# Patient Record
Sex: Female | Born: 1940
Health system: Southern US, Community
[De-identification: ages and names within clinical notes are randomized; demographics above are authoritative.]

## PROBLEM LIST (undated history)

## (undated) DIAGNOSIS — F419 Anxiety disorder, unspecified: Secondary | ICD-10-CM

## (undated) DIAGNOSIS — M1990 Unspecified osteoarthritis, unspecified site: Secondary | ICD-10-CM

## (undated) DIAGNOSIS — K5790 Diverticulosis of intestine, part unspecified, without perforation or abscess without bleeding: Secondary | ICD-10-CM

## (undated) DIAGNOSIS — Z87442 Personal history of urinary calculi: Secondary | ICD-10-CM

## (undated) DIAGNOSIS — C801 Malignant (primary) neoplasm, unspecified: Secondary | ICD-10-CM

## (undated) DIAGNOSIS — J45909 Unspecified asthma, uncomplicated: Secondary | ICD-10-CM

## (undated) DIAGNOSIS — E785 Hyperlipidemia, unspecified: Secondary | ICD-10-CM

## (undated) DIAGNOSIS — K219 Gastro-esophageal reflux disease without esophagitis: Secondary | ICD-10-CM

## (undated) DIAGNOSIS — R7303 Prediabetes: Secondary | ICD-10-CM

## (undated) DIAGNOSIS — I10 Essential (primary) hypertension: Secondary | ICD-10-CM

## (undated) HISTORY — PX: CATARACT EXTRACTION W/ INTRAOCULAR LENS IMPLANT: SHX1309

## (undated) HISTORY — PX: TONSILLECTOMY: SUR1361

---

## 1985-10-16 HISTORY — PX: TUBAL LIGATION: SHX77

## 1998-10-19 HISTORY — PX: EYE SURGERY: SHX253

## 1999-01-11 HISTORY — PX: SIGMOIDOSCOPY: SUR1295

## 1999-08-14 ENCOUNTER — Encounter: Admission: RE | Admit: 1999-08-14 | Discharge: 1999-08-14 | Payer: Self-pay | Admitting: Unknown Physician Specialty

## 1999-08-14 ENCOUNTER — Encounter: Payer: Self-pay | Admitting: Unknown Physician Specialty

## 2000-08-26 ENCOUNTER — Encounter: Admission: RE | Admit: 2000-08-26 | Discharge: 2000-08-26 | Payer: Self-pay | Admitting: Unknown Physician Specialty

## 2000-08-26 ENCOUNTER — Encounter: Payer: Self-pay | Admitting: Unknown Physician Specialty

## 2001-08-26 ENCOUNTER — Encounter: Payer: Self-pay | Admitting: Unknown Physician Specialty

## 2001-08-26 ENCOUNTER — Encounter: Admission: RE | Admit: 2001-08-26 | Discharge: 2001-08-26 | Payer: Self-pay | Admitting: Unknown Physician Specialty

## 2004-03-23 ENCOUNTER — Other Ambulatory Visit: Admission: RE | Admit: 2004-03-23 | Discharge: 2004-03-23 | Payer: Self-pay | Admitting: Family Medicine

## 2005-03-28 ENCOUNTER — Other Ambulatory Visit: Admission: RE | Admit: 2005-03-28 | Discharge: 2005-03-28 | Payer: Self-pay | Admitting: Family Medicine

## 2006-06-07 ENCOUNTER — Other Ambulatory Visit: Admission: RE | Admit: 2006-06-07 | Discharge: 2006-06-07 | Payer: Self-pay | Admitting: Family Medicine

## 2010-12-25 HISTORY — PX: TRIGGER FINGER RELEASE: SHX641

## 2011-09-18 DIAGNOSIS — L821 Other seborrheic keratosis: Secondary | ICD-10-CM | POA: Diagnosis not present

## 2011-09-18 DIAGNOSIS — L570 Actinic keratosis: Secondary | ICD-10-CM | POA: Diagnosis not present

## 2011-12-11 DIAGNOSIS — N6489 Other specified disorders of breast: Secondary | ICD-10-CM | POA: Diagnosis not present

## 2011-12-11 DIAGNOSIS — Z1231 Encounter for screening mammogram for malignant neoplasm of breast: Secondary | ICD-10-CM | POA: Diagnosis not present

## 2011-12-12 DIAGNOSIS — N6489 Other specified disorders of breast: Secondary | ICD-10-CM | POA: Diagnosis not present

## 2011-12-12 DIAGNOSIS — Z1231 Encounter for screening mammogram for malignant neoplasm of breast: Secondary | ICD-10-CM | POA: Diagnosis not present

## 2011-12-18 DIAGNOSIS — B078 Other viral warts: Secondary | ICD-10-CM | POA: Diagnosis not present

## 2011-12-18 DIAGNOSIS — L819 Disorder of pigmentation, unspecified: Secondary | ICD-10-CM | POA: Diagnosis not present

## 2011-12-18 DIAGNOSIS — L578 Other skin changes due to chronic exposure to nonionizing radiation: Secondary | ICD-10-CM | POA: Diagnosis not present

## 2011-12-18 DIAGNOSIS — L821 Other seborrheic keratosis: Secondary | ICD-10-CM | POA: Diagnosis not present

## 2012-02-05 DIAGNOSIS — J309 Allergic rhinitis, unspecified: Secondary | ICD-10-CM | POA: Diagnosis not present

## 2012-02-05 DIAGNOSIS — M810 Age-related osteoporosis without current pathological fracture: Secondary | ICD-10-CM | POA: Diagnosis not present

## 2012-02-05 DIAGNOSIS — R143 Flatulence: Secondary | ICD-10-CM | POA: Diagnosis not present

## 2012-02-05 DIAGNOSIS — I10 Essential (primary) hypertension: Secondary | ICD-10-CM | POA: Diagnosis not present

## 2012-02-05 DIAGNOSIS — R141 Gas pain: Secondary | ICD-10-CM | POA: Diagnosis not present

## 2012-02-05 DIAGNOSIS — E559 Vitamin D deficiency, unspecified: Secondary | ICD-10-CM | POA: Diagnosis not present

## 2012-02-05 DIAGNOSIS — Z79899 Other long term (current) drug therapy: Secondary | ICD-10-CM | POA: Diagnosis not present

## 2012-02-05 DIAGNOSIS — Z0000 Encounter for general adult medical examination without abnormal findings: Secondary | ICD-10-CM | POA: Diagnosis not present

## 2012-02-05 DIAGNOSIS — E785 Hyperlipidemia, unspecified: Secondary | ICD-10-CM | POA: Diagnosis not present

## 2012-03-06 HISTORY — PX: COLONOSCOPY: SHX174

## 2012-03-11 DIAGNOSIS — M810 Age-related osteoporosis without current pathological fracture: Secondary | ICD-10-CM | POA: Diagnosis not present

## 2012-04-02 DIAGNOSIS — K5730 Diverticulosis of large intestine without perforation or abscess without bleeding: Secondary | ICD-10-CM | POA: Diagnosis not present

## 2012-04-02 DIAGNOSIS — Z1211 Encounter for screening for malignant neoplasm of colon: Secondary | ICD-10-CM | POA: Diagnosis not present

## 2012-04-28 DIAGNOSIS — Z23 Encounter for immunization: Secondary | ICD-10-CM | POA: Diagnosis not present

## 2012-06-03 DIAGNOSIS — M25529 Pain in unspecified elbow: Secondary | ICD-10-CM | POA: Diagnosis not present

## 2012-06-03 DIAGNOSIS — M6530 Trigger finger, unspecified finger: Secondary | ICD-10-CM | POA: Diagnosis not present

## 2012-06-06 HISTORY — PX: BREAST BIOPSY: SHX20

## 2012-06-10 DIAGNOSIS — Z09 Encounter for follow-up examination after completed treatment for conditions other than malignant neoplasm: Secondary | ICD-10-CM | POA: Diagnosis not present

## 2012-06-10 DIAGNOSIS — N63 Unspecified lump in unspecified breast: Secondary | ICD-10-CM | POA: Diagnosis not present

## 2012-06-10 DIAGNOSIS — Z0189 Encounter for other specified special examinations: Secondary | ICD-10-CM | POA: Diagnosis not present

## 2012-06-11 ENCOUNTER — Other Ambulatory Visit: Payer: Self-pay | Admitting: Radiology

## 2012-06-11 DIAGNOSIS — Z09 Encounter for follow-up examination after completed treatment for conditions other than malignant neoplasm: Secondary | ICD-10-CM | POA: Diagnosis not present

## 2012-06-11 DIAGNOSIS — N63 Unspecified lump in unspecified breast: Secondary | ICD-10-CM | POA: Diagnosis not present

## 2012-06-11 DIAGNOSIS — Z0189 Encounter for other specified special examinations: Secondary | ICD-10-CM | POA: Diagnosis not present

## 2012-06-11 DIAGNOSIS — N6019 Diffuse cystic mastopathy of unspecified breast: Secondary | ICD-10-CM | POA: Diagnosis not present

## 2012-06-24 DIAGNOSIS — L821 Other seborrheic keratosis: Secondary | ICD-10-CM | POA: Diagnosis not present

## 2012-06-24 DIAGNOSIS — L820 Inflamed seborrheic keratosis: Secondary | ICD-10-CM | POA: Diagnosis not present

## 2012-06-24 DIAGNOSIS — L819 Disorder of pigmentation, unspecified: Secondary | ICD-10-CM | POA: Diagnosis not present

## 2012-06-24 DIAGNOSIS — L578 Other skin changes due to chronic exposure to nonionizing radiation: Secondary | ICD-10-CM | POA: Diagnosis not present

## 2012-08-05 DIAGNOSIS — M7710 Lateral epicondylitis, unspecified elbow: Secondary | ICD-10-CM | POA: Diagnosis not present

## 2012-08-12 DIAGNOSIS — L570 Actinic keratosis: Secondary | ICD-10-CM | POA: Diagnosis not present

## 2012-08-26 DIAGNOSIS — L570 Actinic keratosis: Secondary | ICD-10-CM | POA: Diagnosis not present

## 2012-09-09 DIAGNOSIS — L578 Other skin changes due to chronic exposure to nonionizing radiation: Secondary | ICD-10-CM | POA: Diagnosis not present

## 2012-09-09 DIAGNOSIS — D239 Other benign neoplasm of skin, unspecified: Secondary | ICD-10-CM | POA: Diagnosis not present

## 2012-09-09 DIAGNOSIS — D485 Neoplasm of uncertain behavior of skin: Secondary | ICD-10-CM | POA: Diagnosis not present

## 2012-09-09 DIAGNOSIS — L570 Actinic keratosis: Secondary | ICD-10-CM | POA: Diagnosis not present

## 2012-09-09 DIAGNOSIS — D1801 Hemangioma of skin and subcutaneous tissue: Secondary | ICD-10-CM | POA: Diagnosis not present

## 2012-09-14 ENCOUNTER — Encounter (HOSPITAL_COMMUNITY): Payer: Self-pay | Admitting: Emergency Medicine

## 2012-09-14 ENCOUNTER — Emergency Department (HOSPITAL_COMMUNITY): Payer: Medicare Other

## 2012-09-14 ENCOUNTER — Inpatient Hospital Stay (HOSPITAL_COMMUNITY)
Admission: EM | Admit: 2012-09-14 | Discharge: 2012-09-27 | DRG: 329 | Disposition: A | Discharge status: Home Health | Payer: Medicare Other | Attending: Surgery | Admitting: Surgery

## 2012-09-14 DIAGNOSIS — Z433 Encounter for attention to colostomy: Secondary | ICD-10-CM | POA: Diagnosis not present

## 2012-09-14 DIAGNOSIS — K5730 Diverticulosis of large intestine without perforation or abscess without bleeding: Secondary | ICD-10-CM | POA: Diagnosis not present

## 2012-09-14 DIAGNOSIS — R112 Nausea with vomiting, unspecified: Secondary | ICD-10-CM | POA: Diagnosis present

## 2012-09-14 DIAGNOSIS — D175 Benign lipomatous neoplasm of intra-abdominal organs: Secondary | ICD-10-CM | POA: Diagnosis not present

## 2012-09-14 DIAGNOSIS — K651 Peritoneal abscess: Secondary | ICD-10-CM | POA: Diagnosis not present

## 2012-09-14 DIAGNOSIS — Z79899 Other long term (current) drug therapy: Secondary | ICD-10-CM

## 2012-09-14 DIAGNOSIS — K631 Perforation of intestine (nontraumatic): Secondary | ICD-10-CM | POA: Diagnosis not present

## 2012-09-14 DIAGNOSIS — E785 Hyperlipidemia, unspecified: Secondary | ICD-10-CM | POA: Diagnosis not present

## 2012-09-14 DIAGNOSIS — I10 Essential (primary) hypertension: Secondary | ICD-10-CM | POA: Diagnosis present

## 2012-09-14 DIAGNOSIS — K2970 Gastritis, unspecified, without bleeding: Secondary | ICD-10-CM | POA: Diagnosis not present

## 2012-09-14 DIAGNOSIS — K6389 Other specified diseases of intestine: Secondary | ICD-10-CM

## 2012-09-14 DIAGNOSIS — R109 Unspecified abdominal pain: Secondary | ICD-10-CM | POA: Diagnosis not present

## 2012-09-14 DIAGNOSIS — K650 Generalized (acute) peritonitis: Secondary | ICD-10-CM | POA: Diagnosis present

## 2012-09-14 DIAGNOSIS — R9389 Abnormal findings on diagnostic imaging of other specified body structures: Secondary | ICD-10-CM | POA: Diagnosis present

## 2012-09-14 DIAGNOSIS — E875 Hyperkalemia: Secondary | ICD-10-CM | POA: Diagnosis not present

## 2012-09-14 DIAGNOSIS — K630 Abscess of intestine: Secondary | ICD-10-CM | POA: Diagnosis not present

## 2012-09-14 DIAGNOSIS — K5732 Diverticulitis of large intestine without perforation or abscess without bleeding: Secondary | ICD-10-CM | POA: Diagnosis not present

## 2012-09-14 DIAGNOSIS — R5381 Other malaise: Secondary | ICD-10-CM | POA: Diagnosis not present

## 2012-09-14 DIAGNOSIS — Z4801 Encounter for change or removal of surgical wound dressing: Secondary | ICD-10-CM | POA: Diagnosis not present

## 2012-09-14 DIAGNOSIS — R935 Abnormal findings on diagnostic imaging of other abdominal regions, including retroperitoneum: Secondary | ICD-10-CM | POA: Diagnosis not present

## 2012-09-14 DIAGNOSIS — L03319 Cellulitis of trunk, unspecified: Secondary | ICD-10-CM | POA: Diagnosis not present

## 2012-09-14 DIAGNOSIS — R1084 Generalized abdominal pain: Secondary | ICD-10-CM | POA: Diagnosis not present

## 2012-09-14 DIAGNOSIS — K56609 Unspecified intestinal obstruction, unspecified as to partial versus complete obstruction: Secondary | ICD-10-CM | POA: Diagnosis not present

## 2012-09-14 DIAGNOSIS — K5792 Diverticulitis of intestine, part unspecified, without perforation or abscess without bleeding: Secondary | ICD-10-CM | POA: Diagnosis present

## 2012-09-14 DIAGNOSIS — E669 Obesity, unspecified: Secondary | ICD-10-CM | POA: Diagnosis not present

## 2012-09-14 DIAGNOSIS — K639 Disease of intestine, unspecified: Secondary | ICD-10-CM | POA: Diagnosis present

## 2012-09-14 DIAGNOSIS — G8928 Other chronic postprocedural pain: Secondary | ICD-10-CM | POA: Diagnosis not present

## 2012-09-14 HISTORY — DX: Hyperlipidemia, unspecified: E78.5

## 2012-09-14 HISTORY — DX: Diverticulosis of intestine, part unspecified, without perforation or abscess without bleeding: K57.90

## 2012-09-14 HISTORY — DX: Essential (primary) hypertension: I10

## 2012-09-14 LAB — CBC WITH DIFFERENTIAL/PLATELET
Eosinophils Relative: 0 % (ref 0–5)
HCT: 41 % (ref 36.0–46.0)
Lymphocytes Relative: 6 % — ABNORMAL LOW (ref 12–46)
Lymphs Abs: 0.9 10*3/uL (ref 0.7–4.0)
MCH: 31.7 pg (ref 26.0–34.0)
MCV: 92.1 fL (ref 78.0–100.0)
Monocytes Absolute: 0.9 10*3/uL (ref 0.1–1.0)
RBC: 4.45 MIL/uL (ref 3.87–5.11)
RDW: 12.8 % (ref 11.5–15.5)
WBC: 15.1 10*3/uL — ABNORMAL HIGH (ref 4.0–10.5)

## 2012-09-14 LAB — COMPREHENSIVE METABOLIC PANEL
BUN: 15 mg/dL (ref 6–23)
CO2: 22 mEq/L (ref 19–32)
Chloride: 102 mEq/L (ref 96–112)
Creatinine, Ser: 0.77 mg/dL (ref 0.50–1.10)
GFR calc non Af Amer: 83 mL/min — ABNORMAL LOW (ref >90)
Glucose, Bld: 161 mg/dL — ABNORMAL HIGH (ref 70–99)
Total Bilirubin: 0.5 mg/dL (ref 0.3–1.2)

## 2012-09-14 LAB — URINALYSIS, ROUTINE W REFLEX MICROSCOPIC
Bilirubin Urine: NEGATIVE
Glucose, UA: NEGATIVE mg/dL
Ketones, ur: 15 mg/dL — AB
pH: 8 (ref 5.0–8.0)

## 2012-09-14 LAB — URINALYSIS, MICROSCOPIC ONLY
Glucose, UA: NEGATIVE mg/dL
Ketones, ur: 15 mg/dL — AB
Leukocytes, UA: NEGATIVE
Nitrite: NEGATIVE
Protein, ur: NEGATIVE mg/dL

## 2012-09-14 LAB — LIPASE, BLOOD: Lipase: 20 U/L (ref 11–59)

## 2012-09-14 MED ORDER — ONDANSETRON HCL 4 MG/2ML IJ SOLN
4.0000 mg | Freq: Once | INTRAMUSCULAR | Status: AC
Start: 2012-09-14 — End: 2012-09-14
  Administered 2012-09-14: 4 mg via INTRAVENOUS
  Filled 2012-09-14: qty 2

## 2012-09-14 MED ORDER — ALPRAZOLAM 0.25 MG PO TABS
0.1250 mg | ORAL_TABLET | Freq: Every day | ORAL | Status: DC | PRN
Start: 2012-09-14 — End: 2012-09-22
  Administered 2012-09-21 – 2012-09-22 (×2): 0.25 mg via ORAL
  Filled 2012-09-14 (×2): qty 1

## 2012-09-14 MED ORDER — ALUM & MAG HYDROXIDE-SIMETH 200-200-20 MG/5ML PO SUSP
30.0000 mL | Freq: Four times a day (QID) | ORAL | Status: DC | PRN
Start: 2012-09-14 — End: 2012-09-27
  Administered 2012-09-15 – 2012-09-16 (×3): 30 mL via ORAL
  Filled 2012-09-14 (×3): qty 30

## 2012-09-14 MED ORDER — ACETAMINOPHEN 650 MG RE SUPP: 650.0000 mg | Freq: Four times a day (QID) | RECTAL | Status: DC | PRN

## 2012-09-14 MED ORDER — AMLODIPINE BESYLATE 5 MG PO TABS
5.0000 mg | ORAL_TABLET | Freq: Every morning | ORAL | Status: DC
  Administered 2012-09-15 – 2012-09-16 (×2): 5 mg via ORAL
  Filled 2012-09-14 (×3): qty 1

## 2012-09-14 MED ORDER — LIP MEDEX EX OINT
TOPICAL_OINTMENT | CUTANEOUS | Status: DC | PRN
  Filled 2012-09-14 (×3): qty 7

## 2012-09-14 MED ORDER — IOHEXOL 300 MG/ML  SOLN
50.0000 mL | Freq: Once | INTRAMUSCULAR | Status: AC | PRN
  Administered 2012-09-14: 50 mL via ORAL

## 2012-09-14 MED ORDER — ADULT MULTIVITAMIN W/MINERALS CH
1.0000 | ORAL_TABLET | Freq: Every morning | ORAL | Status: DC
  Administered 2012-09-15 – 2012-09-16 (×2): 1 via ORAL
  Filled 2012-09-14 (×3): qty 1

## 2012-09-14 MED ORDER — SODIUM CHLORIDE 0.9 % IV SOLN
Freq: Once | INTRAVENOUS | Status: AC
  Administered 2012-09-14: 20:00:00 via INTRAVENOUS

## 2012-09-14 MED ORDER — ONDANSETRON HCL 4 MG PO TABS: 4.0000 mg | ORAL_TABLET | Freq: Four times a day (QID) | ORAL | Status: DC | PRN

## 2012-09-14 MED ORDER — ONDANSETRON HCL 4 MG/2ML IJ SOLN
4.0000 mg | Freq: Four times a day (QID) | INTRAMUSCULAR | Status: DC | PRN
  Administered 2012-09-15 – 2012-09-16 (×4): 4 mg via INTRAVENOUS
  Filled 2012-09-14 (×3): qty 2

## 2012-09-14 MED ORDER — HEPARIN SODIUM (PORCINE) 5000 UNIT/ML IJ SOLN
5000.0000 [IU] | Freq: Three times a day (TID) | INTRAMUSCULAR | Status: DC
  Administered 2012-09-14 – 2012-09-19 (×15): 5000 [IU] via SUBCUTANEOUS
  Filled 2012-09-14 (×17): qty 1

## 2012-09-14 MED ORDER — OXYCODONE HCL 5 MG PO TABS: 5.0000 mg | ORAL_TABLET | ORAL | Status: DC | PRN

## 2012-09-14 MED ORDER — SIMVASTATIN 20 MG PO TABS
20.0000 mg | ORAL_TABLET | Freq: Every evening | ORAL | Status: DC
  Administered 2012-09-14 – 2012-09-16 (×3): 20 mg via ORAL
  Filled 2012-09-14 (×4): qty 1

## 2012-09-14 MED ORDER — CIPROFLOXACIN IN D5W 400 MG/200ML IV SOLN
400.0000 mg | Freq: Two times a day (BID) | INTRAVENOUS | Status: DC
  Administered 2012-09-14 – 2012-09-16 (×4): 400 mg via INTRAVENOUS
  Filled 2012-09-14 (×6): qty 200

## 2012-09-14 MED ORDER — ASPIRIN EC 81 MG PO TBEC
81.0000 mg | DELAYED_RELEASE_TABLET | Freq: Every morning | ORAL | Status: DC
  Administered 2012-09-15 – 2012-09-16 (×2): 81 mg via ORAL
  Filled 2012-09-14 (×3): qty 1

## 2012-09-14 MED ORDER — ONDANSETRON HCL 4 MG/2ML IJ SOLN
4.0000 mg | Freq: Four times a day (QID) | INTRAMUSCULAR | Status: DC | PRN
  Administered 2012-09-20 – 2012-09-25 (×3): 4 mg via INTRAVENOUS
  Filled 2012-09-14 (×12): qty 2

## 2012-09-14 MED ORDER — HYDROMORPHONE HCL PF 1 MG/ML IJ SOLN
1.0000 mg | INTRAMUSCULAR | Status: DC | PRN
  Administered 2012-09-14 – 2012-09-17 (×13): 1 mg via INTRAVENOUS
  Filled 2012-09-14 (×13): qty 1

## 2012-09-14 MED ORDER — ACETAMINOPHEN 325 MG PO TABS
650.0000 mg | ORAL_TABLET | Freq: Four times a day (QID) | ORAL | Status: DC | PRN
  Administered 2012-09-21 – 2012-09-25 (×3): 650 mg via ORAL
  Filled 2012-09-14 (×3): qty 2

## 2012-09-14 MED ORDER — FENTANYL CITRATE 0.05 MG/ML IJ SOLN
50.0000 ug | Freq: Once | INTRAMUSCULAR | Status: AC
  Administered 2012-09-14: 50 ug via INTRAVENOUS
  Filled 2012-09-14: qty 2

## 2012-09-14 MED ORDER — IOHEXOL 300 MG/ML  SOLN
100.0000 mL | Freq: Once | INTRAMUSCULAR | Status: AC | PRN
  Administered 2012-09-14: 100 mL via INTRAVENOUS

## 2012-09-14 MED ORDER — MORPHINE SULFATE 2 MG/ML IJ SOLN
1.0000 mg | INTRAMUSCULAR | Status: DC | PRN
  Administered 2012-09-15 – 2012-09-19 (×2): 2 mg via INTRAVENOUS
  Filled 2012-09-14 (×2): qty 1

## 2012-09-14 MED ORDER — METRONIDAZOLE IN NACL 5-0.79 MG/ML-% IV SOLN
500.0000 mg | Freq: Three times a day (TID) | INTRAVENOUS | Status: DC
  Administered 2012-09-14 – 2012-09-16 (×6): 500 mg via INTRAVENOUS
  Filled 2012-09-14 (×7): qty 100

## 2012-09-14 MED ORDER — SODIUM CHLORIDE 0.9 % IV BOLUS (SEPSIS)
1000.0000 mL | Freq: Once | INTRAVENOUS | Status: AC
  Administered 2012-09-14: 1000 mL via INTRAVENOUS

## 2012-09-14 MED ORDER — RISAQUAD PO CAPS
1.0000 | ORAL_CAPSULE | Freq: Every evening | ORAL | Status: DC
  Administered 2012-09-14 – 2012-09-16 (×3): 1 via ORAL
  Filled 2012-09-14 (×4): qty 1

## 2012-09-14 MED ORDER — POTASSIUM CHLORIDE IN NACL 20-0.9 MEQ/L-% IV SOLN
INTRAVENOUS | Status: DC
  Administered 2012-09-14: 22:00:00 via INTRAVENOUS
  Administered 2012-09-15: 75 mL/h via INTRAVENOUS
  Filled 2012-09-14 (×3): qty 1000

## 2012-09-14 MED ORDER — HYDROXYZINE HCL 25 MG PO TABS
25.0000 mg | ORAL_TABLET | Freq: Every day | ORAL | Status: DC
  Administered 2012-09-14 – 2012-09-18 (×3): 25 mg via ORAL
  Filled 2012-09-14 (×7): qty 1

## 2012-09-14 MED ORDER — POTASSIUM CHLORIDE 20 MEQ/15ML (10%) PO LIQD
60.0000 meq | Freq: Every day | ORAL | Status: DC
  Administered 2012-09-15: 60 meq via ORAL
  Filled 2012-09-14 (×2): qty 45

## 2012-09-14 NOTE — ED Provider Notes (Signed)
History     CSN: [id number]  Arrival date & time 09/14/12  1418   First MD Initiated Contact with Patient 09/14/12 1504      Chief Complaint  Patient presents with  . Abdominal Pain  . Emesis    (Consider location/radiation/quality/duration/timing/severity/associated sxs/prior treatment) HPI  Past Medical History  Diagnosis Date  . Diverticulosis   . Hypertension   . Hyperlipemia     Past Surgical History  Procedure Laterality Date  . Tubal ligation    . Tonsillectomy      No family history on file.  History  Substance Use Topics  . Smoking status: Never Smoker   . Smokeless tobacco: Never Used  . Alcohol Use: No    OB History   Grav Para Term Preterm Abortions TAB SAB Ect Mult Living                  Review of Systems  Allergies  Codeine; Fosamax; Latex; and Vioxx  Home Medications   Current Outpatient Rx  Name  Route  Sig  Dispense  Refill  . acidophilus (RISAQUAD) CAPS   Oral   Take 1 capsule by mouth every evening.         Marland Kitchen ALPRAZolam (XANAX) 0.25 MG tablet   Oral   Take 0.125-0.25 mg by mouth daily as needed for sleep or anxiety.         Marland Kitchen amLODipine (NORVASC) 5 MG tablet   Oral   Take 5 mg by mouth every morning.         . Ascorbic Acid (VITAMIN C) 1000 MG tablet   Oral   Take 1,000 mg by mouth every morning.         Marland Kitchen aspirin EC 81 MG tablet   Oral   Take 81 mg by mouth every morning.         . calcium-vitamin D (OSCAL WITH D) 500-200 MG-UNIT per tablet   Oral   Take 1 tablet by mouth 2 (two) times daily.         . cholecalciferol (VITAMIN D) 1000 UNITS tablet   Oral   Take 2,000 Units by mouth every morning.         . hydrOXYzine (ATARAX/VISTARIL) 25 MG tablet   Oral   Take 25 mg by mouth at bedtime.         . Multiple Vitamin (MULTIVITAMIN WITH MINERALS) TABS   Oral   Take 1 tablet by mouth every morning.         . pyridoxine (B-6) 200 MG tablet   Oral   Take 200 mg by mouth every morning.          . simvastatin (ZOCOR) 20 MG tablet   Oral   Take 20 mg by mouth every evening.           BP 153/60  Pulse 99  Temp(Src) 99.8 F (37.7 C) (Oral)  Resp 24  SpO2 97%  Physical Exam  ED Course  BLADDER CATHETERIZATION Date/Time: 09/14/2012 7:50 PM Performed by: Gerhard Munch Authorized by: Gerhard Munch Consent: Verbal consent obtained. The procedure was performed in an emergent situation. Risks and benefits: risks, benefits and alternatives were discussed Consent given by: patient Patient understanding: patient states understanding of the procedure being performed Patient consent: the patient's understanding of the procedure matches consent given Procedure consent: procedure consent matches procedure scheduled Relevant documents: relevant documents present and verified Test results: test results available and properly labeled Site marked: the operative  site was marked Imaging studies: imaging studies available Patient identity confirmed: verbally with patient Time out: Immediately prior to procedure a "time out" was called to verify the correct patient, procedure, equipment, support staff and site/side marked as required. Indications: urine output monitoring and urine specimen collection Local anesthesia used: no Patient sedated: no Preparation: Patient was prepped and draped in the usual sterile fashion. Catheter insertion: temporary indwelling Catheter size: 16 Fr Complicated insertion: no Altered anatomy: no Bladder irrigation: no Number of attempts: 1 Urine characteristics: yellow Patient tolerance: Patient tolerated the procedure well with no immediate complications.   (including critical care time)  Labs Reviewed  CBC WITH DIFFERENTIAL - Abnormal; Notable for the following:    WBC 15.1 (*)    Neutrophils Relative 88 (*)    Neutro Abs 13.2 (*)    Lymphocytes Relative 6 (*)    All other components within normal limits  URINALYSIS, ROUTINE W REFLEX  MICROSCOPIC - Abnormal; Notable for the following:    APPearance CLOUDY (*)    Ketones, ur 15 (*)    All other components within normal limits  COMPREHENSIVE METABOLIC PANEL - Abnormal; Notable for the following:    Potassium 3.3 (*)    Glucose, Bld 161 (*)    GFR calc non Af Amer 83 (*)    All other components within normal limits  LIPASE, BLOOD  URINALYSIS, MICROSCOPIC ONLY   Ct Abdomen Pelvis W Contrast  09/14/2012  *RADIOLOGY REPORT*  Clinical Data: Abdominal pain and cramping, vomiting  CT ABDOMEN AND PELVIS WITH CONTRAST  Technique:  Multidetector CT imaging of the abdomen and pelvis was performed following the standard protocol during bolus administration of intravenous contrast.  Contrast: OMNIPAQUE IOHEXOL 300 MG/ML  SOLN, 50mL OMNIPAQUE IOHEXOL 300 MG/ML  SOLN  Comparison: None.  Findings: The lung bases are clear.  The liver appears somewhat low in attenuation and fatty infiltration is a consideration.  No calcified gallstones are seen.  The pancreas is normal in size and the pancreatic duct is not dilated.  The adrenal glands and spleen are unremarkable, and there is a small splenule present.  The kidneys enhance with no calculus or mass and on delayed images the pelvocaliceal systems are unremarkable.  The abdominal aorta is normal in caliber.  No adenopathy is seen. Slightly prominent loops of small bowel are noted proximally, with decompression of the distal ileal small bowel loops.  A partial small bowel obstruction is difficult to exclude and follow up plain films recommended if symptoms persist.  No definite point of obstruction is seen.  Within the urinary bladder to the right of midline posteriorly there is a 10 mm area of increased attenuation measuring 68 HU. However, delayed images of the bladder were performed and this area does not persist.  This may represent a small amount of contrast collecting in the posterior bladder, with this area being very near the right UV  junction.  However if there is any suspicion of bladder lesion then cystoscopy would be recommended.  The uterus is anteverted and normal in size.  No adnexal lesion is seen.  There is a moderate amount of free fluid in the pelvis. There is some thickening of the mucosa of the rectosigmoid colon which may be due to diverticulosis.  Underlying neoplasm of the sigmoid colon cannot be excluded.  Has the patient undergone routine colonoscopy?  There are multiple diverticula scattered throughout the colon but primarily within the within the descending and rectosigmoid colon. The terminal ileum  and the appendix are unremarkable.  There is a probable lipoma within the right iliac fossa surrounding the iliopsoas musculature and pushing the iliac vasculature medially.  IMPRESSION:  1.  Slightly prominent small bowel loops proximally with decompression of the distal small bowel.  Cannot exclude a partial small bowel obstruction although no point of obstruction could be determined.  Consider follow-up plain films if symptoms persist or worsen.  2.  Small focus of higher attenuation in the posterior right urinary bladder may represent contrast collecting posteriorly although a bladder lesion cannot be excluded.  Correlate clinically. 3. Thickening of the mucosa of the rectosigmoid colon may be due to diverticulosis, but an underlying neoplasm cannot be excluded. 4.  Descending and rectosigmoid colon diverticula. 5.  Lipoma within the right iliac fossa as described above. 6.  Question fatty infiltration of the liver. 7.  Small amount of free fluid within the pelvis.   Original Report Authenticated By: Dwyane Dee, M.D.      No diagnosis found.  Update: Patient continues to be in pain.  I discussed all results with the patient and her husband.  Given the persistency of the patient's urination, she'll have a Foley catheter placed.  Additionally, the patient continued to have nausea, no vomiting.   MDM  This patient  presents with abdominal pain, nausea, vomiting, anorexia, and I exam is very uncomfortable, though with stable vital signs.  The patient's risk factors of prior tubal ligation for bowel obstruction.  She has a history of diverticulosis, but no history of carcinoma.  Given the patient's pain she had CT scan.  As demonstrated questionable small bowel traction with other abnormalities.  Given the persistency of her pain, she was admitted for further evaluation and management.       Gerhard Munch, MD 09/14/12 2021

## 2012-09-14 NOTE — ED Notes (Signed)
Per EMS - Pt woke w/ abdominal pain at 0600, just not "feeling right". Progressed by 1000 to cramping and escalating pain 10/10, writhing, wenching, and wailing. Onset of chills w/o fever, nausea w/ emesis, no diarrhea. Last normal BM this a.m. Lower right and left quadrant pain w/o radiation. Last ate ham biscuit this morning after waking. #20 LAC, received Zofran 4 mg IV en route. BP - 158/78, HR - 90, reg, NSR, Resp - 22, O2 Sat 100 on RA. Temp 96.4. Endorses hx diverticulosis but never w/ pain this intense.

## 2012-09-14 NOTE — ED Provider Notes (Signed)
History     CSN: [id number]  Arrival date & time 09/14/12  1418   First MD Initiated Contact with Patient 09/14/12 1504      Chief Complaint  Patient presents with  . Abdominal Pain  . Emesis    (Consider location/radiation/quality/duration/timing/severity/associated sxs/prior treatment) HPI Patient p/w diffuse crampy / sharp abd pain with n/v/d. Last night she was in her [facility].  Onset was ~6hr pta.  Since onset Sx have been worsening w multiple episodes of nb/nb emesis and increasingly loose stool. No relief w anything, including xanax. No clear exacerbating factors. +chills, no fever. Hx of lap tubal ligation. Hx of mult similar episodes in the past year. Last colonoscopy 8/13 w diverticulosis.  Past Medical History  Diagnosis Date  . Diverticulosis   . Hypertension   . Hyperlipemia     Past Surgical History  Procedure Laterality Date  . Tubal ligation    . Tonsillectomy      No family history on file.  History  Substance Use Topics  . Smoking status: Never Smoker   . Smokeless tobacco: Never Used  . Alcohol Use: No    OB History   Grav Para Term Preterm Abortions TAB SAB Ect Mult Living                  Review of Systems  Constitutional:       Per HPI, otherwise negative  HENT:       Per HPI, otherwise negative  Respiratory:       Per HPI, otherwise negative  Cardiovascular:       Per HPI, otherwise negative  Gastrointestinal: Positive for nausea, vomiting, abdominal pain and diarrhea. Negative for constipation, blood in stool, abdominal distention, anal bleeding and rectal pain.  Endocrine:       Negative aside from HPI  Genitourinary:       Neg aside from HPI   Musculoskeletal:       Per HPI, otherwise negative  Skin: Negative.   Neurological: Negative for syncope.    Allergies  Codeine; Fosamax; Latex; and Vioxx  Home Medications   Current Outpatient Rx  Name  Route  Sig  Dispense  Refill  . acidophilus (RISAQUAD) CAPS   Oral  Take 1 capsule by mouth every evening.         Marland Kitchen ALPRAZolam (XANAX) 0.25 MG tablet   Oral   Take 0.125-0.25 mg by mouth daily as needed for sleep or anxiety.         Marland Kitchen amLODipine (NORVASC) 5 MG tablet   Oral   Take 5 mg by mouth every morning.         . Ascorbic Acid (VITAMIN C) 1000 MG tablet   Oral   Take 1,000 mg by mouth every morning.         Marland Kitchen aspirin EC 81 MG tablet   Oral   Take 81 mg by mouth every morning.         . calcium-vitamin D (OSCAL WITH D) 500-200 MG-UNIT per tablet   Oral   Take 1 tablet by mouth 2 (two) times daily.         . cholecalciferol (VITAMIN D) 1000 UNITS tablet   Oral   Take 2,000 Units by mouth every morning.         . hydrOXYzine (ATARAX/VISTARIL) 25 MG tablet   Oral   Take 25 mg by mouth at bedtime.         . Multiple Vitamin (MULTIVITAMIN  WITH MINERALS) TABS   Oral   Take 1 tablet by mouth every morning.         . pyridoxine (B-6) 200 MG tablet   Oral   Take 200 mg by mouth every morning.         . simvastatin (ZOCOR) 20 MG tablet   Oral   Take 20 mg by mouth every evening.           BP 111/64  Pulse 78  Temp(Src) 99.8 F (37.7 C) (Oral)  Resp 24  SpO2 100%  Physical Exam  Nursing note and vitals reviewed. Constitutional: She is oriented to person, place, and time. She appears well-developed and well-nourished. No distress.  HENT:  Head: Normocephalic and atraumatic.  Mouth/Throat: Mucous membranes are not pale and dry.  Eyes: Conjunctivae and EOM are normal.  Cardiovascular: Normal rate and regular rhythm.   Pulmonary/Chest: Effort normal and breath sounds normal. No stridor. No respiratory distress.  Abdominal: Soft. Normal appearance. There is no hepatosplenomegaly. There is generalized tenderness. There is guarding. There is no rigidity, no rebound and no CVA tenderness.  Musculoskeletal: She exhibits no edema.  Neurological: She is alert and oriented to person, place, and time. No cranial nerve  deficit.  Skin: Skin is warm and dry.  Psychiatric: She has a normal mood and affect.    ED Course  Procedures (including critical care time)  Labs Reviewed  CBC WITH DIFFERENTIAL  URINALYSIS, ROUTINE W REFLEX MICROSCOPIC  COMPREHENSIVE METABOLIC PANEL  LIPASE, BLOOD   No results found.   No diagnosis found.  O2- 97%ra, normal   MDM  This patient presents with increasing abdominal pain, nausea, vomiting.  On exam the patient is very uncomfortable appearing, though hemodynamically stable.  With the risk factor for small bowel disruption, the patient had a CT scan.  This demonstrated questionable partial small bowel obstruction, as well as other abnormalities.  With the persistency of her pain, she was admitted for further evaluation and management.        Gerhard Munch, MD 09/14/12 [phone]

## 2012-09-14 NOTE — ED Notes (Signed)
Pt states onset of bouts of diverticulosis this a.m, had multiple BMs but not diarrhea. Husband states she vomited non-stop for a while. Pt also c/o cramping in her abdomen and legs.

## 2012-09-14 NOTE — H&P (Signed)
[facility] History and Physical  TUYEN UNCAPHER VHQ:[id number] DOB: June 04, 1941 DOA: 09/14/2012  Referring physician: Gerhard Munch, MD PCP: [name], MD  Specialists: [facility]  Chief Complaint: Abdominal pain  HPI: Alice Anthony is a 72 y.o. female with past medical history of hypertension and diverticulosis. Patient came to the hospital with abdominal pain. Patient said she was in her usual state of health till this morning, when she started to have some cramping in her lower abdomen. She starts to have nausea and she vomited more than 5 times. Patient has regular bowel movements in the morning, after that started to be mushy, she also had low-grade fever documented in the ED with temperature of 99.8. Patient has known diverticulosis, she said she had a bout of diverticulitis before which was treated at home. Initial evaluation in the emergency department with CT scan showed collection of fluids in the pelvis, thickening of the colon and questionable partial small bowel obstruction. Patient admitted to the hospital for further evaluation.  Review of Systems:  Constitutional: negative for anorexia, fevers and sweats Eyes: negative for irritation, redness and visual disturbance Ears, nose, mouth, throat, and face: negative for earaches, epistaxis, nasal congestion and sore throat Respiratory: negative for cough, dyspnea on exertion, sputum and wheezing Cardiovascular: negative for chest pain, dyspnea, lower extremity edema, orthopnea, palpitations and syncope Gastrointestinal: Has nausea, vomiting, abdominal pain and loose stools per history of present illness Genitourinary:negative for dysuria, frequency and hematuria Hematologic/lymphatic: negative for bleeding, easy bruising and lymphadenopathy Musculoskeletal:negative for arthralgias, muscle weakness and stiff joints Neurological: negative for coordination problems, gait problems, headaches and weakness Endocrine: negative  for diabetic symptoms including polydipsia, polyuria and weight loss Allergic/Immunologic: negative for anaphylaxis, hay fever and urticaria   Past Medical History  Diagnosis Date  . Diverticulosis   . Hypertension   . Hyperlipemia    Past Surgical History  Procedure Laterality Date  . Tubal ligation    . Tonsillectomy     Social History:  reports that she has never smoked. She has never used smokeless tobacco. She reports that she does not drink alcohol or use illicit drugs. Lives at home with her husband  Allergies  Allergen Reactions  . Codeine   . Fosamax (Alendronate Sodium)   . Latex   . Vioxx (Rofecoxib)     Family History  Problem Relation Age of Onset  . Diabetes Father   . Basal cell carcinoma Mother     Metastatic    Prior to Admission medications   Medication Sig Start Date End Date Taking? Authorizing Provider  acidophilus (RISAQUAD) CAPS Take 1 capsule by mouth every evening.   Yes Historical Provider, MD  ALPRAZolam (XANAX) 0.25 MG tablet Take 0.125-0.25 mg by mouth daily as needed for sleep or anxiety.   Yes Historical Provider, MD  amLODipine (NORVASC) 5 MG tablet Take 5 mg by mouth every morning.   Yes Historical Provider, MD  Ascorbic Acid (VITAMIN C) 1000 MG tablet Take 1,000 mg by mouth every morning.   Yes Historical Provider, MD  aspirin EC 81 MG tablet Take 81 mg by mouth every morning.   Yes Historical Provider, MD  calcium-vitamin D (OSCAL WITH D) 500-200 MG-UNIT per tablet Take 1 tablet by mouth 2 (two) times daily.   Yes Historical Provider, MD  cholecalciferol (VITAMIN D) 1000 UNITS tablet Take 2,000 Units by mouth every morning.   Yes Historical Provider, MD  hydrOXYzine (ATARAX/VISTARIL) 25 MG tablet Take 25 mg by mouth at bedtime.  Yes Historical Provider, MD  Multiple Vitamin (MULTIVITAMIN WITH MINERALS) TABS Take 1 tablet by mouth every morning.   Yes Historical Provider, MD  pyridoxine (B-6) 200 MG tablet Take 200 mg by mouth every  morning.   Yes Historical Provider, MD  simvastatin (ZOCOR) 20 MG tablet Take 20 mg by mouth every evening.   Yes Historical Provider, MD   Physical Exam: Filed Vitals:   09/14/12 1830 09/14/12 1900 09/14/12 2000 09/14/12 2030  BP: 184/67 153/60 126/49 113/48  Pulse: 98 99 105 106  Temp:      TempSrc:      Resp:      SpO2: 99% 97% 93% 96%   General appearance: alert, cooperative and no distress  Head: Normocephalic, without obvious abnormality, atraumatic  Eyes: conjunctivae/corneas clear. PERRL, EOM's intact. Fundi benign.  Nose: Nares normal. Septum midline. Mucosa normal. No drainage or sinus tenderness.  Throat: lips, mucosa, and tongue normal; teeth and gums normal  Neck: Supple, no masses, no cervical lymphadenopathy, no JVD appreciated, no meningeal signs Resp: clear to auscultation bilaterally  Chest wall: no tenderness  Cardio: regular rate and rhythm, S1, S2 normal, no murmur, click, rub or gallop  GI: Bowel sounds heard, soft there is moderate to severe tenderness in the left lower quadrant. Extremities: extremities normal, atraumatic, no cyanosis or edema  Skin: Skin color, texture, turgor normal. No rashes or lesions  Neurologic: Alert and oriented X 3, normal strength and tone. Normal symmetric reflexes. Normal coordination and gait   Labs on Admission:  Basic Metabolic Panel:  Recent Labs Lab 09/14/12 1535  NA 141  K 3.3*  CL 102  CO2 22  GLUCOSE 161*  BUN 15  CREATININE 0.77  CALCIUM 9.2   Liver Function Tests:  Recent Labs Lab 09/14/12 1535  AST 27  ALT 27  ALKPHOS 59  BILITOT 0.5  PROT 7.0  ALBUMIN 4.3    Recent Labs Lab 09/14/12 1535  LIPASE 20   No results found for this basename: AMMONIA,  in the last 168 hours CBC:  Recent Labs Lab 09/14/12 1535  WBC 15.1*  NEUTROABS 13.2*  HGB 14.1  HCT 41.0  MCV 92.1  PLT 204   Cardiac Enzymes: No results found for this basename: CKTOTAL, CKMB, CKMBINDEX, TROPONINI,  in the last 168  hours  BNP (last 3 results) No results found for this basename: PROBNP,  in the last 8760 hours CBG: No results found for this basename: GLUCAP,  in the last 168 hours  Radiological Exams on Admission: Ct Abdomen Pelvis W Contrast  09/14/2012  *RADIOLOGY REPORT*  Clinical Data: Abdominal pain and cramping, vomiting  CT ABDOMEN AND PELVIS WITH CONTRAST  Technique:  Multidetector CT imaging of the abdomen and pelvis was performed following the standard protocol during bolus administration of intravenous contrast.  Contrast: OMNIPAQUE IOHEXOL 300 MG/ML  SOLN, 50mL OMNIPAQUE IOHEXOL 300 MG/ML  SOLN  Comparison: None.  Findings: The lung bases are clear.  The liver appears somewhat low in attenuation and fatty infiltration is a consideration.  No calcified gallstones are seen.  The pancreas is normal in size and the pancreatic duct is not dilated.  The adrenal glands and spleen are unremarkable, and there is a small splenule present.  The kidneys enhance with no calculus or mass and on delayed images the pelvocaliceal systems are unremarkable.  The abdominal aorta is normal in caliber.  No adenopathy is seen. Slightly prominent loops of small bowel are noted proximally, with decompression of  the distal ileal small bowel loops.  A partial small bowel obstruction is difficult to exclude and follow up plain films recommended if symptoms persist.  No definite point of obstruction is seen.  Within the urinary bladder to the right of midline posteriorly there is a 10 mm area of increased attenuation measuring 68 HU. However, delayed images of the bladder were performed and this area does not persist.  This may represent a small amount of contrast collecting in the posterior bladder, with this area being very near the right UV junction.  However if there is any suspicion of bladder lesion then cystoscopy would be recommended.  The uterus is anteverted and normal in size.  No adnexal lesion is seen.  There is a  moderate amount of free fluid in the pelvis. There is some thickening of the mucosa of the rectosigmoid colon which may be due to diverticulosis.  Underlying neoplasm of the sigmoid colon cannot be excluded.  Has the patient undergone routine colonoscopy?  There are multiple diverticula scattered throughout the colon but primarily within the within the descending and rectosigmoid colon. The terminal ileum and the appendix are unremarkable.  There is a probable lipoma within the right iliac fossa surrounding the iliopsoas musculature and pushing the iliac vasculature medially.  IMPRESSION:  1.  Slightly prominent small bowel loops proximally with decompression of the distal small bowel.  Cannot exclude a partial small bowel obstruction although no point of obstruction could be determined.  Consider follow-up plain films if symptoms persist or worsen.  2.  Small focus of higher attenuation in the posterior right urinary bladder may represent contrast collecting posteriorly although a bladder lesion cannot be excluded.  Correlate clinically. 3. Thickening of the mucosa of the rectosigmoid colon may be due to diverticulosis, but an underlying neoplasm cannot be excluded. 4.  Descending and rectosigmoid colon diverticula. 5.  Lipoma within the right iliac fossa as described above. 6.  Question fatty infiltration of the liver. 7.  Small amount of free fluid within the pelvis.   Original Report Authenticated By: Dwyane Dee, M.D.     EKG: Independently reviewed.  Assessment/Plan Active Problems:   Nausea and vomiting   Abdominal pain   Colon wall thickening   Abnormal CT scan, pelvis   Hypertension   Abdominal pain -CT scan suggested partial small bowel obstruction. -Clinically patient presentation is consistent with mild acute diverticulitis. -Presentation includes low-grade fever, nausea/vomiting and LLQ pain and tenderness. -CT scan has diverticular thickening and some pelvic fluids can be explained by  mild diverticulitis (but not typical changes for diverticulitis). -Treat the patient empirically for acute diverticulitis with Cipro and Flagyl. -Supportive management with antiemetics, IV fluids, clear liquids. Advance diet when tolerated.  Abnormal CT scan of pelvis -Showed small focus of the posterior bladder may represent contrast collecting posteriorly versus bladder lesion. -This can be followed as outpatient.  Colonic wall thickening -Per CT scan report this could be secondary to the diverticulosis but neoplasm also a possibility. -Patient has had colonoscopy in August of 2012, reported abnormal findings only was diverticulosis.  Hypertension -Continue preadmission oral medications.  Code Status: Full code  Family Communication:Spoke with the patient with her husband at bedside.  Disposition Plan: Inpatient, likely to stay for more than 2 midnights  Time spent: 70 minutes  [facility] Pager [phone]  If 7PM-7AM, please contact night-coverage [email or web address] Password [facility] 09/14/2012, 8:37 PM

## 2012-09-14 NOTE — ED Notes (Signed)
ZOX:[id number]<AV> Expected date:09/14/12<BR> Expected time: 2:04 PM<BR> Means of arrival:Ambulance<BR> Comments:<BR> abd pain

## 2012-09-14 NOTE — ED Notes (Signed)
Pt. Output was @ 500cc. Pt. Urine was cloudy.

## 2012-09-15 ENCOUNTER — Encounter (HOSPITAL_COMMUNITY): Payer: Self-pay | Admitting: *Deleted

## 2012-09-15 ENCOUNTER — Inpatient Hospital Stay (HOSPITAL_COMMUNITY): Payer: Medicare Other

## 2012-09-15 DIAGNOSIS — I10 Essential (primary) hypertension: Secondary | ICD-10-CM

## 2012-09-15 LAB — GLUCOSE, CAPILLARY
Glucose-Capillary: 165 mg/dL — ABNORMAL HIGH (ref 70–99)
Glucose-Capillary: 167 mg/dL — ABNORMAL HIGH (ref 70–99)
Glucose-Capillary: 243 mg/dL — ABNORMAL HIGH (ref 70–99)

## 2012-09-15 LAB — CBC
HCT: 37 % (ref 36.0–46.0)
MCHC: 33.8 g/dL (ref 30.0–36.0)
MCV: 93.4 fL (ref 78.0–100.0)
RDW: 13.8 % (ref 11.5–15.5)

## 2012-09-15 LAB — BASIC METABOLIC PANEL
BUN: 13 mg/dL (ref 6–23)
CO2: 21 mEq/L (ref 19–32)
Chloride: 105 mEq/L (ref 96–112)
Creatinine, Ser: 0.81 mg/dL (ref 0.50–1.10)
GFR calc Af Amer: 83 mL/min — ABNORMAL LOW (ref >90)

## 2012-09-15 MED ORDER — INSULIN ASPART 100 UNIT/ML ~~LOC~~ SOLN
0.0000 [IU] | SUBCUTANEOUS | Status: DC
  Administered 2012-09-15: 3 [IU] via SUBCUTANEOUS
  Administered 2012-09-15 – 2012-09-16 (×7): 2 [IU] via SUBCUTANEOUS
  Administered 2012-09-16: 1 [IU] via SUBCUTANEOUS
  Administered 2012-09-16: 2 [IU] via SUBCUTANEOUS
  Administered 2012-09-16: 1 [IU] via SUBCUTANEOUS
  Administered 2012-09-17: 3 [IU] via SUBCUTANEOUS
  Administered 2012-09-17: 2 [IU] via SUBCUTANEOUS
  Administered 2012-09-17 (×2): 1 [IU] via SUBCUTANEOUS
  Administered 2012-09-17: 2 [IU] via SUBCUTANEOUS
  Administered 2012-09-18: 1 [IU] via SUBCUTANEOUS
  Administered 2012-09-18 (×2): 2 [IU] via SUBCUTANEOUS
  Administered 2012-09-18 (×2): 1 [IU] via SUBCUTANEOUS
  Administered 2012-09-18: 2 [IU] via SUBCUTANEOUS
  Administered 2012-09-18: 1 [IU] via SUBCUTANEOUS
  Administered 2012-09-19 (×2): 2 [IU] via SUBCUTANEOUS
  Administered 2012-09-19: 1 [IU] via SUBCUTANEOUS
  Administered 2012-09-20: 3 [IU] via SUBCUTANEOUS
  Administered 2012-09-20: 2 [IU] via SUBCUTANEOUS
  Administered 2012-09-20 (×2): 1 [IU] via SUBCUTANEOUS
  Administered 2012-09-20 (×2): 2 [IU] via SUBCUTANEOUS
  Administered 2012-09-21: 3 [IU] via SUBCUTANEOUS
  Administered 2012-09-21: 2 [IU] via SUBCUTANEOUS
  Administered 2012-09-21 (×2): 1 [IU] via SUBCUTANEOUS
  Administered 2012-09-21 (×2): 2 [IU] via SUBCUTANEOUS
  Administered 2012-09-22: 1 [IU] via SUBCUTANEOUS
  Administered 2012-09-22: via SUBCUTANEOUS
  Administered 2012-09-22 – 2012-09-23 (×5): 1 [IU] via SUBCUTANEOUS
  Administered 2012-09-24: 2 [IU] via SUBCUTANEOUS
  Administered 2012-09-24: 1 [IU] via SUBCUTANEOUS
  Administered 2012-09-24: 2 [IU] via SUBCUTANEOUS

## 2012-09-15 MED ORDER — KCL IN DEXTROSE-NACL 20-5-0.45 MEQ/L-%-% IV SOLN
INTRAVENOUS | Status: DC
  Administered 2012-09-15: 75 mL/h via INTRAVENOUS
  Administered 2012-09-16: 09:00:00 via INTRAVENOUS
  Administered 2012-09-17 – 2012-09-18 (×2): 75 mL/h via INTRAVENOUS
  Administered 2012-09-18 – 2012-09-19 (×2): via INTRAVENOUS
  Filled 2012-09-15 (×9): qty 1000

## 2012-09-15 NOTE — Progress Notes (Signed)
Utilization review done.

## 2012-09-15 NOTE — Progress Notes (Signed)
[facility] PROGRESS NOTE  NOHEALANI MEDINGER YQM:[id number] DOB: 26-Feb-1941 DOA: 09/14/2012 PCP: [name], MD  Assessment/Plan: Nausea and vomiting  Abdominal pain  Colon wall thickening  Abnormal CT scan, pelvis  Hypertension   Abdominal pain  -CT scan suggested partial small bowel obstruction. - F/u abd xray  Reported as resolution of small bowel dilatation. No acute findings. -Clinically patient presentation is consistent with mild acute diverticulitis.  -Presentation includes low-grade fever, nausea/vomiting and LLQ pain and tenderness. Which is improved on cipro and flagyl -CT scan has diverticular thickening and some pelvic fluids can be explained by mild diverticulitis (but not typical changes for diverticulitis).  -Treat the patient empirically for acute diverticulitis with Cipro and Flagyl.  -Supportive management with antiemetics, MIV fluids, clear liquids. Advance diet when tolerated.   Abnormal CT scan of pelvis  -Showed small focus of the posterior bladder may represent contrast collecting posteriorly versus bladder lesion.  -This can be followed as outpatient.   Colonic wall thickening  -Per CT scan report this could be secondary to the diverticulosis but neoplasm also a possibility.  -Patient has had colonoscopy in August of 2012, reported abnormal findings only was diverticulosis.  - Most likely due to infectious etiology resulting in inflammatory changes.  Will treat with IV antibiotics and reassess as indicated above.  Hypertension  -Continue preadmission oral medications.  - Relatively well controlled.  Code Status: full Family Communication: No family at bedside discussed with patient Disposition Plan: Pending further improvement in clinical condition.   Consultants:  none  Procedures:  none  Antibiotics:  Ciprofloxacin  Metronidazole  HPI/Subjective: Patient has no new complaints.  Still having LLQ abdominal discomfort but better than  initial presentation.  No acute issues overnight.  Objective: Filed Vitals:   09/14/12 2030 09/14/12 2334 09/15/12 0500 09/15/12 0600  BP: 113/48 121/67  112/69  Pulse: 106 104  103  Temp:  99.2 F (37.3 C)  98 F (36.7 C)  TempSrc:  Oral  Oral  Resp:  20  20  Height:   5\' 2"  (1.575 m)   Weight:   63.504 kg (140 lb)   SpO2: 96% 95%  90%    Intake/Output Summary (Last 24 hours) at 09/15/12 1446 Last data filed at 09/15/12 0600  Gross per 24 hour  Intake    660 ml  Output   1000 ml  Net   -340 ml   Filed Weights   09/15/12 0500  Weight: 63.504 kg (140 lb)    Exam:   General:  Pt in NAD, Alert and Oriented  Cardiovascular: RRR, No MRG  Respiratory: CTA BL, no wheezes  Abdomen: soft, NT, ND  Data Reviewed: Basic Metabolic Panel:  Recent Labs Lab 09/14/12 1535 09/15/12 0510  NA 141 138  K 3.3* 3.4*  CL 102 105  CO2 22 21  GLUCOSE 161* 177*  BUN 15 13  CREATININE 0.77 0.81  CALCIUM 9.2 8.0*   Liver Function Tests:  Recent Labs Lab 09/14/12 1535  AST 27  ALT 27  ALKPHOS 59  BILITOT 0.5  PROT 7.0  ALBUMIN 4.3    Recent Labs Lab 09/14/12 1535  LIPASE 20   No results found for this basename: AMMONIA,  in the last 168 hours CBC:  Recent Labs Lab 09/14/12 1535 09/15/12 0510  WBC 15.1* 16.7*  NEUTROABS 13.2*  --   HGB 14.1 12.5  HCT 41.0 37.0  MCV 92.1 93.4  PLT 204 176   Cardiac Enzymes: No results found  for this basename: CKTOTAL, CKMB, CKMBINDEX, TROPONINI,  in the last 168 hours BNP (last 3 results) No results found for this basename: PROBNP,  in the last 8760 hours CBG:  Recent Labs Lab 09/15/12 0026 09/15/12 0420 09/15/12 0812 09/15/12 1238  GLUCAP 200* 164* 165* 167*    No results found for this or any previous visit (from the past 240 hour(s)).   Studies: Ct Abdomen Pelvis W Contrast  09/14/2012  *RADIOLOGY REPORT*  Clinical Data: Abdominal pain and cramping, vomiting  CT ABDOMEN AND PELVIS WITH CONTRAST  Technique:   Multidetector CT imaging of the abdomen and pelvis was performed following the standard protocol during bolus administration of intravenous contrast.  Contrast: OMNIPAQUE IOHEXOL 300 MG/ML  SOLN, 50mL OMNIPAQUE IOHEXOL 300 MG/ML  SOLN  Comparison: None.  Findings: The lung bases are clear.  The liver appears somewhat low in attenuation and fatty infiltration is a consideration.  No calcified gallstones are seen.  The pancreas is normal in size and the pancreatic duct is not dilated.  The adrenal glands and spleen are unremarkable, and there is a small splenule present.  The kidneys enhance with no calculus or mass and on delayed images the pelvocaliceal systems are unremarkable.  The abdominal aorta is normal in caliber.  No adenopathy is seen. Slightly prominent loops of small bowel are noted proximally, with decompression of the distal ileal small bowel loops.  A partial small bowel obstruction is difficult to exclude and follow up plain films recommended if symptoms persist.  No definite point of obstruction is seen.  Within the urinary bladder to the right of midline posteriorly there is a 10 mm area of increased attenuation measuring 68 HU. However, delayed images of the bladder were performed and this area does not persist.  This may represent a small amount of contrast collecting in the posterior bladder, with this area being very near the right UV junction.  However if there is any suspicion of bladder lesion then cystoscopy would be recommended.  The uterus is anteverted and normal in size.  No adnexal lesion is seen.  There is a moderate amount of free fluid in the pelvis. There is some thickening of the mucosa of the rectosigmoid colon which may be due to diverticulosis.  Underlying neoplasm of the sigmoid colon cannot be excluded.  Has the patient undergone routine colonoscopy?  There are multiple diverticula scattered throughout the colon but primarily within the within the descending and  rectosigmoid colon. The terminal ileum and the appendix are unremarkable.  There is a probable lipoma within the right iliac fossa surrounding the iliopsoas musculature and pushing the iliac vasculature medially.  IMPRESSION:  1.  Slightly prominent small bowel loops proximally with decompression of the distal small bowel.  Cannot exclude a partial small bowel obstruction although no point of obstruction could be determined.  Consider follow-up plain films if symptoms persist or worsen.  2.  Small focus of higher attenuation in the posterior right urinary bladder may represent contrast collecting posteriorly although a bladder lesion cannot be excluded.  Correlate clinically. 3. Thickening of the mucosa of the rectosigmoid colon may be due to diverticulosis, but an underlying neoplasm cannot be excluded. 4.  Descending and rectosigmoid colon diverticula. 5.  Lipoma within the right iliac fossa as described above. 6.  Question fatty infiltration of the liver. 7.  Small amount of free fluid within the pelvis.   Original Report Authenticated By: Dwyane Dee, M.D.    Dg Abd 2  Views  09/15/2012  *RADIOLOGY REPORT*  Clinical Data: Possible small bowel obstruction.  Abdominal pain. Distention nausea and vomiting.  ABDOMEN - 2 VIEW  Comparison: CT 1 day prior  Findings: Supine and 2 right-sided decubitus views.  The supine view demonstrates no small bowel dilatation.  Contrast and gas within the colon, which is normal in caliber.  No pneumatosis or free intraperitoneal air.  Right-sided decubitus views demonstrate no free intraperitoneal air or significant air fluid levels.  IMPRESSION: Resolution of small bowel dilatation. No acute findings.   Original Report Authenticated By: Jeronimo Greaves, M.D.     Scheduled Meds: . acidophilus  1 capsule Oral QPM  . amLODipine  5 mg Oral q morning - 10a  . aspirin EC  81 mg Oral q morning - 10a  . ciprofloxacin  400 mg Intravenous BID  . heparin  5,000 Units Subcutaneous Q8H  .  hydrOXYzine  25 mg Oral QHS  . insulin aspart  0-9 Units Subcutaneous Q4H  . metronidazole  500 mg Intravenous Q8H  . multivitamin with minerals  1 tablet Oral q morning - 10a  . potassium chloride  60 mEq Oral Daily  . simvastatin  20 mg Oral QPM   Continuous Infusions: . 0.9 % NaCl with KCl 20 mEq / L 75 mL/hr (09/15/12 1041)    Active Problems:   Nausea and vomiting   Abdominal pain   Colon wall thickening   Abnormal CT scan, pelvis   Hypertension    Time spent: > 35 minutes    Penny Pia  [facility] Pager [phone] If 8PM-8AM, please contact night-coverage at [email or web address], password [facility] 09/15/2012, 2:46 PM  LOS: 1 day

## 2012-09-15 NOTE — Progress Notes (Signed)
Patient's pulse ox only 86-91% on room air.  Patient placed on 1 liter of oxygen now pulse ox is 96%.  Dr. Cena Benton aware. Will monitor.

## 2012-09-16 LAB — GLUCOSE, CAPILLARY
Glucose-Capillary: 144 mg/dL — ABNORMAL HIGH (ref 70–99)
Glucose-Capillary: 159 mg/dL — ABNORMAL HIGH (ref 70–99)

## 2012-09-16 LAB — CBC
HCT: 35.8 % — ABNORMAL LOW (ref 36.0–46.0)
Hemoglobin: 12.1 g/dL (ref 12.0–15.0)
MCHC: 33.8 g/dL (ref 30.0–36.0)

## 2012-09-16 MED ORDER — ONDANSETRON HCL 4 MG PO TABS
4.0000 mg | ORAL_TABLET | Freq: Four times a day (QID) | ORAL | Status: DC
  Administered 2012-09-17: 4 mg via ORAL
  Filled 2012-09-16 (×25): qty 1

## 2012-09-16 MED ORDER — PROMETHAZINE HCL 25 MG/ML IJ SOLN
12.5000 mg | Freq: Once | INTRAMUSCULAR | Status: AC
  Administered 2012-09-16: 12.5 mg via INTRAVENOUS
  Filled 2012-09-16: qty 1

## 2012-09-16 MED ORDER — SENNA 8.6 MG PO TABS
1.0000 | ORAL_TABLET | Freq: Every day | ORAL | Status: DC | PRN
  Administered 2012-09-16 – 2012-09-17 (×2): 8.6 mg via ORAL
  Filled 2012-09-16 (×3): qty 1

## 2012-09-16 MED ORDER — SODIUM CHLORIDE 0.9 % IV SOLN
500.0000 mg | Freq: Three times a day (TID) | INTRAVENOUS | Status: DC
  Administered 2012-09-16 – 2012-09-18 (×6): 500 mg via INTRAVENOUS
  Filled 2012-09-16 (×7): qty 500

## 2012-09-16 MED ORDER — ONDANSETRON HCL 4 MG/2ML IJ SOLN
4.0000 mg | Freq: Four times a day (QID) | INTRAMUSCULAR | Status: DC
  Administered 2012-09-16 – 2012-09-22 (×18): 4 mg via INTRAVENOUS
  Filled 2012-09-16 (×11): qty 2

## 2012-09-16 MED ORDER — METOCLOPRAMIDE HCL 5 MG/ML IJ SOLN
10.0000 mg | Freq: Once | INTRAMUSCULAR | Status: AC
  Administered 2012-09-16: 10 mg via INTRAVENOUS

## 2012-09-16 MED ORDER — METOCLOPRAMIDE HCL 5 MG/ML IJ SOLN
INTRAMUSCULAR | Status: AC
  Filled 2012-09-16: qty 2

## 2012-09-16 NOTE — Progress Notes (Signed)
ANTIBIOTIC CONSULT NOTE - INITIAL  Pharmacy Consult for Primaxin Indication: colitis  Allergies  Allergen Reactions  . Codeine   . Fosamax (Alendronate Sodium)   . Latex   . Vioxx (Rofecoxib)     Patient Measurements: Height: 5\' 2"  (157.5 cm) Weight: 140 lb (63.504 kg) IBW/kg (Calculated) : 50.1   Vital Signs: Temp: 99.5 F (37.5 C) (02/11 0631) Temp src: Oral (02/11 0631) BP: 130/59 mmHg (02/11 0631) Pulse Rate: 108 (02/11 0631) Intake/Output from previous day: 02/10 0701 - 02/11 0700 In: 2364 [P.O.:240; I.V.:1024; IV Piggyback:1100] Out: 725 [Urine:725] Intake/Output from this shift: Total I/O In: -  Out: 1 [Emesis/NG output:1]  Labs:  Recent Labs  09/14/12 1535 09/15/12 0510  WBC 15.1* 16.7*  HGB 14.1 12.5  PLT 204 176  CREATININE 0.77 0.81   Estimated Creatinine Clearance: 56.6 ml/min (by C-G formula based on Cr of 0.81). No results found for this basename: VANCOTROUGH, VANCOPEAK, VANCORANDOM, GENTTROUGH, GENTPEAK, GENTRANDOM, TOBRATROUGH, TOBRAPEAK, TOBRARND, AMIKACINPEAK, AMIKACINTROU, AMIKACIN,  in the last 72 hours   Microbiology: No results found for this or any previous visit (from the past 720 hour(s)).  Medical History: Past Medical History  Diagnosis Date  . Diverticulosis   . Hypertension   . Hyperlipemia     Assessment:  56 yof with h/o diverticulosis presented 2/9 with abd pain, N/V.  Cipro and Flagyl started empirically for diverticulitis.  CT suggested partial SBO with resolution of small bowel dilatation on f/u abd X-ray.  Today, 2/11, MD discontinued Cipro and Flagyl today and ordered for Primaxin for colitis.   Afeb, WBC 16.7K, Scr 0.81 (CG 57 ml/min, N 73 ml/min)   Plan:   Primaxin 500 mg IV q8h  Pharmacy will f/u  Geoffry Paradise, PharmD, BCPS Pager: [phone] 2:45 PM Pharmacy #: [id number]

## 2012-09-16 NOTE — Progress Notes (Signed)
Patient has had a difficult night with indigestion that was finally relieved with maalox however she not able to tolerated hardly anything by mouth tonight. She had several episodes of dry heaves. Never any emesis. She complains of cramping like pain in her lower abdomen that comes and goes. Patient was advised to not attempt anything else by mouth until pain and indigestion resolves or until she is see by the physician today. Symptoms become worse when she attempts to take in fluids.

## 2012-09-16 NOTE — Progress Notes (Signed)
Pt vomiting and moaning with nausea and pain.  Dr Cena Benton sent a text page, and informed.

## 2012-09-16 NOTE — Progress Notes (Signed)
New orders noted.

## 2012-09-16 NOTE — Progress Notes (Signed)
[facility] PROGRESS NOTE  Alice Anthony EXB:[id number] DOB: 1941/03/21 DOA: 09/14/2012 PCP: [name], MD  Assessment/Plan: Nausea and vomiting  Abdominal pain  Colon wall thickening  Abnormal CT scan, pelvis  Hypertension   Abdominal pain  - CT scan suggested partial small bowel obstruction. - F/u abd xray  Reported as resolution of small bowel dilatation. No acute findings. - Clinically patient presentation is consistent with mild acute diverticulitis.  - Presentation includes low-grade fever, nausea/vomiting and LLQ pain and tenderness. Patient felt like she was having worsening nausea with flagyl.  As a result her antibiotic regimen has been changed to Primaxin - CT scan has diverticular thickening and some pelvic fluids can be explained by mild diverticulitis (but not typical changes for diverticulitis).  - Treat the patient empirically for acute diverticulitis with primaxin please see above  - Supportive management with antiemetics, MIV fluids, clear liquids. Advance diet when tolerated.   Abnormal CT scan of pelvis  -Showed small focus of the posterior bladder may represent contrast collecting posteriorly versus bladder lesion.  -This can be followed as outpatient.   Colonic wall thickening  -Per CT scan report this could be secondary to the diverticulosis but neoplasm also a possibility.  -Patient has had colonoscopy in August of 2012, reported abnormal findings only was diverticulosis.  - Most likely due to infectious etiology resulting in inflammatory changes.  Will treat with IV antibiotics and reassess as indicated above.  Hypertension  -Continue preadmission oral medications.  - Relatively well controlled.  Code Status: full Family Communication: No family at bedside discussed with patient Disposition Plan: Pending further improvement in clinical  condition.   Consultants:  none  Procedures:  none  Antibiotics:  Ciprofloxacin  Metronidazole  HPI/Subjective: Patient has no new complaints.  Nursing paged me and reported that after patient had her flagyl she developed worsening nausea and as such patient requesting to try different antibiotic regimen.    Objective: Filed Vitals:   09/15/12 1603 09/15/12 2200 09/16/12 0631 09/16/12 1519  BP:  118/69 130/59 106/62  Pulse:  89 108 102  Temp:  99.1 F (37.3 C) 99.5 F (37.5 C) 98.6 F (37 C)  TempSrc:  Oral Oral Oral  Resp:  20 20 18   Height:      Weight:      SpO2: 96% 97% 93% 93%    Intake/Output Summary (Last 24 hours) at 09/16/12 1601 Last data filed at 09/16/12 1500  Gross per 24 hour  Intake 3335.21 ml  Output   1076 ml  Net 2259.21 ml   Filed Weights   09/15/12 0500  Weight: 63.504 kg (140 lb)    Exam:   General:  Pt in NAD, Alert and Oriented  Cardiovascular: RRR, No MRG  Respiratory: CTA BL, no wheezes  Abdomen: soft, NT, ND  Data Reviewed: Basic Metabolic Panel:  Recent Labs Lab 09/14/12 1535 09/15/12 0510  NA 141 138  K 3.3* 3.4*  CL 102 105  CO2 22 21  GLUCOSE 161* 177*  BUN 15 13  CREATININE 0.77 0.81  CALCIUM 9.2 8.0*   Liver Function Tests:  Recent Labs Lab 09/14/12 1535  AST 27  ALT 27  ALKPHOS 59  BILITOT 0.5  PROT 7.0  ALBUMIN 4.3    Recent Labs Lab 09/14/12 1535  LIPASE 20   No results found for this basename: AMMONIA,  in the last 168 hours CBC:  Recent Labs Lab 09/14/12 1535 09/15/12 0510 09/16/12 1441  WBC 15.1* 16.7* 16.4*  NEUTROABS 13.2*  --   --   HGB 14.1 12.5 12.1  HCT 41.0 37.0 35.8*  MCV 92.1 93.4 93.5  PLT 204 176 159   Cardiac Enzymes: No results found for this basename: CKTOTAL, CKMB, CKMBINDEX, TROPONINI,  in the last 168 hours BNP (last 3 results) No results found for this basename: PROBNP,  in the last 8760 hours CBG:  Recent Labs Lab 09/15/12 1951 09/16/12 0010  09/16/12 0347 09/16/12 0730 09/16/12 1136  GLUCAP 173* 159* 144* 141* 156*    No results found for this or any previous visit (from the past 240 hour(s)).   Studies: Ct Abdomen Pelvis W Contrast  09/14/2012  *RADIOLOGY REPORT*  Clinical Data: Abdominal pain and cramping, vomiting  CT ABDOMEN AND PELVIS WITH CONTRAST  Technique:  Multidetector CT imaging of the abdomen and pelvis was performed following the standard protocol during bolus administration of intravenous contrast.  Contrast: OMNIPAQUE IOHEXOL 300 MG/ML  SOLN, 50mL OMNIPAQUE IOHEXOL 300 MG/ML  SOLN  Comparison: None.  Findings: The lung bases are clear.  The liver appears somewhat low in attenuation and fatty infiltration is a consideration.  No calcified gallstones are seen.  The pancreas is normal in size and the pancreatic duct is not dilated.  The adrenal glands and spleen are unremarkable, and there is a small splenule present.  The kidneys enhance with no calculus or mass and on delayed images the pelvocaliceal systems are unremarkable.  The abdominal aorta is normal in caliber.  No adenopathy is seen. Slightly prominent loops of small bowel are noted proximally, with decompression of the distal ileal small bowel loops.  A partial small bowel obstruction is difficult to exclude and follow up plain films recommended if symptoms persist.  No definite point of obstruction is seen.  Within the urinary bladder to the right of midline posteriorly there is a 10 mm area of increased attenuation measuring 68 HU. However, delayed images of the bladder were performed and this area does not persist.  This may represent a small amount of contrast collecting in the posterior bladder, with this area being very near the right UV junction.  However if there is any suspicion of bladder lesion then cystoscopy would be recommended.  The uterus is anteverted and normal in size.  No adnexal lesion is seen.  There is a moderate amount of free fluid in the  pelvis. There is some thickening of the mucosa of the rectosigmoid colon which may be due to diverticulosis.  Underlying neoplasm of the sigmoid colon cannot be excluded.  Has the patient undergone routine colonoscopy?  There are multiple diverticula scattered throughout the colon but primarily within the within the descending and rectosigmoid colon. The terminal ileum and the appendix are unremarkable.  There is a probable lipoma within the right iliac fossa surrounding the iliopsoas musculature and pushing the iliac vasculature medially.  IMPRESSION:  1.  Slightly prominent small bowel loops proximally with decompression of the distal small bowel.  Cannot exclude a partial small bowel obstruction although no point of obstruction could be determined.  Consider follow-up plain films if symptoms persist or worsen.  2.  Small focus of higher attenuation in the posterior right urinary bladder may represent contrast collecting posteriorly although a bladder lesion cannot be excluded.  Correlate clinically. 3. Thickening of the mucosa of the rectosigmoid colon may be due to diverticulosis, but an underlying neoplasm cannot be excluded. 4.  Descending and rectosigmoid colon diverticula. 5.  Lipoma within the right  iliac fossa as described above. 6.  Question fatty infiltration of the liver. 7.  Small amount of free fluid within the pelvis.   Original Report Authenticated By: Dwyane Dee, M.D.    Dg Abd 2 Views  09/15/2012  *RADIOLOGY REPORT*  Clinical Data: Possible small bowel obstruction.  Abdominal pain. Distention nausea and vomiting.  ABDOMEN - 2 VIEW  Comparison: CT 1 day prior  Findings: Supine and 2 right-sided decubitus views.  The supine view demonstrates no small bowel dilatation.  Contrast and gas within the colon, which is normal in caliber.  No pneumatosis or free intraperitoneal air.  Right-sided decubitus views demonstrate no free intraperitoneal air or significant air fluid levels.  IMPRESSION:  Resolution of small bowel dilatation. No acute findings.   Original Report Authenticated By: Jeronimo Greaves, M.D.     Scheduled Meds: . acidophilus  1 capsule Oral QPM  . amLODipine  5 mg Oral q morning - 10a  . aspirin EC  81 mg Oral q morning - 10a  . heparin  5,000 Units Subcutaneous Q8H  . hydrOXYzine  25 mg Oral QHS  . imipenem-cilastatin  500 mg Intravenous Q8H  . insulin aspart  0-9 Units Subcutaneous Q4H  . metoCLOPramide      . multivitamin with minerals  1 tablet Oral q morning - 10a  . ondansetron (ZOFRAN) IV  4 mg Intravenous Q6H   Or  . ondansetron  4 mg Oral Q6H  . simvastatin  20 mg Oral QPM   Continuous Infusions: . dextrose 5 % and 0.45 % NaCl with KCl 20 mEq/L 75 mL/hr at 09/16/12 0907    Active Problems:   Nausea and vomiting   Abdominal pain   Colon wall thickening   Abnormal CT scan, pelvis   Hypertension    Time spent: > 35 minutes    Penny Pia  [facility] Pager [phone] If 8PM-8AM, please contact night-coverage at [email or web address], password [facility] 09/16/2012, 4:01 PM  LOS: 2 days

## 2012-09-17 ENCOUNTER — Inpatient Hospital Stay (HOSPITAL_COMMUNITY): Payer: Medicare Other

## 2012-09-17 LAB — CBC
HCT: 34.3 % — ABNORMAL LOW (ref 36.0–46.0)
Hemoglobin: 11.7 g/dL — ABNORMAL LOW (ref 12.0–15.0)
MCHC: 34.1 g/dL (ref 30.0–36.0)

## 2012-09-17 LAB — LIPASE, BLOOD: Lipase: 11 U/L (ref 11–59)

## 2012-09-17 LAB — HEPATIC FUNCTION PANEL
ALT: 14 U/L (ref 0–35)
Total Protein: 6 g/dL (ref 6.0–8.3)

## 2012-09-17 LAB — GLUCOSE, CAPILLARY
Glucose-Capillary: 118 mg/dL — ABNORMAL HIGH (ref 70–99)
Glucose-Capillary: 131 mg/dL — ABNORMAL HIGH (ref 70–99)
Glucose-Capillary: 144 mg/dL — ABNORMAL HIGH (ref 70–99)
Glucose-Capillary: 172 mg/dL — ABNORMAL HIGH (ref 70–99)

## 2012-09-17 MED ORDER — FLUTICASONE PROPIONATE 50 MCG/ACT NA SUSP
1.0000 | Freq: Every day | NASAL | Status: DC
  Administered 2012-09-17 – 2012-09-26 (×9): 1 via NASAL
  Filled 2012-09-17 (×2): qty 16

## 2012-09-17 MED ORDER — HYDROMORPHONE HCL PF 1 MG/ML IJ SOLN
1.0000 mg | INTRAMUSCULAR | Status: DC | PRN
  Administered 2012-09-17 – 2012-09-19 (×8): 1 mg via INTRAVENOUS
  Filled 2012-09-17 (×10): qty 1

## 2012-09-17 MED ORDER — PANTOPRAZOLE SODIUM 40 MG IV SOLR
40.0000 mg | INTRAVENOUS | Status: DC
  Administered 2012-09-17 – 2012-09-24 (×8): 40 mg via INTRAVENOUS
  Filled 2012-09-17 (×9): qty 40

## 2012-09-17 NOTE — Progress Notes (Signed)
[facility] PROGRESS NOTE  DALEENA ROTTER XBJ:[id number] DOB: 1941-08-05 DOA: 09/14/2012  PCP: [name], MD  Brief HPI: Alice Anthony is a 72 y.o. female with past medical history of hypertension and diverticulosis. Patient came to the hospital with abdominal pain. Patient said she was in her usual state of health till the morning of admission, when she started to have some cramping in her lower abdomen. She had nausea and she vomited more than 5 times. Patient had regular bowel movements in the morning, after that started to be mushy. She also had low-grade fever documented in the ED with temperature of 99.8. Patient has known diverticulosis, she said she had a bout of diverticulitis before which was treated at home. Initial evaluation in the emergency department with CT scan showed collection of fluids in the pelvis, thickening of the colon and questionable partial small bowel obstruction. Patient admitted to the hospital for further evaluation.  Past medical history:  Past Medical History  Diagnosis Date  . Diverticulosis   . Hypertension   . Hyperlipemia     Consultants: None  Procedures: None  Antibiotics: Primaxin 2/11--> Was on Cipro/Flagyl initially  Subjective: Patient continues to have abdominal cramps with nausea and heartburns. Passing some gas from below. No BM's.  Objective: Vital Signs  Filed Vitals:   09/16/12 0631 09/16/12 1519 09/16/12 2200 09/17/12 0645  BP: 130/59 106/62 113/60 103/65  Pulse: 108 102 99 92  Temp: 99.5 F (37.5 C) 98.6 F (37 C) 99 F (37.2 C) 98.4 F (36.9 C)  TempSrc: Oral Oral Oral Oral  Resp: 20 18 20 20   Height:      Weight:      SpO2: 93% 93% 91% 92%    Intake/Output Summary (Last 24 hours) at 09/17/12 3086 Last data filed at 09/17/12 0500  Gross per 24 hour  Intake 1991.25 ml  Output   1151 ml  Net 840.25 ml   Filed Weights   09/15/12 0500  Weight: 63.504 kg (140 lb)    Intake/Output from previous  day: 02/11 0701 - 02/12 0700 In: 1991.3 [P.O.:60; I.V.:1531.3; IV Piggyback:400] Out: 1151 [Urine:1150; Emesis/NG output:1]  General appearance: alert, cooperative, appears stated age and no distress Head: Normocephalic, without obvious abnormality, atraumatic Resp: clear to auscultation bilaterally Cardio: regular rate and rhythm, S1, S2 normal, no murmur, click, rub or gallop GI: soft, Tender diffusely, more so in lower abdomen. Without rebound. No masses or organomegaly. BS sluggish.  Extremities: extremities normal, atraumatic, no cyanosis or edema Pulses: 2+ and symmetric Skin: Skin color, texture, turgor normal. No rashes or lesions Lymph nodes: Cervical, supraclavicular, and axillary nodes normal. Neurologic: Alert and oriented x 3. No focal deficits.  Lab Results:  Basic Metabolic Panel:  Recent Labs Lab 09/14/12 1535 09/15/12 0510  NA 141 138  K 3.3* 3.4*  CL 102 105  CO2 22 21  GLUCOSE 161* 177*  BUN 15 13  CREATININE 0.77 0.81  CALCIUM 9.2 8.0*   Liver Function Tests:  Recent Labs Lab 09/14/12 1535  AST 27  ALT 27  ALKPHOS 59  BILITOT 0.5  PROT 7.0  ALBUMIN 4.3    Recent Labs Lab 09/14/12 1535  LIPASE 20   CBC:  Recent Labs Lab 09/14/12 1535 09/15/12 0510 09/16/12 1441 09/17/12 0518  WBC 15.1* 16.7* 16.4* 13.1*  NEUTROABS 13.2*  --   --   --   HGB 14.1 12.5 12.1 11.7*  HCT 41.0 37.0 35.8* 34.3*  MCV 92.1 93.4 93.5 93.2  PLT 204 176 159 160   CBG:  Recent Labs Lab 09/16/12 1636 09/16/12 2014 09/16/12 2357 09/17/12 0413 09/17/12 0743  GLUCAP 157* 126* 118* 172* 131*     Studies/Results: No results found.  Medications:  Scheduled: . heparin  5,000 Units Subcutaneous Q8H  . hydrOXYzine  25 mg Oral QHS  . imipenem-cilastatin  500 mg Intravenous Q8H  . insulin aspart  0-9 Units Subcutaneous Q4H  . ondansetron (ZOFRAN) IV  4 mg Intravenous Q6H   Or  . ondansetron  4 mg Oral Q6H  . pantoprazole (PROTONIX) IV  40 mg  Intravenous Q24H   Continuous: . dextrose 5 % and 0.45 % NaCl with KCl 20 mEq/L 75 mL/hr at 09/16/12 0907   UUV:[id number], acetaminophen, ALPRAZolam, alum & mag hydroxide-simeth, HYDROmorphone (DILAUDID) injection, lip balm, morphine injection, ondansetron (ZOFRAN) IV  Assessment/Plan:  Active Problems:   Nausea and vomiting   Abdominal pain   Colon wall thickening   Abnormal CT scan, pelvis   Hypertension    Abdominal pain  CT scan suggested partial small bowel obstruction.  F/u abd xray Reported as resolution of small bowel dilatation. No acute findings. However patient continues to have abdominal pain. Will repeat Abd x ray. Might need to involve GI or Surg. Start PPI. Lipase and LFt were normal at admission. Will repeat. UA was normal. It was felt that patient presentation was consistent with mild acute diverticulitis and she was started on Cipro and flagyl. She continued to have nausea and pain and so antibiotics were changed to Primaxin. Presentation not completely consistent with diverticulitis. Will leave on Primaxin for now. She reports colonoscopy just a few months ago at [facility].  Abnormal Urinary Bladder on CT -Showed small focus of the posterior bladder may represent contrast collecting posteriorly versus bladder lesion.  -This can be followed as outpatient.   Colonic wall thickening  -Per CT scan report this could be secondary to the diverticulosis but neoplasm also a possibility.  -Patient has had colonoscopy in August of 2012 or 2013?, reported abnormal findings only was diverticulosis.  - Most likely due to infectious etiology resulting in inflammatory changes. Will treat with IV antibiotics and reassess as indicated above.  Might need to involve GI.  Hypertension  Continue to monitor.  DVT Prophylaxis Heparin  Code Status: full Code Family Communication: No family at bedside. Discussed with patient  Disposition Plan: Pending further improvement in  clinical condition.     LOS: 3 days   [facility]  [facility] Pager [phone] 09/17/2012, 9:52 AM  If 8PM-8AM, please contact night-coverage at [email or web address], password [facility]

## 2012-09-17 NOTE — Progress Notes (Signed)
Walked with patient about half way up the hall way, tolerated fairly.  Patient pulse ox is 97% on room air, removed oxygen at this time.  Patient currently sitting up in the chair.  Patient stated that she does feel some relief with belching after taking protonix.  Will continue to monitor.

## 2012-09-18 LAB — BASIC METABOLIC PANEL
BUN: 7 mg/dL (ref 6–23)
CO2: 27 mEq/L (ref 19–32)
Chloride: 101 mEq/L (ref 96–112)
Glucose, Bld: 152 mg/dL — ABNORMAL HIGH (ref 70–99)
Potassium: 3.5 mEq/L (ref 3.5–5.1)

## 2012-09-18 LAB — GLUCOSE, CAPILLARY
Glucose-Capillary: 124 mg/dL — ABNORMAL HIGH (ref 70–99)
Glucose-Capillary: 136 mg/dL — ABNORMAL HIGH (ref 70–99)
Glucose-Capillary: 148 mg/dL — ABNORMAL HIGH (ref 70–99)
Glucose-Capillary: 160 mg/dL — ABNORMAL HIGH (ref 70–99)
Glucose-Capillary: 162 mg/dL — ABNORMAL HIGH (ref 70–99)
Glucose-Capillary: 164 mg/dL — ABNORMAL HIGH (ref 70–99)

## 2012-09-18 LAB — CBC
HCT: 32.9 % — ABNORMAL LOW (ref 36.0–46.0)
Hemoglobin: 11.3 g/dL — ABNORMAL LOW (ref 12.0–15.0)
WBC: 11.2 10*3/uL — ABNORMAL HIGH (ref 4.0–10.5)

## 2012-09-18 MED ORDER — POLYETHYLENE GLYCOL 3350 17 G PO PACK
17.0000 g | PACK | Freq: Two times a day (BID) | ORAL | Status: DC
  Administered 2012-09-18 (×2): 17 g via ORAL
  Filled 2012-09-18 (×6): qty 1

## 2012-09-18 MED ORDER — SODIUM CHLORIDE 0.9 % IV SOLN
250.0000 mg | Freq: Four times a day (QID) | INTRAVENOUS | Status: DC
  Administered 2012-09-18 – 2012-09-22 (×16): 250 mg via INTRAVENOUS
  Filled 2012-09-18 (×19): qty 250

## 2012-09-18 NOTE — Progress Notes (Signed)
Pulled foley catheter out at this time 1230pm.  Instructed patient to call if she needs assistance going to the bedside commode.  Call bell within reach.  Will monitor.

## 2012-09-18 NOTE — Progress Notes (Signed)
[facility] PROGRESS NOTE  [name] NWG:[id number] DOB: May 06, 1941 DOA: 09/14/2012  PCP: [name], MD  Brief HPI: Alice Anthony is a 72 y.o. female with past medical history of hypertension and diverticulosis. Patient came to the hospital with abdominal pain. Patient said she was in her usual state of health till the morning of admission, when she started to have some cramping in her lower abdomen. She had nausea and she vomited more than 5 times. Patient had regular bowel movements in the morning, after that started to be mushy. She also had low-grade fever documented in the ED with temperature of 99.8. Patient has known diverticulosis, she said she had a bout of diverticulitis before which was treated at home. Initial evaluation in the emergency department with CT scan showed collection of fluids in the pelvis, thickening of the colon and questionable partial small bowel obstruction. Patient admitted to the hospital for further evaluation.  Past medical history:  Past Medical History  Diagnosis Date  . Diverticulosis   . Hypertension   . Hyperlipemia     Consultants: [facility]  Procedures: None  Antibiotics: Primaxin 2/11--> Was on Cipro/Flagyl initially  Subjective: Patient reports slight improvement. Not burping as much. No BM. Abdominal pain is about the same.  Objective: Vital Signs  Filed Vitals:   09/17/12 1518 09/17/12 1533 09/17/12 2100 09/18/12 0525  BP:  126/58 113/64 116/71  Pulse:  93 98 91  Temp: 98.9 F (37.2 C)  98.2 F (36.8 C) 98.9 F (37.2 C)  TempSrc:   Oral Oral  Resp:      Height:      Weight:      SpO2:  97% 97% 96%    Intake/Output Summary (Last 24 hours) at 09/18/12 1152 Last data filed at 09/18/12 0020  Gross per 24 hour  Intake   1100 ml  Output   1650 ml  Net   -550 ml   Filed Weights   09/15/12 0500  Weight: 63.504 kg (140 lb)    Intake/Output from previous day: 02/12 0701 - 02/13 0700 In: 1220 [P.O.:120;  I.V.:800; IV Piggyback:300] Out: 2100 [Urine:2100]  General appearance: alert, cooperative, appears stated age and no distress Head: Normocephalic, without obvious abnormality, atraumatic Resp: clear to auscultation bilaterally Cardio: regular rate and rhythm, S1, S2 normal, no murmur, click, rub or gallop GI: soft, Tender in lower abdomen. Without rebound. No masses or organomegaly. BS sluggish.  Extremities: extremities normal, atraumatic, no cyanosis or edema Pulses: 2+ and symmetric Neurologic: Alert and oriented x 3. No focal deficits.  Lab Results:  Basic Metabolic Panel:  Recent Labs Lab 09/14/12 1535 09/15/12 0510 09/18/12 0525  NA 141 138 134*  K 3.3* 3.4* 3.5  CL 102 105 101  CO2 22 21 27   GLUCOSE 161* 177* 152*  BUN 15 13 7   CREATININE 0.77 0.81 0.67  CALCIUM 9.2 8.0* 7.8*   Liver Function Tests:  Recent Labs Lab 09/14/12 1535 09/17/12 0518  AST 27 36  ALT 27 14  ALKPHOS 59 52  BILITOT 0.5 0.3  PROT 7.0 6.0  ALBUMIN 4.3 2.8*    Recent Labs Lab 09/14/12 1535 09/17/12 0518  LIPASE 20 11   CBC:  Recent Labs Lab 09/14/12 1535 09/15/12 0510 09/16/12 1441 09/17/12 0518 09/18/12 0525  WBC 15.1* 16.7* 16.4* 13.1* 11.2*  NEUTROABS 13.2*  --   --   --   --   HGB 14.1 12.5 12.1 11.7* 11.3*  HCT 41.0 37.0 35.8* 34.3*  32.9*  MCV 92.1 93.4 93.5 93.2 93.2  PLT 204 176 159 160 184   CBG:  Recent Labs Lab 2012/09/23 1703 09/23/12 2016 09/18/12 0008 09/18/12 0354 09/18/12 0803  GLUCAP 219* 144* 160* 136* 162*     Studies/Results: Dg Abd 2 Views  September 23, 2012  *RADIOLOGY REPORT*  Clinical Data: Abdominal pain, nausea, vomiting  ABDOMEN - 2 VIEW  Comparison: Abdomen films of 09/15/2012 and CT abdomen pelvis of 09/14/2012  Findings: Supine and left lateral decubitus films of the abdomen show no bowel obstruction.  Some contrast is noted throughout the nondistended colon.  No free air is seen on the left lateral decubitus image.  IMPRESSION: No bowel  obstruction.  No free air.   Original Report Authenticated By: Dwyane Dee, M.D.     Medications:  Scheduled: . fluticasone  1 spray Each Nare QHS  . heparin  5,000 Units Subcutaneous Q8H  . hydrOXYzine  25 mg Oral QHS  . imipenem-cilastatin  250 mg Intravenous Q6H  . insulin aspart  0-9 Units Subcutaneous Q4H  . ondansetron (ZOFRAN) IV  4 mg Intravenous Q6H   Or  . ondansetron  4 mg Oral Q6H  . pantoprazole (PROTONIX) IV  40 mg Intravenous Q24H  . polyethylene glycol  17 g Oral BID   Continuous: . dextrose 5 % and 0.45 % NaCl with KCl 20 mEq/L 75 mL/hr (09/18/12 1005)   ZOX:[id number], acetaminophen, ALPRAZolam, alum & mag hydroxide-simeth, HYDROmorphone (DILAUDID) injection, lip balm, morphine injection, ondansetron (ZOFRAN) IV  Assessment/Plan:  Active Problems:   Nausea and vomiting   Abdominal pain   Colon wall thickening   Abnormal CT scan, pelvis   Hypertension    Abdominal pain  Appreciate GI input. Symptoms thought to be secondary to diverticulitis. Continue imipenem. CT scan suggested partial small bowel obstruction.  F/u abd xray reported as resolution of small bowel dilatation. She reports colonoscopy just a few months ago at [facility]. If symptoms do not improve will need to repeat CT.  Abnormal Urinary Bladder on CT -Showed small focus of the posterior bladder may represent contrast collecting posteriorly versus bladder lesion.  -This can be followed as outpatient.   Colonic wall thickening  Per CT scan report this could be secondary to the diverticulosis but neoplasm also a possibility. Patient has had colonoscopy in August of 2012 or 2013?, reported abnormal findings only was diverticulosis. Most likely due to infectious etiology resulting in inflammatory changes. Will treat with IV antibiotics and reassess as indicated above.   Hypertension  Continue to monitor.  DVT Prophylaxis Heparin  Code Status: full Code Family Communication: No family at  bedside. Discussed with patient  Disposition Plan: Pending further improvement in clinical condition.     LOS: 4 days   [facility]  [facility] Pager [phone] 09/18/2012, 11:52 AM  If 8PM-8AM, please contact night-coverage at [email or web address], password [facility]

## 2012-09-18 NOTE — Progress Notes (Signed)
Patient expressed hopelessness in her diagnosis, and wondered if she was causing her sickness, I reassured her and told her that she was in very good care at [facility], She wants to have a bowel movement and suggested a Enema, I will continue to monitor

## 2012-09-18 NOTE — Consult Note (Signed)
[facility] CONSULT Reason for consult: Abdominal pain N+V Referring Physician: [facility]. PCP: Dr. Morton Peters Alice Anthony is an 72 y.o. female.  HPI: 72 year old white female who was in her usual state of health until 4 days ago she began to experience a good abdominal pain and cramping somewhat looser stools. No fever chills with slight nausea progress in the more severe nausea with occasional bomb. The patient had screening colonoscopy 8/13 revealing moderate diverticulosis. She has had increase belching but not increase flatus. The only abdominal surgery that the patient has had in the past as a tubal ligation. CT scan obtain in the emergency room on a mission revealed slightly dilated small bowel with decompression of the distal small bowel and thickening of the recto signal would diverticulosis without Frank diverticulitis. WBC elevated at 16.4. Patient started on imipenem. No prior history of active diverticulitis.  Past Medical History  Diagnosis Date  . Diverticulosis   . Hypertension   . Hyperlipemia     Past Surgical History  Procedure Laterality Date  . Tubal ligation    . Tonsillectomy      Family History  Problem Relation Age of Onset  . Diabetes Father   . Basal cell carcinoma Mother     Metastatic    Social History:  reports that she has never smoked. She has never used smokeless tobacco. She reports that she does not drink alcohol or use illicit drugs.  Allergies:  Allergies  Allergen Reactions  . Codeine   . Fosamax (Alendronate Sodium)   . Latex   . Vioxx (Rofecoxib)     Medications; . fluticasone  1 spray Each Nare QHS  . heparin  5,000 Units Subcutaneous Q8H  . hydrOXYzine  25 mg Oral QHS  . imipenem-cilastatin  500 mg Intravenous Q8H  . insulin aspart  0-9 Units Subcutaneous Q4H  . ondansetron (ZOFRAN) IV  4 mg Intravenous Q6H   Or  . ondansetron  4 mg Oral Q6H  . pantoprazole (PROTONIX) IV  40 mg Intravenous Q24H   PRN  Meds acetaminophen, acetaminophen, ALPRAZolam, alum & mag hydroxide-simeth, HYDROmorphone (DILAUDID) injection, lip balm, morphine injection, ondansetron (ZOFRAN) IV Results for orders placed during the hospital encounter of 09/14/12 (from the past 48 hour(s))  GLUCOSE, CAPILLARY     Status: Abnormal   Collection Time    09/16/12 11:36 AM      Result Value Range   Glucose-Capillary 156 (*) 70 - 99 mg/dL   Comment 1 Notify RN    CBC     Status: Abnormal   Collection Time    09/16/12  2:41 PM      Result Value Range   WBC 16.4 (*) 4.0 - 10.5 K/uL   RBC 3.83 (*) 3.87 - 5.11 MIL/uL   Hemoglobin 12.1  12.0 - 15.0 g/dL   HCT 95.6 (*) 21.3 - 08.6 %   MCV 93.5  78.0 - 100.0 fL   MCH 31.6  26.0 - 34.0 pg   MCHC 33.8  30.0 - 36.0 g/dL   RDW 57.8  46.9 - 62.9 %   Platelets 159  150 - 400 K/uL  GLUCOSE, CAPILLARY     Status: Abnormal   Collection Time    09/16/12  4:36 PM      Result Value Range   Glucose-Capillary 157 (*) 70 - 99 mg/dL   Comment 1 Notify RN    GLUCOSE, CAPILLARY     Status: Abnormal   Collection Time  09/16/12  8:14 PM      Result Value Range   Glucose-Capillary 126 (*) 70 - 99 mg/dL  GLUCOSE, CAPILLARY     Status: Abnormal   Collection Time    09/16/12 11:57 PM      Result Value Range   Glucose-Capillary 118 (*) 70 - 99 mg/dL  GLUCOSE, CAPILLARY     Status: Abnormal   Collection Time    09/17/12  4:13 AM      Result Value Range   Glucose-Capillary 172 (*) 70 - 99 mg/dL  CBC     Status: Abnormal   Collection Time    09/17/12  5:18 AM      Result Value Range   WBC 13.1 (*) 4.0 - 10.5 K/uL   RBC 3.68 (*) 3.87 - 5.11 MIL/uL   Hemoglobin 11.7 (*) 12.0 - 15.0 g/dL   HCT 14.7 (*) 82.9 - 56.2 %   MCV 93.2  78.0 - 100.0 fL   MCH 31.8  26.0 - 34.0 pg   MCHC 34.1  30.0 - 36.0 g/dL   RDW 13.0  86.5 - 78.4 %   Platelets 160  150 - 400 K/uL  HEPATIC FUNCTION PANEL     Status: Abnormal   Collection Time    09/17/12  5:18 AM      Result Value Range   Total Protein  6.0  6.0 - 8.3 g/dL   Albumin 2.8 (*) 3.5 - 5.2 g/dL   AST 36  0 - 37 U/L   ALT 14  0 - 35 U/L   Alkaline Phosphatase 52  39 - 117 U/L   Total Bilirubin 0.3  0.3 - 1.2 mg/dL   Bilirubin, Direct <6.9  0.0 - 0.3 mg/dL   Indirect Bilirubin NOT CALCULATED  0.3 - 0.9 mg/dL  LIPASE, BLOOD     Status: None   Collection Time    09/17/12  5:18 AM      Result Value Range   Lipase 11  11 - 59 U/L  GLUCOSE, CAPILLARY     Status: Abnormal   Collection Time    09/17/12  7:43 AM      Result Value Range   Glucose-Capillary 131 (*) 70 - 99 mg/dL   Comment 1 Notify RN    GLUCOSE, CAPILLARY     Status: Abnormal   Collection Time    09/17/12 11:53 AM      Result Value Range   Glucose-Capillary 153 (*) 70 - 99 mg/dL   Comment 1 Notify RN    GLUCOSE, CAPILLARY     Status: Abnormal   Collection Time    09/17/12  5:03 PM      Result Value Range   Glucose-Capillary 219 (*) 70 - 99 mg/dL   Comment 1 Notify RN    GLUCOSE, CAPILLARY     Status: Abnormal   Collection Time    09/17/12  8:16 PM      Result Value Range   Glucose-Capillary 144 (*) 70 - 99 mg/dL  GLUCOSE, CAPILLARY     Status: Abnormal   Collection Time    09/18/12 12:08 AM      Result Value Range   Glucose-Capillary 160 (*) 70 - 99 mg/dL   Comment 1 Notify RN    GLUCOSE, CAPILLARY     Status: Abnormal   Collection Time    09/18/12  3:54 AM      Result Value Range   Glucose-Capillary 136 (*) 70 - 99 mg/dL  CBC     Status: Abnormal   Collection Time    09/18/12  5:25 AM      Result Value Range   WBC 11.2 (*) 4.0 - 10.5 K/uL   RBC 3.53 (*) 3.87 - 5.11 MIL/uL   Hemoglobin 11.3 (*) 12.0 - 15.0 g/dL   HCT 78.2 (*) 95.6 - 21.3 %   MCV 93.2  78.0 - 100.0 fL   MCH 32.0  26.0 - 34.0 pg   MCHC 34.3  30.0 - 36.0 g/dL   RDW 08.6  57.8 - 46.9 %   Platelets 184  150 - 400 K/uL  BASIC METABOLIC PANEL     Status: Abnormal   Collection Time    09/18/12  5:25 AM      Result Value Range   Sodium 134 (*) 135 - 145 mEq/L   Potassium 3.5   3.5 - 5.1 mEq/L   Chloride 101  96 - 112 mEq/L   CO2 27  19 - 32 mEq/L   Glucose, Bld 152 (*) 70 - 99 mg/dL   BUN 7  6 - 23 mg/dL   Creatinine, Ser 6.29  0.50 - 1.10 mg/dL   Calcium 7.8 (*) 8.4 - 10.5 mg/dL   GFR calc non Af Amer 87 (*) >90 mL/min   GFR calc Af Amer >90  >90 mL/min   Comment:            The eGFR has been calculated     using the CKD EPI equation.     This calculation has not been     validated in all clinical     situations.     eGFR's persistently     <90 mL/min signify     possible Chronic Kidney Disease.  GLUCOSE, CAPILLARY     Status: Abnormal   Collection Time    09/18/12  8:03 AM      Result Value Range   Glucose-Capillary 162 (*) 70 - 99 mg/dL   Comment 1 Notify RN      Dg Abd 2 Views  09/17/2012  *RADIOLOGY REPORT*  Clinical Data: Abdominal pain, nausea, vomiting  ABDOMEN - 2 VIEW  Comparison: Abdomen films of 09/15/2012 and CT abdomen pelvis of 09/14/2012  Findings: Supine and left lateral decubitus films of the abdomen show no bowel obstruction.  Some contrast is noted throughout the nondistended colon.  No free air is seen on the left lateral decubitus image.  IMPRESSION: No bowel obstruction.  No free air.   Original Report Authenticated By: Dwyane Dee, M.D.    ROS: Constitutional: essentially negative. She felt fine until 4 days ago HEENT: negative Cardiovascular: negative Respiratory: negative, no cough GI: as above. No prior history of diverticulitis GU: negative Musculoskeletal: negative Neuro/Psychiatric: negative Endocrine/Heme:            Blood pressure 116/71, pulse 91, temperature 98.9 F (37.2 C), temperature source Oral, resp. rate 18, height 5\' 2"  (1.575 m), weight 63.504 kg (140 lb), SpO2 96.00%.  Physical exam:   Gen. -- alert white female and no acute distress Eyes -- EOM contact sclera nonicteric Lungs -- clear Heart -- regular rate and rhythm without murmurs redoubts Abdomen -- nondistended with bowel sounds  present. Tender primarily on the left side in both the left upper and left lower quadrant Assessment: 1. Abdominal pain, increase WBC. CT nondiagnostic. Patient is tender on the left side and has known history of diverticulosis. I think treatment for diverticulitis is appropriate with close clinical  follow-up.  Plan: we will follow with you. At this point I would continue with imipenem and clear liquids. She does not improve, she may need another CT scan to evaluate for small bowel obstruction. If this is required would suggest CT interrogative. I'll go ahead and empirically start her on some Miralax to keep her stool soft.   Alice Anthony JR,Vonn Sliger L 09/18/2012, 8:57 AM

## 2012-09-19 ENCOUNTER — Inpatient Hospital Stay (HOSPITAL_COMMUNITY): Payer: Medicare Other | Admitting: Certified Registered"

## 2012-09-19 ENCOUNTER — Encounter (HOSPITAL_COMMUNITY): Payer: Self-pay | Admitting: Certified Registered"

## 2012-09-19 ENCOUNTER — Inpatient Hospital Stay (HOSPITAL_COMMUNITY): Payer: Medicare Other

## 2012-09-19 ENCOUNTER — Encounter (HOSPITAL_COMMUNITY): Payer: Self-pay | Admitting: Radiology

## 2012-09-19 ENCOUNTER — Encounter (HOSPITAL_COMMUNITY): Admission: EM | Disposition: A | Discharge status: Home Health | Payer: Self-pay | Source: Home / Self Care

## 2012-09-19 DIAGNOSIS — K5732 Diverticulitis of large intestine without perforation or abscess without bleeding: Secondary | ICD-10-CM

## 2012-09-19 DIAGNOSIS — K631 Perforation of intestine (nontraumatic): Secondary | ICD-10-CM

## 2012-09-19 HISTORY — PX: ABDOMINAL SURGERY: SHX537

## 2012-09-19 HISTORY — PX: LAPAROTOMY: SHX154

## 2012-09-19 LAB — BASIC METABOLIC PANEL
CO2: 28 mEq/L (ref 19–32)
Calcium: 8 mg/dL — ABNORMAL LOW (ref 8.4–10.5)
GFR calc non Af Amer: 87 mL/min — ABNORMAL LOW (ref >90)
Glucose, Bld: 154 mg/dL — ABNORMAL HIGH (ref 70–99)
Potassium: 4.1 mEq/L (ref 3.5–5.1)
Sodium: 135 mEq/L (ref 135–145)

## 2012-09-19 LAB — GLUCOSE, CAPILLARY
Glucose-Capillary: 161 mg/dL — ABNORMAL HIGH (ref 70–99)
Glucose-Capillary: 165 mg/dL — ABNORMAL HIGH (ref 70–99)

## 2012-09-19 LAB — CBC
HCT: 34.1 % — ABNORMAL LOW (ref 36.0–46.0)
MCH: 32 pg (ref 26.0–34.0)
MCHC: 34.6 g/dL (ref 30.0–36.0)
Platelets: ADEQUATE 10*3/uL (ref 150–400)
WBC: 12.1 10*3/uL — ABNORMAL HIGH (ref 4.0–10.5)

## 2012-09-19 LAB — SURGICAL PCR SCREEN: MRSA, PCR: NEGATIVE

## 2012-09-19 SURGERY — LAPAROTOMY, EXPLORATORY
Anesthesia: General | Wound class: Dirty or Infected

## 2012-09-19 MED ORDER — SODIUM CHLORIDE 0.9 % IJ SOLN: 9.0000 mL | INTRAMUSCULAR | Status: DC | PRN

## 2012-09-19 MED ORDER — MIDAZOLAM HCL 5 MG/5ML IJ SOLN
INTRAMUSCULAR | Status: DC | PRN
  Administered 2012-09-19: 2 mg via INTRAVENOUS

## 2012-09-19 MED ORDER — IOHEXOL 300 MG/ML  SOLN
100.0000 mL | Freq: Once | INTRAMUSCULAR | Status: AC | PRN
  Administered 2012-09-19: 100 mL via INTRAVENOUS

## 2012-09-19 MED ORDER — LACTATED RINGERS IV SOLN
INTRAVENOUS | Status: DC | PRN
  Administered 2012-09-19 (×2): via INTRAVENOUS

## 2012-09-19 MED ORDER — GLYCOPYRROLATE 0.2 MG/ML IJ SOLN
INTRAMUSCULAR | Status: DC | PRN
  Administered 2012-09-19: .5 mg via INTRAVENOUS

## 2012-09-19 MED ORDER — 0.9 % SODIUM CHLORIDE (POUR BTL) OPTIME
TOPICAL | Status: DC | PRN
  Administered 2012-09-19: 10000 mL

## 2012-09-19 MED ORDER — SUCCINYLCHOLINE CHLORIDE 20 MG/ML IJ SOLN
INTRAMUSCULAR | Status: DC | PRN
  Administered 2012-09-19: 100 mg via INTRAVENOUS

## 2012-09-19 MED ORDER — NALOXONE HCL 0.4 MG/ML IJ SOLN: 0.4000 mg | INTRAMUSCULAR | Status: DC | PRN

## 2012-09-19 MED ORDER — LIDOCAINE HCL (PF) 2 % IJ SOLN
INTRAMUSCULAR | Status: DC | PRN
  Administered 2012-09-19: 20 mg

## 2012-09-19 MED ORDER — SODIUM CHLORIDE 0.9 % IV SOLN: 1.0000 g | INTRAVENOUS | Status: DC | PRN

## 2012-09-19 MED ORDER — HYDROMORPHONE HCL PF 1 MG/ML IJ SOLN
INTRAMUSCULAR | Status: DC | PRN
  Administered 2012-09-19 (×2): 0.5 mg via INTRAVENOUS
  Administered 2012-09-19: 1 mg via INTRAVENOUS

## 2012-09-19 MED ORDER — PHENOL 1.4 % MT LIQD
1.0000 | OROMUCOSAL | Status: DC | PRN
  Administered 2012-09-19: 1 via OROMUCOSAL
  Filled 2012-09-19: qty 177

## 2012-09-19 MED ORDER — DIPHENHYDRAMINE HCL 50 MG/ML IJ SOLN: 12.5000 mg | Freq: Four times a day (QID) | INTRAMUSCULAR | Status: DC | PRN

## 2012-09-19 MED ORDER — IOHEXOL 300 MG/ML  SOLN
25.0000 mL | INTRAMUSCULAR | Status: AC
  Administered 2012-09-19 (×2): 25 mL via ORAL

## 2012-09-19 MED ORDER — LACTATED RINGERS IV SOLN: INTRAVENOUS | Status: DC

## 2012-09-19 MED ORDER — PROPOFOL 10 MG/ML IV BOLUS
INTRAVENOUS | Status: DC | PRN
  Administered 2012-09-19: 100 mg via INTRAVENOUS

## 2012-09-19 MED ORDER — ROCURONIUM BROMIDE 100 MG/10ML IV SOLN
INTRAVENOUS | Status: DC | PRN
  Administered 2012-09-19 (×2): 10 mg via INTRAVENOUS
  Administered 2012-09-19: 30 mg via INTRAVENOUS

## 2012-09-19 MED ORDER — HYDROMORPHONE 0.3 MG/ML IV SOLN
INTRAVENOUS | Status: DC
  Administered 2012-09-19: 0.3 mg via INTRAVENOUS
  Administered 2012-09-19: 20:00:00 via INTRAVENOUS
  Administered 2012-09-20: 0.9 mg via INTRAVENOUS
  Administered 2012-09-20: 21:00:00 via INTRAVENOUS
  Administered 2012-09-20: 1.8 mg via INTRAVENOUS
  Administered 2012-09-20: 1.2 mg via INTRAVENOUS
  Administered 2012-09-20: 0.9 mg via INTRAVENOUS
  Administered 2012-09-20: 1.2 mg via INTRAVENOUS
  Administered 2012-09-21: 0.6 mg via INTRAVENOUS
  Administered 2012-09-21: 0.36 mg via INTRAVENOUS
  Administered 2012-09-21: 0.479 mg via INTRAVENOUS
  Administered 2012-09-21: 0.9 mg via INTRAVENOUS
  Administered 2012-09-21: 3 mg via INTRAVENOUS
  Administered 2012-09-21: 1.2 mg via INTRAVENOUS
  Administered 2012-09-22: 0.289 mg via INTRAVENOUS
  Administered 2012-09-22: 0.6 mg via INTRAVENOUS
  Administered 2012-09-22: 07:00:00 via INTRAVENOUS
  Administered 2012-09-22: 0.6 mg via INTRAVENOUS
  Filled 2012-09-19 (×2): qty 25

## 2012-09-19 MED ORDER — LACTATED RINGERS IV SOLN
INTRAVENOUS | Status: DC | PRN
  Administered 2012-09-19: 16:00:00 via INTRAVENOUS

## 2012-09-19 MED ORDER — MUPIROCIN 2 % EX OINT
TOPICAL_OINTMENT | Freq: Two times a day (BID) | CUTANEOUS | Status: DC
  Administered 2012-09-19 – 2012-09-20 (×3): via NASAL
  Administered 2012-09-21: 1 via NASAL
  Filled 2012-09-19: qty 22

## 2012-09-19 MED ORDER — KCL IN DEXTROSE-NACL 20-5-0.45 MEQ/L-%-% IV SOLN
INTRAVENOUS | Status: DC
  Administered 2012-09-19: 21:00:00 via INTRAVENOUS
  Administered 2012-09-20: 125 mL/h via INTRAVENOUS
  Administered 2012-09-20 – 2012-09-21 (×2): via INTRAVENOUS
  Filled 2012-09-19 (×6): qty 1000

## 2012-09-19 MED ORDER — ACETAMINOPHEN 10 MG/ML IV SOLN
INTRAVENOUS | Status: DC | PRN
  Administered 2012-09-19: 1000 mg via INTRAVENOUS

## 2012-09-19 MED ORDER — ONDANSETRON HCL 4 MG/2ML IJ SOLN
INTRAMUSCULAR | Status: DC | PRN
  Administered 2012-09-19: 4 mg via INTRAVENOUS

## 2012-09-19 MED ORDER — NEOSTIGMINE METHYLSULFATE 1 MG/ML IJ SOLN
INTRAMUSCULAR | Status: DC | PRN
  Administered 2012-09-19: 3.5 mg via INTRAVENOUS

## 2012-09-19 MED ORDER — ONDANSETRON HCL 4 MG/2ML IJ SOLN
4.0000 mg | Freq: Four times a day (QID) | INTRAMUSCULAR | Status: DC | PRN
  Administered 2012-09-19 – 2012-09-20 (×2): 4 mg via INTRAVENOUS
  Filled 2012-09-19 (×3): qty 2

## 2012-09-19 MED ORDER — HYDROMORPHONE HCL PF 1 MG/ML IJ SOLN
0.2500 mg | INTRAMUSCULAR | Status: DC | PRN
  Administered 2012-09-19 (×2): 0.5 mg via INTRAVENOUS

## 2012-09-19 MED ORDER — DIPHENHYDRAMINE HCL 12.5 MG/5ML PO ELIX
12.5000 mg | ORAL_SOLUTION | Freq: Four times a day (QID) | ORAL | Status: DC | PRN
  Filled 2012-09-19: qty 5

## 2012-09-19 MED ORDER — SODIUM CHLORIDE 0.9 % IV SOLN
1.0000 g | INTRAVENOUS | Status: DC | PRN
  Administered 2012-09-19: 1 g via INTRAVENOUS

## 2012-09-19 MED ORDER — FENTANYL CITRATE 0.05 MG/ML IJ SOLN
INTRAMUSCULAR | Status: DC | PRN
  Administered 2012-09-19 (×2): 50 ug via INTRAVENOUS
  Administered 2012-09-19: 100 ug via INTRAVENOUS
  Administered 2012-09-19: 50 ug via INTRAVENOUS

## 2012-09-19 SURGICAL SUPPLY — 54 items
APPLICATOR COTTON TIP 6IN STRL (MISCELLANEOUS) ×2 IMPLANT
BANDAGE GAUZE ELAST BULKY 4 IN (GAUZE/BANDAGES/DRESSINGS) ×2 IMPLANT
BLADE EXTENDED COATED 6.5IN (ELECTRODE) ×2 IMPLANT
BLADE HEX COATED 2.75 (ELECTRODE) ×2 IMPLANT
CANISTER SUCTION 2500CC (MISCELLANEOUS) ×2 IMPLANT
CATH FOLEY LATEX FREE 16FR (CATHETERS) ×2 IMPLANT
CLOTH BEACON ORANGE TIMEOUT ST (SAFETY) ×2 IMPLANT
COVER MAYO STAND STRL (DRAPES) ×2 IMPLANT
DRAIN CHANNEL 19F RND (DRAIN) ×2 IMPLANT
DRAPE LAPAROSCOPIC ABDOMINAL (DRAPES) ×2 IMPLANT
DRAPE WARM FLUID 44X44 (DRAPE) ×2 IMPLANT
DRSG PAD ABDOMINAL 8X10 ST (GAUZE/BANDAGES/DRESSINGS) ×6 IMPLANT
ELECT REM PT RETURN 9FT ADLT (ELECTROSURGICAL) ×2
ELECTRODE REM PT RTRN 9FT ADLT (ELECTROSURGICAL) ×1 IMPLANT
EVACUATOR DRAINAGE 10X20 100CC (DRAIN) ×1 IMPLANT
EVACUATOR SILICONE 100CC (DRAIN) ×1
GLOVE BIOGEL PI IND STRL 7.0 (GLOVE) ×1 IMPLANT
GLOVE BIOGEL PI INDICATOR 7.0 (GLOVE) ×1
GLOVE INDICATOR 8.0 STRL GRN (GLOVE) ×4 IMPLANT
GLOVE SS BIOGEL STRL SZ 8 (GLOVE) ×1 IMPLANT
GLOVE SUPERSENSE BIOGEL SZ 8 (GLOVE) ×1
GOWN STRL NON-REIN LRG LVL3 (GOWN DISPOSABLE) ×2 IMPLANT
GOWN STRL REIN XL XLG (GOWN DISPOSABLE) ×4 IMPLANT
KIT BASIN OR (CUSTOM PROCEDURE TRAY) ×2 IMPLANT
LIGASURE IMPACT 36 18CM CVD LR (INSTRUMENTS) ×2 IMPLANT
MANIFOLD NEPTUNE II (INSTRUMENTS) ×2 IMPLANT
NS IRRIG 1000ML POUR BTL (IV SOLUTION) ×2 IMPLANT
PACK GENERAL/GYN (CUSTOM PROCEDURE TRAY) ×2 IMPLANT
RELOAD PROXIMATE 75MM BLUE (ENDOMECHANICALS) ×2 IMPLANT
SEPRAFILM PROCEDURAL PACK 3X5 (MISCELLANEOUS) ×2 IMPLANT
SPONGE GAUZE 4X4 12PLY (GAUZE/BANDAGES/DRESSINGS) ×2 IMPLANT
SPONGE LAP 18X18 X RAY DECT (DISPOSABLE) ×4 IMPLANT
STAPLER GUN LINEAR PROX 60 (STAPLE) ×2 IMPLANT
STAPLER PROXIMATE 75MM BLUE (STAPLE) ×2 IMPLANT
STAPLER VISISTAT 35W (STAPLE) IMPLANT
SUCTION POOLE TIP (SUCTIONS) ×2 IMPLANT
SUT ETHILON 2 0 PS N (SUTURE) ×2 IMPLANT
SUT ETHILON 3 0 PS 1 (SUTURE) ×2 IMPLANT
SUT PDS AB 1 CTX 36 (SUTURE) ×2 IMPLANT
SUT PDS AB 1 TP1 96 (SUTURE) ×4 IMPLANT
SUT PROLENE 2 0 SH DA (SUTURE) ×2 IMPLANT
SUT SILK 2 0 (SUTURE) ×1
SUT SILK 2-0 18XBRD TIE 12 (SUTURE) ×1 IMPLANT
SUT SILK 3 0 (SUTURE) ×1
SUT SILK 3 0 SH 30 (SUTURE) ×2 IMPLANT
SUT SILK 3 0 SH CR/8 (SUTURE) IMPLANT
SUT SILK 3-0 18XBRD TIE 12 (SUTURE) ×1 IMPLANT
SUT VIC AB 2-0 SH 18 (SUTURE) IMPLANT
SUT VIC AB 3-0 SH 18 (SUTURE) ×4 IMPLANT
TAPE CLOTH SURG 4X10 WHT LF (GAUZE/BANDAGES/DRESSINGS) ×2 IMPLANT
TOWEL OR 17X26 10 PK STRL BLUE (TOWEL DISPOSABLE) ×4 IMPLANT
TRAY FOLEY CATH 14FRSI W/METER (CATHETERS) ×2 IMPLANT
TRAY PREP A LATEX SAFE STRL (SET/KITS/TRAYS/PACK) ×2 IMPLANT
YANKAUER SUCT BULB TIP NO VENT (SUCTIONS) ×2 IMPLANT

## 2012-09-19 NOTE — H&P (View-Only) (Signed)
CT reviewed.  Microperforation noted but minimal contamination. Pain not any better.  Not septic.  Discussed colectomy colostomy vs non operative treatments which she has failed.  Risk,  Benefits and alternative treatments discussed,  The procedure was discussed with the patient.   partial colectomy and colostomy  discussed with the patient as well as non operative treatments. The risks of operative management include bleeding,  Infection,  Leak of anastamosis,  Ostomy formation, open procedure,  Sepsis,  Abcess,  Hernia,  DVT,  Pulmonary complications,  Cardiovascular  complications,  Injury to ureter,  Bladder,kidney,and anesthesia risks,  And death. The patient understands.  Questions answered.   The success of the procedure is 50-85 % for treating the patients symptoms. They agree to proceed.

## 2012-09-19 NOTE — Progress Notes (Signed)
[facility] PROGRESS NOTE Subjective Patient notes that he felt really well until about 4 AM when she began to have fairly sudden onset of abdominal pain. Prior to that her pain and been getting better and in almost disappeared. She has not had bowel movement. She states that she feels dehydrated and her mouth was dry but does not feel hungry. She has felt hot the past several hours but has been afebrile.  Objective: Vital signs in last 24 hours: Temp:  [89.6 F (32 C)-99.3 F (37.4 C)] 99.3 F (37.4 C) (02/14 0600) Pulse Rate:  [90-92] 90 (02/14 0600) Resp:  [18-20] 20 (02/14 0600) BP: (135-155)/(69-73) 135/69 mmHg (02/14 0600) SpO2:  [92 %-97 %] 92 % (02/14 0600) Last BM Date: 09/14/12  Intake/Output from previous day: 02/13 0701 - 02/14 0700 In: 2557.5 [P.O.:360; I.V.:1997.5; IV Piggyback:200] Out: 3475 [Urine:3475] Intake/Output this shift: Total I/O In: 0  Out: 300 [Urine:300]  PE: Gen. -- patient appears somewhat sedated but is alert. She appears pale Lungs -- clear Heart -- regular rate and rhythm without murmurs or gallops Abdomen -- slightly distended with few bowel sounds. Marked increase in tenderness possible rebound left abdomen. Lab Results:  Recent Labs  09/16/12 1441 2012/09/26 0518 09/18/12 0525 09/19/12 0445  WBC 16.4* 13.1* 11.2* 12.1*  HGB 12.1 11.7* 11.3* 11.8*  HCT 35.8* 34.3* 32.9* 34.1*  PLT 159 160 184 PLATELET CLUMPS NOTED ON SMEAR, COUNT APPEARS ADEQUATE   BMET  Recent Labs  09/18/12 0525 09/19/12 0445  NA 134* 135  K 3.5 4.1  CL 101 100  CO2 27 28  CREATININE 0.67 0.66   LFT  Recent Labs  09-26-2012 0518  PROT 6.0  AST 36  ALT 14  ALKPHOS 52  BILITOT 0.3  BILIDIR <0.1  IBILI NOT CALCULATED   PT/INR No results found for this basename: LABPROT, INR,  in the last 72 hours PANCREAS  Recent Labs  2012/09/26 0518  LIPASE 11         Studies/Results: Dg Abd 2 Views  2012/09/26  *RADIOLOGY REPORT*  Clinical  Data: Abdominal pain, nausea, vomiting  ABDOMEN - 2 VIEW  Comparison: Abdomen films of 09/15/2012 and CT abdomen pelvis of 09/14/2012  Findings: Supine and left lateral decubitus films of the abdomen show no bowel obstruction.  Some contrast is noted throughout the nondistended colon.  No free air is seen on the left lateral decubitus image.  IMPRESSION: No bowel obstruction.  No free air.   Original Report Authenticated By: Dwyane Dee, M.D.     Medications: I have reviewed the patient's current medications.  Assessment/Plan: 1. Abdominal pain. Have been improving with empiric Treatment for diverticulitis. Patient was almost pain-free yesterday evening with sudden onset of pain at approximately 4 AM in worsening physical exam. What we need to rule out perforation. Go ahead obtain stat abdominal series.   Adorian Gwynne JR,Neha Waight L 09/19/2012, 9:29 AM

## 2012-09-19 NOTE — Interval H&P Note (Signed)
History and Physical Interval Note:  09/19/2012 4:04 PM  Alice Anthony  has presented today for surgery, with the diagnosis of perforated diverticulitis  The various methods of treatment have been discussed with the patient and family. After consideration of risks, benefits and other options for treatment, the patient has consented to  Procedure(s): EXPLORATORY LAPAROTOMY, sigmoid colectomy (N/A) as a surgical intervention .  The patient's history has been reviewed, patient examined, no change in status, stable for surgery.  I have reviewed the patient's chart and labs.  Questions were answered to the patient's satisfaction.     Alecxis Baltzell A.

## 2012-09-19 NOTE — Anesthesia Preprocedure Evaluation (Addendum)
Anesthesia Evaluation  Patient identified by MRN, date of birth, ID band Patient awake    Reviewed: Allergy & Precautions, H&P , NPO status , Patient's Chart, lab work & pertinent test results  Airway Mallampati: II TM Distance: >3 FB Neck ROM: full    Dental no notable dental hx. (+) Teeth Intact and Dental Advisory Given   Pulmonary neg pulmonary ROS,  breath sounds clear to auscultation  Pulmonary exam normal       Cardiovascular Exercise Tolerance: Good hypertension, Pt. on medications negative cardio ROS  Rhythm:regular Rate:Normal     Neuro/Psych negative neurological ROS  negative psych ROS   GI/Hepatic negative GI ROS, Neg liver ROS,   Endo/Other  negative endocrine ROS  Renal/GU negative Renal ROS  negative genitourinary   Musculoskeletal   Abdominal   Peds  Hematology negative hematology ROS (+)   Anesthesia Other Findings   Reproductive/Obstetrics negative OB ROS                          Anesthesia Physical Anesthesia Plan  ASA: II and emergent  Anesthesia Plan: General   Post-op Pain Management:    Induction: Intravenous, Rapid sequence and Cricoid pressure planned  Airway Management Planned: Oral ETT  Additional Equipment:   Intra-op Plan:   Post-operative Plan: Extubation in OR  Informed Consent: I have reviewed the patients History and Physical, chart, labs and discussed the procedure including the risks, benefits and alternatives for the proposed anesthesia with the patient or authorized representative who has indicated his/her understanding and acceptance.   Dental Advisory Given  Plan Discussed with: CRNA and Surgeon  Anesthesia Plan Comments:       Anesthesia Quick Evaluation

## 2012-09-19 NOTE — Anesthesia Postprocedure Evaluation (Signed)
  Anesthesia Post-op Note  Patient: Alice Anthony  Procedure(s) Performed: Procedure(s) (LRB): EXPLORATORY LAPAROTOMY, sigmoid colectomy (N/A)  Patient Location: PACU  Anesthesia Type: General  Level of Consciousness: awake and alert   Airway and Oxygen Therapy: Patient Spontanous Breathing  Post-op Pain: mild  Post-op Assessment: Post-op Vital signs reviewed, Patient's Cardiovascular Status Stable, Respiratory Function Stable, Patent Airway and No signs of Nausea or vomiting  Last Vitals:  Filed Vitals:   09/19/12 2015  BP:   Pulse: 86  Temp: 36.9 C  Resp: 10    Post-op Vital Signs: stable   Complications: No apparent anesthesia complications

## 2012-09-19 NOTE — Transfer of Care (Signed)
Immediate Anesthesia Transfer of Care Note  Patient: Alice Anthony  Procedure(s) Performed: Procedure(s): EXPLORATORY LAPAROTOMY, sigmoid colectomy (N/A)  Patient Location: PACU  Anesthesia Type:General  Level of Consciousness: awake, sedated and patient cooperative  Airway & Oxygen Therapy: Patient Spontanous Breathing and Patient connected to face mask oxygen  Post-op Assessment: Report given to PACU RN and Post -op Vital signs reviewed and stable  Post vital signs: Reviewed and stable  Complications: No apparent anesthesia complications

## 2012-09-19 NOTE — Progress Notes (Signed)
ANTIBIOTIC CONSULT NOTE - INITIAL  Pharmacy Consult for Primaxin Indication: colitis/diverticulitis  Allergies  Allergen Reactions  . Codeine   . Fosamax (Alendronate Sodium)   . Latex   . Vioxx (Rofecoxib)     Patient Measurements: Height: 5\' 2"  (157.5 cm) Weight: 140 lb (63.504 kg) IBW/kg (Calculated) : 50.1   Vital Signs: Temp: 99.3 F (37.4 C) (02/14 0600) Temp src: Oral (02/14 0600) BP: 135/69 mmHg (02/14 0600) Pulse Rate: 90 (02/14 0600) Intake/Output from previous day: 02/13 0701 - 02/14 0700 In: 2557.5 [P.O.:360; I.V.:1997.5; IV Piggyback:200] Out: 3475 [Urine:3475] Intake/Output from this shift: Total I/O In: 0  Out: 300 [Urine:300]  Labs:  Recent Labs  09/17/12 0518 09/18/12 0525 09/19/12 0445  WBC 13.1* 11.2* 12.1*  HGB 11.7* 11.3* 11.8*  PLT 160 184 PLATELET CLUMPS NOTED ON SMEAR, COUNT APPEARS ADEQUATE  CREATININE  --  0.67 0.66   Estimated Creatinine Clearance: 57.3 ml/min (by C-G formula based on Cr of 0.66). No results found for this basename: VANCOTROUGH, VANCOPEAK, VANCORANDOM, GENTTROUGH, GENTPEAK, GENTRANDOM, TOBRATROUGH, TOBRAPEAK, TOBRARND, AMIKACINPEAK, AMIKACINTROU, AMIKACIN,  in the last 72 hours   Microbiology: No results found for this or any previous visit (from the past 720 hour(s)).  Medical History: Past Medical History  Diagnosis Date  . Diverticulosis   . Hypertension   . Hyperlipemia     Assessment:  43 yof with h/o diverticulosis presented 2/9 with abd pain, N/V.  Cipro and Flagyl started empirically for diverticulitis.  CT suggested partial SBO with resolution of small bowel dilatation on f/u abd X-ray.  On 2/11, MD discontinued Cipro and Flagyl and ordered for Primaxin for colitis.   HPI: 71 yof with h/o diverticulosis presented 2/9 with abd pain, N/V. Cipro and Flagyl started empirically for diverticulitis. CT suggested partial SBO with resolution of small bowel dilatation on f/u abd X-ray. Today, 2/11, MD changed  abx to Primaxin given worsening nausea with Flagyl.  2/9 >> Cipro >> 2/11 2/9 >> Flagyl >> 2/11 2/11 >> Primaxin >>  Tmax: 99.3 WBCs: 12.1 Renal: Scr =0.66, CG 57, N 73  No culture data  Drug/Dose Changes: 2/13: Changed primaxin to 250mg  IV q 6 hours.   Plan:  Day #4 primaxin  Continue Primaxin 250 mg IV q6h  Pharmacy will f/u  Juliette Alcide, PharmD, BCPS.   Pager: [phone]  09/19/2012 9:19 AM

## 2012-09-19 NOTE — Progress Notes (Signed)
[facility] PROGRESS NOTE  KERSTYN CORYELL ZOX:[id number] DOB: 1940-09-21 DOA: 09/14/2012  PCP: [name], MD  Brief HPI: Alice Anthony is a 72 y.o. female with past medical history of hypertension and diverticulosis. Patient came to the hospital with abdominal pain. Patient said she was in her usual state of health till the morning of admission, when she started to have some cramping in her lower abdomen. She had nausea and she vomited more than 5 times. Patient had regular bowel movements in the morning, after that started to be mushy. She also had low-grade fever documented in the ED with temperature of 99.8. Patient has known diverticulosis, she said she had a bout of diverticulitis before which was treated at home. Initial evaluation in the emergency department with CT scan showed collection of fluids in the pelvis, thickening of the colon and questionable partial small bowel obstruction. Patient admitted to the hospital for further evaluation.  Past medical history:  Past Medical History  Diagnosis Date  . Diverticulosis   . Hypertension   . Hyperlipemia     Consultants: [facility], General Surgery  Procedures: None  Antibiotics: Primaxin 2/11--> Was on Cipro/Flagyl initially  Subjective: Patient's pain got worse this AM. Nausea but no vomiting. Feels weak.  Objective: Vital Signs  Filed Vitals:   09/18/12 0525 09/18/12 1341 09/18/12 2200 09/19/12 0600  BP: 116/71 155/72 135/73 135/69  Pulse: 91 92 91 90  Temp: 98.9 F (37.2 C) 89.6 F (32 C) 98.5 F (36.9 C) 99.3 F (37.4 C)  TempSrc: Oral Oral Oral Oral  Resp:  18 20 20   Height:      Weight:      SpO2: 96% 95% 97% 92%    Intake/Output Summary (Last 24 hours) at 09/19/12 1424 Last data filed at 09/19/12 1100  Gross per 24 hour  Intake 2197.5 ml  Output   2675 ml  Net -477.5 ml   Filed Weights   09/15/12 0500  Weight: 63.504 kg (140 lb)    Intake/Output from previous day: 02/13 0701 - 02/14  0700 In: 2557.5 [P.O.:360; I.V.:1997.5; IV Piggyback:200] Out: 3475 [Urine:3475]  General appearance: alert, cooperative, appears stated age and no distress Head: Normocephalic, without obvious abnormality, atraumatic Resp: clear to auscultation bilaterally Cardio: regular rate and rhythm, S1, S2 normal, no murmur, click, rub or gallop GI: Tender with rebound in LLQ. BS sluggish. No masses or organomegaly.  Extremities: extremities normal, atraumatic, no cyanosis or edema Pulses: 2+ and symmetric Neurologic: Alert and oriented x 3. No focal deficits.  Lab Results:  Basic Metabolic Panel:  Recent Labs Lab 09/14/12 1535 09/15/12 0510 09/18/12 0525 09/19/12 0445  NA 141 138 134* 135  K 3.3* 3.4* 3.5 4.1  CL 102 105 101 100  CO2 22 21 27 28   GLUCOSE 161* 177* 152* 154*  BUN 15 13 7  5*  CREATININE 0.77 0.81 0.67 0.66  CALCIUM 9.2 8.0* 7.8* 8.0*   Liver Function Tests:  Recent Labs Lab 09/14/12 1535 09/17/12 0518  AST 27 36  ALT 27 14  ALKPHOS 59 52  BILITOT 0.5 0.3  PROT 7.0 6.0  ALBUMIN 4.3 2.8*    Recent Labs Lab 09/14/12 1535 09/17/12 0518  LIPASE 20 11   CBC:  Recent Labs Lab 09/14/12 1535 09/15/12 0510 09/16/12 1441 09/17/12 0518 09/18/12 0525 09/19/12 0445  WBC 15.1* 16.7* 16.4* 13.1* 11.2* 12.1*  NEUTROABS 13.2*  --   --   --   --   --   HGB  14.1 12.5 12.1 11.7* 11.3* 11.8*  HCT 41.0 37.0 35.8* 34.3* 32.9* 34.1*  MCV 92.1 93.4 93.5 93.2 93.2 92.4  PLT 204 176 159 160 184 PLATELET CLUMPS NOTED ON SMEAR, COUNT APPEARS ADEQUATE   CBG:  Recent Labs Lab 09/18/12 2025 09/18/12 2351 09/19/12 0401 09/19/12 0733 09/19/12 1136  GLUCAP 148* 164* 173* 128* 178*     Studies/Results: Dg Abd Acute W/chest  09/19/2012  **ADDENDUM** CREATED: 09/19/2012 11:04:32  On the right lateral decubitus view there is linear lucency about the liver highly suspicious for free abdominal air.  Further evaluation with CT scan is recommended.  **END ADDENDUM**  SIGNED BY: Natasha Mead, M.D.   09/19/2012  *RADIOLOGY REPORT*  Clinical Data: Abdominal pain, nausea  ACUTE ABDOMEN SERIES (ABDOMEN 2 VIEW & CHEST 1 VIEW)  Comparison: 09/17/2012  Findings: Cardiomediastinal silhouette is unremarkable.  There is right basilar atelectasis or infiltrate.  No pulmonary edema.  Nonspecific nonobstructive bowel gas pattern.  No free abdominal air.  IMPRESSION: Right basilar atelectasis or infiltrate.  Nonspecific nonobstructive bowel gas pattern.  No free abdominal air.   Original Report Authenticated By: Natasha Mead, M.D.     Medications:  Scheduled: . fluticasone  1 spray Each Nare QHS  . heparin  5,000 Units Subcutaneous Q8H  . hydrOXYzine  25 mg Oral QHS  . imipenem-cilastatin  250 mg Intravenous Q6H  . insulin aspart  0-9 Units Subcutaneous Q4H  . ondansetron (ZOFRAN) IV  4 mg Intravenous Q6H   Or  . ondansetron  4 mg Oral Q6H  . pantoprazole (PROTONIX) IV  40 mg Intravenous Q24H  . polyethylene glycol  17 g Oral BID   Continuous: . dextrose 5 % and 0.45 % NaCl with KCl 20 mEq/L 75 mL/hr at 09/19/12 1200   ZOX:[id number], acetaminophen, ALPRAZolam, alum & mag hydroxide-simeth, HYDROmorphone (DILAUDID) injection, lip balm, morphine injection, ondansetron (ZOFRAN) IV  Assessment/Plan:  Active Problems:   Nausea and vomiting   Abdominal pain   Colon wall thickening   Abnormal CT scan, pelvis   Hypertension    Abdominal pain/Acute Diverticulitis  Symptoms thought to be secondary to diverticulitis. Pain worse today. AXR suggests free air. General surgery has seen and CT is pending. Continue imipenem. Initial CT scan suggested partial small bowel obstruction.  F/u abd xray reported as resolution of small bowel dilatation. She reports colonoscopy just a few months ago at [facility]. Appreciate GI and surgery input.  Abnormal Urinary Bladder on CT -Showed small focus of the posterior bladder may represent contrast collecting posteriorly versus bladder  lesion.  -This can be followed as outpatient.   Colonic wall thickening  Per CT scan report this could be secondary to the diverticulosis but neoplasm also a possibility. Patient has had colonoscopy in August of 2012 or 2013?, reported abnormal findings only was diverticulosis. Most likely due to infectious etiology resulting in inflammatory changes.    Hypertension  Continue to monitor.  DVT Prophylaxis Heparin  Code Status: full Code Family Communication: No family at bedside. Discussed with patient  Disposition Plan: Pending further improvement in clinical condition.     LOS: 5 days   [facility]  [facility] Pager [phone] 09/19/2012, 2:24 PM  If 8PM-8AM, please contact night-coverage at [email or web address], password [facility]

## 2012-09-19 NOTE — Brief Op Note (Signed)
09/14/2012 - 09/19/2012  6:30 PM  PATIENT:  Alice Anthony  71 y.o. female  PRE-OPERATIVE DIAGNOSIS:  perforated diverticulitis  POST-OPERATIVE DIAGNOSIS:  perforated diverticulitis  PROCEDURE:  Procedure(s): EXPLORATORY LAPAROTOMY, sigmoid colectomy (N/A)  SURGEON:  Surgeon(s) and Role:    * Tabia Landowski A. Donice Alperin, MD - Primary      ANESTHESIA:   general  EBL:  Total I/O In: 3127.5 [I.V.:2827.5; IV Piggyback:300] Out: 1400 [Urine:1400]  BLOOD ADMINISTERED:none  DRAINS: (19 F) Jackson-Pratt drain(s) with closed bulb suction in the pelvis   LOCAL MEDICATIONS USED:  BUPIVICAINE   SPECIMEN:  Source of Specimen:  sigmoid colon  DISPOSITION OF SPECIMEN:  PATHOLOGY  COUNTS:  YES  TOURNIQUET:  * No tourniquets in log *  DICTATION: .Other Dictation: Dictation Number [id number]  PLAN OF CARE: step down  PATIENT DISPOSITION:  PACU - hemodynamically stable.   Delay start of Pharmacological VTE agent (>24hrs) due to surgical blood loss or risk of bleeding: no

## 2012-09-19 NOTE — Consult Note (Signed)
Pt seen examined and chart/  Films reviewed.  Tender LLQ   Check CT to get better idea of diverticular disease and need for exploration.  Plain films are not clear as if there is free air to me.

## 2012-09-19 NOTE — Consult Note (Signed)
Reason for Consult: Possible free air with diverticulits Referring Physician: Dr. Vilinda Boehringer  Alice Anthony is an 72 y.o. female.  HPI: This is a 72 year old female in relatively good health. She had a colonoscopy last year which showed some diverticuli but no history of diverticulosis. No prior history of diverticulitis. She presented to the emergency room and [facility] with a four-day history of abdominal pain and cramping some looser stools a CT scan revealed some dilated small bowel with decompression of distal small bowel thickening of the rectosigmoid area without frank diverticulitis. Her WBC was elevated at 16,400. She was admitted and placed on antibiotics and failed to improve. She was seen yesterday by Dr. Debbora Dus, GI service at that point he thought she was improving. He anticipated discharge today on oral antibiotics. Early this morning she started having pain on the left side which was more acute a 2 view abdominal film was obtained. There's a question of free air. We were asked to see in consultation.   Past Medical History  Diagnosis Date  . Diverticulosis   . Hypertension   . Hyperlipemia     Past Surgical History  Procedure Laterality Date  . Tubal ligation    . Tonsillectomy      Family History  Problem Relation Age of Onset  . Diabetes Father   . Basal cell carcinoma Mother     Metastatic    Social History:  reports that she has never smoked. She has never used smokeless tobacco. She reports that she does not drink alcohol or use illicit drugs.  Allergies:  Allergies  Allergen Reactions  . Codeine   . Fosamax (Alendronate Sodium)   . Latex   . Vioxx (Rofecoxib)     Medications:  Prior to Admission:  Prescriptions prior to admission  Medication Sig Dispense Refill  . acidophilus (RISAQUAD) CAPS Take 1 capsule by mouth every evening.      Marland Kitchen ALPRAZolam (XANAX) 0.25 MG tablet Take 0.125-0.25 mg by mouth daily as needed for sleep or anxiety.       Marland Kitchen amLODipine (NORVASC) 5 MG tablet Take 5 mg by mouth every morning.      . Ascorbic Acid (VITAMIN C) 1000 MG tablet Take 1,000 mg by mouth every morning.      Marland Kitchen aspirin EC 81 MG tablet Take 81 mg by mouth every morning.      . calcium-vitamin D (OSCAL WITH D) 500-200 MG-UNIT per tablet Take 1 tablet by mouth 2 (two) times daily.      . cholecalciferol (VITAMIN D) 1000 UNITS tablet Take 2,000 Units by mouth every morning.      . hydrOXYzine (ATARAX/VISTARIL) 25 MG tablet Take 25 mg by mouth at bedtime.      . Multiple Vitamin (MULTIVITAMIN WITH MINERALS) TABS Take 1 tablet by mouth every morning.      . pyridoxine (B-6) 200 MG tablet Take 200 mg by mouth every morning.      . simvastatin (ZOCOR) 20 MG tablet Take 20 mg by mouth every evening.       Scheduled: . fluticasone  1 spray Each Nare QHS  . heparin  5,000 Units Subcutaneous Q8H  . hydrOXYzine  25 mg Oral QHS  . imipenem-cilastatin  250 mg Intravenous Q6H  . insulin aspart  0-9 Units Subcutaneous Q4H  . ondansetron (ZOFRAN) IV  4 mg Intravenous Q6H   Or  . ondansetron  4 mg Oral Q6H  . pantoprazole (PROTONIX) IV  40  mg Intravenous Q24H  . polyethylene glycol  17 g Oral BID   Continuous: . dextrose 5 % and 0.45 % NaCl with KCl 20 mEq/L 75 mL/hr at 09/19/12 1200   QIO:[id number], acetaminophen, ALPRAZolam, alum & mag hydroxide-simeth, HYDROmorphone (DILAUDID) injection, lip balm, morphine injection, ondansetron (ZOFRAN) IV Anti-infectives   Start     Dose/Rate Route Frequency Ordered Stop   09/18/12 1200  imipenem-cilastatin (PRIMAXIN) 250 mg in sodium chloride 0.9 % 100 mL IVPB     250 mg 200 mL/hr over 30 Minutes Intravenous 4 times per day 09/18/12 1111     09/16/12 1500  imipenem-cilastatin (PRIMAXIN) 500 mg in sodium chloride 0.9 % 100 mL IVPB  Status:  Discontinued     500 mg 200 mL/hr over 30 Minutes Intravenous 3 times per day 09/16/12 1449 09/18/12 1111   09/14/12 2200  ciprofloxacin (CIPRO) IVPB 400 mg   Status:  Discontinued     400 mg 200 mL/hr over 60 Minutes Intravenous 2 times daily 09/14/12 2120 09/16/12 1415   09/14/12 2200  metroNIDAZOLE (FLAGYL) IVPB 500 mg  Status:  Discontinued     500 mg 100 mL/hr over 60 Minutes Intravenous 3 times per day 09/14/12 2120 09/16/12 1415      Results for orders placed during the hospital encounter of 09/14/12 (from the past 48 hour(s))  GLUCOSE, CAPILLARY     Status: Abnormal   Collection Time    09/17/12  5:03 PM      Result Value Range   Glucose-Capillary 219 (*) 70 - 99 mg/dL   Comment 1 Notify RN    GLUCOSE, CAPILLARY     Status: Abnormal   Collection Time    09/17/12  8:16 PM      Result Value Range   Glucose-Capillary 144 (*) 70 - 99 mg/dL  GLUCOSE, CAPILLARY     Status: Abnormal   Collection Time    09/18/12 12:08 AM      Result Value Range   Glucose-Capillary 160 (*) 70 - 99 mg/dL   Comment 1 Notify RN    GLUCOSE, CAPILLARY     Status: Abnormal   Collection Time    09/18/12  3:54 AM      Result Value Range   Glucose-Capillary 136 (*) 70 - 99 mg/dL  CBC     Status: Abnormal   Collection Time    09/18/12  5:25 AM      Result Value Range   WBC 11.2 (*) 4.0 - 10.5 K/uL   RBC 3.53 (*) 3.87 - 5.11 MIL/uL   Hemoglobin 11.3 (*) 12.0 - 15.0 g/dL   HCT 01.0 (*) 27.2 - 53.6 %   MCV 93.2  78.0 - 100.0 fL   MCH 32.0  26.0 - 34.0 pg   MCHC 34.3  30.0 - 36.0 g/dL   RDW 64.4  03.4 - 74.2 %   Platelets 184  150 - 400 K/uL  BASIC METABOLIC PANEL     Status: Abnormal   Collection Time    09/18/12  5:25 AM      Result Value Range   Sodium 134 (*) 135 - 145 mEq/L   Potassium 3.5  3.5 - 5.1 mEq/L   Chloride 101  96 - 112 mEq/L   CO2 27  19 - 32 mEq/L   Glucose, Bld 152 (*) 70 - 99 mg/dL   BUN 7  6 - 23 mg/dL   Creatinine, Ser 5.95  0.50 - 1.10 mg/dL   Calcium 7.8 (*)  8.4 - 10.5 mg/dL   GFR calc non Af Amer 87 (*) >90 mL/min   GFR calc Af Amer >90  >90 mL/min   Comment:            The eGFR has been calculated     using the CKD  EPI equation.     This calculation has not been     validated in all clinical     situations.     eGFR's persistently     <90 mL/min signify     possible Chronic Kidney Disease.  GLUCOSE, CAPILLARY     Status: Abnormal   Collection Time    09/18/12  8:03 AM      Result Value Range   Glucose-Capillary 162 (*) 70 - 99 mg/dL   Comment 1 Notify RN    GLUCOSE, CAPILLARY     Status: Abnormal   Collection Time    09/18/12 12:00 PM      Result Value Range   Glucose-Capillary 146 (*) 70 - 99 mg/dL   Comment 1 Notify RN    GLUCOSE, CAPILLARY     Status: Abnormal   Collection Time    09/18/12  4:51 PM      Result Value Range   Glucose-Capillary 124 (*) 70 - 99 mg/dL  GLUCOSE, CAPILLARY     Status: Abnormal   Collection Time    09/18/12  8:25 PM      Result Value Range   Glucose-Capillary 148 (*) 70 - 99 mg/dL  GLUCOSE, CAPILLARY     Status: Abnormal   Collection Time    09/18/12 11:51 PM      Result Value Range   Glucose-Capillary 164 (*) 70 - 99 mg/dL   Comment 1 Notify RN    GLUCOSE, CAPILLARY     Status: Abnormal   Collection Time    09/19/12  4:01 AM      Result Value Range   Glucose-Capillary 173 (*) 70 - 99 mg/dL  CBC     Status: Abnormal   Collection Time    09/19/12  4:45 AM      Result Value Range   WBC 12.1 (*) 4.0 - 10.5 K/uL   Comment: WHITE COUNT CONFIRMED ON SMEAR   RBC 3.69 (*) 3.87 - 5.11 MIL/uL   Hemoglobin 11.8 (*) 12.0 - 15.0 g/dL   HCT 16.1 (*) 09.6 - 04.5 %   MCV 92.4  78.0 - 100.0 fL   MCH 32.0  26.0 - 34.0 pg   MCHC 34.6  30.0 - 36.0 g/dL   RDW 40.9  81.1 - 91.4 %   Platelets    150 - 400 K/uL   Value: PLATELET CLUMPS NOTED ON SMEAR, COUNT APPEARS ADEQUATE  BASIC METABOLIC PANEL     Status: Abnormal   Collection Time    09/19/12  4:45 AM      Result Value Range   Sodium 135  135 - 145 mEq/L   Potassium 4.1  3.5 - 5.1 mEq/L   Chloride 100  96 - 112 mEq/L   CO2 28  19 - 32 mEq/L   Glucose, Bld 154 (*) 70 - 99 mg/dL   BUN 5 (*) 6 - 23 mg/dL    Creatinine, Ser 7.82  0.50 - 1.10 mg/dL   Calcium 8.0 (*) 8.4 - 10.5 mg/dL   GFR calc non Af Amer 87 (*) >90 mL/min   GFR calc Af Amer >90  >90 mL/min   Comment:  The eGFR has been calculated     using the CKD EPI equation.     This calculation has not been     validated in all clinical     situations.     eGFR's persistently     <90 mL/min signify     possible Chronic Kidney Disease.  GLUCOSE, CAPILLARY     Status: Abnormal   Collection Time    09/19/12  7:33 AM      Result Value Range   Glucose-Capillary 128 (*) 70 - 99 mg/dL   Comment 1 Notify RN    GLUCOSE, CAPILLARY     Status: Abnormal   Collection Time    09/19/12 11:36 AM      Result Value Range   Glucose-Capillary 178 (*) 70 - 99 mg/dL   Comment 1 Notify RN      Dg Abd Acute W/chest  09/19/2012  **ADDENDUM** CREATED: 09/19/2012 11:04:32  On the right lateral decubitus view there is linear lucency about the liver highly suspicious for free abdominal air.  Further evaluation with CT scan is recommended.  **END ADDENDUM** SIGNED BY: Natasha Mead, M.D.   09/19/2012  *RADIOLOGY REPORT*  Clinical Data: Abdominal pain, nausea  ACUTE ABDOMEN SERIES (ABDOMEN 2 VIEW & CHEST 1 VIEW)  Comparison: 09/17/2012  Findings: Cardiomediastinal silhouette is unremarkable.  There is right basilar atelectasis or infiltrate.  No pulmonary edema.  Nonspecific nonobstructive bowel gas pattern.  No free abdominal air.  IMPRESSION: Right basilar atelectasis or infiltrate.  Nonspecific nonobstructive bowel gas pattern.  No free abdominal air.   Original Report Authenticated By: Natasha Mead, M.D.     Review of Systems  Constitutional: Negative.   HENT: Negative.   Eyes: Negative.   Respiratory: Negative.   Cardiovascular: Negative.   Gastrointestinal: Positive for nausea, vomiting and abdominal pain. Negative for diarrhea, constipation, blood in stool and melena.  Genitourinary: Negative.   Musculoskeletal:       Trigger fingers both hands  one with surgery, Injected right arm last month for tendonitis  Skin: Negative.        She has some areas on back recently bxed by dermatology results pending  Neurological: Negative.   Endo/Heme/Allergies: Negative.   Psychiatric/Behavioral: Negative.    Blood pressure 135/69, pulse 90, temperature 99.3 F (37.4 C), temperature source Oral, resp. rate 20, height 5\' 2"  (1.575 m), weight 140 lb (63.504 kg), SpO2 92.00%. Physical Exam  Constitutional: She is oriented to person, place, and time. She appears well-developed and well-nourished. No distress.  She is somewhat anxious at this point.  HENT:  Head: Normocephalic and atraumatic.  Nose: Nose normal.  Eyes: Conjunctivae are normal. Pupils are equal, round, and reactive to light. Right eye exhibits no discharge. Left eye exhibits no discharge. No scleral icterus.  Neck: Normal range of motion. Neck supple. No JVD present. No tracheal deviation present. No thyromegaly present.  Cardiovascular: Normal rate, regular rhythm, normal heart sounds and intact distal pulses.  Exam reveals no gallop.   No murmur heard. Respiratory: Effort normal and breath sounds normal. No stridor. No respiratory distress. She has no wheezes. She has no rales. She exhibits no tenderness.  GI: Soft. Bowel sounds are normal. She exhibits no distension and no mass. There is tenderness (tender LLQ & LUQ.  she does not have rebound tenderness on the right.  sitting up does not increase her pain.). There is guarding (some). There is no rebound.  Musculoskeletal: She exhibits no edema and no tenderness.  Lymphadenopathy:    She has no cervical adenopathy.  Neurological: She is alert and oriented to person, place, and time. No cranial nerve deficit.  Skin: Skin is warm and dry. No rash noted. She is not diaphoretic. No erythema. No pallor.  multipel bx sites on her back   Psychiatric: She has a normal mood and affect. Her behavior is normal. Judgment and thought  content normal.  Aside from anxious she was fine.    Assessment/Plan: 1. Diverticulitis with possible perforation 2. History of hypertension 3. History of 4. Recent skin biopsies for possible skin cancer. 5. Recent tendinitis treated with cortisone injection.  Plan: Dr. Luisa Hart has reviewed the films and its his opinion we should go forward with repeat CT to be sure there is no perforation. CT has been ordered and we will follow with you. Chantry Headen 09/19/2012, 12:01 PM

## 2012-09-19 NOTE — Progress Notes (Signed)
CT reviewed.  Microperforation noted but minimal contamination. Pain not any better.  Not septic.  Discussed colectomy colostomy vs non operative treatments which she has failed.  Risk,  Benefits and alternative treatments discussed,  The procedure was discussed with the patient.   partial colectomy and colostomy  discussed with the patient as well as non operative treatments. The risks of operative management include bleeding,  Infection,  Leak of anastamosis,  Ostomy formation, open procedure,  Sepsis,  Abcess,  Hernia,  DVT,  Pulmonary complications,  Cardiovascular  complications,  Injury to ureter,  Bladder,kidney,and anesthesia risks,  And death. The patient understands.  Questions answered.   The success of the procedure is 50-85 % for treating the patients symptoms. They agree to proceed. 

## 2012-09-20 DIAGNOSIS — K651 Peritoneal abscess: Secondary | ICD-10-CM | POA: Diagnosis not present

## 2012-09-20 DIAGNOSIS — K5732 Diverticulitis of large intestine without perforation or abscess without bleeding: Principal | ICD-10-CM

## 2012-09-20 DIAGNOSIS — K631 Perforation of intestine (nontraumatic): Secondary | ICD-10-CM | POA: Diagnosis not present

## 2012-09-20 DIAGNOSIS — K5792 Diverticulitis of intestine, part unspecified, without perforation or abscess without bleeding: Secondary | ICD-10-CM | POA: Diagnosis present

## 2012-09-20 LAB — GLUCOSE, CAPILLARY
Glucose-Capillary: 138 mg/dL — ABNORMAL HIGH (ref 70–99)
Glucose-Capillary: 175 mg/dL — ABNORMAL HIGH (ref 70–99)
Glucose-Capillary: 213 mg/dL — ABNORMAL HIGH (ref 70–99)

## 2012-09-20 LAB — BASIC METABOLIC PANEL
CO2: 28 mEq/L (ref 19–32)
Calcium: 7.7 mg/dL — ABNORMAL LOW (ref 8.4–10.5)
GFR calc Af Amer: >90 mL/min (ref >90)
GFR calc non Af Amer: >90 mL/min (ref >90)
Sodium: 133 mEq/L — ABNORMAL LOW (ref 135–145)

## 2012-09-20 LAB — CBC
MCH: 31.6 pg (ref 26.0–34.0)
Platelets: 231 10*3/uL (ref 150–400)
RBC: 4.02 MIL/uL (ref 3.87–5.11)

## 2012-09-20 MED ORDER — LORAZEPAM 2 MG/ML IJ SOLN
1.0000 mg | Freq: Once | INTRAMUSCULAR | Status: AC
  Administered 2012-09-20: 1 mg via INTRAVENOUS
  Filled 2012-09-20: qty 1

## 2012-09-20 NOTE — Op Note (Signed)
Alice Anthony, Alice Anthony                ACCOUNT NO.:  [id number]  MEDICAL RECORD NO.:  [id number]  LOCATION:  1223                         FACILITY:  [facility]  PHYSICIAN:  Clovis Pu. Shloma Roggenkamp, M.D.DATE OF BIRTH:  [birth year]  DATE OF PROCEDURE:  09/19/2012 DATE OF DISCHARGE:                              OPERATIVE REPORT   PREOP DIAGNOSIS:  Perforated sigmoid diverticulitis with peritonitis.  POSTOPERATIVE DIAGNOSIS:  Perforated sigmoid diverticulitis versus large pelvic abscess and peritonitis.  PROCEDURE:  Exploratory laparotomy, sigmoid colectomy with end colostomy.  SURGEON:  Maisie Fus A. Chaelyn Bunyan, M.D.  ANESTHESIA:  General endotracheal anesthesia.  ESTIMATED BLOOD LOSS:  250 mL.  DRAINS:  A 19-round Blake drain into the pelvic abscess.  SPECIMEN:  Sigmoid colon to pathology.  IV FLUIDS:  3 L of crystalloid.  INDICATIONS FOR PROCEDURE:  The patient is a 72 year old female admitted on September 14, 2012 with abdominal pain was felt to be acute diverticulitis.  Today, she had an abrupt onset of abdominal pain. Plain films obtained, which were suggestive of possible free air.  CT was then ordered to get a better idea what was going on with it.  It showed some small locules free air what looked like a microperforation. She was quite ill appearing, sick appearing, and her examination worsened as the day went on.  We recommend exploratory laparotomy with sigmoid colectomy since I thought she had failed medical management. Risk of bleeding, infection, ureteral injury, kidney injury, death, DVT, vascular injury, intra-abdominal abscess, bowel injury, and the need for other operations were discussed at great length.  She understood with the above and agreed to proceed.  DESCRIPTION OF PROCEDURE:  The patient was seen in the holding area. Questions were answered.  Informed consent was obtained.  She was taken back to the operating room and placed supine.  After induction of general  anesthesia, Foley catheter was placed in sterile conditions. She then underwent a sterile prep and drape of her abdomen.  Time-out was done.  She received 1 g of Invanz.  Midline incision was then used. Dissection carried into the abdominal cavity.  There was copious amount of turbid foul looking fluid.  I was able to place a retractor and then packed the small intestine of upper quadrants.  There was significant swelling and contamination.  I then mobilized the sigmoid colon off the lateral attachments.  Upon mobilizing, there was a large abscess just posterior to the uterus and we broke into which had probably 100 mL of frank pus.  I mobilized the entire colon up carefully.  I then divided the bowel proximal to the sigmoid colon perforation with a GIA-75 stapling device.  LigaSure was used to take the mesentery all way down to the distal sigmoid colon.  This was just at the level of the sacral promontory.  Once this was done, I then used a TA60 to divide the wall distal to the area of disease.  This was all excised and passed off the field.  The ureter was identified and preserved on the left side. Hemostasis was achieved with cautery and Surgicel.  A 2 L of irrigation were used to irrigate out the abdominal cavity until clear.  I then placed two 2-0 Prolene sutures in the rectal stump.  Seprafilm was placed over this.  The uterus and fallopian tubes were intact.  We then counted all our sponges as we removed them and they were correct.  I felt around and felt no other sponges.  At this portion of time, I chose a spot for my colostomy.  This was in the just left lateral to the umbilicus.  A circular incision was made.  An ellipse of skin was taken out.  Cautery was used to find the left rectus muscle and was opened longitudinally.  I was then able to make a hole and I passed 2 fingers through it.  The colostomy was then created by reaching over the Kistler and pulling the colon out.  It  was viable.  A single stitch was placed to hold the colostomy to the fascia.  We then closed the fascia with double-stranded #1 PDS.  Skin was left open and packed.  The umbilicus was reapproximated with 2-0 nylon.  The colostomy was secured with 3-0 Vicryl.  Appliance was applied.  Wet-to-dry dressing was applied.  Skin left open.  All final counts of sponge, needle, and instruments were found to be correct at this portion of case.  The patient was awoke, extubated, and taken to recovery in satisfactory condition.  Bulb placed to bulb suction.     Tavarius Grewe A. Jamoni Broadfoot, M.D.     TAC/MEDQ  D:  09/19/2012  T:  09/20/2012  Job:  [id number]

## 2012-09-20 NOTE — Progress Notes (Signed)
1 Day Post-Op sigmoidectomy and colostomy for perforated diverticulitis Subjective: Pt very sleepy, c/o abd pain but falls back asleep quickly  Objective: Vital signs in last 24 hours: Temp:  [98.1 F (36.7 C)-99.5 F (37.5 C)] 99.5 F (37.5 C) (02/15 0400) Pulse Rate:  [78-98] 96 (02/15 0600) Resp:  [10-21] 16 (02/15 0600) BP: (113-141)/(52-89) 124/63 mmHg (02/15 0600) SpO2:  [92 %-98 %] 95 % (02/15 0600) Weight:  [140 lb 14 oz (63.9 kg)-147 lb 14.9 oz (67.1 kg)] 147 lb 14.9 oz (67.1 kg) (02/15 0600)   Intake/Output from previous day: 02/14 0701 - 02/15 0700 In: 4628.3 [I.V.:4128.3; IV Piggyback:500] Out: 2420 [Urine:2175; Drains:195; Blood:50] Intake/Output this shift:     General appearance: cooperative, fatigued and no distress Resp: clear to auscultation bilaterally Cardio: regular rate and rhythm GI: soft, appropriately tender  Incision: packed, no significant drainage  Lab Results:   Recent Labs  09/19/12 0445 09/20/12 0346  WBC 12.1* 12.2*  HGB 11.8* 12.7  HCT 34.1* 37.2  PLT PLATELET CLUMPS NOTED ON SMEAR, COUNT APPEARS ADEQUATE 231   BMET  Recent Labs  09/19/12 0445 09/20/12 0346  NA 135 133*  K 4.1 4.1  CL 100 100  CO2 28 28  GLUCOSE 154* 190*  BUN 5* 4*  CREATININE 0.66 0.59  CALCIUM 8.0* 7.7*   PT/INR No results found for this basename: LABPROT, INR,  in the last 72 hours ABG No results found for this basename: PHART, PCO2, PO2, HCO3,  in the last 72 hours  MEDS, Scheduled . fluticasone  1 spray Each Nare QHS  . HYDROmorphone PCA 0.3 mg/mL   Intravenous Q4H  . imipenem-cilastatin  250 mg Intravenous Q6H  . insulin aspart  0-9 Units Subcutaneous Q4H  . mupirocin ointment   Nasal BID  . ondansetron (ZOFRAN) IV  4 mg Intravenous Q6H   Or  . ondansetron  4 mg Oral Q6H  . pantoprazole (PROTONIX) IV  40 mg Intravenous Q24H    Studies/Results: Ct Abdomen Pelvis W Contrast  09/19/2012  *RADIOLOGY REPORT*  Clinical Data: Left lower  quadrant pain, and leukocytosis.  CT ABDOMEN AND PELVIS WITH CONTRAST  Technique:  Multidetector CT imaging of the abdomen and pelvis was performed following the standard protocol during bolus administration of intravenous contrast.  Contrast: OMNIPAQUE IOHEXOL 300 MG/ML  SOLN and he  Comparison: CT scan 09/14/2002  Findings:  The dominant finding is acute diverticulitis with several fluid collections in the pelvis consistent with abscesses.  Several foci of gas scattered through the left omentum (image 51)  and in the ventral peritoneal space (image 49.  This consistent with perforated diverticulitis.  Suspect the lesion is within the sigmoid colon.  There is a focus of inflammation along the descending colon additionally (image 56).  The abscess in the most inferior pelvis measures 5.1 by a simple 7.1 cm.  The abscess in the left lower quadrant along the iliac vessels measures 5.3 x 3.4 cm.  There are bilateral pleural effusions.  There is bibasilar atelectasis.  No pericardial fluid.  Small amount gas along the anterior hepatic margin and in the porta hepatis.  No focal hepatic lesion.  The gallbladder, pancreas, spleen, adrenal glands are normal.  The stomach, small bowel and cecum are normal.  No evidence of small bowel obstruction.  The contrast flows into the rectum without evidence of frank extravasation.   IMPRESSION:  1.  Acute diverticulitis with abscess formation in the posterior cul-de-sac and left pelvis. 2. Bowel perforation  with small to moderate volume intraperitoneal free air. 3.  Site of perforation is likely within the sigmoid colon but there is a second focus of potential diverticulitis of the descending colon.  Findings conveyed to Will Marlyne Beards of surgery on 09/19/2012 at 1505 hours   Original Report Authenticated By: Genevive Bi, M.D.    Dg Abd Acute W/chest  09/19/2012  **ADDENDUM** CREATED: 09/19/2012 11:04:32  On the right lateral decubitus view there is linear lucency about  the liver highly suspicious for free abdominal air.  Further evaluation with CT scan is recommended.  **END ADDENDUM** SIGNED BY: Natasha Mead, M.D.   09/19/2012  *RADIOLOGY REPORT*  Clinical Data: Abdominal pain, nausea  ACUTE ABDOMEN SERIES (ABDOMEN 2 VIEW & CHEST 1 VIEW)  Comparison: 09/17/2012  Findings: Cardiomediastinal silhouette is unremarkable.  There is right basilar atelectasis or infiltrate.  No pulmonary edema.  Nonspecific nonobstructive bowel gas pattern.  No free abdominal air.  IMPRESSION: Right basilar atelectasis or infiltrate.  Nonspecific nonobstructive bowel gas pattern.  No free abdominal air.   Original Report Authenticated By: Natasha Mead, M.D.     Assessment: s/p Procedure(s): EXPLORATORY LAPAROTOMY, sigmoid colectomy Patient Active Problem List  Diagnosis  . Nausea and vomiting  . Abdominal pain  . Colon wall thickening  . Abnormal CT scan, pelvis  . Hypertension  . Acute diverticulitis  . Perforated bowel  . Intra-abdominal abscess     Plan: Cont NG and NPO PCA for pain control  Incentive spirometry    LOS: 6 days     .Vanita Panda, MD [facility], [place] [phone]   09/20/2012 7:49 AM

## 2012-09-20 NOTE — Progress Notes (Signed)
[facility] PROGRESS NOTE  KHYA HALLS UJW:[id number] DOB: 1940/11/17 DOA: 09/14/2012  PCP: [name], MD  Brief HPI: Alice Anthony is a 72 y.o. female with past medical history of hypertension and diverticulosis. Patient came to the hospital with abdominal pain. Patient said she was in her usual state of health till the morning of admission, when she started to have some cramping in her lower abdomen. She had nausea and she vomited more than 5 times. Patient had regular bowel movements in the morning, after that started to be mushy. She also had low-grade fever documented in the ED with temperature of 99.8. Patient has known diverticulosis, she said she had a bout of diverticulitis before which was treated at home. Initial evaluation in the emergency department with CT scan showed collection of fluids in the pelvis, thickening of the colon and questionable partial small bowel obstruction. Patient admitted to the hospital for further evaluation.  Past medical history:  Past Medical History  Diagnosis Date  . Diverticulosis   . Hypertension   . Hyperlipemia     Consultants: [facility], General Surgery  Procedures: Exploratory laparotomy, sigmoid colectomy and end colostomy on 09/19/12  Antibiotics: Primaxin 2/11--> Was on Cipro/Flagyl initially  Subjective: Patient complains of abdominal pain. She is sleepy.   Objective: Vital Signs  Filed Vitals:   09/20/12 0155 09/20/12 0200 09/20/12 0400 09/20/12 0600  BP: 120/60 124/52 124/59 124/63  Pulse: 91 89 93 96  Temp:   99.5 F (37.5 C)   TempSrc:   Axillary   Resp: 17 16 17 16   Height:      Weight:    67.1 kg (147 lb 14.9 oz)  SpO2: 97% 97% 98% 95%    Intake/Output Summary (Last 24 hours) at 09/20/12 0717 Last data filed at 09/20/12 0600  Gross per 24 hour  Intake 4628.33 ml  Output   2420 ml  Net 2208.33 ml   Filed Weights   09/15/12 0500 09/19/12 2051 09/20/12 0600  Weight: 63.504 kg (140 lb) 63.9 kg (140  lb 14 oz) 67.1 kg (147 lb 14.9 oz)    Intake/Output from previous day: 02/14 0701 - 02/15 0700 In: 4628.3 [I.V.:4128.3; IV Piggyback:500] Out: 2420 [Urine:2175; Drains:195; Blood:50]  General appearance: Somnolent but arousable, cooperative, appears stated age and no distress Resp: clear to auscultation bilaterally with decreased air entry at the bases Cardio: regular rate and rhythm, S1, S2 normal, no murmur, click, rub or gallop GI: Covered with dressing. Ostomy noted. No bowel sounds. Tender to palpation. No masses or organomegaly.  Extremities: extremities normal, atraumatic, no cyanosis or edema Pulses: 2+ and symmetric Neurologic: Somnolent but arousable. Reoriented to place. Did know the date. No focal deficits.  Lab Results:  Basic Metabolic Panel:  Recent Labs Lab 09/14/12 1535 09/15/12 0510 09/18/12 0525 09/19/12 0445 09/20/12 0346  NA 141 138 134* 135 133*  K 3.3* 3.4* 3.5 4.1 4.1  CL 102 105 101 100 100  CO2 22 21 27 28 28   GLUCOSE 161* 177* 152* 154* 190*  BUN 15 13 7  5* 4*  CREATININE 0.77 0.81 0.67 0.66 0.59  CALCIUM 9.2 8.0* 7.8* 8.0* 7.7*   Liver Function Tests:  Recent Labs Lab 09/14/12 1535 09/17/12 0518  AST 27 36  ALT 27 14  ALKPHOS 59 52  BILITOT 0.5 0.3  PROT 7.0 6.0  ALBUMIN 4.3 2.8*    Recent Labs Lab 09/14/12 1535 09/17/12 0518  LIPASE 20 11   CBC:  Recent Labs Lab  09/14/12 1535  09/16/12 1441 09/17/12 0518 09/18/12 0525 09/19/12 0445 09/20/12 0346  WBC 15.1*  < > 16.4* 13.1* 11.2* 12.1* 12.2*  NEUTROABS 13.2*  --   --   --   --   --   --   HGB 14.1  < > 12.1 11.7* 11.3* 11.8* 12.7  HCT 41.0  < > 35.8* 34.3* 32.9* 34.1* 37.2  MCV 92.1  < > 93.5 93.2 93.2 92.4 92.5  PLT 204  < > 159 160 184 PLATELET CLUMPS NOTED ON SMEAR, COUNT APPEARS ADEQUATE 231  < > = values in this interval not displayed. CBG:  Recent Labs Lab 09/19/12 1534 09/19/12 2012 09/19/12 2134 09/20/12 0014 09/20/12 0356  GLUCAP 161* 155* 165* 213*  175*     Studies/Results: Ct Abdomen Pelvis W Contrast  09/19/2012  *RADIOLOGY REPORT*  Clinical Data: Left lower quadrant pain, and leukocytosis.  CT ABDOMEN AND PELVIS WITH CONTRAST  Technique:  Multidetector CT imaging of the abdomen and pelvis was performed following the standard protocol during bolus administration of intravenous contrast.  Contrast: OMNIPAQUE IOHEXOL 300 MG/ML  SOLN and he  Comparison: CT scan 09/14/2002  Findings:  The dominant finding is acute diverticulitis with several fluid collections in the pelvis consistent with abscesses.  Several foci of gas scattered through the left omentum (image 51)  and in the ventral peritoneal space (image 49.  This consistent with perforated diverticulitis.  Suspect the lesion is within the sigmoid colon.  There is a focus of inflammation along the descending colon additionally (image 56).  The abscess in the most inferior pelvis measures 5.1 by a simple 7.1 cm.  The abscess in the left lower quadrant along the iliac vessels measures 5.3 x 3.4 cm.  There are bilateral pleural effusions.  There is bibasilar atelectasis.  No pericardial fluid.  Small amount gas along the anterior hepatic margin and in the porta hepatis.  No focal hepatic lesion.  The gallbladder, pancreas, spleen, adrenal glands are normal.  The stomach, small bowel and cecum are normal.  No evidence of small bowel obstruction.  The contrast flows into the rectum without evidence of frank extravasation.   IMPRESSION:  1.  Acute diverticulitis with abscess formation in the posterior cul-de-sac and left pelvis. 2. Bowel perforation with small to moderate volume intraperitoneal free air. 3.  Site of perforation is likely within the sigmoid colon but there is a second focus of potential diverticulitis of the descending colon.  Findings conveyed to Will Marlyne Beards of surgery on 09/19/2012 at 1505 hours   Original Report Authenticated By: Genevive Bi, M.D.    Dg Abd Acute  W/chest  09/19/2012  **ADDENDUM** CREATED: 09/19/2012 11:04:32  On the right lateral decubitus view there is linear lucency about the liver highly suspicious for free abdominal air.  Further evaluation with CT scan is recommended.  **END ADDENDUM** SIGNED BY: Natasha Mead, M.D.   09/19/2012  *RADIOLOGY REPORT*  Clinical Data: Abdominal pain, nausea  ACUTE ABDOMEN SERIES (ABDOMEN 2 VIEW & CHEST 1 VIEW)  Comparison: 09/17/2012  Findings: Cardiomediastinal silhouette is unremarkable.  There is right basilar atelectasis or infiltrate.  No pulmonary edema.  Nonspecific nonobstructive bowel gas pattern.  No free abdominal air.  IMPRESSION: Right basilar atelectasis or infiltrate.  Nonspecific nonobstructive bowel gas pattern.  No free abdominal air.   Original Report Authenticated By: Natasha Mead, M.D.     Medications:  Scheduled: . fluticasone  1 spray Each Nare QHS  . HYDROmorphone PCA 0.3  mg/mL   Intravenous Q4H  . hydrOXYzine  25 mg Oral QHS  . imipenem-cilastatin  250 mg Intravenous Q6H  . insulin aspart  0-9 Units Subcutaneous Q4H  . mupirocin ointment   Nasal BID  . ondansetron (ZOFRAN) IV  4 mg Intravenous Q6H   Or  . ondansetron  4 mg Oral Q6H  . pantoprazole (PROTONIX) IV  40 mg Intravenous Q24H  . polyethylene glycol  17 g Oral BID   Continuous: . dextrose 5 % and 0.45 % NaCl with KCl 20 mEq/L 125 mL/hr at 09/20/12 0509   ZOX:[id number], acetaminophen, ALPRAZolam, alum & mag hydroxide-simeth, diphenhydrAMINE, diphenhydrAMINE, lip balm, morphine injection, naloxone, ondansetron (ZOFRAN) IV, ondansetron (ZOFRAN) IV, phenol, sodium chloride  Assessment/Plan:  Active Problems:   Nausea and vomiting   Abdominal pain   Colon wall thickening   Abnormal CT scan, pelvis   Hypertension    Acute Diverticulitis with perforation and Abscess formation Status post sigmoid colectomy and end colostomy. Patient appears to be medically stable. On Imipenem. Management per surgery at this point. GI  is following as well.  Abnormal Urinary Bladder on CT Showed small focus of the posterior bladder may represent contrast collecting posteriorly versus bladder lesion. This can be followed as outpatient.   Colonic wall thickening  Per CT scan report this could be secondary to the diverticulosis but neoplasm also a possibility. Patient has had colonoscopy in August of 2012 or 2013?, reported abnormal findings only was diverticulosis. Most likely due to infectious etiology resulting in inflammatory changes.    Hypertension  Continue to monitor. BP is stable. She was on Amlodipine at home which is being held.  DVT Prophylaxis Heparin  Code Status: Full Code Family Communication: Discussed with patient.  Disposition Plan: Unclear for now.     LOS: 6 days   [facility]  [facility] Pager [phone] 09/20/2012, 7:17 AM  If 8PM-8AM, please contact night-coverage at [email or web address], password [facility]

## 2012-09-21 DIAGNOSIS — E875 Hyperkalemia: Secondary | ICD-10-CM

## 2012-09-21 LAB — BASIC METABOLIC PANEL
BUN: 4 mg/dL — ABNORMAL LOW (ref 6–23)
BUN: 4 mg/dL — ABNORMAL LOW (ref 6–23)
Calcium: 8 mg/dL — ABNORMAL LOW (ref 8.4–10.5)
Chloride: 97 mEq/L (ref 96–112)
Creatinine, Ser: 0.62 mg/dL (ref 0.50–1.10)
Creatinine, Ser: 0.51 mg/dL (ref 0.50–1.10)
GFR calc Af Amer: >90 mL/min (ref >90)
GFR calc non Af Amer: >90 mL/min (ref >90)
Glucose, Bld: 160 mg/dL — ABNORMAL HIGH (ref 70–99)
Potassium: 5.3 mEq/L — ABNORMAL HIGH (ref 3.5–5.1)

## 2012-09-21 LAB — GLUCOSE, CAPILLARY
Glucose-Capillary: 156 mg/dL — ABNORMAL HIGH (ref 70–99)
Glucose-Capillary: 153 mg/dL — ABNORMAL HIGH (ref 70–99)
Glucose-Capillary: 170 mg/dL — ABNORMAL HIGH (ref 70–99)

## 2012-09-21 LAB — CBC
HCT: 34.5 % — ABNORMAL LOW (ref 36.0–46.0)
Hemoglobin: 11.7 g/dL — ABNORMAL LOW (ref 12.0–15.0)
MCHC: 33.9 g/dL (ref 30.0–36.0)
MCV: 93 fL (ref 78.0–100.0)
WBC: 18.1 10*3/uL — ABNORMAL HIGH (ref 4.0–10.5)

## 2012-09-21 MED ORDER — DEXTROSE-NACL 5-0.9 % IV SOLN
INTRAVENOUS | Status: DC
  Administered 2012-09-21: 18:00:00 via INTRAVENOUS
  Administered 2012-09-21: 100 mL/h via INTRAVENOUS
  Administered 2012-09-22: 05:00:00 via INTRAVENOUS

## 2012-09-21 NOTE — Progress Notes (Signed)
Handoff report called to [place], Charity fundraiser.  Transported patient via icu bed to room 1533. Patient alert and oriented.

## 2012-09-21 NOTE — Evaluation (Signed)
Physical Therapy Evaluation Patient Details Name: Alice Anthony MRN: [id number] DOB: 1941/04/06 Today's Date: 09/21/2012 Time: 1308-6578 PT Time Calculation (min): 31 min  PT Assessment / Plan / Recommendation Clinical Impression  Pt presents s/p exploratory laparotomy with colectomy and colostomy placement.  Tolerated OOB and ambulation in hallway, however with increased c/o pain.  She also states that she feels "gas pains", however unable to relieve self during session.  Pt on PCA pump and encouraged to use as needed.  Pt will benefit from skilled PT in acute venue to address deficits.  PT recommends HHPT for follow up at D/C to maximize pts functional mobility.     PT Assessment  Patient needs continued PT services    Follow Up Recommendations  Home health PT    Does the patient have the potential to tolerate intense rehabilitation      Barriers to Discharge None      Equipment Recommendations  None recommended by PT    Recommendations for Other Services     Frequency Min 3X/week    Precautions / Restrictions Precautions Precautions: Fall Precaution Comments: colostomy and JP drain Restrictions Weight Bearing Restrictions: No   Pertinent Vitals/Pain Increased c/o abdominal pain during session.  PCA encouraged.       Mobility  Bed Mobility Bed Mobility: Rolling Left;Left Sidelying to Sit Rolling Left: 4: Min assist Left Sidelying to Sit: 4: Min assist;3: Mod assist;With rails Details for Bed Mobility Assistance: Assist for trunk into sitting position with mod cues for hand placement on bed rail to self assist once in SL position.  Transfers Transfers: Sit to Stand;Stand to Sit Sit to Stand: 4: Min guard;From elevated surface;With upper extremity assist;From bed;From toilet Stand to Sit: 4: Min guard;With upper extremity assist;With armrests;To chair/3-in-1;To toilet Details for Transfer Assistance: Performed x 2 in order to use restroom with cues for hand placement  and safety.   Ambulation/Gait Ambulation/Gait Assistance: 4: Min guard Ambulation Distance (Feet): 100 Feet Assistive device: Rolling walker Ambulation/Gait Assistance Details: Cues for maintaining position inside of RW and upright posture throughout.   Gait Pattern: Step-through pattern;Decreased stride length Gait velocity: decreased Stairs: No Wheelchair Mobility Wheelchair Mobility: No    Exercises     PT Diagnosis: Generalized weakness;Acute pain  PT Problem List: Decreased strength;Decreased activity tolerance;Decreased balance;Decreased mobility;Decreased knowledge of use of DME;Pain PT Treatment Interventions: DME instruction;Gait training;Functional mobility training;Stair training;Therapeutic activities;Therapeutic exercise;Balance training;Patient/family education   PT Goals Acute Rehab PT Goals PT Goal Formulation: With patient Time For Goal Achievement: 09/28/12 Potential to Achieve Goals: Good Pt will go Supine/Side to Sit: with supervision PT Goal: Supine/Side to Sit - Progress: Goal set today Pt will go Sit to Supine/Side: with supervision PT Goal: Sit to Supine/Side - Progress: Goal set today Pt will go Sit to Stand: with modified independence PT Goal: Sit to Stand - Progress: Goal set today Pt will Ambulate: >150 feet;with modified independence;with least restrictive assistive device PT Goal: Ambulate - Progress: Goal set today Pt will Go Up / Down Stairs: 1-2 stairs;with supervision;with least restrictive assistive device PT Goal: Up/Down Stairs - Progress: Goal set today  Visit Information  Last PT Received On: 09/21/12 Assistance Needed: +1    Subjective Data  Subjective: I've been better. Patient Stated Goal: to return home.    Prior Functioning  Home Living Lives With: Spouse Available Help at Discharge: Family;Available 24 hours/day Type of Home: House Home Access: Stairs to enter [employer] of Steps: 1 small step Home Layout: One  level Bathroom Shower/Tub: Tub/shower unit;Walk-in shower Bathroom Toilet: Standard Home Adaptive Equipment: Grab bars in shower;Walker - rolling Prior Function Level of Independence: Independent Able to Take Stairs?: Yes Driving: Yes Communication Communication: No difficulties    Cognition  Cognition Overall Cognitive Status: Appears within functional limits for tasks assessed/performed Arousal/Alertness: Awake/alert Orientation Level: Appears intact for tasks assessed Behavior During Session: [facility] for tasks performed    Extremity/Trunk Assessment Right Lower Extremity Assessment RLE ROM/Strength/Tone: WFL for tasks assessed RLE Sensation: WFL - Light Touch Left Lower Extremity Assessment LLE ROM/Strength/Tone: WFL for tasks assessed LLE Sensation: WFL - Light Touch Trunk Assessment Trunk Assessment: Normal   Balance    End of Session PT - End of Session Activity Tolerance: Patient limited by pain Patient left: in chair;with call bell/phone within reach;with nursing in room Nurse Communication: Mobility status  GP     Vista Deck 09/21/2012, 5:06 PM

## 2012-09-21 NOTE — Progress Notes (Signed)
2 Days Post-Op sigmoidectomy and colostomy for perforated diverticulitis  Subjective: Pt more alert today, answers questions appropriately, UOP good, no fevers, PCA controlling pain, complains that she didn't sleep much last night  Objective: Vital signs in last 24 hours: Temp:  [97.8 F (36.6 C)-99.1 F (37.3 C)] 98.9 F (37.2 C) (02/16 0400) Pulse Rate:  [96-102] 96 (02/16 0345) Resp:  [12-24] 18 (02/16 0346) BP: (125-144)/(54-66) 138/54 mmHg (02/16 0345) SpO2:  [95 %-99 %] 97 % (02/16 0346)   Intake/Output from previous day: 02/15 0701 - 02/16 0700 In: 3285 [I.V.:2885; IV Piggyback:400] Out: 2595 [Urine:2300; Emesis/NG output:200; Drains:85; Stool:10] Intake/Output this shift:   General appearance: cooperative, fatigued and no distress Resp: clear to auscultation bilaterally Cardio: regular rate and rhythm GI: soft, appropriately tender Ostomy: beefy red, no output  Incision: packed, dressing changed, looks fine  Lab Results:   Recent Labs  09/20/12 0346 09/21/12 0337  WBC 12.2* 18.1*  HGB 12.7 11.7*  HCT 37.2 34.5*  PLT 231 263   BMET  Recent Labs  09/20/12 0346 09/21/12 0337  NA 133* 132*  K 4.1 5.3*  CL 100 97  CO2 28 31  GLUCOSE 190* 160*  BUN 4* 4*  CREATININE 0.59 0.62  CALCIUM 7.7* 8.1*   PT/INR No results found for this basename: LABPROT, INR,  in the last 72 hours ABG No results found for this basename: PHART, PCO2, PO2, HCO3,  in the last 72 hours  MEDS, Scheduled . fluticasone  1 spray Each Nare QHS  . HYDROmorphone PCA 0.3 mg/mL   Intravenous Q4H  . imipenem-cilastatin  250 mg Intravenous Q6H  . insulin aspart  0-9 Units Subcutaneous Q4H  . mupirocin ointment   Nasal BID  . ondansetron (ZOFRAN) IV  4 mg Intravenous Q6H   Or  . ondansetron  4 mg Oral Q6H  . pantoprazole (PROTONIX) IV  40 mg Intravenous Q24H    Studies/Results: Ct Abdomen Pelvis W Contrast  09/19/2012  *RADIOLOGY REPORT*  Clinical Data: Left lower quadrant pain,  and leukocytosis.  CT ABDOMEN AND PELVIS WITH CONTRAST  Technique:  Multidetector CT imaging of the abdomen and pelvis was performed following the standard protocol during bolus administration of intravenous contrast.  Contrast: OMNIPAQUE IOHEXOL 300 MG/ML  SOLN and he  Comparison: CT scan 09/14/2002  Findings:  The dominant finding is acute diverticulitis with several fluid collections in the pelvis consistent with abscesses.  Several foci of gas scattered through the left omentum (image 51)  and in the ventral peritoneal space (image 49.  This consistent with perforated diverticulitis.  Suspect the lesion is within the sigmoid colon.  There is a focus of inflammation along the descending colon additionally (image 56).  The abscess in the most inferior pelvis measures 5.1 by a simple 7.1 cm.  The abscess in the left lower quadrant along the iliac vessels measures 5.3 x 3.4 cm.  There are bilateral pleural effusions.  There is bibasilar atelectasis.  No pericardial fluid.  Small amount gas along the anterior hepatic margin and in the porta hepatis.  No focal hepatic lesion.  The gallbladder, pancreas, spleen, adrenal glands are normal.  The stomach, small bowel and cecum are normal.  No evidence of small bowel obstruction.  The contrast flows into the rectum without evidence of frank extravasation.   IMPRESSION:  1.  Acute diverticulitis with abscess formation in the posterior cul-de-sac and left pelvis. 2. Bowel perforation with small to moderate volume intraperitoneal free air. 3.  Site of perforation is likely within the sigmoid colon but there is a second focus of potential diverticulitis of the descending colon.  Findings conveyed to Will Marlyne Beards of surgery on 09/19/2012 at 1505 hours   Original Report Authenticated By: Genevive Bi, M.D.    Dg Abd Acute W/chest  09/19/2012  **ADDENDUM** CREATED: 09/19/2012 11:04:32  On the right lateral decubitus view there is linear lucency about the liver highly  suspicious for free abdominal air.  Further evaluation with CT scan is recommended.  **END ADDENDUM** SIGNED BY: Natasha Mead, M.D.   09/19/2012  *RADIOLOGY REPORT*  Clinical Data: Abdominal pain, nausea  ACUTE ABDOMEN SERIES (ABDOMEN 2 VIEW & CHEST 1 VIEW)  Comparison: 09/17/2012  Findings: Cardiomediastinal silhouette is unremarkable.  There is right basilar atelectasis or infiltrate.  No pulmonary edema.  Nonspecific nonobstructive bowel gas pattern.  No free abdominal air.  IMPRESSION: Right basilar atelectasis or infiltrate.  Nonspecific nonobstructive bowel gas pattern.  No free abdominal air.   Original Report Authenticated By: Natasha Mead, M.D.     Assessment: s/p Procedure(s): EXPLORATORY LAPAROTOMY, sigmoid colectomy Patient Active Problem List  Diagnosis  . Nausea and vomiting  . Abdominal pain  . Colon wall thickening  . Abnormal CT scan, pelvis  . Hypertension  . Acute diverticulitis  . Perforated bowel  . Intra-abdominal abscess  . Hyperkalemia     Plan: D/C NG, Cont NPO PCA for pain control  D/C foley Elevated WBC- cont antibiotics due to abd sepsis, could be delayed response from surgery, will follow.  If continues to elevate, will investigate further Cont Incentive spirometry OOB today Transfer to floor PT/OT and Wound consults today   LOS: 7 days     .Vanita Panda, MD [facility], [place] [phone]   09/21/2012 7:54 AM

## 2012-09-21 NOTE — Progress Notes (Signed)
[facility] PROGRESS NOTE  Alice Anthony ZOX:[id number] DOB: [birth year] DOA: 09/14/2012  PCP: [name], MD  Brief HPI: Alice Anthony is a 72 y.o. female with past medical history of hypertension and diverticulosis. Patient came to the hospital with abdominal pain. Patient said she was in her usual state of health till the morning of admission, when she started to have some cramping in her lower abdomen. She had nausea and she vomited more than 5 times. Patient had regular bowel movements in the morning, after that started to be mushy. She also had low-grade fever documented in the ED with temperature of 99.8. Patient has known diverticulosis, she said she had a bout of diverticulitis before which was treated at home. Initial evaluation in the emergency department with CT scan showed collection of fluids in the pelvis, thickening of the colon and questionable partial small bowel obstruction. Patient admitted to the hospital for further evaluation.  Past medical history:  Past Medical History  Diagnosis Date  . Diverticulosis   . Hypertension   . Hyperlipemia     Consultants: [facility], General Surgery  Procedures: Exploratory laparotomy, sigmoid colectomy and end colostomy on 09/19/12  Antibiotics: Primaxin 2/11--> Was on Cipro/Flagyl initially  Subjective: Patient feels slightly better. Pain is 3-4/10. No nausea.   Objective: Vital Signs  Filed Vitals:   09/21/12 0022 09/21/12 0345 09/21/12 0346 09/21/12 0400  BP:  138/54    Pulse:  96    Temp:    98.9 F (37.2 C)  TempSrc:    Oral  Resp: 16 19 18    Height:      Weight:      SpO2: 96% 97% 97%     Intake/Output Summary (Last 24 hours) at 09/21/12 0737 Last data filed at 09/21/12 0700  Gross per 24 hour  Intake   3285 ml  Output   2595 ml  Net    690 ml   Filed Weights   09/15/12 0500 09/19/12 2051 09/20/12 0600  Weight: 63.504 kg (140 lb) 63.9 kg (140 lb 14 oz) 67.1 kg (147 lb 14.9 oz)    General  appearance: Alert, awake. Cooperative, appears stated age and no distress Resp: clear to auscultation bilaterally with decreased air entry at the bases Cardio: regular rate and rhythm, S1, S2 normal, no murmur, click, rub or gallop GI: Covered with dressing. Ostomy noted. Very few bowel sounds. Tender to palpation without rebound. No masses or organomegaly.  Extremities: extremities normal, atraumatic, no cyanosis or edema Pulses: 2+ and symmetric Neurologic: Alert and oriented. No focal deficits.  Lab Results:  Basic Metabolic Panel:  Recent Labs Lab 09/15/12 0510 09/18/12 0525 09/19/12 0445 09/20/12 0346 09/21/12 0337  NA 138 134* 135 133* 132*  K 3.4* 3.5 4.1 4.1 5.3*  CL 105 101 100 100 97  CO2 21 27 28 28 31   GLUCOSE 177* 152* 154* 190* 160*  BUN 13 7 5* 4* 4*  CREATININE 0.81 0.67 0.66 0.59 0.62  CALCIUM 8.0* 7.8* 8.0* 7.7* 8.1*   Liver Function Tests:  Recent Labs Lab 09/14/12 1535 09/17/12 0518  AST 27 36  ALT 27 14  ALKPHOS 59 52  BILITOT 0.5 0.3  PROT 7.0 6.0  ALBUMIN 4.3 2.8*    Recent Labs Lab 09/14/12 1535 09/17/12 0518  LIPASE 20 11   CBC:  Recent Labs Lab 09/14/12 1535  09/17/12 0518 09/18/12 0525 09/19/12 0445 09/20/12 0346 09/21/12 0337  WBC 15.1*  < > 13.1* 11.2* 12.1* 12.2* 18.1*  NEUTROABS 13.2*  --   --   --   --   --   --   HGB 14.1  < > 11.7* 11.3* 11.8* 12.7 11.7*  HCT 41.0  < > 34.3* 32.9* 34.1* 37.2 34.5*  MCV 92.1  < > 93.2 93.2 92.4 92.5 93.0  PLT 204  < > 160 184 PLATELET CLUMPS NOTED ON SMEAR, COUNT APPEARS ADEQUATE 231 263  < > = values in this interval not displayed. CBG:  Recent Labs Lab 09/20/12 1138 09/20/12 1616 09/20/12 1947 09/21/12 0006 09/21/12 0337  GLUCAP 171* 150* 138* 156* 153*     Studies/Results: Ct Abdomen Pelvis W Contrast  09/19/2012  *RADIOLOGY REPORT*  Clinical Data: Left lower quadrant pain, and leukocytosis.  CT ABDOMEN AND PELVIS WITH CONTRAST  Technique:  Multidetector CT imaging of  the abdomen and pelvis was performed following the standard protocol during bolus administration of intravenous contrast.  Contrast: OMNIPAQUE IOHEXOL 300 MG/ML  SOLN and he  Comparison: CT scan 09/14/2002  Findings:  The dominant finding is acute diverticulitis with several fluid collections in the pelvis consistent with abscesses.  Several foci of gas scattered through the left omentum (image 51)  and in the ventral peritoneal space (image 49.  This consistent with perforated diverticulitis.  Suspect the lesion is within the sigmoid colon.  There is a focus of inflammation along the descending colon additionally (image 56).  The abscess in the most inferior pelvis measures 5.1 by a simple 7.1 cm.  The abscess in the left lower quadrant along the iliac vessels measures 5.3 x 3.4 cm.  There are bilateral pleural effusions.  There is bibasilar atelectasis.  No pericardial fluid.  Small amount gas along the anterior hepatic margin and in the porta hepatis.  No focal hepatic lesion.  The gallbladder, pancreas, spleen, adrenal glands are normal.  The stomach, small bowel and cecum are normal.  No evidence of small bowel obstruction.  The contrast flows into the rectum without evidence of frank extravasation.   IMPRESSION:  1.  Acute diverticulitis with abscess formation in the posterior cul-de-sac and left pelvis. 2. Bowel perforation with small to moderate volume intraperitoneal free air. 3.  Site of perforation is likely within the sigmoid colon but there is a second focus of potential diverticulitis of the descending colon.  Findings conveyed to Will Marlyne Beards of surgery on 09/19/2012 at 1505 hours   Original Report Authenticated By: Genevive Bi, M.D.    Dg Abd Acute W/chest  09/19/2012  **ADDENDUM** CREATED: 09/19/2012 11:04:32  On the right lateral decubitus view there is linear lucency about the liver highly suspicious for free abdominal air.  Further evaluation with CT scan is recommended.  **END  ADDENDUM** SIGNED BY: Natasha Mead, M.D.   09/19/2012  *RADIOLOGY REPORT*  Clinical Data: Abdominal pain, nausea  ACUTE ABDOMEN SERIES (ABDOMEN 2 VIEW & CHEST 1 VIEW)  Comparison: 09/17/2012  Findings: Cardiomediastinal silhouette is unremarkable.  There is right basilar atelectasis or infiltrate.  No pulmonary edema.  Nonspecific nonobstructive bowel gas pattern.  No free abdominal air.  IMPRESSION: Right basilar atelectasis or infiltrate.  Nonspecific nonobstructive bowel gas pattern.  No free abdominal air.   Original Report Authenticated By: Natasha Mead, M.D.     Medications:  Scheduled: . fluticasone  1 spray Each Nare QHS  . HYDROmorphone PCA 0.3 mg/mL   Intravenous Q4H  . imipenem-cilastatin  250 mg Intravenous Q6H  . insulin aspart  0-9 Units Subcutaneous Q4H  . mupirocin  ointment   Nasal BID  . ondansetron (ZOFRAN) IV  4 mg Intravenous Q6H   Or  . ondansetron  4 mg Oral Q6H  . pantoprazole (PROTONIX) IV  40 mg Intravenous Q24H   Continuous: . dextrose 5 % and 0.9% NaCl     WUJ:[id number], acetaminophen, ALPRAZolam, alum & mag hydroxide-simeth, diphenhydrAMINE, diphenhydrAMINE, lip balm, naloxone, ondansetron (ZOFRAN) IV, ondansetron (ZOFRAN) IV, phenol, sodium chloride  Assessment/Plan:  Active Problems:   Nausea and vomiting   Abdominal pain   Colon wall thickening   Abnormal CT scan, pelvis   Hypertension   Acute diverticulitis   Perforated bowel   Intra-abdominal abscess   Hyperkalemia    Acute Diverticulitis with perforation and Abscess formation Status post sigmoid colectomy and end colostomy. Patient appears to be medically stable. On Imipenem. Management per surgery at this point.   Abnormal Urinary Bladder on CT Showed small focus of the posterior bladder may represent contrast collecting posteriorly versus bladder lesion. This can be followed as outpatient.   Colonic wall thickening  Per CT scan report this could be secondary to the diverticulosis but  neoplasm also a possibility. Patient has had colonoscopy in August of 2012 or 2013?, reported abnormal findings only was diverticulosis. Most likely due to infectious etiology resulting in inflammatory changes.    History of Hypertension  Continue to monitor. BP is stable. She was on Amlodipine at home which is being held.  Hyperkalemia Remove K from IVF. Repeat bmet later today. Will give definitive treatment if still high at that point.  DVT Prophylaxis Heparin  Code Status: Full Code Family Communication: Discussed with patient.  Disposition Plan: Further management per general surgery. Discussed with Dr. Maisie Fus. She will take over care at this point. We will follow today and be available as needed from tomorrow.    LOS: 7 days   [facility]  [facility] Pager [phone] 09/21/2012, 7:37 AM  If 8PM-8AM, please contact night-coverage at [email or web address], password [facility]

## 2012-09-22 ENCOUNTER — Encounter (HOSPITAL_COMMUNITY): Payer: Self-pay | Admitting: Surgery

## 2012-09-22 LAB — BASIC METABOLIC PANEL
BUN: 4 mg/dL — ABNORMAL LOW (ref 6–23)
CO2: 29 mEq/L (ref 19–32)
Chloride: 102 mEq/L (ref 96–112)
GFR calc Af Amer: >90 mL/min (ref >90)
Glucose, Bld: 160 mg/dL — ABNORMAL HIGH (ref 70–99)
Potassium: 3.7 mEq/L (ref 3.5–5.1)

## 2012-09-22 LAB — GLUCOSE, CAPILLARY
Glucose-Capillary: 155 mg/dL — ABNORMAL HIGH (ref 70–99)
Glucose-Capillary: 138 mg/dL — ABNORMAL HIGH (ref 70–99)
Glucose-Capillary: 104 mg/dL — ABNORMAL HIGH (ref 70–99)
Glucose-Capillary: 118 mg/dL — ABNORMAL HIGH (ref 70–99)

## 2012-09-22 LAB — CBC
HCT: 35.1 % — ABNORMAL LOW (ref 36.0–46.0)
Hemoglobin: 11.7 g/dL — ABNORMAL LOW (ref 12.0–15.0)
MCHC: 33.3 g/dL (ref 30.0–36.0)
MCV: 93.6 fL (ref 78.0–100.0)

## 2012-09-22 LAB — HEMOGLOBIN A1C
Hgb A1c MFr Bld: 6.5 % — ABNORMAL HIGH (ref <5.7)
Mean Plasma Glucose: 140 mg/dL — ABNORMAL HIGH (ref <117)

## 2012-09-22 MED ORDER — ONDANSETRON HCL 4 MG/2ML IJ SOLN: 4.0000 mg | Freq: Four times a day (QID) | INTRAMUSCULAR | Status: DC | PRN

## 2012-09-22 MED ORDER — DIPHENHYDRAMINE HCL 12.5 MG/5ML PO ELIX: 12.5000 mg | ORAL_SOLUTION | Freq: Four times a day (QID) | ORAL | Status: DC | PRN

## 2012-09-22 MED ORDER — ACETAMINOPHEN 10 MG/ML IV SOLN
1000.0000 mg | Freq: Four times a day (QID) | INTRAVENOUS | Status: AC
  Administered 2012-09-22 – 2012-09-23 (×4): 1000 mg via INTRAVENOUS
  Filled 2012-09-22 (×5): qty 100

## 2012-09-22 MED ORDER — POTASSIUM CHLORIDE IN NACL 20-0.9 MEQ/L-% IV SOLN
INTRAVENOUS | Status: DC
  Administered 2012-09-22 – 2012-09-25 (×3): via INTRAVENOUS
  Filled 2012-09-22 (×5): qty 1000

## 2012-09-22 MED ORDER — FENTANYL 10 MCG/ML IV SOLN: INTRAVENOUS | Status: DC

## 2012-09-22 MED ORDER — HYDROMORPHONE 0.3 MG/ML IV SOLN: INTRAVENOUS | Status: DC

## 2012-09-22 MED ORDER — SODIUM CHLORIDE 0.9 % IV SOLN
500.0000 mg | Freq: Three times a day (TID) | INTRAVENOUS | Status: DC
  Administered 2012-09-22 – 2012-09-23 (×3): 500 mg via INTRAVENOUS
  Filled 2012-09-22 (×4): qty 500

## 2012-09-22 MED ORDER — FENTANYL 10 MCG/ML IV SOLN
INTRAVENOUS | Status: DC
  Filled 2012-09-22: qty 50

## 2012-09-22 MED ORDER — ALPRAZOLAM 0.25 MG PO TABS
0.1250 mg | ORAL_TABLET | Freq: Every day | ORAL | Status: DC | PRN
  Filled 2012-09-22: qty 1

## 2012-09-22 MED ORDER — FENTANYL 10 MCG/ML IV SOLN
INTRAVENOUS | Status: DC
Start: 2012-09-22 — End: 2012-09-23
  Administered 2012-09-22: 12:00:00 via INTRAVENOUS
  Administered 2012-09-22: 30 ug/h via INTRAVENOUS
  Administered 2012-09-22: 10 ug/h via INTRAVENOUS
  Administered 2012-09-23: 20 ug via INTRAVENOUS
  Administered 2012-09-23: 20 ug/h via INTRAVENOUS
  Administered 2012-09-23: 20 ug via INTRAVENOUS
  Administered 2012-09-23: 20 ug/h via INTRAVENOUS
  Filled 2012-09-22: qty 50

## 2012-09-22 MED ORDER — NALOXONE HCL 0.4 MG/ML IJ SOLN: 0.4000 mg | INTRAMUSCULAR | Status: DC | PRN

## 2012-09-22 MED ORDER — ENOXAPARIN SODIUM 40 MG/0.4ML ~~LOC~~ SOLN
40.0000 mg | SUBCUTANEOUS | Status: DC
  Administered 2012-09-22 – 2012-09-26 (×5): 40 mg via SUBCUTANEOUS
  Filled 2012-09-22 (×6): qty 0.4

## 2012-09-22 MED ORDER — SODIUM CHLORIDE 0.9 % IJ SOLN: 9.0000 mL | INTRAMUSCULAR | Status: DC | PRN

## 2012-09-22 MED ORDER — DIPHENHYDRAMINE HCL 50 MG/ML IJ SOLN: 12.5000 mg | Freq: Four times a day (QID) | INTRAMUSCULAR | Status: DC | PRN

## 2012-09-22 MED ORDER — CHLORHEXIDINE GLUCONATE 0.12 % MT SOLN
15.0000 mL | Freq: Two times a day (BID) | OROMUCOSAL | Status: DC
  Administered 2012-09-23 – 2012-09-26 (×8): 15 mL via OROMUCOSAL
  Filled 2012-09-22 (×12): qty 15

## 2012-09-22 NOTE — Progress Notes (Signed)
ANTIBIOTIC CONSULT NOTE - FOLLOW UP  Pharmacy Consult for Primaxin Indication: Perforated sigmoid diverticulitis vs pelvic abscess and peritonitis  Allergies  Allergen Reactions  . Codeine   . Fosamax (Alendronate Sodium)   . Latex   . Vioxx (Rofecoxib)     Patient Measurements: Height: 5\' 2"  (157.5 cm) Weight: 147 lb 14.9 oz (67.1 kg) IBW/kg (Calculated) : 50.1 Adjusted Body Weight:   Vital Signs: Temp: 98.9 F (37.2 C) (02/17 0523) Temp src: Oral (02/17 0523) BP: 126/70 mmHg (02/17 0523) Pulse Rate: 95 (02/17 0523) Intake/Output from previous day: 02/16 0701 - 02/17 0700 In: 2555 [I.V.:2155; IV Piggyback:400] Out: 1050 [Urine:950; Drains:100] Intake/Output from this shift: Total I/O In: 361.7 [I.V.:361.7] Out: 701 [Urine:650; Drains:50; Stool:1]  Labs:  Recent Labs  09/20/12 0346 09/21/12 0337 09/21/12 1416 09/22/12 0420  WBC 12.2* 18.1*  --  17.4*  HGB 12.7 11.7*  --  11.7*  PLT 231 263  --  303  CREATININE 0.59 0.62 0.51 0.62   Estimated Creatinine Clearance: 58.8 ml/min (by C-G formula based on Cr of 0.62). No results found for this basename: VANCOTROUGH, Leodis Binet, VANCORANDOM, GENTTROUGH, GENTPEAK, GENTRANDOM, TOBRATROUGH, TOBRAPEAK, TOBRARND, AMIKACINPEAK, AMIKACINTROU, AMIKACIN,  in the last 72 hours   Microbiology: Recent Results (from the past 720 hour(s))  SURGICAL PCR SCREEN     Status: None   Collection Time    09/19/12  3:37 PM      Result Value Range Status   MRSA, PCR NEGATIVE  NEGATIVE Final   Staphylococcus aureus NEGATIVE  NEGATIVE Final   Comment:            The Xpert SA Assay (FDA     approved for NASAL specimens     in patients over 45 years of age),     is one component of     a comprehensive surveillance     program.  Test performance has     been validated by [employer] for patients greater     than or equal to 47 year old.     It is not intended     to diagnose infection nor to     guide or monitor treatment.  MRSA  PCR SCREENING     Status: None   Collection Time    09/19/12  9:49 PM      Result Value Range Status   MRSA by PCR NEGATIVE  NEGATIVE Final   Comment:            The GeneXpert MRSA Assay (FDA     approved for NASAL specimens     only), is one component of a     comprehensive MRSA colonization     surveillance program. It is not     intended to diagnose MRSA     infection nor to guide or     monitor treatment for     MRSA infections.    Anti-infectives   Start     Dose/Rate Route Frequency Ordered Stop   09/22/12 2100  imipenem-cilastatin (PRIMAXIN) 500 mg in sodium chloride 0.9 % 100 mL IVPB     500 mg 200 mL/hr over 30 Minutes Intravenous Every 8 hours 09/22/12 1255     09/18/12 1200  imipenem-cilastatin (PRIMAXIN) 250 mg in sodium chloride 0.9 % 100 mL IVPB  Status:  Discontinued     250 mg 200 mL/hr over 30 Minutes Intravenous 4 times per day 09/18/12 1111 09/22/12 1254   09/16/12 1500  imipenem-cilastatin (PRIMAXIN) 500 mg in sodium chloride 0.9 % 100 mL IVPB  Status:  Discontinued     500 mg 200 mL/hr over 30 Minutes Intravenous 3 times per day 09/16/12 1449 09/18/12 1111   09/14/12 2200  ciprofloxacin (CIPRO) IVPB 400 mg  Status:  Discontinued     400 mg 200 mL/hr over 60 Minutes Intravenous 2 times daily 09/14/12 2120 09/16/12 1415   09/14/12 2200  metroNIDAZOLE (FLAGYL) IVPB 500 mg  Status:  Discontinued     500 mg 100 mL/hr over 60 Minutes Intravenous 3 times per day 09/14/12 2120 09/16/12 1415      Assessment: 72 yoF on D#7 primaxin for perforated sigmoid diverticulitis vs pelvic abscess and peritonitis.  Currently POD3 for ex lap, sigmoid colectomy with end colostomy.  Will increase dose of primaxin to 500mg  IV q 8 hours due to weight and CrCl (N) of 95 ml/min.    Cultures: MRSA PCR neg.    WBC elevated at 17.4 (small decrease)  Afebrile  Pt to start clears today  Plan:  1. Primaxin 500mg  IV q 8 hours 2. F/u WBC, T, clinical course  Haynes Hoehn,  PharmD 09/22/2012 1:01 PM  Pager: [phone]

## 2012-09-22 NOTE — Progress Notes (Signed)
OT Cancellation Note  Patient Details Name: KAMEKA WHAN MRN: [id number] DOB: 12-19-1940   Cancelled Treatment:    Reason Eval/Treat Not Completed:  (pt washing hair with family, then with PT. Will attempt next day)  Halil Rentz, [facility] 09/22/2012, 2:43 PM

## 2012-09-22 NOTE — Progress Notes (Signed)
[facility] PROGRESS NOTE  Alice Anthony OZH:[id number] DOB: March 30, 1941 DOA: 09/14/2012  PCP: [name], MD  Brief HPI: Alice Anthony is a 72 y.o. female with past medical history of hypertension and diverticulosis. Patient came to the hospital with abdominal pain. Patient said she was in her usual state of health till the morning of admission, when she started to have some cramping in her lower abdomen. She had nausea and she vomited more than 5 times. Patient had regular bowel movements in the morning, after that started to be mushy. She also had low-grade fever documented in the ED with temperature of 99.8. Patient has known diverticulosis, she said she had a bout of diverticulitis before which was treated at home. Initial evaluation in the emergency department with CT scan showed collection of fluids in the pelvis, thickening of the colon and questionable partial small bowel obstruction. Patient admitted to the hospital for further evaluation.  Past medical history:  Past Medical History  Diagnosis Date  . Diverticulosis   . Hypertension   . Hyperlipemia     Consultants: [facility], General Surgery  Procedures: Exploratory laparotomy, sigmoid colectomy and end colostomy on 09/19/12  Antibiotics: Primaxin 2/11--> Was on Cipro/Flagyl initially  Subjective: Patient feels better. Pain is better. Was OOB to chair yesterday. Going to ambulate today. No nausea.   Objective: Vital Signs  Filed Vitals:   09/22/12 0144 09/22/12 0400 09/22/12 0523 09/22/12 0813  BP: 150/80  126/70   Pulse: 92  95   Temp: 98.9 F (37.2 C)  98.9 F (37.2 C)   TempSrc: Oral  Oral   Resp: 18 20 18 16   Height:      Weight:      SpO2: 95% 94% 98% 98%    Intake/Output Summary (Last 24 hours) at 09/22/12 1318 Last data filed at 09/22/12 1031  Gross per 24 hour  Intake 2816.67 ml  Output   1451 ml  Net 1365.67 ml   Filed Weights   09/15/12 0500 09/19/12 2051 09/20/12 0600  Weight: 63.504  kg (140 lb) 63.9 kg (140 lb 14 oz) 67.1 kg (147 lb 14.9 oz)    General appearance: Alert, awake. Cooperative, appears stated age and no distress Resp: clear to auscultation bilaterally with decreased air entry at the bases Cardio: regular rate and rhythm, S1, S2 normal, no murmur, click, rub or gallop GI: Covered with dressing. Ostomy noted. Very few bowel sounds. Tender to palpation without rebound. No masses or organomegaly.  Neurologic: Alert and oriented. No focal deficits.  Lab Results:  Basic Metabolic Panel:  Recent Labs Lab 09/19/12 0445 09/20/12 0346 09/21/12 0337 09/21/12 1416 09/22/12 0420  NA 135 133* 132* 131* 136  K 4.1 4.1 5.3* 3.9 3.7  CL 100 100 97 95* 102  CO2 28 28 31 28 29   GLUCOSE 154* 190* 160* 131* 160*  BUN 5* 4* 4* 4* 4*  CREATININE 0.66 0.59 0.62 0.51 0.62  CALCIUM 8.0* 7.7* 8.1* 8.0* 8.2*   Liver Function Tests:  Recent Labs Lab 09/17/12 0518  AST 36  ALT 14  ALKPHOS 52  BILITOT 0.3  PROT 6.0  ALBUMIN 2.8*    Recent Labs Lab 09/17/12 0518  LIPASE 11   CBC:  Recent Labs Lab 09/18/12 0525 09/19/12 0445 09/20/12 0346 09/21/12 0337 09/22/12 0420  WBC 11.2* 12.1* 12.2* 18.1* 17.4*  HGB 11.3* 11.8* 12.7 11.7* 11.7*  HCT 32.9* 34.1* 37.2 34.5* 35.1*  MCV 93.2 92.4 92.5 93.0 93.6  PLT 184  PLATELET CLUMPS NOTED ON SMEAR, COUNT APPEARS ADEQUATE 231 263 303   CBG:  Recent Labs Lab 09/21/12 2006 09/21/12 2324 09/22/12 0400 09/22/12 0739 09/22/12 1147  GLUCAP 129* 155* 131* 122* 122*     Studies/Results: No results found.  Medications:  Scheduled: . acetaminophen  1,000 mg Intravenous Q6H  . enoxaparin (LOVENOX) injection  40 mg Subcutaneous Q24H  . fentaNYL   Intravenous Q4H  . fluticasone  1 spray Each Nare QHS  . imipenem-cilastatin  500 mg Intravenous Q8H  . insulin aspart  0-9 Units Subcutaneous Q4H  . pantoprazole (PROTONIX) IV  40 mg Intravenous Q24H   Continuous: . 0.9 % NaCl with KCl 20 mEq / L 75 mL/hr at  09/22/12 1048   AVW:[id number], acetaminophen, ALPRAZolam, alum & mag hydroxide-simeth, diphenhydrAMINE, diphenhydrAMINE, lip balm, naloxone, ondansetron (ZOFRAN) IV, ondansetron (ZOFRAN) IV, phenol, sodium chloride  Assessment/Plan:  Active Problems:   Nausea and vomiting   Abdominal pain   Colon wall thickening   Abnormal CT scan, pelvis   Hypertension   Acute diverticulitis   Perforated bowel   Intra-abdominal abscess   Hyperkalemia    Acute Diverticulitis with perforation and Abscess formation Status post sigmoid colectomy and end colostomy. Patient appears to be medically stable. On Imipenem. Management per surgery at this point.   Abnormal Urinary Bladder on CT Showed small focus of the posterior bladder may represent contrast collecting posteriorly versus bladder lesion. This can be followed as outpatient.   Colonic wall thickening  Per CT scan report this could be secondary to the diverticulosis but neoplasm also a possibility. Patient has had colonoscopy in August of 2012 or 2013?, reported abnormal findings only was diverticulosis. Most likely due to infectious etiology resulting in inflammatory changes.    History of Hypertension  Continue to monitor. BP is stable. She was on Amlodipine at home which is being held.  Hyperkalemia Down to normal.   DVT Prophylaxis Heparin  Code Status: Full Code Family Communication: Discussed with patient.  Disposition Plan: Further management per general surgery.   Medicine will sign off and will be available as needed.   LOS: 8 days   [facility]  [facility] Pager [phone] 09/22/2012, 1:18 PM  If 8PM-8AM, please contact night-coverage at [email or web address], password [facility]

## 2012-09-22 NOTE — Consult Note (Signed)
WOC ostomy consult: Initial Consult Stoma type/location: LLQ Colostomy (Temporary, per daughter and patient) Stomal assessment/size: visualized through pouch, approximately 1 and 1/4 inch. Peristomal assessment: Not seen today Treatment options for stomal/peristomal skin: None indicated Output No stool, no flatus in pouch Ostomy pouching: Currently wearing a 2-piece ostomy pouching system.  Daughters assisting with personal care at the moment, Care Management and PT waiting to see patient.  I will plan for pouching change and stomal assessment tomorrow. Education provided: Patient and family taught that there is a Aeronautical engineer who will be assisting them in this postoperative phase; sizing stoma, selecting initial pouching system and teaching about the care and management of a stoma.  An educational booklet is provided to the family and patient at this time as well as a website that they may visit for consistent and correct information ([employer]).  (Daughter stated that she had been surfing the web for information.)  As she has so many others waiting to see her this afternoon, I will plan a pouch change for tomorrow. I will follow along with you. Thanks, Ladona Mow, MSN, RN, [facility], CWOCN [phone])

## 2012-09-22 NOTE — Progress Notes (Signed)
INITIAL NUTRITION ASSESSMENT  DOCUMENTATION CODES Per approved criteria  -Not Applicable   INTERVENTION: - Diet advancement per MD. If diet unable to be advanced in next 1-2 days, recommend TPN since pt with recent GI surgery - Education for nutrition with colostomy provided, RD contact information given - Will continue to monitor   NUTRITION DIAGNOSIS: Inadequate oral intake related to clear liquid diet as evidenced by diet order.   Goal: Advance diet as tolerated to low fiber diet.   Monitor:  Weights, labs, diet advancement, colostomy output  Reason for Assessment: NPO/clear liquids x 7 days  72 y.o. female  Admitting Dx: Abdominal pain  ASSESSMENT: Pt admitted with abdominal pain, vomiting, and increasing loose stool. CT scan suggested small bowel obstruction. GI consulted and started pt on Miralax to keep stool soft. Pt developed sudden onset of pain the morning of 2/14. Surgery was consulted. Pt found to have perforated diverticulitis. POD# 3 exploratory laparotomy with colectomy and end colostomy.   Pt reports eating well PTA with good appetite and intake and stable weight. Pt now day 8 of no solid food. Pt reports she wants to eat chocolate cake. Discussed recommended foods and foods to avoid with new colostomy.   Height: Ht Readings from Last 1 Encounters:  09/19/12 5\' 2"  (1.575 m)    Weight: Wt Readings from Last 1 Encounters:  09/20/12 147 lb 14.9 oz (67.1 kg)    Ideal Body Weight: 110 lb  % Ideal Body Weight: 134  Wt Readings from Last 10 Encounters:  09/20/12 147 lb 14.9 oz (67.1 kg)  09/20/12 147 lb 14.9 oz (67.1 kg)    Usual Body Weight: 147 lb  % Usual Body Weight: 100  BMI:  Body mass index is 27.05 kg/(m^2).  Estimated Nutritional Needs: Kcal: 1400-1700 Protein: 80-95g Fluid: 1.4-1.7L/day  Skin: Abdominal incision from exploratory laparotomy, sigmoid colectomy with end colostomy   Diet Order:  NPO  EDUCATION NEEDS: -Education needs  provided for new colostomy and what foods to eat/avoid   Intake/Output Summary (Last 24 hours) at 09/22/12 1034 Last data filed at 09/22/12 1031  Gross per 24 hour  Intake 2816.67 ml  Output   1470 ml  Net 1346.67 ml    Last BM: 20ml output from colostomy so far today  Labs:   Recent Labs Lab 09/21/12 0337 09/21/12 1416 09/22/12 0420  NA 132* 131* 136  K 5.3* 3.9 3.7  CL 97 95* 102  CO2 31 28 29   BUN 4* 4* 4*  CREATININE 0.62 0.51 0.62  CALCIUM 8.1* 8.0* 8.2*  GLUCOSE 160* 131* 160*    CBG (last 3)   Recent Labs  09/21/12 2324 09/22/12 0400 09/22/12 0739  GLUCAP 155* 131* 122*    Scheduled Meds: . acetaminophen  1,000 mg Intravenous Q6H  . enoxaparin (LOVENOX) injection  40 mg Subcutaneous Q24H  . fentaNYL   Intravenous Q4H  . fentaNYL   Intravenous Q4H  . fluticasone  1 spray Each Nare QHS  . imipenem-cilastatin  250 mg Intravenous Q6H  . insulin aspart  0-9 Units Subcutaneous Q4H  . pantoprazole (PROTONIX) IV  40 mg Intravenous Q24H    Continuous Infusions: . 0.9 % NaCl with KCl 20 mEq / L 100 mL/hr at 09/22/12 1010    Past Medical History  Diagnosis Date  . Diverticulosis   . Hypertension   . Hyperlipemia     Past Surgical History  Procedure Laterality Date  . Tubal ligation    . Tonsillectomy  Artice Holohan Baron MS, RD, LDN [phone] Pager [phone] After Hours Pager  

## 2012-09-22 NOTE — Progress Notes (Signed)
3 Days Post-Op  Subjective: Feels OK, Morphine makes her kind of crazy, having bad dreams with it.  Sounds like she was hitting the button whenever it turned green, Not sure how to tell pain from cramping. No flatus or stool in bag.  Clear serous drainage in the bag.  Objective: Vital signs in last 24 hours: Temp:  [98.4 F (36.9 C)-99.2 F (37.3 C)] 98.9 F (37.2 C) (02/17 0523) Pulse Rate:  [91-95] 95 (02/17 0523) Resp:  [16-20] 16 (02/17 0813) BP: (120-150)/(59-80) 126/70 mmHg (02/17 0523) SpO2:  [90 %-99 %] 98 % (02/17 0813) Last BM Date: 09/14/12 100 ml thru drain, NPO,  TM 99.2, WBC is still up, glucose's are all elevated on IV fluids,  Intake/Output from previous day: 02/16 0701 - 02/17 0700 In: 2555 [I.V.:2155; IV Piggyback:400] Out: 1050 [Urine:950; Drains:100] Intake/Output this shift: Total I/O In: -  Out: 300 [Urine:300]  General appearance: alert, cooperative and no distress Resp: rale both bases Right greater than the left. GI: soft, very tender, no gas or stool in ostomy. Jp drain is clear.  Open wound is clean and looks good.  Lab Results:   Recent Labs  09/21/12 0337 09/22/12 0420  WBC 18.1* 17.4*  HGB 11.7* 11.7*  HCT 34.5* 35.1*  PLT 263 303    BMET  Recent Labs  09/21/12 1416 09/22/12 0420  NA 131* 136  K 3.9 3.7  CL 95* 102  CO2 28 29  GLUCOSE 131* 160*  BUN 4* 4*  CREATININE 0.51 0.62  CALCIUM 8.0* 8.2*   PT/INR No results found for this basename: LABPROT, INR,  in the last 72 hours   Recent Labs Lab 09/17/12 0518  AST 36  ALT 14  ALKPHOS 52  BILITOT 0.3  PROT 6.0  ALBUMIN 2.8*     Lipase     Component Value Date/Time   LIPASE 11 09/17/2012 0518     Studies/Results: No results found.  Medications: . fluticasone  1 spray Each Nare QHS  . HYDROmorphone PCA 0.3 mg/mL   Intravenous Q4H  . imipenem-cilastatin  250 mg Intravenous Q6H  . insulin aspart  0-9 Units Subcutaneous Q4H  . ondansetron (ZOFRAN) IV  4 mg  Intravenous Q6H   Or  . ondansetron  4 mg Oral Q6H  . pantoprazole (PROTONIX) IV  40 mg Intravenous Q24H    Assessment/Plan Perforated sigmoid diverticulitis versus large pelvic abscess and peritonitis. Exploratory laparotomy, sigmoid colectomy with end colostomy. 09/20/12, Dr. Luisa Hart.  Hypertension  Hyperlipemia  Hx of recent skin Bx for cancer. Recent tendonitis with steroid injection.  Plan:  Change dilaudid to Fentanyl, add IV tylenol, continue antibiotics, add Lovenox for DVT prophylaxis.IS q1h, mobilize. Check HBA1C either today or in AM, Change IV to NS + KCL.  Ice chips and sips of clears.       LOS: 8 days    Alice Anthony 09/22/2012

## 2012-09-22 NOTE — Progress Notes (Signed)
Physical Therapy Treatment Patient Details Name: Alice Anthony MRN: [id number] DOB: 1941/07/01 Today's Date: 09/22/2012 Time: 9147-8295 PT Time Calculation (min): 23 min  PT Assessment / Plan / Recommendation Comments on Treatment Session  slowly progressing. limited ambulation distance this session due to low battery on IV pole. also, pt sat reading in low 50-60s%...Marland Kitchen? accuracy. Plan is still for home per family. Recommend HHPT and 24 hour supervision.     Follow Up Recommendations  Home health PT     Does the patient have the potential to tolerate intense rehabilitation     Barriers to Discharge        Equipment Recommendations  None recommended by PT    Recommendations for Other Services OT consult  Frequency Min 3X/week   Plan Discharge plan remains appropriate    Precautions / Restrictions Precautions Precautions: Fall Restrictions Weight Bearing Restrictions: No   Pertinent Vitals/Pain Low O2 sat reading during ambulation-? Accuracy? (50-60s%).     Mobility  Bed Mobility Bed Mobility: Not assessed Transfers Transfers: Sit to Stand;Stand to Sit Sit to Stand: 4: Min guard;From chair/3-in-1 Stand to Sit: 4: Min guard;To chair/3-in-1 Details for Transfer Assistance: VCs safety, technique, hand placement. Increased time Ambulation/Gait Ambulation/Gait Assistance: 4: Min assist Ambulation Distance (Feet): 75 Feet Assistive device: Rolling walker Ambulation/Gait Assistance Details: Slow gait speed. O2 sat monitor showing 50-60s% while ambulating. After short standing rest break and VCS for deep/pursed lip breathing, sats would show 90% but then quickly drop back down (? accuracy?) Gait Pattern: Step-through pattern;Decreased stride length;Decreased step length - right;Decreased step length - left    Exercises     PT Diagnosis:    PT Problem List:   PT Treatment Interventions:     PT Goals Acute Rehab PT Goals Pt will go Sit to Stand: with modified  independence PT Goal: Sit to Stand - Progress: Progressing toward goal Pt will Ambulate: >150 feet;with modified independence;with least restrictive assistive device PT Goal: Ambulate - Progress: Progressing toward goal  Visit Information  Last PT Received On: 09/22/12 Assistance Needed: +1    Subjective Data  Subjective: I feel a little better Patient Stated Goal: home   Cognition  Cognition Overall Cognitive Status: Appears within functional limits for tasks assessed/performed Arousal/Alertness: Awake/alert Orientation Level: Appears intact for tasks assessed Behavior During Session: [facility] for tasks performed    Balance     End of Session PT - End of Session Activity Tolerance: Patient limited by fatigue;Patient limited by pain Patient left: in chair;with call bell/phone within reach;with family/visitor present   GP     Rebeca Alert [facility] 09/22/2012, 4:09 PM [phone]

## 2012-09-22 NOTE — Progress Notes (Signed)
ATTENDING ADDENDUM:  I personally reviewed patient's record, examined the patient, and formulated the following assessment and plan:  Will switch to fentanyl for pain control, will try some clears today, wbc elevated but slightly decreased, no signs of infection, coughing up clear sputum, no dysuria- will cont abx and follow for now

## 2012-09-23 LAB — BASIC METABOLIC PANEL
BUN: 5 mg/dL — ABNORMAL LOW (ref 6–23)
Chloride: 104 mEq/L (ref 96–112)
Creatinine, Ser: 0.57 mg/dL (ref 0.50–1.10)
GFR calc Af Amer: >90 mL/min (ref >90)
GFR calc non Af Amer: >90 mL/min (ref >90)
Potassium: 3.8 mEq/L (ref 3.5–5.1)

## 2012-09-23 LAB — CBC
HCT: 32.3 % — ABNORMAL LOW (ref 36.0–46.0)
MCHC: 34.1 g/dL (ref 30.0–36.0)
Platelets: 292 10*3/uL (ref 150–400)
RDW: 14.4 % (ref 11.5–15.5)
WBC: 12.4 10*3/uL — ABNORMAL HIGH (ref 4.0–10.5)

## 2012-09-23 LAB — GLUCOSE, CAPILLARY: Glucose-Capillary: 115 mg/dL — ABNORMAL HIGH (ref 70–99)

## 2012-09-23 MED ORDER — OXYCODONE-ACETAMINOPHEN 5-325 MG PO TABS
1.0000 | ORAL_TABLET | ORAL | Status: DC | PRN
  Administered 2012-09-23 – 2012-09-25 (×7): 2 via ORAL
  Filled 2012-09-23: qty 1
  Filled 2012-09-23 (×7): qty 2

## 2012-09-23 MED ORDER — HYOSCYAMINE SULFATE 0.125 MG SL SUBL
0.1250 mg | SUBLINGUAL_TABLET | Freq: Three times a day (TID) | SUBLINGUAL | Status: DC | PRN
  Administered 2012-09-25 – 2012-09-26 (×4): 0.125 mg via SUBLINGUAL
  Filled 2012-09-23 (×4): qty 1

## 2012-09-23 MED ORDER — ALPRAZOLAM 0.25 MG PO TABS
0.2500 mg | ORAL_TABLET | Freq: Three times a day (TID) | ORAL | Status: DC | PRN
  Administered 2012-09-23 – 2012-09-26 (×4): 0.25 mg via ORAL
  Filled 2012-09-23 (×4): qty 1

## 2012-09-23 NOTE — Progress Notes (Signed)
ATTENDING ADDENDUM:  I personally reviewed patient's record, examined the patient, and formulated the following assessment and plan:  Doing well.  Starting to ambulate.  Having bladder spasms,  Will try antispasmodic that she had in the past PT/OT recommending home with [facility] and 24h assistance/supervision.   Completed 7 course of antitbiotics.  WBC trending down.  Will d/c imipenem today Tolerating clears, will advance to soft diet today Will d/c PCA and start PO narcotics today Ostomy RN to do teaching with pt

## 2012-09-23 NOTE — Consult Note (Signed)
WOC ostomy consult  Stoma type/location: LLQ Colostomy Stomal assessment/size: 1 and 3/8 inches: round when you pull upward at 12 o'clock, oval at rest Peristomal assessment: intact, clear Treatment options for stomal/peristomal skin: None indicated Output soft green/brown stool Ostomy pouching: 2pc.,  2 and 1/4 inch pouching system with a skin barrier ring. Education provided: patient instructed on stoma and pouching system characteristics.  Preferred not to observe stoma today but stated she will next time.  Demonstrated ability to perform Lock and Roll pouch closure after 1 demonstration by [employer] Nurse.  Discussion and WOC Nurse demonstration of pouching emptying (on toilet, empty each time she empties her bladder) and cleaning of tail closure (with toilet paper "wick" moistened at the end).  Patient used PCA pump 1x during our session and seemed alert/participative, but will need reiteration.  I believe that daughter took education al booklet home with her yesterday.  I will provide another for patient's use.  Patient is likely going to need [facility] support upon discharge for ostomy and perhaps wound care.  If you agree, please order. Thanks, Ladona Mow, MSN, RN, [facility], CWOCN [phone])

## 2012-09-23 NOTE — Progress Notes (Signed)
4 Days Post-Op  Subjective: Her biggest complaint is bladder spasms, has use hyoscyamine in the past.  Tolerating clears with good stool output from the ostomy.  Having allot of family issues. Daughter needs surgery, husband needs surgery and will do nothing for himself since CABG surgery.  She's very anxious on how to handle everyone's needs.  Objective: Vital signs in last 24 hours: Temp:  [97.6 F (36.4 C)-98.9 F (37.2 C)] 97.6 F (36.4 C) (02/18 0544) Pulse Rate:  [71-96] 71 (02/18 0544) Resp:  [13-18] 16 (02/18 0544) BP: (113-132)/(53-92) 132/53 mmHg (02/18 0544) SpO2:  [95 %-100 %] 99 % (02/18 0544) Last BM Date: 09/22/12 300 PO recorded, some stool reported, Diet: clears started, Afebrile, VSS,WBC is coming down  Intake/Output from previous day: 02/17 0701 - 02/18 0700 In: 1565 [P.O.:300; I.V.:965; IV Piggyback:300] Out: 3406 [Urine:3200; Drains:205; Stool:1] Intake/Output this shift: Total I/O In: -  Out: 550 [Urine:550]  General appearance: alert, cooperative and no distress Resp: clear to auscultation bilaterally GI: soft, still tender, but incision is healing well.  clean and pink.  good green colored stool from her ostomy and gas.  Lab Results:   Recent Labs  09/22/12 0420 09/23/12 0421  WBC 17.4* 12.4*  HGB 11.7* 11.0*  HCT 35.1* 32.3*  PLT 303 292    BMET  Recent Labs  09/22/12 0420 09/23/12 0421  NA 136 139  K 3.7 3.8  CL 102 104  CO2 29 29  GLUCOSE 160* 105*  BUN 4* 5*  CREATININE 0.62 0.57  CALCIUM 8.2* 7.9*   PT/INR No results found for this basename: LABPROT, INR,  in the last 72 hours   Recent Labs Lab 09/17/12 0518  AST 36  ALT 14  ALKPHOS 52  BILITOT 0.3  PROT 6.0  ALBUMIN 2.8*     Lipase     Component Value Date/Time   LIPASE 11 09/17/2012 0518     Studies/Results: No results found.  Medications: . chlorhexidine  15 mL Mouth Rinse BID  . enoxaparin (LOVENOX) injection  40 mg Subcutaneous Q24H  . fentaNYL    Intravenous Q4H  . fluticasone  1 spray Each Nare QHS  . imipenem-cilastatin  500 mg Intravenous Q8H  . insulin aspart  0-9 Units Subcutaneous Q4H  . pantoprazole (PROTONIX) IV  40 mg Intravenous Q24H    Assessment/Plan Perforated sigmoid diverticulitis versus large pelvic abscess and peritonitis. Exploratory laparotomy, sigmoid colectomy with end colostomy. 09/20/12, Dr. Luisa Hart.  Hypertension  Hyperlipemia  Hx of recent skin Bx for cancer.  Recent tendonitis with steroid injection.   Plan:  Advance diet, try some hyoscyamine, low dose, xanax, continue to mobilize and start oral PO meds.  LOS: 9 days    Alice Anthony 09/23/2012

## 2012-09-23 NOTE — Evaluation (Signed)
Occupational Therapy Evaluation Patient Details Name: Alice Anthony MRN: [id number] DOB: 1940/11/24 Today's Date: 09/23/2012 Time: 7829-5621 OT Time Calculation (min): 25 min  OT Assessment / Plan / Recommendation Clinical Impression  Pt presents s/p exploratory laparotomy with colectomy and colostomy placement. Pt with decreased I with ADL activity and will benefit from skilled OT to increase I with ADL activity and return to PLOF    OT Assessment  Patient needs continued OT Services    Follow Up Recommendations  Home health OT;Supervision/Assistance - 24 hour       Equipment Recommendations  Other (comment) (pt has 3 n 1- may need AE)       Frequency  Min 3X/week    Precautions / Restrictions Precautions Precautions: Fall Precaution Comments: colostomy and JP drain Restrictions Weight Bearing Restrictions: No       ADL  Eating/Feeding: Performed;Set up Where Assessed - Eating/Feeding: Chair Grooming: Performed;Wash/dry face;Brushing hair Where Assessed - Grooming: Supported sitting Upper Body Bathing: Simulated;Moderate assistance Where Assessed - Upper Body Bathing: Unsupported sitting Lower Body Bathing: Simulated;Maximal assistance Where Assessed - Lower Body Bathing: Supported sit to stand Upper Body Dressing: Simulated;Moderate assistance Where Assessed - Upper Body Dressing: Unsupported sitting Lower Body Dressing: Simulated;Maximal assistance Where Assessed - Lower Body Dressing: Supported sit to stand Toilet Transfer: Performed;Minimal assistance Toilet Transfer Method: Sit to stand;Stand pivot Acupuncturist: Bedside commode Toileting - Clothing Manipulation and Hygiene: Performed;Minimal assistance Where Assessed - Engineer, mining and Hygiene: Standing    OT Diagnosis: Generalized weakness;Acute pain  OT Problem List: Decreased strength;Decreased safety awareness;Decreased activity tolerance;Pain;Decreased knowledge of use of  DME or AE OT Treatment Interventions: Self-care/ADL training;Patient/family education;DME and/or AE instruction   OT Goals Acute Rehab OT Goals OT Goal Formulation: With patient Time For Goal Achievement: 10/07/12 Potential to Achieve Goals: Good ADL Goals Pt Will Perform Grooming: with supervision;Standing at sink ADL Goal: Grooming - Progress: Goal set today Pt Will Perform Upper Body Dressing: with set-up;Sit to stand from chair ADL Goal: Upper Body Dressing - Progress: Goal set today Pt Will Perform Lower Body Dressing: with supervision;with adaptive equipment;Sit to stand from chair ADL Goal: Lower Body Dressing - Progress: Goal set today Pt Will Transfer to Toilet: with supervision;Comfort height toilet ADL Goal: Toilet Transfer - Progress: Goal set today Pt Will Perform Toileting - Clothing Manipulation: with supervision;Standing;Sitting on 3-in-1 or toilet ADL Goal: Toileting - Clothing Manipulation - Progress: Goal set today  Visit Information  Last OT Received On: 09/23/12    Subjective Data  Subjective: I think i can take care of myself   Prior Functioning     Home Living Lives With: Spouse Available Help at Discharge: Family;Available 24 hours/day Type of Home: House Home Access: Stairs to enter [employer] of Steps: 1 small step Home Layout: One level Bathroom Shower/Tub: Tub/shower unit;Walk-in shower Bathroom Toilet: Standard Home Adaptive Equipment: Grab bars in shower;Walker - rolling Prior Function Level of Independence: Independent Able to Take Stairs?: Yes Driving: Yes Communication Communication: No difficulties         Vision/Perception Vision - History Patient Visual Report: No change from baseline   Cognition  Cognition Overall Cognitive Status: Appears within functional limits for tasks assessed/performed Arousal/Alertness: Awake/alert Orientation Level: Appears intact for tasks assessed Behavior During Session: [facility] for  tasks performed    Extremity/Trunk Assessment Right Upper Extremity Assessment RUE ROM/Strength/Tone: [facility] for tasks assessed Left Upper Extremity Assessment LUE ROM/Strength/Tone: [facility] for tasks assessed     Mobility Bed Mobility  Bed Mobility: Supine to Sit;Sitting - Scoot to Edge of Bed Rolling Left: 4: Min assist;With rail Left Sidelying to Sit: HOB elevated;With rails Supine to Sit: 4: Min assist;HOB elevated;With rails Sitting - Scoot to Edge of Bed: 4: Min assist;With rail Transfers Transfers: Sit to Stand;Stand to Sit Sit to Stand: 4: Min assist;From bed;From toilet;With armrests;With upper extremity assist Stand to Sit: 4: Min assist;To toilet;To chair/3-in-1;With armrests;With upper extremity assist           End of Session  Pt left in chair with call bell, phone and cell phone with in reach.  GO     Alba Cory 09/23/2012, 10:02 AM

## 2012-09-24 LAB — BASIC METABOLIC PANEL
BUN: 8 mg/dL (ref 6–23)
CO2: 30 mEq/L (ref 19–32)
Calcium: 8.1 mg/dL — ABNORMAL LOW (ref 8.4–10.5)
Chloride: 105 mEq/L (ref 96–112)
Creatinine, Ser: 0.68 mg/dL (ref 0.50–1.10)
GFR calc Af Amer: >90 mL/min (ref >90)

## 2012-09-24 LAB — GLUCOSE, CAPILLARY
Glucose-Capillary: 159 mg/dL — ABNORMAL HIGH (ref 70–99)
Glucose-Capillary: 131 mg/dL — ABNORMAL HIGH (ref 70–99)
Glucose-Capillary: 154 mg/dL — ABNORMAL HIGH (ref 70–99)

## 2012-09-24 LAB — CBC
HCT: 31.5 % — ABNORMAL LOW (ref 36.0–46.0)
MCHC: 33 g/dL (ref 30.0–36.0)
MCV: 94.6 fL (ref 78.0–100.0)
Platelets: 324 10*3/uL (ref 150–400)
RDW: 13.9 % (ref 11.5–15.5)
WBC: 9.4 10*3/uL (ref 4.0–10.5)

## 2012-09-24 NOTE — Progress Notes (Signed)
Physical Therapy Treatment Patient Details Name: TEMPEST FRANKLAND MRN: [id number] DOB: [birth year] Today's Date: 09/24/2012 Time: 4098-1191 PT Time Calculation (min): 18 min  PT Assessment / Plan / Recommendation Comments on Treatment Session  Pt. ambulated x 400 ft. today with only IV pole. Pt. encouraged by progress.    Follow Up Recommendations  Home health PT     Does the patient have the potential to tolerate intense rehabilitation     Barriers to Discharge        Equipment Recommendations  None recommended by PT    Recommendations for Other Services OT consult  Frequency Min 3X/week   Plan Discharge plan remains appropriate    Precautions / Restrictions Precautions Precaution Comments: colostomy and JP drain   Pertinent Vitals/Pain No pain at this  Time.    Mobility  Bed Mobility Bed Mobility: Sit to Supine Sit to Supine: 6: Modified independent (Device/Increase time);HOB elevated;With rail Transfers Sit to Stand: 5: Supervision;With armrests;From chair/3-in-1;From toilet;With upper extremity assist Stand to Sit: 5: Supervision;With upper extremity assist;With armrests;To bed;To toilet Details for Transfer Assistance: VCs safety, technique, hand placement. Increased time Ambulation/Gait Ambulation/Gait Assistance: 4: Min guard Ambulation Distance (Feet): 400 Feet Assistive device:  (IV pole) Ambulation/Gait Assistance Details: pt . tolerated much farther distance today. Gait Pattern: Step-through pattern;Trunk flexed;Decreased stride length    Exercises     PT Diagnosis:    PT Problem List:   PT Treatment Interventions:     PT Goals Acute Rehab PT Goals Pt will go Sit to Supine/Side: with supervision;Independently;with HOB 0 degrees PT Goal: Sit to Supine/Side - Progress: Updated due to goal met Pt will go Sit to Stand: with modified independence PT Goal: Sit to Stand - Progress: Progressing toward goal Pt will Ambulate: >150 feet;with modified  independence;with least restrictive assistive device PT Goal: Ambulate - Progress: Progressing toward goal  Visit Information  Last PT Received On: 09/24/12 Assistance Needed: +1    Subjective Data  Subjective: I am limited by spasms   Cognition  Cognition Overall Cognitive Status: Appears within functional limits for tasks assessed/performed    Balance     End of Session PT - End of Session Activity Tolerance: Patient tolerated treatment well Patient left: in bed;with call bell/phone within reach Nurse Communication: Mobility status   GP     Rada Hay 09/24/2012, 10:25 AM [phone]

## 2012-09-24 NOTE — Progress Notes (Signed)
I personally reviewed patient's record, examined the patient, and formulated the following assessment and plan:  Doing well.  Tolerating a diet.  Relatively comfortable with her ostomy care.  Plan for home with home health within the next couple days.

## 2012-09-24 NOTE — Progress Notes (Signed)
5 Days Post-Op  Subjective: Feels good, slept well with xanax, taking PO's well and ostomy is functioning well.  Objective: Vital signs in last 24 hours: Temp:  [97.6 F (36.4 C)-98.6 F (37 C)] 97.6 F (36.4 C) (02/19 0628) Pulse Rate:  [69-89] 69 (02/19 0628) Resp:  [14-16] 16 (02/19 0628) BP: (125-143)/(52-77) 143/65 mmHg (02/19 0628) SpO2:  [94 %-100 %] 97 % (02/19 0628) Last BM Date: 09/22/12  900 ml PO recorded, 25 ml from ostomy recorded, soft diet, afebrile, VSS, Labs all look good Intake/Output from previous day: 02/18 0701 - 02/19 0700 In: 2963.1 [P.O.:900; I.V.:1763.1; IV Piggyback:300] Out: 2905 [Urine:2775; Drains:105; Stool:25] Intake/Output this shift:    General appearance: alert, cooperative and no distress Resp: clear to auscultation bilaterally GI: soft, non-tender; bowel sounds normal; no masses,  no organomegaly and wounds look good, packed wet to dry, ostomy working well.  Lab Results:   Recent Labs  09/23/12 0421 09/24/12 0410  WBC 12.4* 9.4  HGB 11.0* 10.4*  HCT 32.3* 31.5*  PLT 292 324    BMET  Recent Labs  09/23/12 0421 09/24/12 0410  NA 139 141  K 3.8 4.0  CL 104 105  CO2 29 30  GLUCOSE 105* 118*  BUN 5* 8  CREATININE 0.57 0.68  CALCIUM 7.9* 8.1*   PT/INR No results found for this basename: LABPROT, INR,  in the last 72 hours  No results found for this basename: AST, ALT, ALKPHOS, BILITOT, PROT, ALBUMIN,  in the last 168 hours   Lipase     Component Value Date/Time   LIPASE 11 09/17/2012 0518     Studies/Results: No results found.  Medications: . chlorhexidine  15 mL Mouth Rinse BID  . enoxaparin (LOVENOX) injection  40 mg Subcutaneous Q24H  . fluticasone  1 spray Each Nare QHS  . insulin aspart  0-9 Units Subcutaneous Q4H  . pantoprazole (PROTONIX) IV  40 mg Intravenous Q24H    Assessment/Plan Perforated sigmoid diverticulitis versus large pelvic abscess and peritonitis. Exploratory laparotomy, sigmoid colectomy  with end colostomy. 09/20/12, Dr. Luisa Hart.  Hypertension  Hyperlipemia  Hx of recent skin Bx for cancer.  Recent tendonitis with steroid injection.  Plan:  Continue to mobilize, advance diet.  She is going to talk to family about d/c plans.  If she continues to do well I hope she can go home tomorrow.   LOS: 10 days    Alice Anthony 09/24/2012

## 2012-09-25 LAB — GLUCOSE, CAPILLARY: Glucose-Capillary: 110 mg/dL — ABNORMAL HIGH (ref 70–99)

## 2012-09-25 MED ORDER — PROMETHAZINE HCL 25 MG/ML IJ SOLN
6.2500 mg | Freq: Four times a day (QID) | INTRAMUSCULAR | Status: DC | PRN
  Administered 2012-09-25: 12.5 mg via INTRAVENOUS
  Filled 2012-09-25: qty 1

## 2012-09-25 MED ORDER — TRAMADOL HCL 50 MG PO TABS: 50.0000 mg | ORAL_TABLET | Freq: Four times a day (QID) | ORAL | Status: DC | PRN

## 2012-09-25 MED ORDER — TRAMADOL HCL 50 MG PO TABS
50.0000 mg | ORAL_TABLET | Freq: Four times a day (QID) | ORAL | Status: DC | PRN
  Administered 2012-09-25 (×3): 50 mg via ORAL
  Administered 2012-09-26 (×3): 100 mg via ORAL
  Filled 2012-09-25: qty 1
  Filled 2012-09-25 (×2): qty 2
  Filled 2012-09-25: qty 1
  Filled 2012-09-25: qty 2
  Filled 2012-09-25: qty 1

## 2012-09-25 MED ORDER — HYDROCODONE-ACETAMINOPHEN 5-325 MG PO TABS
1.0000 | ORAL_TABLET | ORAL | Status: DC | PRN
  Administered 2012-09-27 (×3): 1 via ORAL
  Filled 2012-09-25 (×3): qty 1

## 2012-09-25 NOTE — Progress Notes (Signed)
PT Cancellation Note  _X_Treatment cancelled today due to medical issues with patient which prohibited therapy........Marland Kitchenpain  ___ Treatment cancelled today due to patient receiving procedure or test   ___ Treatment cancelled today due to patient's refusal to participate   Felecia Shelling  PTA [facility]  Acute  Rehab Pager      [phone]

## 2012-09-25 NOTE — Progress Notes (Signed)
Occupational Therapy Treatment Patient Details Name: Alice Anthony MRN: [id number] DOB: [birth year] Today's Date: 09/25/2012 Time: 3086-5784 OT Time Calculation (min): 25 min  OT Assessment / Plan / Recommendation    Follow Up Recommendations  Home health OT                Plan Discharge plan remains appropriate    Precautions / Restrictions Precautions Precautions: Fall Precaution Comments: colostomy and JP drain Restrictions Weight Bearing Restrictions: No       ADL  Lower Body Bathing: Performed;Minimal assistance Where Assessed - Lower Body Bathing: Unsupported sit to stand Upper Body Dressing: Performed;Min guard Where Assessed - Upper Body Dressing: Unsupported standing Lower Body Dressing: Performed;Minimal assistance Where Assessed - Lower Body Dressing: Unsupported sit to stand Transfers/Ambulation Related to ADLs: Pt not feeling as well today. Pt performed bath sitting in chair with standing when needed with overall min A , but pt very fatigued at end of session.      OT Goals ADL Goals Pt Will Perform Upper Body Bathing: with set-up;Sit to stand from chair ADL Goal: Upper Body Bathing - Progress: Goal set today Pt Will Perform Lower Body Bathing: with supervision;Sit to stand from chair ADL Goal: Lower Body Bathing - Progress: Goal set today ADL Goal: Lower Body Dressing - Progress: Progressing toward goals  Visit Information  Last OT Received On: 09/25/12    Subjective Data  Subjective: i am not feeling as well today- pain and nausea are limiting me today      Cognition  Cognition Overall Cognitive Status: Appears within functional limits for tasks assessed/performed Arousal/Alertness: Awake/alert Orientation Level: Appears intact for tasks assessed Behavior During Session: [facility] for tasks performed    Mobility  Transfers Transfers: Sit to Stand;Stand to Sit Sit to Stand: 4: Min guard;With armrests;From chair/3-in-1;With upper extremity  assist Stand to Sit: 4: Min guard;With armrests;To chair/3-in-1;With upper extremity assist          End of Session OT - End of Session Activity Tolerance: Patient limited by fatigue Pt left in chair with call bell and cell phone with in reach  GO     Damian Buckles, Metro Kung 09/25/2012, 11:51 AM

## 2012-09-25 NOTE — Progress Notes (Signed)
ATTENDING ADDENDUM:  I personally reviewed patient's record, examined the patient, and formulated the following assessment and plan:  She is having nausea that she feels is related to the narcotics.  We will try a different type.  Hopefully she will be ready for d/c as soon as we find a pain regimen that works better.

## 2012-09-25 NOTE — Care Management Note (Signed)
    Page 1 of 2   09/25/2012     11:52:15 AM   CARE MANAGEMENT NOTE 09/25/2012  Patient:  Alice Anthony, Alice Anthony   Account Number:  [id number]  Date Initiated:  09/15/2012  Documentation initiated by:  [facility]  Subjective/Objective Assessment:   72 year old female admitted with diverticulitis.     Action/Plan:   Pt lived at home with family PTA.   Anticipated DC Date:  09/25/2012   Anticipated DC Plan:  HOME W HOME HEALTH SERVICES      DC Planning Services  CM consult      [facility] Choice  HOME HEALTH   Choice offered to / List presented to:          [facility] arranged  HH-1 RN  HH-2 PT  HH-3 OT      [facility] agency  [facility]   Status of service:  Completed, signed off Medicare Important Message given?  NA - LOS <3 / Initial given by admissions (If response is "NO", the following Medicare IM given date fields will be blank) Date Medicare IM given:   Date Additional Medicare IM given:    Discharge Disposition:  HOME W HOME HEALTH SERVICES  Per UR Regulation:  Reviewed for med. necessity/level of care/duration of stay  If discussed at Long Length of Stay Meetings, dates discussed:    Comments:  09-23-12 Lorenda Ishihara RN CM 1300 Spoke with patient at bedside. States has used [facility] in the past for [facility] services. Would like to use them again. Per patient will d/c home with husband, one of her daughters plans to stay with them to assist with patient's care.

## 2012-09-25 NOTE — Progress Notes (Signed)
6 Days Post-Op  Subjective: Having some nausea, unable to take solid foods, thinks it may be the pain med.  She had not ask for Anaspaz, and it's ordered PRN.  She has stool coming from the ostomy and appears to be working well.  Objective: Vital signs in last 24 hours: Temp:  [97.9 F (36.6 C)-98.6 F (37 C)] 97.9 F (36.6 C) (02/20 0556) Pulse Rate:  [76-85] 76 (02/20 0556) Resp:  [16-18] 18 (02/20 0556) BP: (111-137)/(53-69) 111/69 mmHg (02/20 0556) SpO2:  [96 %-97 %] 96 % (02/20 0556) Last BM Date: 09/22/12 Afebrile, VSS, no labs, Intake/Output from previous day: 02/19 0701 - 02/20 0700 In: 1153.3 [P.O.:600; I.V.:553.3] Out: 2371 [Urine:2300; Drains:51; Stool:20] Intake/Output this shift:    General appearance: alert, cooperative and no distress GI: soft, complains of some nausea, no distension, wound looks good.  Lab Results:   Recent Labs  09/23/12 0421 09/24/12 0410  WBC 12.4* 9.4  HGB 11.0* 10.4*  HCT 32.3* 31.5*  PLT 292 324    BMET  Recent Labs  09/23/12 0421 09/24/12 0410  NA 139 141  K 3.8 4.0  CL 104 105  CO2 29 30  GLUCOSE 105* 118*  BUN 5* 8  CREATININE 0.57 0.68  CALCIUM 7.9* 8.1*   PT/INR No results found for this basename: LABPROT, INR,  in the last 72 hours  No results found for this basename: AST, ALT, ALKPHOS, BILITOT, PROT, ALBUMIN,  in the last 168 hours   Lipase     Component Value Date/Time   LIPASE 11 09/17/2012 0518     Studies/Results: No results found.  Medications: . chlorhexidine  15 mL Mouth Rinse BID  . enoxaparin (LOVENOX) injection  40 mg Subcutaneous Q24H  . fluticasone  1 spray Each Nare QHS  . insulin aspart  0-9 Units Subcutaneous Q4H  . pantoprazole (PROTONIX) IV  40 mg Intravenous Q24H   Prior to Admission medications   Medication Sig Start Date End Date Taking? Authorizing Provider  acidophilus (RISAQUAD) CAPS Take 1 capsule by mouth every evening.   Yes Historical Provider, MD  ALPRAZolam (XANAX)  0.25 MG tablet Take 0.125-0.25 mg by mouth daily as needed for sleep or anxiety.   Yes Historical Provider, MD  amLODipine (NORVASC) 5 MG tablet Take 5 mg by mouth every morning.   Yes Historical Provider, MD  Ascorbic Acid (VITAMIN C) 1000 MG tablet Take 1,000 mg by mouth every morning.   Yes Historical Provider, MD  aspirin EC 81 MG tablet Take 81 mg by mouth every morning.   Yes Historical Provider, MD  calcium-vitamin D (OSCAL WITH D) 500-200 MG-UNIT per tablet Take 1 tablet by mouth 2 (two) times daily.   Yes Historical Provider, MD  cholecalciferol (VITAMIN D) 1000 UNITS tablet Take 2,000 Units by mouth every morning.   Yes Historical Provider, MD  hydrOXYzine (ATARAX/VISTARIL) 25 MG tablet Take 25 mg by mouth at bedtime.   Yes Historical Provider, MD  Multiple Vitamin (MULTIVITAMIN WITH MINERALS) TABS Take 1 tablet by mouth every morning.   Yes Historical Provider, MD  pyridoxine (B-6) 200 MG tablet Take 200 mg by mouth every morning.   Yes Historical Provider, MD  simvastatin (ZOCOR) 20 MG tablet Take 20 mg by mouth every evening.   Yes Historical Provider, MD     Assessment/Plan Perforated sigmoid diverticulitis versus large pelvic abscess and peritonitis. Exploratory laparotomy, sigmoid colectomy with end colostomy. 09/20/12, Dr. Luisa Hart.  Hypertension  Hyperlipemia  Hx of recent skin Bx  for cancer.  Recent tendonitis with steroid injection.   Plan;  Stop percocet try vicodin and tramadol for pain, recheck labs in AM.  Let her work with colostomy more.  Hopefully home tomorrow.  She has had her glucose checked, her father was diabetic, but she has never been on treatment.  HBA1C 6.5  Recheck labs in AM  LOS: 11 days    Alice Anthony 09/25/2012

## 2012-09-26 LAB — COMPREHENSIVE METABOLIC PANEL
ALT: 18 U/L (ref 0–35)
AST: 20 U/L (ref 0–37)
Albumin: 2.2 g/dL — ABNORMAL LOW (ref 3.5–5.2)
Alkaline Phosphatase: 86 U/L (ref 39–117)
BUN: 7 mg/dL (ref 6–23)
CO2: 28 mEq/L (ref 19–32)
Calcium: 8 mg/dL — ABNORMAL LOW (ref 8.4–10.5)
Chloride: 102 mEq/L (ref 96–112)
Creatinine, Ser: 0.58 mg/dL (ref 0.50–1.10)
GFR calc Af Amer: >90 mL/min (ref >90)
GFR calc non Af Amer: >90 mL/min (ref >90)
Glucose, Bld: 115 mg/dL — ABNORMAL HIGH (ref 70–99)
Potassium: 4 mEq/L (ref 3.5–5.1)
Sodium: 136 mEq/L (ref 135–145)
Total Bilirubin: 0.3 mg/dL (ref 0.3–1.2)
Total Protein: 5.1 g/dL — ABNORMAL LOW (ref 6.0–8.3)

## 2012-09-26 LAB — GLUCOSE, CAPILLARY
Glucose-Capillary: 90 mg/dL (ref 70–99)
Glucose-Capillary: 124 mg/dL — ABNORMAL HIGH (ref 70–99)
Glucose-Capillary: 107 mg/dL — ABNORMAL HIGH (ref 70–99)

## 2012-09-26 LAB — CBC
HCT: 32 % — ABNORMAL LOW (ref 36.0–46.0)
Hemoglobin: 10.7 g/dL — ABNORMAL LOW (ref 12.0–15.0)
MCV: 95 fL (ref 78.0–100.0)
WBC: 10.2 10*3/uL (ref 4.0–10.5)

## 2012-09-26 MED ORDER — HYOSCYAMINE SULFATE 0.125 MG SL SUBL: 0.1250 mg | SUBLINGUAL_TABLET | Freq: Three times a day (TID) | SUBLINGUAL | Status: DC | PRN

## 2012-09-26 MED ORDER — ADULT MULTIVITAMIN W/MINERALS CH: ORAL_TABLET | ORAL | Status: DC

## 2012-09-26 MED ORDER — ACETAMINOPHEN 325 MG PO TABS: ORAL_TABLET | ORAL | Status: DC

## 2012-09-26 MED ORDER — ALPRAZOLAM 0.25 MG PO TABS: 0.2500 mg | ORAL_TABLET | Freq: Three times a day (TID) | ORAL | Status: DC | PRN

## 2012-09-26 MED ORDER — TRAMADOL HCL 50 MG PO TABS: 50.0000 mg | ORAL_TABLET | Freq: Four times a day (QID) | ORAL | Status: DC | PRN

## 2012-09-26 MED ORDER — HYDROCODONE-ACETAMINOPHEN 5-325 MG PO TABS: ORAL_TABLET | ORAL | Status: DC

## 2012-09-26 MED ORDER — CALCIUM CARBONATE-VITAMIN D 500-200 MG-UNIT PO TABS: ORAL_TABLET | ORAL | Status: DC

## 2012-09-26 NOTE — Progress Notes (Signed)
ATTENDING ADDENDUM:  I personally reviewed patient's record, examined the patient, and formulated the following assessment and plan:  Feels much better today.  Working on learning her ostomy today.  I think she should be ready for d/c tomorrow.  She is going to go home with her daughter.

## 2012-09-26 NOTE — Discharge Summary (Signed)
Physician Discharge Summary  Patient ID: Alice Anthony MRN: [id number] DOB/AGE: [birth year] 72 y.o. [name], MD   Admit date: 09/14/2012 Discharge date: 09/26/2012  Admission Diagnoses: Perforated sigmoid diverticulitis versus large pelvic abscess and peritonitis. Exploratory laparotomy, sigmoid colectomy with end colostomy. 09/20/12, Dr. Luisa Hart.  Hypertension  Hyperlipemia  Hx of recent skin Bx for cancer.  Recent tendonitis with steroid injection.   Discharge Diagnoses: Perforated sigmoid diverticulitis versus large pelvic abscess and peritonitis. Exploratory laparotomy, sigmoid colectomy with end colostomy. 09/20/12, Dr. Luisa Hart.  Hypertension  Hyperlipemia  Hx of recent skin Bx for cancer.  Recent tendonitis with steroid injection.  Active Problems:   Nausea and vomiting   Abdominal pain   Colon wall thickening   Abnormal CT scan, pelvis   Hypertension   Acute diverticulitis   Perforated bowel   Intra-abdominal abscess   Hyperkalemia   PROCEDURES: EXPLORATORY LAPAROTOMY, sigmoid colectomy, 09/19/2012, Maisie Fus A. Cornett, MD  Hospital Course: Alice Anthony is a 72 y.o. female with past medical history of hypertension and diverticulosis. Patient came to the hospital with abdominal pain. Patient said she was in her usual state of health till this morning, when she started to have some cramping in her lower abdomen. She starts to have nausea and she vomited more than 5 times. Patient has regular bowel movements in the morning, after that started to be mushy, she also had low-grade fever documented in the ED with temperature of 99.8. Patient has known diverticulosis, she said she had a bout of diverticulitis before which was treated at home. Initial evaluation in the emergency department with CT scan showed collection of fluids in the pelvis, thickening of the colon and questionable partial small bowel obstruction. Patient admitted to the hospital for further evaluation. She  was admitted for evaluation by Medicine on 09/15/12. She was treated medically but did not show much improvement. She was seen by GI DR. Edwards. A plain film take on 09/19/12 revealed what looked like free air. A CT confirmed microperforation and we saw her.  She was taken to the OR on 09/19/12 with the above procedure.  She's made slow progress, and she is doing well now.  She has some problems with post op nausea and anxiety.  By the Am of 7/22 she should be ready for d/c.  Continue home OT,PT, and HHN to help ostomy and wet to dry dressing changes. She will follow up with Dr. Luisa Hart in 2 weeks sooner if she has a problem   Condition on D/C:  Improved    Disposition: Final discharge disposition not confirmed     Medication List    TAKE these medications       acetaminophen 325 MG tablet  Commonly known as:  TYLENOL  Do not take more than 4000 mg of tylenol (acetaminphen) in any 24 hour period.     acidophilus Caps  Take 1 capsule by mouth every evening.     ALPRAZolam 0.25 MG tablet  Commonly known as:  XANAX  Take 1 tablet (0.25 mg total) by mouth 3 (three) times daily as needed for anxiety.     ALPRAZolam 0.25 MG tablet  Commonly known as:  XANAX  Take 0.125-0.25 mg by mouth daily as needed for sleep or anxiety.     amLODipine 5 MG tablet  Commonly known as:  NORVASC  Take 5 mg by mouth every morning.     aspirin EC 81 MG tablet  Take 81 mg by mouth every morning.  calcium-vitamin D 500-200 MG-UNIT per tablet  Commonly known as:  OSCAL WITH D  You can restart this in a week or so when your eating normally and feel better.     cholecalciferol 1000 UNITS tablet  Commonly known as:  VITAMIN D  Take 2,000 Units by mouth every morning.     HYDROcodone-acetaminophen 5-325 MG per tablet  Commonly known as:  NORCO/VICODIN  You can use this if you like.  Especially if the Tramadol does not work     hydrOXYzine 25 MG tablet  Commonly known as:  ATARAX/VISTARIL  Take 25  mg by mouth at bedtime.     hyoscyamine 0.125 MG SL tablet  Commonly known as:  LEVSIN SL  Place 1 tablet (0.125 mg total) under the tongue every 8 (eight) hours as needed.     multivitamin with minerals Tabs  Start this next week when your feeling and eating better.     pyridoxine 200 MG tablet  Commonly known as:  B-6  Take 200 mg by mouth every morning.     simvastatin 20 MG tablet  Commonly known as:  ZOCOR  Take 20 mg by mouth every evening.     traMADol 50 MG tablet  Commonly known as:  ULTRAM  Take 1-2 tablets (50-100 mg total) by mouth every 6 (six) hours as needed.     vitamin C 1000 MG tablet  Take 1,000 mg by mouth every morning.       Follow-up Information   Schedule an appointment as soon as possible for a visit with Dortha Schwalbe., MD.   Contact information:   [address] [place] [address] [phone]       Schedule an appointment as soon as possible for a visit with [name], MD. (7-10 days so she can look you over and make sure medical issue are taken care of.)    Contact information:   [address] [place] [address] [phone]       Signed: Sherrie George 09/26/2012, 3:24 PM

## 2012-09-26 NOTE — Progress Notes (Signed)
Dressing to abdomen changed. Explanation of dressing change with teachback. Wound clean, beefy red. No odor. Patient eager to learn.

## 2012-09-26 NOTE — Progress Notes (Signed)
ATTENDING ADDENDUM:  I personally reviewed patient's record, examined the patient, and formulated the following assessment and plan:  Feeling much better today.  Ultram works well for her.  Will plan on d/c in AM

## 2012-09-26 NOTE — Progress Notes (Signed)
Colostomy emptied by patient into toilet, observed by RN

## 2012-09-26 NOTE — Progress Notes (Addendum)
7 Days Post-Op  Subjective: Feeling better eating some today, pain issue is better with tramadol.  Objective: Vital signs in last 24 hours: Temp:  [98 F (36.7 C)-98.6 F (37 C)] 98.6 F (37 C) (02/21 0556) Pulse Rate:  [77-85] 82 (02/21 0556) Resp:  [18] 18 (02/21 0556) BP: (115-135)/(70-82) 130/82 mmHg (02/21 0556) SpO2:  [92 %-94 %] 93 % (02/21 0556) Last BM Date: 09/26/12  Intake/Output from previous day: 02/20 0701 - 02/21 0700 In: 1323.3 [P.O.:840; I.V.:483.3] Out: 1000 [Urine:400; Stool:600] Intake/Output this shift:    General appearance: alert, cooperative and no distress Resp: clear to auscultation bilaterally GI: soft tender, but better, colostomy working well.  her open wound looks great.  Lab Results:   Recent Labs  09/24/12 0410 09/26/12 0406  WBC 9.4 10.2  HGB 10.4* 10.7*  HCT 31.5* 32.0*  PLT 324 339    BMET  Recent Labs  09/24/12 0410 09/26/12 0406  NA 141 136  K 4.0 4.0  CL 105 102  CO2 30 28  GLUCOSE 118* 115*  BUN 8 7  CREATININE 0.68 0.58  CALCIUM 8.1* 8.0*   PT/INR No results found for this basename: LABPROT, INR,  in the last 72 hours   Recent Labs Lab 09/26/12 0406  AST 20  ALT 18  ALKPHOS 86  BILITOT 0.3  PROT 5.1*  ALBUMIN 2.2*     Lipase     Component Value Date/Time   LIPASE 11 09/17/2012 0518     Studies/Results: No results found.  Medications: . chlorhexidine  15 mL Mouth Rinse BID  . enoxaparin (LOVENOX) injection  40 mg Subcutaneous Q24H  . fluticasone  1 spray Each Nare QHS    Assessment/Plan Perforated sigmoid diverticulitis versus large pelvic abscess and peritonitis. Exploratory laparotomy, sigmoid colectomy with end colostomy. 09/20/12, Dr. Luisa Hart.  Hypertension  Hyperlipemia  Hx of recent skin Bx for cancer.  Recent tendonitis with steroid injection. Anxiety related to family concerns.   Plan:  Teach her how to do dressing changes, home health has been requested .  Plan DC in Am with  family to home. I have done a d/c summary, she should just need to be discharged in the AM.    LOS: 12 days    Kamdin Follett 09/26/2012

## 2012-09-26 NOTE — Progress Notes (Signed)
Occupational Therapy Treatment Patient Details Name: Alice Anthony MRN: [id number] DOB: 1941/04/06 Today's Date: 09/26/2012 Time: 4098-1191 OT Time Calculation (min): 31 min  OT Assessment / Plan / Recommendation Comments on Treatment Session Pt in better spirits today and planning for DC tomorrow                Frequency Min 2X/week   Plan Discharge plan remains appropriate    Precautions / Restrictions Precautions Precautions: Fall Restrictions Weight Bearing Restrictions: No       ADL  Transfers/Ambulation Related to ADLs: Pt sitting in chair.  OT and pt had lengthy discussion regarding DC home and identifying ADL activity in which pt will need A with.  Discussed and Educated pt on energy conservation strategies, as well as importance of  balancing rest and home activities. ADL Comments: Pt able to verbalize energy conservation strateigies and how to implement at home.        OT Goals Miscellaneous OT Goals Miscellaneous OT Goal #1: Pt will verbalize and demonstrate energy conservation strategies with ADL activity in order to balance pain and overall I with ADL activity  Visit Information  Last OT Received On: 09/26/12    Subjective Data  Subjective: I am feeling better today- I am thinking though how to do things at home      Cognition  Cognition Overall Cognitive Status: Appears within functional limits for tasks assessed/performed Arousal/Alertness: Awake/alert Orientation Level: Appears intact for tasks assessed Behavior During Session: [facility] for tasks performed             End of Session OT - End of Session Activity Tolerance: Patient tolerated treatment well Patient left: in chair;with call bell/phone within reach  GO     Candie Gintz, [place] Kung 09/26/2012, 11:49 AM

## 2012-09-26 NOTE — Progress Notes (Signed)
NUTRITION FOLLOW UP  Intervention:   No intervention needed at this time  Nutrition Dx:   Inadequate oral intake related to clear liquid diet as evidenced by diet order, Resolved   Goal:   Advance diet as tolerated to low fiber diet, Met  New Goal: Meet >/=90% estimated nutrition needs  Monitor:   Tolerance of low fiber diet, weight trends, labs, colostomy output  Assessment:   Per PA note, pt with several family issues including daughter and husband both needing surgery and husband unwilling to do anything for himself post CABG surgery and pt seems very anxious. This may be of concern related to her diet concerning her new colostomy. Pt to d/c to home with Home Health.  Pt states that her appetite is good and that she is tolerating the low fiber diet well. No meal completion recorded since diet advancement. She stated that she has not had any problems with foods up to this point. When asked if she has any further questions she indicated that she did not at this time. Informed her to please let the nurse know if she has any nutrition-related questions in the future.   Height: Ht Readings from Last 1 Encounters:  09/19/12 5\' 2"  (1.575 m)    Weight Status:   Wt Readings from Last 1 Encounters:  09/20/12 147 lb 14.9 oz (67.1 kg)    Estimated needs:  Kcal: 1400-1700 Protein: 80-95 grams Fluid: 1.4-1.7 L/day  Skin: Abdominal incision from exploratory laparotomy, sigmoid colectomy with end colostomy  Diet Order: Fiber Restricted   Intake/Output Summary (Last 24 hours) at 09/26/12 1354 Last data filed at 09/26/12 1000  Gross per 24 hour  Intake   1320 ml  Output   1000 ml  Net    320 ml    Last BM: 2/19   Labs:   Recent Labs Lab 09/23/12 0421 09/24/12 0410 09/26/12 0406  NA 139 141 136  K 3.8 4.0 4.0  CL 104 105 102  CO2 29 30 28   BUN 5* 8 7  CREATININE 0.57 0.68 0.58  CALCIUM 7.9* 8.1* 8.0*  GLUCOSE 105* 118* 115*    CBG (last 3)   Recent Labs  09/25/12 0724 09/26/12 0733 09/26/12 1130  GLUCAP 110* 90 124*    Scheduled Meds: . chlorhexidine  15 mL Mouth Rinse BID  . enoxaparin (LOVENOX) injection  40 mg Subcutaneous Q24H  . fluticasone  1 spray Each Nare QHS    Continuous Infusions: . 0.9 % NaCl with KCl 20 mEq / L 20 mL/hr at 09/25/12 1659    [facility] Dietetic Intern # [phone]

## 2012-09-26 NOTE — Progress Notes (Signed)
Physical Therapy Treatment Patient Details Name: Alice Anthony MRN: [id number] DOB: 12/03/1940 Today's Date: 09/26/2012 Time: 1016-1040 PT Time Calculation (min): 24 min  PT Assessment / Plan / Recommendation Comments on Treatment Session  Pt amb with use of IV pole.  Good alternating gait with no unsteadyness.  Assisted with static standing at sink to wash pt's hair.  Instructed on ABD B LE's to increase her BOS and backward gait safety. Pt plans to D/C to home.    Follow Up Recommendations  Home health PT     Does the patient have the potential to tolerate intense rehabilitation     Barriers to Discharge        Equipment Recommendations  None recommended by PT    Recommendations for Other Services    Frequency Min 3X/week   Plan Discharge plan remains appropriate    Precautions / Restrictions Precautions Precautions: Fall Precaution Comments: colostomy and JP drain Restrictions Weight Bearing Restrictions: No   Pertinent Vitals/Pain C/o ABD "tightness"    Mobility  Bed Mobility Bed Mobility: Sit to Supine Supine to Sit: 5: Supervision Details for Bed Mobility Assistance: increased time and use of rails with HOB elevated 45' Transfers Transfers: Sit to Stand;Stand to Sit Sit to Stand: 4: Min guard;5: Supervision;From bed Stand to Sit: 5: Supervision;4: Min guard;To chair/3-in-1 Details for Transfer Assistance: good use of hands and increased time Ambulation/Gait Ambulation/Gait Assistance: 4: Min guard Ambulation Distance (Feet): 185 Feet Assistive device: None Ambulation/Gait Assistance Details: good alternating gait with no LOB.  Mils discomfort ABD "tightness".  Static standing @ sink x . Gait Pattern: Step-through pattern;Trunk flexed;Decreased stride length Gait velocity: slightly decreased Stairs: No    PT Goals                                            progressing    Visit Information  Last PT Received On: 09/26/12 Assistance Needed: +1     Subjective Data  Subjective: I feel better   Cognition  Cognition Overall Cognitive Status: Appears within functional limits for tasks assessed/performed Arousal/Alertness: Awake/alert Orientation Level: Appears intact for tasks assessed Behavior During Session: [facility] for tasks performed    Balance   good  End of Session PT - End of Session Equipment Utilized During Treatment: Gait belt Activity Tolerance: Patient tolerated treatment well Patient left: in chair;with bed alarm set   Felecia Shelling  PTA [facility]  Acute  Rehab Pager      [phone]

## 2012-09-27 DIAGNOSIS — G8928 Other chronic postprocedural pain: Secondary | ICD-10-CM | POA: Diagnosis not present

## 2012-09-27 DIAGNOSIS — Z4801 Encounter for change or removal of surgical wound dressing: Secondary | ICD-10-CM | POA: Diagnosis not present

## 2012-09-27 DIAGNOSIS — R5381 Other malaise: Secondary | ICD-10-CM | POA: Diagnosis not present

## 2012-09-27 DIAGNOSIS — E669 Obesity, unspecified: Secondary | ICD-10-CM | POA: Diagnosis not present

## 2012-09-27 DIAGNOSIS — Z433 Encounter for attention to colostomy: Secondary | ICD-10-CM | POA: Diagnosis not present

## 2012-09-27 LAB — GLUCOSE, CAPILLARY
Glucose-Capillary: 104 mg/dL — ABNORMAL HIGH (ref 70–99)
Glucose-Capillary: 169 mg/dL — ABNORMAL HIGH (ref 70–99)

## 2012-09-27 NOTE — Progress Notes (Signed)
8 Days Post-Op  Subjective: Doing okay.  Ready to go home.  Objective: Vital signs in last 24 hours: Temp:  [98.5 F (36.9 C)-98.8 F (37.1 C)] 98.5 F (36.9 C) (02/22 0600) Pulse Rate:  [78] 78 (02/22 0600) Resp:  [18] 18 (02/22 0600) BP: (115-132)/(68-77) 115/77 mmHg (02/22 0600) SpO2:  [96 %] 96 % (02/22 0600) Last BM Date: 09/26/12  Intake/Output from previous day: 02/21 0701 - 02/22 0700 In: 1357.7 [P.O.:960; I.V.:397.7] Out: 101 [Stool:101] Intake/Output this shift: Total I/O In: 240 [P.O.:240] Out: -   PE: General- In NAD Abdomen-soft, LLQ colostomy pink and functioning,midline wound open with good granulation tissue  Lab Results:   Recent Labs  09/26/12 0406  WBC 10.2  HGB 10.7*  HCT 32.0*  PLT 339   BMET  Recent Labs  09/26/12 0406  NA 136  K 4.0  CL 102  CO2 28  GLUCOSE 115*  BUN 7  CREATININE 0.58  CALCIUM 8.0*   PT/INR No results found for this basename: LABPROT, INR,  in the last 72 hours Comprehensive Metabolic Panel:    Component Value Date/Time   NA 136 09/26/2012 0406   K 4.0 09/26/2012 0406   CL 102 09/26/2012 0406   CO2 28 09/26/2012 0406   BUN 7 09/26/2012 0406   CREATININE 0.58 09/26/2012 0406   GLUCOSE 115* 09/26/2012 0406   CALCIUM 8.0* 09/26/2012 0406   AST 20 09/26/2012 0406   ALT 18 09/26/2012 0406   ALKPHOS 86 09/26/2012 0406   BILITOT 0.3 09/26/2012 0406   PROT 5.1* 09/26/2012 0406   ALBUMIN 2.2* 09/26/2012 0406     Studies/Results: No results found.  Anti-infectives: Anti-infectives   Start     Dose/Rate Route Frequency Ordered Stop   09/22/12 2100  imipenem-cilastatin (PRIMAXIN) 500 mg in sodium chloride 0.9 % 100 mL IVPB  Status:  Discontinued     500 mg 200 mL/hr over 30 Minutes Intravenous Every 8 hours 09/22/12 1255 09/23/12 1344   09/18/12 1200  imipenem-cilastatin (PRIMAXIN) 250 mg in sodium chloride 0.9 % 100 mL IVPB  Status:  Discontinued     250 mg 200 mL/hr over 30 Minutes Intravenous 4 times per day  09/18/12 1111 09/22/12 1254   09/16/12 1500  imipenem-cilastatin (PRIMAXIN) 500 mg in sodium chloride 0.9 % 100 mL IVPB  Status:  Discontinued     500 mg 200 mL/hr over 30 Minutes Intravenous 3 times per day 09/16/12 1449 09/18/12 1111   09/14/12 2200  ciprofloxacin (CIPRO) IVPB 400 mg  Status:  Discontinued     400 mg 200 mL/hr over 60 Minutes Intravenous 2 times daily 09/14/12 2120 09/16/12 1415   09/14/12 2200  metroNIDAZOLE (FLAGYL) IVPB 500 mg  Status:  Discontinued     500 mg 100 mL/hr over 60 Minutes Intravenous 3 times per day 09/14/12 2120 09/16/12 1415      Assessment Active Problems:  Perforated sigmoid diverticulitis versus large pelvic abscess and peritonitis. Exploratory laparotomy, sigmoid colectomy with end colostomy. 09/20/12-colostomy working, wound healing by secondary intention.    LOS: 13 days   Plan: Discharge today.   Alice Anthony 09/27/2012

## 2012-09-27 NOTE — Care Management (Signed)
CM confirmed HH orders upon discharge with [facility]. Alice Anes, MD orders, DC summary faxed to [facility] at [phone]. Confirmation received. RN Alice Anthony to change dressing to prior to pt's discharge. No further needs stated.   Alice Manns Jovann Luse,RN,BSN [phone]

## 2012-09-27 NOTE — Discharge Summary (Signed)
Agree with summary. 

## 2012-09-28 DIAGNOSIS — E669 Obesity, unspecified: Secondary | ICD-10-CM | POA: Diagnosis not present

## 2012-09-28 DIAGNOSIS — Z4801 Encounter for change or removal of surgical wound dressing: Secondary | ICD-10-CM | POA: Diagnosis not present

## 2012-09-28 DIAGNOSIS — G8928 Other chronic postprocedural pain: Secondary | ICD-10-CM | POA: Diagnosis not present

## 2012-09-28 DIAGNOSIS — R5381 Other malaise: Secondary | ICD-10-CM | POA: Diagnosis not present

## 2012-09-28 DIAGNOSIS — Z433 Encounter for attention to colostomy: Secondary | ICD-10-CM | POA: Diagnosis not present

## 2012-09-29 DIAGNOSIS — G8928 Other chronic postprocedural pain: Secondary | ICD-10-CM | POA: Diagnosis not present

## 2012-09-29 DIAGNOSIS — Z4801 Encounter for change or removal of surgical wound dressing: Secondary | ICD-10-CM | POA: Diagnosis not present

## 2012-09-29 DIAGNOSIS — E669 Obesity, unspecified: Secondary | ICD-10-CM | POA: Diagnosis not present

## 2012-09-29 DIAGNOSIS — Z433 Encounter for attention to colostomy: Secondary | ICD-10-CM | POA: Diagnosis not present

## 2012-09-29 DIAGNOSIS — R5383 Other fatigue: Secondary | ICD-10-CM | POA: Diagnosis not present

## 2012-09-30 DIAGNOSIS — Z4801 Encounter for change or removal of surgical wound dressing: Secondary | ICD-10-CM | POA: Diagnosis not present

## 2012-09-30 DIAGNOSIS — R5383 Other fatigue: Secondary | ICD-10-CM | POA: Diagnosis not present

## 2012-09-30 DIAGNOSIS — Z433 Encounter for attention to colostomy: Secondary | ICD-10-CM | POA: Diagnosis not present

## 2012-09-30 DIAGNOSIS — G8928 Other chronic postprocedural pain: Secondary | ICD-10-CM | POA: Diagnosis not present

## 2012-09-30 DIAGNOSIS — E669 Obesity, unspecified: Secondary | ICD-10-CM | POA: Diagnosis not present

## 2012-10-02 DIAGNOSIS — R110 Nausea: Secondary | ICD-10-CM | POA: Diagnosis not present

## 2012-10-02 DIAGNOSIS — E669 Obesity, unspecified: Secondary | ICD-10-CM | POA: Diagnosis not present

## 2012-10-02 DIAGNOSIS — Z433 Encounter for attention to colostomy: Secondary | ICD-10-CM | POA: Diagnosis not present

## 2012-10-02 DIAGNOSIS — R350 Frequency of micturition: Secondary | ICD-10-CM | POA: Diagnosis not present

## 2012-10-02 DIAGNOSIS — Z79899 Other long term (current) drug therapy: Secondary | ICD-10-CM | POA: Diagnosis not present

## 2012-10-02 DIAGNOSIS — G8928 Other chronic postprocedural pain: Secondary | ICD-10-CM | POA: Diagnosis not present

## 2012-10-02 DIAGNOSIS — Z4801 Encounter for change or removal of surgical wound dressing: Secondary | ICD-10-CM | POA: Diagnosis not present

## 2012-10-02 DIAGNOSIS — R5381 Other malaise: Secondary | ICD-10-CM | POA: Diagnosis not present

## 2012-10-02 DIAGNOSIS — K5732 Diverticulitis of large intestine without perforation or abscess without bleeding: Secondary | ICD-10-CM | POA: Diagnosis not present

## 2012-10-03 DIAGNOSIS — E669 Obesity, unspecified: Secondary | ICD-10-CM | POA: Diagnosis not present

## 2012-10-03 DIAGNOSIS — G8928 Other chronic postprocedural pain: Secondary | ICD-10-CM | POA: Diagnosis not present

## 2012-10-03 DIAGNOSIS — R5381 Other malaise: Secondary | ICD-10-CM | POA: Diagnosis not present

## 2012-10-03 DIAGNOSIS — Z4801 Encounter for change or removal of surgical wound dressing: Secondary | ICD-10-CM | POA: Diagnosis not present

## 2012-10-03 DIAGNOSIS — Z433 Encounter for attention to colostomy: Secondary | ICD-10-CM | POA: Diagnosis not present

## 2012-10-05 DIAGNOSIS — Z4801 Encounter for change or removal of surgical wound dressing: Secondary | ICD-10-CM | POA: Diagnosis not present

## 2012-10-05 DIAGNOSIS — E669 Obesity, unspecified: Secondary | ICD-10-CM | POA: Diagnosis not present

## 2012-10-05 DIAGNOSIS — R5381 Other malaise: Secondary | ICD-10-CM | POA: Diagnosis not present

## 2012-10-05 DIAGNOSIS — Z433 Encounter for attention to colostomy: Secondary | ICD-10-CM | POA: Diagnosis not present

## 2012-10-05 DIAGNOSIS — G8928 Other chronic postprocedural pain: Secondary | ICD-10-CM | POA: Diagnosis not present

## 2012-10-06 DIAGNOSIS — Z433 Encounter for attention to colostomy: Secondary | ICD-10-CM | POA: Diagnosis not present

## 2012-10-06 DIAGNOSIS — Z4801 Encounter for change or removal of surgical wound dressing: Secondary | ICD-10-CM | POA: Diagnosis not present

## 2012-10-06 DIAGNOSIS — R5383 Other fatigue: Secondary | ICD-10-CM | POA: Diagnosis not present

## 2012-10-06 DIAGNOSIS — G8928 Other chronic postprocedural pain: Secondary | ICD-10-CM | POA: Diagnosis not present

## 2012-10-06 DIAGNOSIS — E669 Obesity, unspecified: Secondary | ICD-10-CM | POA: Diagnosis not present

## 2012-10-07 ENCOUNTER — Encounter (INDEPENDENT_AMBULATORY_CARE_PROVIDER_SITE_OTHER): Payer: Self-pay | Admitting: Surgery

## 2012-10-07 ENCOUNTER — Ambulatory Visit (INDEPENDENT_AMBULATORY_CARE_PROVIDER_SITE_OTHER): Payer: Medicare Other | Admitting: Surgery

## 2012-10-07 VITALS — BP 110/70 | HR 98 | Temp 97.6°F | Resp 20 | Ht 62.0 in | Wt 130.6 lb

## 2012-10-07 DIAGNOSIS — Z9889 Other specified postprocedural states: Secondary | ICD-10-CM

## 2012-10-07 MED ORDER — PROMETHAZINE HCL 12.5 MG PO TABS: 12.5000 mg | ORAL_TABLET | Freq: Four times a day (QID) | ORAL | Status: DC | PRN

## 2012-10-07 NOTE — Progress Notes (Signed)
Pt returns after sigmoid colectomy and colostomy for diverticulitis.  She has had some issues with her bag and wound.  Still has some nausea but ostomy working well.    Exam shows open wound clean.  Ostomy functioning well.      S/p sigmoid colectomy and colostomy for diverticulitis  Return 3 weeks.

## 2012-10-07 NOTE — Patient Instructions (Signed)
Continue wound care.  Return 3 weeks.

## 2012-10-08 DIAGNOSIS — Z4801 Encounter for change or removal of surgical wound dressing: Secondary | ICD-10-CM | POA: Diagnosis not present

## 2012-10-08 DIAGNOSIS — G8928 Other chronic postprocedural pain: Secondary | ICD-10-CM | POA: Diagnosis not present

## 2012-10-08 DIAGNOSIS — E669 Obesity, unspecified: Secondary | ICD-10-CM | POA: Diagnosis not present

## 2012-10-08 DIAGNOSIS — R5383 Other fatigue: Secondary | ICD-10-CM | POA: Diagnosis not present

## 2012-10-08 DIAGNOSIS — Z433 Encounter for attention to colostomy: Secondary | ICD-10-CM | POA: Diagnosis not present

## 2012-10-11 DIAGNOSIS — R5383 Other fatigue: Secondary | ICD-10-CM | POA: Diagnosis not present

## 2012-10-11 DIAGNOSIS — Z4801 Encounter for change or removal of surgical wound dressing: Secondary | ICD-10-CM | POA: Diagnosis not present

## 2012-10-11 DIAGNOSIS — E669 Obesity, unspecified: Secondary | ICD-10-CM | POA: Diagnosis not present

## 2012-10-11 DIAGNOSIS — Z433 Encounter for attention to colostomy: Secondary | ICD-10-CM | POA: Diagnosis not present

## 2012-10-11 DIAGNOSIS — G8928 Other chronic postprocedural pain: Secondary | ICD-10-CM | POA: Diagnosis not present

## 2012-10-14 DIAGNOSIS — Z433 Encounter for attention to colostomy: Secondary | ICD-10-CM | POA: Diagnosis not present

## 2012-10-14 DIAGNOSIS — Z4801 Encounter for change or removal of surgical wound dressing: Secondary | ICD-10-CM | POA: Diagnosis not present

## 2012-10-14 DIAGNOSIS — R5381 Other malaise: Secondary | ICD-10-CM | POA: Diagnosis not present

## 2012-10-14 DIAGNOSIS — G8928 Other chronic postprocedural pain: Secondary | ICD-10-CM | POA: Diagnosis not present

## 2012-10-14 DIAGNOSIS — E669 Obesity, unspecified: Secondary | ICD-10-CM | POA: Diagnosis not present

## 2012-10-15 DIAGNOSIS — Z4801 Encounter for change or removal of surgical wound dressing: Secondary | ICD-10-CM | POA: Diagnosis not present

## 2012-10-15 DIAGNOSIS — Z433 Encounter for attention to colostomy: Secondary | ICD-10-CM | POA: Diagnosis not present

## 2012-10-15 DIAGNOSIS — G8928 Other chronic postprocedural pain: Secondary | ICD-10-CM | POA: Diagnosis not present

## 2012-10-15 DIAGNOSIS — E669 Obesity, unspecified: Secondary | ICD-10-CM | POA: Diagnosis not present

## 2012-10-15 DIAGNOSIS — R5381 Other malaise: Secondary | ICD-10-CM | POA: Diagnosis not present

## 2012-10-16 DIAGNOSIS — E669 Obesity, unspecified: Secondary | ICD-10-CM | POA: Diagnosis not present

## 2012-10-16 DIAGNOSIS — Z4801 Encounter for change or removal of surgical wound dressing: Secondary | ICD-10-CM | POA: Diagnosis not present

## 2012-10-16 DIAGNOSIS — Z433 Encounter for attention to colostomy: Secondary | ICD-10-CM | POA: Diagnosis not present

## 2012-10-16 DIAGNOSIS — R5381 Other malaise: Secondary | ICD-10-CM | POA: Diagnosis not present

## 2012-10-16 DIAGNOSIS — G8928 Other chronic postprocedural pain: Secondary | ICD-10-CM | POA: Diagnosis not present

## 2012-10-21 DIAGNOSIS — Z4801 Encounter for change or removal of surgical wound dressing: Secondary | ICD-10-CM | POA: Diagnosis not present

## 2012-10-21 DIAGNOSIS — E669 Obesity, unspecified: Secondary | ICD-10-CM | POA: Diagnosis not present

## 2012-10-21 DIAGNOSIS — Z433 Encounter for attention to colostomy: Secondary | ICD-10-CM | POA: Diagnosis not present

## 2012-10-21 DIAGNOSIS — G8928 Other chronic postprocedural pain: Secondary | ICD-10-CM | POA: Diagnosis not present

## 2012-10-21 DIAGNOSIS — R5381 Other malaise: Secondary | ICD-10-CM | POA: Diagnosis not present

## 2012-10-23 DIAGNOSIS — E669 Obesity, unspecified: Secondary | ICD-10-CM | POA: Diagnosis not present

## 2012-10-23 DIAGNOSIS — R5383 Other fatigue: Secondary | ICD-10-CM | POA: Diagnosis not present

## 2012-10-23 DIAGNOSIS — G8928 Other chronic postprocedural pain: Secondary | ICD-10-CM | POA: Diagnosis not present

## 2012-10-23 DIAGNOSIS — Z433 Encounter for attention to colostomy: Secondary | ICD-10-CM | POA: Diagnosis not present

## 2012-10-23 DIAGNOSIS — Z4801 Encounter for change or removal of surgical wound dressing: Secondary | ICD-10-CM | POA: Diagnosis not present

## 2012-10-26 DIAGNOSIS — K5732 Diverticulitis of large intestine without perforation or abscess without bleeding: Secondary | ICD-10-CM | POA: Diagnosis not present

## 2012-10-26 DIAGNOSIS — R350 Frequency of micturition: Secondary | ICD-10-CM | POA: Diagnosis not present

## 2012-10-26 DIAGNOSIS — R110 Nausea: Secondary | ICD-10-CM | POA: Diagnosis not present

## 2012-10-26 DIAGNOSIS — Z79899 Other long term (current) drug therapy: Secondary | ICD-10-CM | POA: Diagnosis not present

## 2012-10-28 ENCOUNTER — Ambulatory Visit (INDEPENDENT_AMBULATORY_CARE_PROVIDER_SITE_OTHER): Payer: Medicare Other | Admitting: Surgery

## 2012-10-28 ENCOUNTER — Encounter (INDEPENDENT_AMBULATORY_CARE_PROVIDER_SITE_OTHER): Payer: Self-pay | Admitting: Surgery

## 2012-10-28 VITALS — BP 124/70 | HR 77 | Temp 97.1°F | Resp 16 | Ht 62.0 in | Wt 133.0 lb

## 2012-10-28 DIAGNOSIS — Z9889 Other specified postprocedural states: Secondary | ICD-10-CM

## 2012-10-28 NOTE — Patient Instructions (Signed)
Return 6 weeks.  Set up BE.

## 2012-10-28 NOTE — Progress Notes (Signed)
Pt returns after sigmoid colectomy and colostomy for diverticulitis.  She has had some issues with her bag and wound.  No  nausea but ostomy working well.    Exam shows open wound clean.  Ostomy functioning well.      S/p sigmoid colectomy and colostomy for diverticulitis  Return 6 weeks. Needs BE

## 2012-11-03 ENCOUNTER — Telehealth (INDEPENDENT_AMBULATORY_CARE_PROVIDER_SITE_OTHER): Payer: Self-pay | Admitting: General Surgery

## 2012-11-03 NOTE — Telephone Encounter (Signed)
Spoke with patient  She was advise to do the so lo prep that was given to her. As directed I also gave

## 2012-11-04 ENCOUNTER — Ambulatory Visit
Admission: RE | Admit: 2012-11-04 | Discharge: 2012-11-04 | Disposition: A | Discharge status: Home / Self Care | Payer: Medicare Other | Source: Ambulatory Visit | Attending: Surgery | Admitting: Surgery

## 2012-11-04 DIAGNOSIS — K5730 Diverticulosis of large intestine without perforation or abscess without bleeding: Secondary | ICD-10-CM | POA: Diagnosis not present

## 2012-11-04 DIAGNOSIS — Z9889 Other specified postprocedural states: Secondary | ICD-10-CM

## 2012-12-08 ENCOUNTER — Encounter (INDEPENDENT_AMBULATORY_CARE_PROVIDER_SITE_OTHER): Payer: Self-pay | Admitting: Surgery

## 2012-12-08 ENCOUNTER — Ambulatory Visit (INDEPENDENT_AMBULATORY_CARE_PROVIDER_SITE_OTHER): Payer: Medicare Other | Admitting: Surgery

## 2012-12-08 VITALS — BP 124/72 | HR 76 | Resp 14 | Ht 62.0 in | Wt 134.6 lb

## 2012-12-08 DIAGNOSIS — K5732 Diverticulitis of large intestine without perforation or abscess without bleeding: Secondary | ICD-10-CM

## 2012-12-08 NOTE — Progress Notes (Signed)
Patient ID: Alice Anthony, female   DOB: [birth year], 72 y.o.   MRN: [id number]  No chief complaint on file.   HPI Alice Anthony is a 72 y.o. female.  Patient returns for followup of her diverticulitis and colostomy. She's doing well. She is ready to schedule reversal. She is two and a half  half months out from surgery. HPI  Past Medical History  Diagnosis Date  . Diverticulosis   . Hypertension   . Hyperlipemia     Past Surgical History  Procedure Laterality Date  . Tubal ligation    . Tonsillectomy    . Laparotomy N/A 09/19/2012    Procedure: EXPLORATORY LAPAROTOMY, sigmoid colectomy;  Surgeon: Clovis Pu. Tron Flythe, MD;  Location: [facility];  Service: General;  Laterality: N/A;  . Trigger finger release  12/25/2010  . Sigmoidoscopy  01/11/1999  . Colonoscopy  03/2012  . Abdominal surgery  09/19/2012    Ruptured Diverticuli, Ostomy Placed.    Family History  Problem Relation Age of Onset  . Diabetes Father   . Alzheimer's disease Father   . Basal cell carcinoma Mother     Metastatic    Social History History  Substance Use Topics  . Smoking status: Never Smoker   . Smokeless tobacco: Never Used  . Alcohol Use: No    Allergies  Allergen Reactions  . Codeine   . Fosamax (Alendronate Sodium)   . Latex   . Vioxx (Rofecoxib)     Current Outpatient Prescriptions  Medication Sig Dispense Refill  . acidophilus (RISAQUAD) CAPS Take 1 capsule by mouth every evening.      Marland Kitchen ALPRAZolam (XANAX) 0.25 MG tablet Take 0.125-0.25 mg by mouth daily as needed for sleep or anxiety.      Marland Kitchen amLODipine (NORVASC) 5 MG tablet Take 5 mg by mouth every morning.      Marland Kitchen aspirin EC 81 MG tablet Take 81 mg by mouth every morning.      . calcium-vitamin D (OSCAL WITH D) 500-200 MG-UNIT per tablet You can restart this in a week or so when your eating normally and feel better.      . cetirizine (ZYRTEC) 10 MG tablet Take 10 mg by mouth daily.      . cholecalciferol (VITAMIN D) 1000 UNITS tablet  Take 2,000 Units by mouth every morning.      . fluticasone (FLONASE) 50 MCG/ACT nasal spray       . hydrOXYzine (ATARAX/VISTARIL) 25 MG tablet Take 25 mg by mouth at bedtime.      . Multiple Vitamin (MULTIVITAMIN WITH MINERALS) TABS Start this next week when your feeling and eating better.      . pyridoxine (B-6) 200 MG tablet Take 200 mg by mouth every morning.      . simvastatin (ZOCOR) 20 MG tablet Take 20 mg by mouth every evening.       No current facility-administered medications for this visit.    Review of Systems Review of Systems  Constitutional: Negative.   HENT: Negative.   Eyes: Negative.   Respiratory: Negative.   Cardiovascular: Negative.   Gastrointestinal: Negative.   Endocrine: Negative.   Genitourinary: Negative.   Allergic/Immunologic: Negative.   Neurological: Negative.   Hematological: Negative.   Psychiatric/Behavioral: Negative.     Blood pressure 124/72, pulse 76, resp. rate 14, height 5\' 2"  (1.575 m), weight 134 lb 9.6 oz (61.054 kg).  Physical Exam Physical Exam  Constitutional: She is oriented to person, place, and time. She appears  well-developed and well-nourished.  HENT:  Head: Normocephalic and atraumatic.  Abdominal: Soft. Bowel sounds are normal. There is no tenderness. There is no rebound.    Musculoskeletal: Normal range of motion.  Neurological: She is alert and oriented to person, place, and time.  Skin: Skin is warm and dry.  Psychiatric: She has a normal mood and affect. Her behavior is normal. Judgment and thought content normal.    Data Reviewed Clinical Data: Diverticulitis, surgical resection, for  reanastomosis  SINGLE COLUMN BARIUM ENEMA  Technique: Initial scout AP supine abdominal image obtained to  insure adequate colon cleansing. Barium was introduced into the  colon in a retrograde fashion and refluxed from the rectum to the  cecum. Spot images of the colon followed by overhead radiographs  were obtained.    Fluoroscopy time: 2.2 minutes.  Comparison: CT abdomen pelvis of 09/19/2012  Findings: A preliminary film of the abdomen shows an ostomy in the  left lower quadrant. Some retained contrast is noted within the  region of the rectum.  Initially a barium enema via the rectum was performed. There are  filling defects within the rectum consistent with retained feces.  No other abnormality is seen.  Barium enema via the ostomy was then performed. There are  diverticula within the proximal descending colon and the  transverse colon. The cecum fills normally and the terminal ileum  is unremarkable. A postevacuation film shows diverticula within  the descending and transverse colon.  IMPRESSION:  1. No abnormality of the rectal stump although there is retained  feces making evaluation difficult.  2. Diverticula are noted within the transverse colon and  descending colon.  Original Report Authenticated By: Dwyane Dee, M.D.   Assessment    History of diverticulitis status post resection with colostomy 2-1/2 months ago    Plan    Closure of colostomy. Risk include bleeding, infection, anastomotic leak, further surgery with colostomy, blood clots, cardiovascular complications, wound complications, hernia, death, organ injury, and the need for other procedures. She wishes to proceed.       Azari Janssens A. 12/08/2012, 2:17 PM

## 2012-12-08 NOTE — Patient Instructions (Signed)
End Colostomy Reversal An end colostomy reversal is surgery that reverses an end colostomy. The large intestine is disconnected from the opening in the abdomen (stoma). It is then reconnected to the large intestine inside the body. A stoma and pouch are no longer needed. Bowel movements can resume through the rectum. LET YOUR CAREGIVER KNOW ABOUT:  Allergies to food or medicine.  Medicines taken, including vitamins, health supplements, herbs, eyedrops, over-the-counter medicines, and creams.  Use of steroids (by mouth or creams).  Previous problems with anesthetics or numbing medicines.  History of bleeding problems or blood clots.  Previous surgery.  Other health problems, including diabetes and kidney problems.  Possibility of pregnancy, if this applies. RISKS AND COMPLICATIONS General surgical complications may include:  Reaction to anesthesia.  Damage to surrounding nerves, tissues, or structures.  Blood clot.  Bleeding.  Scarring. Specific risks for colostomy reversal, while rare, may include:  Intestinal paralysis (ileus). This is a normal part of recovery. It usually goes away in 3 to 7 days. However, it can last longer in some people.  Leaking at the joined part of the intestine (anastamotic leak).  Infection of the surgical cut (incision) or the place where the stoma was located.  A collection of pus (abscess) in the abdomen or pelvis.  Intestinal blockage.  Narrowing at the joined part of the intestine (stricture).  Urinary and sexual dysfunction. BEFORE THE PROCEDURE It is important to follow your surgeon's instructions prior to your procedure. This will help you to avoid complications. Steps before your procedure may include:  A physical exam, rectal exam, X-rays, colonoscopy, and other procedures.  Chemotherapy or radiation therapy if the stoma was created for cancer.  A review of the procedure, the anesthesia being used, and what to expect after the  procedure. You may be asked to:  Stop taking certain medicines for several days prior to your procedure. This may include blood thinners (such as aspirin).  Take certain medicines, such as antibiotics or stool softeners.  Avoid eating and drinking after midnight the night before the procedure. This will help you to avoid complications from the anesthesia.  Quit smoking. Smoking increases the chances of a healing problem after your procedure. PROCEDURE  You will be given medicine that makes you sleep (general anesthetic). The procedure may be done as open surgery, with a large incision. It may also be done as laparoscopic surgery, with several smaller incisions. The surgeon will stitch or staple the intestine ends back together. This surgery takes several hours. AFTER THE PROCEDURE  You will be given pain medicine.  Your caregivers will slowly increase your diet and movement.  You can expect to be in the hospital for about 5 to 10 days.  You should arrange for someone to help you with activities at home while you recover. Document Released: 10/15/2011 Document Reviewed: 10/15/2011 [facility] Patient Information 2013 [place], [place].

## 2012-12-09 DIAGNOSIS — R928 Other abnormal and inconclusive findings on diagnostic imaging of breast: Secondary | ICD-10-CM | POA: Diagnosis not present

## 2012-12-09 DIAGNOSIS — L819 Disorder of pigmentation, unspecified: Secondary | ICD-10-CM | POA: Diagnosis not present

## 2012-12-09 DIAGNOSIS — Z09 Encounter for follow-up examination after completed treatment for conditions other than malignant neoplasm: Secondary | ICD-10-CM | POA: Diagnosis not present

## 2012-12-09 DIAGNOSIS — L570 Actinic keratosis: Secondary | ICD-10-CM | POA: Diagnosis not present

## 2012-12-09 DIAGNOSIS — L578 Other skin changes due to chronic exposure to nonionizing radiation: Secondary | ICD-10-CM | POA: Diagnosis not present

## 2012-12-11 ENCOUNTER — Telehealth (INDEPENDENT_AMBULATORY_CARE_PROVIDER_SITE_OTHER): Payer: Self-pay | Admitting: *Deleted

## 2012-12-11 NOTE — Telephone Encounter (Signed)
Left message for patient to call me back to give her the instructions from Cornett MD.

## 2012-12-11 NOTE — Telephone Encounter (Signed)
Patient aware and understands

## 2012-12-11 NOTE — Telephone Encounter (Signed)
Daughter called to state that they have picked up the Erythromycin however the pills are so large that the patient is unable to take them.  Is there an alternative of liquid or another medication?

## 2012-12-11 NOTE — Telephone Encounter (Signed)
Can break them up no alternative

## 2012-12-15 ENCOUNTER — Encounter (HOSPITAL_COMMUNITY): Payer: Self-pay | Admitting: Pharmacy Technician

## 2012-12-18 ENCOUNTER — Encounter (HOSPITAL_COMMUNITY)
Admission: RE | Admit: 2012-12-18 | Discharge: 2012-12-18 | Disposition: A | Discharge status: Home / Self Care | Payer: Medicare Other | Source: Ambulatory Visit | Attending: Surgery | Admitting: Surgery

## 2012-12-18 ENCOUNTER — Encounter (HOSPITAL_COMMUNITY): Payer: Self-pay

## 2012-12-18 ENCOUNTER — Ambulatory Visit (HOSPITAL_COMMUNITY)
Admission: RE | Admit: 2012-12-18 | Discharge: 2012-12-18 | Disposition: A | Discharge status: Home / Self Care | Payer: Medicare Other | Source: Ambulatory Visit | Attending: Anesthesiology | Admitting: Anesthesiology

## 2012-12-18 DIAGNOSIS — Z933 Colostomy status: Secondary | ICD-10-CM | POA: Diagnosis not present

## 2012-12-18 DIAGNOSIS — Z01818 Encounter for other preprocedural examination: Secondary | ICD-10-CM | POA: Diagnosis not present

## 2012-12-18 DIAGNOSIS — Z01812 Encounter for preprocedural laboratory examination: Secondary | ICD-10-CM | POA: Diagnosis not present

## 2012-12-18 DIAGNOSIS — Z01810 Encounter for preprocedural cardiovascular examination: Secondary | ICD-10-CM | POA: Insufficient documentation

## 2012-12-18 DIAGNOSIS — I10 Essential (primary) hypertension: Secondary | ICD-10-CM | POA: Insufficient documentation

## 2012-12-18 HISTORY — DX: Gastro-esophageal reflux disease without esophagitis: K21.9

## 2012-12-18 HISTORY — DX: Unspecified asthma, uncomplicated: J45.909

## 2012-12-18 HISTORY — DX: Malignant (primary) neoplasm, unspecified: C80.1

## 2012-12-18 HISTORY — DX: Anxiety disorder, unspecified: F41.9

## 2012-12-18 LAB — COMPREHENSIVE METABOLIC PANEL
AST: 24 U/L (ref 0–37)
Albumin: 4.4 g/dL (ref 3.5–5.2)
Alkaline Phosphatase: 69 U/L (ref 39–117)
Chloride: 103 mEq/L (ref 96–112)
Potassium: 3.8 mEq/L (ref 3.5–5.1)
Total Bilirubin: 0.4 mg/dL (ref 0.3–1.2)

## 2012-12-18 LAB — CBC WITH DIFFERENTIAL/PLATELET
Basophils Absolute: 0.1 10*3/uL (ref 0.0–0.1)
Basophils Relative: 1 % (ref 0–1)
Hemoglobin: 13.3 g/dL (ref 12.0–15.0)
Lymphocytes Relative: 36 % (ref 12–46)
MCHC: 34.4 g/dL (ref 30.0–36.0)
Monocytes Relative: 8 % (ref 3–12)
Neutro Abs: 3.3 10*3/uL (ref 1.7–7.7)
Neutrophils Relative %: 52 % (ref 43–77)
RDW: 13.4 % (ref 11.5–15.5)
WBC: 6.4 10*3/uL (ref 4.0–10.5)

## 2012-12-18 LAB — SURGICAL PCR SCREEN
MRSA, PCR: NEGATIVE
Staphylococcus aureus: NEGATIVE

## 2012-12-18 NOTE — Pre-Procedure Instructions (Addendum)
Alice Anthony  12/18/2012   Your procedure is scheduled on: Thursday, Dec 25, 2012  Report to [facility] Short Stay [facility] at 8:30AM.  Call this number if you have problems the morning of surgery: [phone]   Remember:   Do not eat food or drink liquids after midnight.   Take these medicines the morning of surgery with A SIP OF WATER: XANAX, AMLODIPINE               Stop taking Aspirin, and herbal medications ( Probiotic Product (ALIGN) 4 MG CAPS.             Do not take any NSAIDs ie: Ibuprofen, Advil, Naproxen or any medication containing Aspirin.  Do not wear jewelry, make-up or nail polish.  Do not wear lotions, powders, or perfumes. You may wear deodorant.  Do not shave 48 hours prior to surgery.              Do not bring valuables to the hospital.  Contacts, dentures or bridgework may not be worn into surgery.  Leave suitcase in the car. After surgery it may be brought to your room.  For patients admitted to the hospital, checkout time is 11:00 AM the day of discharge.   Patients discharged the day of surgery will not be allowed to drive home.  Name and phone number of your driver:  Special Instructions: Shower using CHG 2 nights before surgery and the night before surgery.  If you shower the day of surgery use CHG.  Use special wash - you have one bottle of CHG for all showers.  You should use approximately 1/3 of the bottle for each shower.   Please read over the following fact sheets that you were given: Pain Booklet, Coughing and Deep Breathing, MRSA Information and Surgical Site Infection Prevention

## 2012-12-18 NOTE — Progress Notes (Signed)
Pt denies SOB, chest pain, and being under the care of a cardiologist.  

## 2012-12-24 MED ORDER — ALVIMOPAN 12 MG PO CAPS
12.0000 mg | ORAL_CAPSULE | Freq: Once | ORAL | Status: DC
  Filled 2012-12-24: qty 1

## 2012-12-24 MED ORDER — FLEET ENEMA 7-19 GM/118ML RE ENEM: 1.0000 | ENEMA | Freq: Once | RECTAL | Status: DC

## 2012-12-24 MED ORDER — DEXTROSE 5 % IV SOLN
2.0000 g | INTRAVENOUS | Status: AC
  Administered 2012-12-25: 2 g via INTRAVENOUS
  Filled 2012-12-24: qty 2

## 2012-12-24 MED ORDER — CHLORHEXIDINE GLUCONATE 4 % EX LIQD: 1.0000 "application " | Freq: Once | CUTANEOUS | Status: DC

## 2012-12-25 ENCOUNTER — Encounter (HOSPITAL_COMMUNITY): Payer: Self-pay | Admitting: Certified Registered"

## 2012-12-25 ENCOUNTER — Inpatient Hospital Stay (HOSPITAL_COMMUNITY): Payer: Medicare Other | Admitting: Certified Registered"

## 2012-12-25 ENCOUNTER — Inpatient Hospital Stay (HOSPITAL_COMMUNITY)
Admission: RE | Admit: 2012-12-25 | Discharge: 2013-01-01 | DRG: 330 | Disposition: A | Discharge status: Home Health | Payer: Medicare Other | Source: Ambulatory Visit | Attending: Surgery | Admitting: Surgery

## 2012-12-25 ENCOUNTER — Encounter (HOSPITAL_COMMUNITY)
Admission: RE | Disposition: A | Discharge status: Home Health | Payer: Self-pay | Source: Ambulatory Visit | Attending: Surgery

## 2012-12-25 ENCOUNTER — Encounter (HOSPITAL_COMMUNITY): Payer: Self-pay

## 2012-12-25 DIAGNOSIS — Z79899 Other long term (current) drug therapy: Secondary | ICD-10-CM

## 2012-12-25 DIAGNOSIS — T4275XA Adverse effect of unspecified antiepileptic and sedative-hypnotic drugs, initial encounter: Secondary | ICD-10-CM | POA: Diagnosis not present

## 2012-12-25 DIAGNOSIS — K5732 Diverticulitis of large intestine without perforation or abscess without bleeding: Principal | ICD-10-CM | POA: Diagnosis present

## 2012-12-25 DIAGNOSIS — R110 Nausea: Secondary | ICD-10-CM | POA: Diagnosis not present

## 2012-12-25 DIAGNOSIS — I10 Essential (primary) hypertension: Secondary | ICD-10-CM | POA: Diagnosis present

## 2012-12-25 DIAGNOSIS — Z7982 Long term (current) use of aspirin: Secondary | ICD-10-CM

## 2012-12-25 DIAGNOSIS — E875 Hyperkalemia: Secondary | ICD-10-CM

## 2012-12-25 DIAGNOSIS — K660 Peritoneal adhesions (postprocedural) (postinfection): Secondary | ICD-10-CM | POA: Diagnosis present

## 2012-12-25 DIAGNOSIS — Z9889 Other specified postprocedural states: Secondary | ICD-10-CM | POA: Diagnosis not present

## 2012-12-25 DIAGNOSIS — K651 Peritoneal abscess: Secondary | ICD-10-CM

## 2012-12-25 DIAGNOSIS — I808 Phlebitis and thrombophlebitis of other sites: Secondary | ICD-10-CM | POA: Diagnosis not present

## 2012-12-25 DIAGNOSIS — Y849 Medical procedure, unspecified as the cause of abnormal reaction of the patient, or of later complication, without mention of misadventure at the time of the procedure: Secondary | ICD-10-CM | POA: Diagnosis not present

## 2012-12-25 DIAGNOSIS — K639 Disease of intestine, unspecified: Secondary | ICD-10-CM

## 2012-12-25 DIAGNOSIS — E785 Hyperlipidemia, unspecified: Secondary | ICD-10-CM | POA: Diagnosis present

## 2012-12-25 DIAGNOSIS — T827XXA Infection and inflammatory reaction due to other cardiac and vascular devices, implants and grafts, initial encounter: Secondary | ICD-10-CM | POA: Diagnosis not present

## 2012-12-25 DIAGNOSIS — K631 Perforation of intestine (nontraumatic): Secondary | ICD-10-CM

## 2012-12-25 DIAGNOSIS — Z433 Encounter for attention to colostomy: Secondary | ICD-10-CM | POA: Diagnosis not present

## 2012-12-25 DIAGNOSIS — R935 Abnormal findings on diagnostic imaging of other abdominal regions, including retroperitoneum: Secondary | ICD-10-CM

## 2012-12-25 DIAGNOSIS — K5792 Diverticulitis of intestine, part unspecified, without perforation or abscess without bleeding: Secondary | ICD-10-CM

## 2012-12-25 DIAGNOSIS — R109 Unspecified abdominal pain: Secondary | ICD-10-CM

## 2012-12-25 DIAGNOSIS — R112 Nausea with vomiting, unspecified: Secondary | ICD-10-CM

## 2012-12-25 HISTORY — PX: COLOSTOMY CLOSURE: SHX1381

## 2012-12-25 LAB — CBC
HCT: 37.5 % (ref 36.0–46.0)
Hemoglobin: 12.9 g/dL (ref 12.0–15.0)
MCH: 31.2 pg (ref 26.0–34.0)
MCHC: 34.4 g/dL (ref 30.0–36.0)
MCV: 90.6 fL (ref 78.0–100.0)
RBC: 4.14 MIL/uL (ref 3.87–5.11)

## 2012-12-25 LAB — CREATININE, SERUM: Creatinine, Ser: 0.67 mg/dL (ref 0.50–1.10)

## 2012-12-25 SURGERY — COLOSTOMY CLOSURE
Anesthesia: General

## 2012-12-25 MED ORDER — FENTANYL CITRATE 0.05 MG/ML IJ SOLN
INTRAMUSCULAR | Status: AC
  Administered 2012-12-25: 50 ug via INTRAVENOUS
  Filled 2012-12-25: qty 2

## 2012-12-25 MED ORDER — DIPHENHYDRAMINE HCL 12.5 MG/5ML PO ELIX: 12.5000 mg | ORAL_SOLUTION | Freq: Four times a day (QID) | ORAL | Status: DC | PRN

## 2012-12-25 MED ORDER — ONDANSETRON HCL 4 MG/2ML IJ SOLN
4.0000 mg | Freq: Four times a day (QID) | INTRAMUSCULAR | Status: DC | PRN
  Filled 2012-12-25 (×3): qty 2

## 2012-12-25 MED ORDER — FENTANYL CITRATE 0.05 MG/ML IJ SOLN
INTRAMUSCULAR | Status: DC | PRN
  Administered 2012-12-25: 50 ug via INTRAVENOUS
  Administered 2012-12-25: 25 ug via INTRAVENOUS
  Administered 2012-12-25: 50 ug via INTRAVENOUS
  Administered 2012-12-25: 25 ug via INTRAVENOUS
  Administered 2012-12-25: 100 ug via INTRAVENOUS
  Administered 2012-12-25: 25 ug via INTRAVENOUS

## 2012-12-25 MED ORDER — ALVIMOPAN 12 MG PO CAPS
12.0000 mg | ORAL_CAPSULE | Freq: Two times a day (BID) | ORAL | Status: DC
  Administered 2012-12-26 – 2012-12-29 (×8): 12 mg via ORAL
  Filled 2012-12-25 (×11): qty 1

## 2012-12-25 MED ORDER — 0.9 % SODIUM CHLORIDE (POUR BTL) OPTIME
TOPICAL | Status: DC | PRN
  Administered 2012-12-25: 1000 mL

## 2012-12-25 MED ORDER — SODIUM CHLORIDE 0.9 % IJ SOLN: 9.0000 mL | INTRAMUSCULAR | Status: DC | PRN

## 2012-12-25 MED ORDER — SIMVASTATIN 20 MG PO TABS
20.0000 mg | ORAL_TABLET | Freq: Every evening | ORAL | Status: DC
  Administered 2012-12-27 – 2012-12-31 (×5): 20 mg via ORAL
  Filled 2012-12-25 (×8): qty 1

## 2012-12-25 MED ORDER — DIPHENHYDRAMINE HCL 50 MG/ML IJ SOLN
12.5000 mg | Freq: Four times a day (QID) | INTRAMUSCULAR | Status: DC | PRN
  Administered 2012-12-25 – 2012-12-29 (×5): 12.5 mg via INTRAVENOUS
  Filled 2012-12-25 (×5): qty 1

## 2012-12-25 MED ORDER — PROPOFOL 10 MG/ML IV BOLUS
INTRAVENOUS | Status: DC | PRN
  Administered 2012-12-25: 50 mg via INTRAVENOUS
  Administered 2012-12-25: 30 mg via INTRAVENOUS
  Administered 2012-12-25: 120 mg via INTRAVENOUS

## 2012-12-25 MED ORDER — ALVIMOPAN 12 MG PO CAPS
12.0000 mg | ORAL_CAPSULE | Freq: Once | ORAL | Status: AC
  Administered 2012-12-25: 12 mg via ORAL
  Filled 2012-12-25: qty 1

## 2012-12-25 MED ORDER — AMLODIPINE BESYLATE 5 MG PO TABS
5.0000 mg | ORAL_TABLET | Freq: Every day | ORAL | Status: DC
  Administered 2012-12-26 – 2013-01-01 (×6): 5 mg via ORAL
  Filled 2012-12-25 (×7): qty 1

## 2012-12-25 MED ORDER — PANTOPRAZOLE SODIUM 40 MG IV SOLR
40.0000 mg | INTRAVENOUS | Status: DC
  Administered 2012-12-25 – 2012-12-31 (×7): 40 mg via INTRAVENOUS
  Filled 2012-12-25 (×8): qty 40

## 2012-12-25 MED ORDER — ONDANSETRON HCL 4 MG PO TABS
4.0000 mg | ORAL_TABLET | Freq: Four times a day (QID) | ORAL | Status: DC | PRN
  Administered 2012-12-31: 4 mg via ORAL
  Filled 2012-12-25: qty 1

## 2012-12-25 MED ORDER — ARTIFICIAL TEARS OP OINT
TOPICAL_OINTMENT | OPHTHALMIC | Status: DC | PRN
  Administered 2012-12-25: 1 via OPHTHALMIC

## 2012-12-25 MED ORDER — PHENYLEPHRINE HCL 10 MG/ML IJ SOLN
INTRAMUSCULAR | Status: DC | PRN
  Administered 2012-12-25 (×2): 40 ug via INTRAVENOUS
  Administered 2012-12-25: 80 ug via INTRAVENOUS
  Administered 2012-12-25: 40 ug via INTRAVENOUS

## 2012-12-25 MED ORDER — NEOSTIGMINE METHYLSULFATE 1 MG/ML IJ SOLN
INTRAMUSCULAR | Status: DC | PRN
  Administered 2012-12-25: 3 mg via INTRAVENOUS

## 2012-12-25 MED ORDER — ENOXAPARIN SODIUM 40 MG/0.4ML ~~LOC~~ SOLN
40.0000 mg | SUBCUTANEOUS | Status: DC
  Administered 2012-12-26 – 2012-12-31 (×6): 40 mg via SUBCUTANEOUS
  Filled 2012-12-25 (×10): qty 0.4

## 2012-12-25 MED ORDER — HYDROMORPHONE 0.3 MG/ML IV SOLN
INTRAVENOUS | Status: AC
  Administered 2012-12-25: 15:00:00
  Filled 2012-12-25: qty 25

## 2012-12-25 MED ORDER — DEXTROSE 5 % IV SOLN
1.0000 g | Freq: Four times a day (QID) | INTRAVENOUS | Status: AC
  Administered 2012-12-25: 1 g via INTRAVENOUS
  Filled 2012-12-25: qty 1

## 2012-12-25 MED ORDER — KCL IN DEXTROSE-NACL 20-5-0.9 MEQ/L-%-% IV SOLN
INTRAVENOUS | Status: AC
  Administered 2012-12-25: 20:00:00 via INTRAVENOUS
  Filled 2012-12-25 (×2): qty 1000

## 2012-12-25 MED ORDER — FENTANYL CITRATE 0.05 MG/ML IJ SOLN
25.0000 ug | INTRAMUSCULAR | Status: DC | PRN
  Administered 2012-12-25: 25 ug via INTRAVENOUS
  Administered 2012-12-25: 50 ug via INTRAVENOUS

## 2012-12-25 MED ORDER — FENTANYL CITRATE 0.05 MG/ML IJ SOLN
INTRAMUSCULAR | Status: AC
  Administered 2012-12-25: 25 ug via INTRAVENOUS
  Filled 2012-12-25: qty 2

## 2012-12-25 MED ORDER — HYDROMORPHONE 0.3 MG/ML IV SOLN
INTRAVENOUS | Status: DC
  Administered 2012-12-25: 1.5 mg via INTRAVENOUS
  Administered 2012-12-26: 0.905 mg via INTRAVENOUS
  Administered 2012-12-26: 0.9 mg via INTRAVENOUS
  Administered 2012-12-26: 18:00:00 via INTRAVENOUS
  Administered 2012-12-26: 0.6 mg via INTRAVENOUS
  Administered 2012-12-26: 1.2 mg via INTRAVENOUS
  Administered 2012-12-26: 0.6 mg via INTRAVENOUS
  Administered 2012-12-27 (×2): 0.9 mg via INTRAVENOUS
  Administered 2012-12-27: 0.3 mg via INTRAVENOUS
  Administered 2012-12-27: 1.5 mg via INTRAVENOUS
  Administered 2012-12-27: 0.54 mg via INTRAVENOUS
  Administered 2012-12-28: 1.5 mg via INTRAVENOUS
  Administered 2012-12-28: 02:00:00 via INTRAVENOUS
  Administered 2012-12-28: 1.2 mg via INTRAVENOUS
  Administered 2012-12-29: 1.8 mg via INTRAVENOUS
  Filled 2012-12-25 (×2): qty 25

## 2012-12-25 MED ORDER — NALOXONE HCL 0.4 MG/ML IJ SOLN: 0.4000 mg | INTRAMUSCULAR | Status: DC | PRN

## 2012-12-25 MED ORDER — ONDANSETRON HCL 4 MG/2ML IJ SOLN
4.0000 mg | Freq: Four times a day (QID) | INTRAMUSCULAR | Status: DC | PRN
  Administered 2012-12-26 – 2012-12-30 (×3): 4 mg via INTRAVENOUS
  Filled 2012-12-25: qty 2

## 2012-12-25 MED ORDER — ROCURONIUM BROMIDE 100 MG/10ML IV SOLN
INTRAVENOUS | Status: DC | PRN
  Administered 2012-12-25: 10 mg via INTRAVENOUS
  Administered 2012-12-25: 40 mg via INTRAVENOUS
  Administered 2012-12-25 (×4): 10 mg via INTRAVENOUS

## 2012-12-25 MED ORDER — LACTATED RINGERS IV SOLN
INTRAVENOUS | Status: DC
  Administered 2012-12-25 (×4): via INTRAVENOUS

## 2012-12-25 MED ORDER — LIDOCAINE HCL (CARDIAC) 20 MG/ML IV SOLN
INTRAVENOUS | Status: DC | PRN
  Administered 2012-12-25: 60 mg via INTRAVENOUS

## 2012-12-25 MED ORDER — ONDANSETRON HCL 4 MG/2ML IJ SOLN
INTRAMUSCULAR | Status: DC | PRN
  Administered 2012-12-25: 4 mg via INTRAVENOUS

## 2012-12-25 MED ORDER — GLYCOPYRROLATE 0.2 MG/ML IJ SOLN
INTRAMUSCULAR | Status: DC | PRN
  Administered 2012-12-25: 0.2 mg via INTRAVENOUS
  Administered 2012-12-25: 0.6 mg via INTRAVENOUS

## 2012-12-25 MED ORDER — MIDAZOLAM HCL 5 MG/5ML IJ SOLN
INTRAMUSCULAR | Status: DC | PRN
  Administered 2012-12-25: 2 mg via INTRAVENOUS

## 2012-12-25 SURGICAL SUPPLY — 59 items
BANDAGE GAUZE ELAST BULKY 4 IN (GAUZE/BANDAGES/DRESSINGS) ×2 IMPLANT
BLADE SURG CLIPPER 3M 9600 (MISCELLANEOUS) ×2 IMPLANT
BLADE SURG ROTATE 9660 (MISCELLANEOUS) IMPLANT
CANISTER SUCTION 2500CC (MISCELLANEOUS) ×4 IMPLANT
CLOTH BEACON ORANGE TIMEOUT ST (SAFETY) ×2 IMPLANT
COVER MAYO STAND STRL (DRAPES) ×2 IMPLANT
COVER SURGICAL LIGHT HANDLE (MISCELLANEOUS) ×2 IMPLANT
DRAPE LAPAROSCOPIC ABDOMINAL (DRAPES) ×2 IMPLANT
DRAPE PROXIMA HALF (DRAPES) ×2 IMPLANT
DRAPE UTILITY 15X26 W/TAPE STR (DRAPE) ×8 IMPLANT
DRAPE WARM FLUID 44X44 (DRAPE) ×2 IMPLANT
DRSG OPSITE POSTOP 4X10 (GAUZE/BANDAGES/DRESSINGS) ×2 IMPLANT
ELECT BLADE 6.5 EXT (BLADE) ×2 IMPLANT
ELECT REM PT RETURN 9FT ADLT (ELECTROSURGICAL) ×2
ELECTRODE REM PT RTRN 9FT ADLT (ELECTROSURGICAL) ×1 IMPLANT
GEL ULTRASOUND 20GR AQUASONIC (MISCELLANEOUS) ×2 IMPLANT
GLOVE BIOGEL PI IND STRL 7.0 (GLOVE) ×4 IMPLANT
GLOVE BIOGEL PI IND STRL 8 (GLOVE) ×3 IMPLANT
GLOVE BIOGEL PI INDICATOR 7.0 (GLOVE) ×4
GLOVE BIOGEL PI INDICATOR 8 (GLOVE) ×3
GLOVE SURG SS PI 6.5 STRL IVOR (GLOVE) ×6 IMPLANT
GLOVE SURG SS PI 7.0 STRL IVOR (GLOVE) ×14 IMPLANT
GLOVE SURG SS PI 7.5 STRL IVOR (GLOVE) ×4 IMPLANT
GLOVE SURG SS PI 8.0 STRL IVOR (GLOVE) ×8 IMPLANT
GOWN PREVENTION PLUS XXLARGE (GOWN DISPOSABLE) ×4 IMPLANT
GOWN STRL NON-REIN LRG LVL3 (GOWN DISPOSABLE) ×4 IMPLANT
GOWN STRL REIN XL XLG (GOWN DISPOSABLE) ×6 IMPLANT
KIT BASIN OR (CUSTOM PROCEDURE TRAY) ×2 IMPLANT
KIT ROOM TURNOVER OR (KITS) ×2 IMPLANT
LEGGING LITHOTOMY PAIR STRL (DRAPES) IMPLANT
LIGASURE IMPACT 36 18CM CVD LR (INSTRUMENTS) ×2 IMPLANT
NS IRRIG 1000ML POUR BTL (IV SOLUTION) ×8 IMPLANT
PACK GENERAL/GYN (CUSTOM PROCEDURE TRAY) ×2 IMPLANT
PAD ARMBOARD 7.5X6 YLW CONV (MISCELLANEOUS) ×4 IMPLANT
PAD SHARPS MAGNETIC DISPOSAL (MISCELLANEOUS) ×2 IMPLANT
SPECIMEN JAR MEDIUM (MISCELLANEOUS) ×4 IMPLANT
SPONGE GAUZE 4X4 16PLY UNSTER (WOUND CARE) ×2 IMPLANT
SPONGE LAP 18X18 X RAY DECT (DISPOSABLE) ×12 IMPLANT
STAPLER CIRC CVD 29MM 37CM (STAPLE) ×4 IMPLANT
STAPLER CUT CVD 40MM BLUE (STAPLE) ×2 IMPLANT
STAPLER PROXIMATE 75MM BLUE (STAPLE) ×4 IMPLANT
STAPLER VISISTAT 35W (STAPLE) ×2 IMPLANT
SUCTION POOLE TIP (SUCTIONS) IMPLANT
SUT ETHILON 2 0 FS 18 (SUTURE) ×2 IMPLANT
SUT NOVA 1 T20/GS 25DT (SUTURE) ×4 IMPLANT
SUT PDS AB 1 TP1 96 (SUTURE) ×4 IMPLANT
SUT PROLENE 2 0 KS (SUTURE) ×4 IMPLANT
SUT SILK 3 0 TIES 10X30 (SUTURE) ×2 IMPLANT
SUT VIC AB 2-0 SH 18 (SUTURE) ×2 IMPLANT
SUT VIC AB 3-0 SH 18 (SUTURE) ×6 IMPLANT
SUT VICRYL AB 2 0 TIES (SUTURE) ×2 IMPLANT
SUT VICRYL AB 3 0 TIES (SUTURE) ×2 IMPLANT
SYR BULB IRRIGATION 50ML (SYRINGE) ×2 IMPLANT
TOWEL OR 17X26 10 PK STRL BLUE (TOWEL DISPOSABLE) ×4 IMPLANT
TRAY FOLEY CATH 14FRSI W/METER (CATHETERS) IMPLANT
TRAY FOLEY METER SIL LF 16FR (CATHETERS) ×2 IMPLANT
TRAY PROCTOSCOPIC FIBER OPTIC (SET/KITS/TRAYS/PACK) ×2 IMPLANT
WATER STERILE IRR 1000ML POUR (IV SOLUTION) ×2 IMPLANT
YANKAUER SUCT BULB TIP NO VENT (SUCTIONS) ×2 IMPLANT

## 2012-12-25 NOTE — H&P (View-Only) (Signed)
Patient ID: Alice Anthony, female   DOB: 06/24/1941, 72 y.o.   MRN: [id number]  No chief complaint on file.   HPI Alice Anthony is a 72 y.o. female.  Patient returns for followup of her diverticulitis and colostomy. She's doing well. She is ready to schedule reversal. She is two and a half  half months out from surgery. HPI  Past Medical History  Diagnosis Date  . Diverticulosis   . Hypertension   . Hyperlipemia     Past Surgical History  Procedure Laterality Date  . Tubal ligation    . Tonsillectomy    . Laparotomy N/A 09/19/2012    Procedure: EXPLORATORY LAPAROTOMY, sigmoid colectomy;  Surgeon: Anye Brose A. Deundre Thong, MD;  Location: [facility];  Service: General;  Laterality: N/A;  . Trigger finger release  12/25/2010  . Sigmoidoscopy  01/11/1999  . Colonoscopy  03/2012  . Abdominal surgery  09/19/2012    Ruptured Diverticuli, Ostomy Placed.    Family History  Problem Relation Age of Onset  . Diabetes Father   . Alzheimer's disease Father   . Basal cell carcinoma Mother     Metastatic    Social History History  Substance Use Topics  . Smoking status: Never Smoker   . Smokeless tobacco: Never Used  . Alcohol Use: No    Allergies  Allergen Reactions  . Codeine   . Fosamax (Alendronate Sodium)   . Latex   . Vioxx (Rofecoxib)     Current Outpatient Prescriptions  Medication Sig Dispense Refill  . acidophilus (RISAQUAD) CAPS Take 1 capsule by mouth every evening.      . ALPRAZolam (XANAX) 0.25 MG tablet Take 0.125-0.25 mg by mouth daily as needed for sleep or anxiety.      . amLODipine (NORVASC) 5 MG tablet Take 5 mg by mouth every morning.      . aspirin EC 81 MG tablet Take 81 mg by mouth every morning.      . calcium-vitamin D (OSCAL WITH D) 500-200 MG-UNIT per tablet You can restart this in a week or so when your eating normally and feel better.      . cetirizine (ZYRTEC) 10 MG tablet Take 10 mg by mouth daily.      . cholecalciferol (VITAMIN D) 1000 UNITS tablet  Take 2,000 Units by mouth every morning.      . fluticasone (FLONASE) 50 MCG/ACT nasal spray       . hydrOXYzine (ATARAX/VISTARIL) 25 MG tablet Take 25 mg by mouth at bedtime.      . Multiple Vitamin (MULTIVITAMIN WITH MINERALS) TABS Start this next week when your feeling and eating better.      . pyridoxine (B-6) 200 MG tablet Take 200 mg by mouth every morning.      . simvastatin (ZOCOR) 20 MG tablet Take 20 mg by mouth every evening.       No current facility-administered medications for this visit.    Review of Systems Review of Systems  Constitutional: Negative.   HENT: Negative.   Eyes: Negative.   Respiratory: Negative.   Cardiovascular: Negative.   Gastrointestinal: Negative.   Endocrine: Negative.   Genitourinary: Negative.   Allergic/Immunologic: Negative.   Neurological: Negative.   Hematological: Negative.   Psychiatric/Behavioral: Negative.     Blood pressure 124/72, pulse 76, resp. rate 14, height 5' 2" (1.575 m), weight 134 lb 9.6 oz (61.054 kg).  Physical Exam Physical Exam  Constitutional: She is oriented to person, place, and time. She appears   well-developed and well-nourished.  HENT:  Head: Normocephalic and atraumatic.  Abdominal: Soft. Bowel sounds are normal. There is no tenderness. There is no rebound.    Musculoskeletal: Normal range of motion.  Neurological: She is alert and oriented to person, place, and time.  Skin: Skin is warm and dry.  Psychiatric: She has a normal mood and affect. Her behavior is normal. Judgment and thought content normal.    Data Reviewed Clinical Data: Diverticulitis, surgical resection, for  reanastomosis  SINGLE COLUMN BARIUM ENEMA  Technique: Initial scout AP supine abdominal image obtained to  insure adequate colon cleansing. Barium was introduced into the  colon in a retrograde fashion and refluxed from the rectum to the  cecum. Spot images of the colon followed by overhead radiographs  were obtained.    Fluoroscopy time: 2.2 minutes.  Comparison: CT abdomen pelvis of 09/19/2012  Findings: A preliminary film of the abdomen shows an ostomy in the  left lower quadrant. Some retained contrast is noted within the  region of the rectum.  Initially a barium enema via the rectum was performed. There are  filling defects within the rectum consistent with retained feces.  No other abnormality is seen.  Barium enema via the ostomy was then performed. There are  diverticula within the proximal descending colon and the  transverse colon. The cecum fills normally and the terminal ileum  is unremarkable. A postevacuation film shows diverticula within  the descending and transverse colon.  IMPRESSION:  1. No abnormality of the rectal stump although there is retained  feces making evaluation difficult.  2. Diverticula are noted within the transverse colon and  descending colon.  Original Report Authenticated By: Paul Barry, M.D.   Assessment    History of diverticulitis status post resection with colostomy 2-1/2 months ago    Plan    Closure of colostomy. Risk include bleeding, infection, anastomotic leak, further surgery with colostomy, blood clots, cardiovascular complications, wound complications, hernia, death, organ injury, and the need for other procedures. She wishes to proceed.       Sargun Rummell A. 12/08/2012, 2:17 PM    

## 2012-12-25 NOTE — Anesthesia Preprocedure Evaluation (Addendum)
Anesthesia Evaluation  Patient identified by MRN, date of birth, ID band Patient awake    Reviewed: Allergy & Precautions, H&P , NPO status , Patient's Chart, lab work & pertinent test results  Airway Mallampati: I TM Distance: >3 FB Neck ROM: Full    Dental  (+) Teeth Intact and Dental Advisory Given   Pulmonary asthma ,  breath sounds clear to auscultation        Cardiovascular hypertension, Pt. on medications Rhythm:Regular Rate:Normal     Neuro/Psych Anxiety    GI/Hepatic Neg liver ROS, GERD-  ,  Endo/Other  negative endocrine ROS  Renal/GU negative Renal ROS     Musculoskeletal   Abdominal   Peds  Hematology   Anesthesia Other Findings   Reproductive/Obstetrics                          Anesthesia Physical Anesthesia Plan  ASA: III  Anesthesia Plan: General   Post-op Pain Management:    Induction: Intravenous  Airway Management Planned: Oral ETT  Additional Equipment:   Intra-op Plan:   Post-operative Plan: Extubation in OR  Informed Consent: I have reviewed the patients History and Physical, chart, labs and discussed the procedure including the risks, benefits and alternatives for the proposed anesthesia with the patient or authorized representative who has indicated his/her understanding and acceptance.   Dental advisory given  Plan Discussed with: CRNA, Anesthesiologist and Surgeon  Anesthesia Plan Comments:         Anesthesia Quick Evaluation

## 2012-12-25 NOTE — Interval H&P Note (Signed)
History and Physical Interval Note:  12/25/2012 9:21 AM  Alice Anthony  has presented today for surgery, with the diagnosis of colostomy  The various methods of treatment have been discussed with the patient and family. After consideration of risks, benefits and other options for treatment, the patient has consented to  Procedure(s): COLOSTOMY CLOSURE (N/A) as a surgical intervention .  The patient's history has been reviewed, patient examined, no change in status, stable for surgery.  I have reviewed the patient's chart and labs.  Questions were answered to the patient's satisfaction.     Jorita Bohanon A.

## 2012-12-25 NOTE — Anesthesia Procedure Notes (Addendum)
Procedure Name: Intubation Date/Time: 12/25/2012 10:02 AM Performed by: Jefm Miles E Pre-anesthesia Checklist: Patient identified, Timeout performed, Emergency Drugs available, Suction available and Patient being monitored Patient Re-evaluated:Patient Re-evaluated prior to inductionOxygen Delivery Method: Circle system utilized Preoxygenation: Pre-oxygenation with 100% oxygen Intubation Type: IV induction Ventilation: Mask ventilation without difficulty Laryngoscope Size: Mac Grade View: Grade I Tube size: 7.0 mm Number of attempts: 2 Airway Equipment and Method: Stylet and Video-laryngoscopy Placement Confirmation: ETT inserted through vocal cords under direct vision,  breath sounds checked- equal and bilateral and positive ETCO2 Secured at: 22 cm Tube secured with: Tape Dental Injury: Teeth and Oropharynx as per pre-operative assessment  Difficulty Due To: Difficulty was unanticipated Future Recommendations: Recommend- induction with short-acting agent, and alternative techniques readily available Comments: First attempt with Mac 3, grade 3 view unsuccessful, switched to Glidescope with a grade 1 view.

## 2012-12-25 NOTE — Preoperative (Signed)
Beta Blockers   Reason not to administer Beta Blockers:Not Applicable 

## 2012-12-25 NOTE — Transfer of Care (Signed)
Immediate Anesthesia Transfer of Care Note  Patient: Alice Anthony  Procedure(s) Performed: Procedure(s): COLOSTOMY CLOSURE (N/A)  Patient Location: PACU  Anesthesia Type:General  Level of Consciousness: lethargic and responds to stimulation  Airway & Oxygen Therapy: Patient Spontanous Breathing and Patient connected to nasal cannula oxygen  Post-op Assessment: Report given to PACU RN  Post vital signs: Reviewed and stable  Complications: No apparent anesthesia complications

## 2012-12-25 NOTE — Brief Op Note (Signed)
12/25/2012  1:53 PM  PATIENT:  [name]  72 y.o. female  PRE-OPERATIVE DIAGNOSIS:  colostomy  POST-OPERATIVE DIAGNOSIS:  colostomy    PROCEDURE:  Procedure(s): COLOSTOMY CLOSURE (N/A) Takedown splenic flexure Partial colectomy LOA  SURGEON:  Surgeon(s) and Role:    * Agastya Meister A. Brin Ruggerio, MD - Primary    * Romie Levee, MD - Assisting    ANESTHESIA:   general  EBL:  Total I/O In: 3000 [I.V.:3000] Out: 215 [Urine:115; Blood:100]  BLOOD ADMINISTERED:none  DRAINS: none   LOCAL MEDICATIONS USED:  NONE  SPECIMEN:  Source of Specimen:  COLOSTOMY AND RECTOSIGMOID JUNCTION  DISPOSITION OF SPECIMEN:  PATHOLOGY  COUNTS:  YES  TOURNIQUET:  * No tourniquets in log *  DICTATION: .Other Dictation: Dictation Number  [id number]  PLAN OF CARE: Admit to inpatient   PATIENT DISPOSITION:  PACU - hemodynamically stable.   Delay start of Pharmacological VTE agent (>24hrs) due to surgical blood loss or risk of bleeding: no

## 2012-12-25 NOTE — Anesthesia Postprocedure Evaluation (Signed)
  Anesthesia Post-op Note  Patient: Alice Anthony  Procedure(s) Performed: Procedure(s): COLOSTOMY CLOSURE (N/A)  Patient Location: PACU  Anesthesia Type:General  Level of Consciousness: awake  Airway and Oxygen Therapy: Patient Spontanous Breathing  Post-op Pain: mild  Post-op Assessment: Post-op Vital signs reviewed  Post-op Vital Signs: Reviewed  Complications: No apparent anesthesia complications

## 2012-12-26 ENCOUNTER — Encounter (HOSPITAL_COMMUNITY): Payer: Self-pay | Admitting: General Practice

## 2012-12-26 LAB — BASIC METABOLIC PANEL
BUN: 9 mg/dL (ref 6–23)
CO2: 27 mEq/L (ref 19–32)
Chloride: 105 mEq/L (ref 96–112)
GFR calc non Af Amer: 86 mL/min — ABNORMAL LOW (ref >90)
Glucose, Bld: 190 mg/dL — ABNORMAL HIGH (ref 70–99)
Potassium: 4.2 mEq/L (ref 3.5–5.1)
Sodium: 139 mEq/L (ref 135–145)

## 2012-12-26 LAB — CBC
HCT: 33.9 % — ABNORMAL LOW (ref 36.0–46.0)
Hemoglobin: 11.6 g/dL — ABNORMAL LOW (ref 12.0–15.0)
MCH: 31.4 pg (ref 26.0–34.0)
MCHC: 34.2 g/dL (ref 30.0–36.0)
RBC: 3.69 MIL/uL — ABNORMAL LOW (ref 3.87–5.11)

## 2012-12-26 MED ORDER — FLUTICASONE PROPIONATE 50 MCG/ACT NA SUSP
1.0000 | Freq: Every day | NASAL | Status: DC
  Administered 2012-12-26 – 2012-12-31 (×5): 1 via NASAL
  Filled 2012-12-26: qty 16

## 2012-12-26 MED ORDER — ALPRAZOLAM 0.25 MG PO TABS
0.2500 mg | ORAL_TABLET | Freq: Two times a day (BID) | ORAL | Status: DC | PRN
  Administered 2012-12-26 – 2012-12-31 (×7): 0.25 mg via ORAL
  Filled 2012-12-26 (×7): qty 1

## 2012-12-26 NOTE — Op Note (Signed)
NAMELAIRA, PENNINGER                ACCOUNT NO.:  [id number]  MEDICAL RECORD NO.:  [id number]  LOCATION:  [id number]                        FACILITY:  [facility]  PHYSICIAN:  Maisie Fus A. Samarth Ogle, M.D.DATE OF BIRTH:  [birth year]  DATE OF PROCEDURE:  12/25/2012 DATE OF DISCHARGE:                              OPERATIVE REPORT   PREOPERATIVE DIAGNOSIS:  History of perforated diverticulitis, requiring descending colon colostomy.  POSTOPERATIVE DIAGNOSIS:  History of perforated diverticulitis requiring descending colon colostomy.  PROCEDURE: 1. Closure of colostomy. 2. Partial colectomy with resection of the rectosigmoid junction. 3. Lysis of adhesions taking 90 minutes. 4. Rigid sigmoidoscopy. 5. Takedown of splenic flexure.  SURGEON:  Maisie Fus A. Arkeem Harts, M.D.  ASSISTANT:  Dr. Earnest Bailey.  EBL:  100 mL.  IV FLUIDS:  Approximately 3200 mL of crystalloid.  DRAINS:  None.  SPECIMEN:  Rectosigmoid junction and colostomy to pathology.  INDICATIONS FOR PROCEDURE:  The patient is a 72 year old female, appropriate diverticulitis 3 months ago.  She was operated on emergently __________ colostomy.  She presents for takedown of colostomy.  Preop evaluation included barium enema, which showed some residual rectosigmoid junction with small diverticula, otherwise that was normal. The study during colostomy showed some scattered diverticulitis throughout the colon but no stricture or other abnormality.  Procedure was discussed with the patient in the office.  Risks, benefits, and alternative therapies were discussed as is outlined in my history and physical.  She understood all of the above and agreed to proceed.  DESCRIPTION OF PROCEDURE:  The patient was met in the holding area and questions were answered.  She was then taken back to the operating room and placed supine on the operating room table.  General anesthesia was initiated.  I closed her colostomy with a pursestring suture of 2-0 nylon.   Her abdomen and perineum were prepped and draped in sterile fashion.  She was placed in stirrups and appropriately padded.  Time-out was done and she received preoperative antibiotics as outlined in the record.  Midline incision was used and the old scar was excised. Dissection was carried down through subcutaneous fat and scar until we encountered the fascia, this was opened in midline.  She had very significant intra-abdominal adhesions.  We took these down very slowly with Metzenbaum scissors and cautery.  This took about 90 minutes to free up her abdomen.  Once this was done, we ran the small bowel from the ligament of Treitz to the ileocecal valve.  There were some oozing areas on the bowel itself and oversewn with 3-0 Vicryl.  No evidence of bowel injury.  Her ascending and transverse colon were otherwise normal grossly.  We then identified her rectal stump.  She had a dilated right fallopian tube consistent with hydrosalpinx.  I went ahead and decompressed this since this was quite tense.  There were no signs of tubo-ovarian abscess or infection.  Right ovary was atrophic but otherwise normal.  Left ovary appeared scarred and atrophic as well. Left fallopian tube was normal.  Uterus otherwise normal.  The stump was identified and there was significant scarring.  I grabbed this with Allis clamps.  We dissected around it.  We then mobilized the  colostomy from inside by ellipsing out the colostomy from the skin and then dissecting off the abdominal wall.  This was brought intra-abdominally. We then mobilized the splenic flexure to give [place] adequate length.  Once this was done, we then placed a pursestring suture around the colostomy site after using a GIA-75 stapling device to freshen up this in.  Once 2- 0 Prolene was in place, we cut the staple line and used a 29 EEA Sizer which seemed to fit but was snugged.  I placed anvil and tied the pursestring suture down around this.  I then  went below.  I dilated up the anal canal.  We passed a 29 EEA dilator into the rectal stump without difficulty.  We had some problem making the dilator make to turn.  We then tried the stapler per rectum.  Again, the rectosigmoid junction was stenosed, it looked like and it could not get the stapler to the staple line.  We then re-examined.  Then, I changed gowns and gloves.  We re-examined the pelvis again.  There appeared to be some distal sigmoid colon.  This was strictured down, it looked like even though this was not noted on barium enema.  We decided to go ahead and resect this down just beyond the rectosigmoid junction and proximal rectum.  We used the LigaSure to take down the mesentery to this segment of the sigmoid colon.  We then placed a curved linear stapler with a blue cartridge just below the rectosigmoid junction and fired it.  The remainder of the sigmoid colon was then passed off the field. Hemostasis was achieved.  We were well away from both ureters.  We then decided to see if the colon would reach and we decided take the IMV at its origin to give [place] more length __________.  I then mobilized more with the splenic flexure and got the omentum off this part of the colon and gained probably 4-5 cm by doing that.  The bowel remained viable during this process and we had about 20-30 minutes to observe it as we were doing the mobilization to make sure it remained viable after dividing the IMV and IMA.  We then went back below and we were able to pass the dilator easy and the rectal stump was well visualized.  We passed the stapling device per rectum and deployed the spike, taking care to keep the vagina as well as the uterus out of this as well as the ovaries.  We then made sure the colon was twisted, attached the anvil to the stapling device, closed it down taking great care to keep any of the structures out of this.  It was fired without difficulty and withdrawal without  difficulty.  We then clamped the bowel and placed the rigid sigmoidoscope, insufflated air with fluid in the pelvis and saw no evidence of leakage.  This was suctioned out.  There was absolutely no tension at all on the anastomosis.  The abdominal cavity was irrigated. The small bowel was reexamined and no evidence of injury was noted. There were some areas that were oozing from the serosa that we oversewed with 3-0 Vicryl pop-offs.  Stomach normal and liver grossly normal as well as the appendix.  At this point in time, we suctioned all irrigation.  Counted our sponges and needle counts to be correct and removed any retractors we were using.  We then changed gowns and gloves and got rid of all our dirty instruments and redraped.  I then closed the colostomy site with interrupted #1 Novafil.  Double-stranded PDS #1 was used to close the fascia.  We then closed the skin after irrigating with staples and packed open the colostomy site.  All final counts of sponge, needle, and instruments found to be correct at this portion of the case.  The patient was then awoke, extubated, and taken to recovery in satisfactory condition.     Catriona Dillenbeck A. Jaskaran Dauzat, M.D.    TAC/MEDQ  D:  12/25/2012  T:  12/26/2012  Job:  [id number]

## 2012-12-26 NOTE — Progress Notes (Signed)
Utilization Review Completed.   Lorinda Copland, RN, BSN Nurse Case Manager  [phone]  

## 2012-12-26 NOTE — Progress Notes (Signed)
1 Day Post-Op  Subjective: Sore  Objective: Vital signs in last 24 hours: Temp:  [96.7 F (35.9 C)-98.4 F (36.9 C)] 98.1 F (36.7 C) (05/23 0516) Pulse Rate:  [67-103] 99 (05/23 0516) Resp:  [12-18] 18 (05/23 0516) BP: (110-150)/(49-95) 110/56 mmHg (05/23 0516) SpO2:  [95 %-100 %] 97 % (05/23 0516) Weight:  [133 lb 15.9 oz (60.78 kg)] 133 lb 15.9 oz (60.78 kg) (05/22 1755) Last BM Date: 12/24/12  Intake/Output from previous day: 05/22 0701 - 05/23 0700 In: 4650 [I.V.:4600; IV Piggyback:50] Out: 1360 [Urine:1165; Emesis/NG output:95; Blood:100] Intake/Output this shift:    Incision/Wound:clean dry intact. Ostomy site dressed.  Lab Results:   Recent Labs  12/25/12 1822 12/26/12 0540  WBC 13.5* 11.6*  HGB 12.9 11.6*  HCT 37.5 33.9*  PLT 219 209   BMET  Recent Labs  12/25/12 1822 12/26/12 0540  NA  --  139  K  --  4.2  CL  --  105  CO2  --  27  GLUCOSE  --  190*  BUN  --  9  CREATININE 0.67 0.69  CALCIUM  --  8.4   PT/INR No results found for this basename: LABPROT, INR,  in the last 72 hours ABG No results found for this basename: PHART, PCO2, PO2, HCO3,  in the last 72 hours  Studies/Results: No results found.  Anti-infectives: Anti-infectives   Start     Dose/Rate Route Frequency Ordered Stop   12/25/12 1900  cefOXitin (MEFOXIN) 1 g in dextrose 5 % 50 mL IVPB     1 g 100 mL/hr over 30 Minutes Intravenous 4 times per day 12/25/12 1809 12/25/12 2030   12/25/12 0600  cefOXitin (MEFOXIN) 2 g in dextrose 5 % 50 mL IVPB     2 g 100 mL/hr over 30 Minutes Intravenous On call to O.R. 12/24/12 1420 12/25/12 1004      Assessment/Plan: s/p Procedure(s): COLOSTOMY CLOSURE (N/A) d/c foley remove NG. OOB.START WET TO DRY DRESSINGS TO OSTOMY SITE.   LOS: 1 day    Alice Anthony A. 12/26/2012

## 2012-12-27 MED ORDER — KCL IN DEXTROSE-NACL 20-5-0.45 MEQ/L-%-% IV SOLN
INTRAVENOUS | Status: DC
  Administered 2012-12-27 – 2012-12-30 (×6): via INTRAVENOUS
  Filled 2012-12-27 (×13): qty 1000

## 2012-12-27 MED ORDER — GUAIFENESIN 100 MG/5ML PO SOLN
5.0000 mL | ORAL | Status: DC | PRN
  Administered 2012-12-27 – 2012-12-31 (×2): 100 mg via ORAL
  Filled 2012-12-27 (×2): qty 5

## 2012-12-27 NOTE — Progress Notes (Signed)
Patient's husband spoke to me, concerned that she seemed confused.  Husband stated that she asked about his mother who has been dead for 6 years.  Patient re-evaluated by student nurse to orientation of time, place, person.  Patient answered questions appropriately.

## 2012-12-27 NOTE — Progress Notes (Signed)
2 Days Post-Op  Subjective: No flatus,  N/V  Objective: Vital signs in last 24 hours: Temp:  [97.9 F (36.6 C)-98.5 F (36.9 C)] 98.1 F (36.7 C) (05/24 0515) Pulse Rate:  [92-114] 113 (05/24 0515) Resp:  [16-22] 19 (05/24 0755) BP: (113-129)/(52-62) 123/52 mmHg (05/24 0515) SpO2:  [90 %-98 %] 96 % (05/24 0755) Last BM Date: 12/24/12  Intake/Output from previous day: 05/23 0701 - 05/24 0700 In: -  Out: 450 [Urine:450] Intake/Output this shift:    Incision/Wound:CLEAN DRY INTACT.  OSTOMY SITE INTACT .  OCC BS  Lab Results:   Recent Labs  12/25/12 1822 12/26/12 0540  WBC 13.5* 11.6*  HGB 12.9 11.6*  HCT 37.5 33.9*  PLT 219 209   BMET  Recent Labs  12/25/12 1822 12/26/12 0540  NA  --  139  K  --  4.2  CL  --  105  CO2  --  27  GLUCOSE  --  190*  BUN  --  9  CREATININE 0.67 0.69  CALCIUM  --  8.4   PT/INR No results found for this basename: LABPROT, INR,  in the last 72 hours ABG No results found for this basename: PHART, PCO2, PO2, HCO3,  in the last 72 hours  Studies/Results: No results found.  Anti-infectives: Anti-infectives   Start     Dose/Rate Route Frequency Ordered Stop   12/25/12 1900  cefOXitin (MEFOXIN) 1 g in dextrose 5 % 50 mL IVPB     1 g 100 mL/hr over 30 Minutes Intravenous 4 times per day 12/25/12 1809 12/25/12 2030   12/25/12 0600  cefOXitin (MEFOXIN) 2 g in dextrose 5 % 50 mL IVPB     2 g 100 mL/hr over 30 Minutes Intravenous On call to O.R. 12/24/12 1420 12/25/12 1004      Assessment/Plan: s/p Procedure(s): COLOSTOMY CLOSURE (N/A) robatussin for congestion Await return of bowel function.  LOS: 2 days    Jashua Knaak A. 12/27/2012

## 2012-12-28 MED ORDER — OXYCODONE-ACETAMINOPHEN 5-325 MG PO TABS: 1.0000 | ORAL_TABLET | ORAL | Status: DC | PRN

## 2012-12-28 NOTE — Progress Notes (Signed)
3 Days Post-Op  Subjective: Passing gas.  No N/V  Objective: Vital signs in last 24 hours: Temp:  [97.8 F (36.6 C)-99.5 F (37.5 C)] 98.1 F (36.7 C) (05/25 0515) Pulse Rate:  [89-107] 89 (05/25 0515) Resp:  [16-19] 17 (05/25 0515) BP: (104-122)/(50-65) 122/53 mmHg (05/25 0515) SpO2:  [93 %-98 %] 97 % (05/25 0515) Last BM Date: 12/24/12  Intake/Output from previous day: 05/24 0701 - 05/25 0700 In: 440 [P.O.:120; I.V.:320] Out: 2075 [Urine:2075] Intake/Output this shift:    Incision/Wound:incision intact.  BS present.  Soft    Lab Results:   Recent Labs  12/25/12 1822 12/26/12 0540  WBC 13.5* 11.6*  HGB 12.9 11.6*  HCT 37.5 33.9*  PLT 219 209   BMET  Recent Labs  12/25/12 1822 12/26/12 0540  NA  --  139  K  --  4.2  CL  --  105  CO2  --  27  GLUCOSE  --  190*  BUN  --  9  CREATININE 0.67 0.69  CALCIUM  --  8.4   PT/INR No results found for this basename: LABPROT, INR,  in the last 72 hours ABG No results found for this basename: PHART, PCO2, PO2, HCO3,  in the last 72 hours  Studies/Results: No results found.  Anti-infectives: Anti-infectives   Start     Dose/Rate Route Frequency Ordered Stop   12/25/12 1900  cefOXitin (MEFOXIN) 1 g in dextrose 5 % 50 mL IVPB     1 g 100 mL/hr over 30 Minutes Intravenous 4 times per day 12/25/12 1809 12/25/12 2030   12/25/12 0600  cefOXitin (MEFOXIN) 2 g in dextrose 5 % 50 mL IVPB     2 g 100 mL/hr over 30 Minutes Intravenous On call to O.R. 12/24/12 1420 12/25/12 1004      Assessment/Plan: s/p Procedure(s): COLOSTOMY CLOSURE (N/A) Advance diet  LOS: 3 days    Koriana Stepien A. 12/28/2012

## 2012-12-29 LAB — URINALYSIS, ROUTINE W REFLEX MICROSCOPIC
Leukocytes, UA: NEGATIVE
Nitrite: NEGATIVE
Specific Gravity, Urine: 1.008 (ref 1.005–1.030)
Urobilinogen, UA: 0.2 mg/dL (ref 0.0–1.0)

## 2012-12-29 MED ORDER — ACETAMINOPHEN 325 MG PO TABS
650.0000 mg | ORAL_TABLET | Freq: Four times a day (QID) | ORAL | Status: DC | PRN
  Administered 2012-12-29: 650 mg via ORAL
  Filled 2012-12-29: qty 2

## 2012-12-29 MED ORDER — OXYCODONE HCL 5 MG PO TABS
5.0000 mg | ORAL_TABLET | ORAL | Status: DC | PRN
  Administered 2012-12-29 – 2012-12-30 (×2): 5 mg via ORAL
  Filled 2012-12-29 (×2): qty 1

## 2012-12-29 MED ORDER — HYDROMORPHONE HCL PF 1 MG/ML IJ SOLN
1.0000 mg | INTRAMUSCULAR | Status: DC | PRN
  Administered 2012-12-29 – 2013-01-01 (×10): 1 mg via INTRAVENOUS
  Filled 2012-12-29 (×11): qty 1

## 2012-12-29 NOTE — Progress Notes (Signed)
4 Days Post-Op  Subjective: No BM.  Not much flatus.  No nausea or vomiting.  Objective: Vital signs in last 24 hours: Temp:  [97.9 F (36.6 C)-99.1 F (37.3 C)] 97.9 F (36.6 C) (05/26 0539) Pulse Rate:  [64-89] 89 (05/26 0539) Resp:  [16-19] 18 (05/26 0539) BP: (100-118)/(44-55) 100/54 mmHg (05/26 0539) SpO2:  [94 %-97 %] 97 % (05/26 0539) Last BM Date: 12/24/12  Intake/Output from previous day: 05/25 0701 - 05/26 0700 In: 2221.3 [P.O.:600; I.V.:1621.3] Out: 2675 [Urine:2675] Intake/Output this shift:    Incision/Wound:clean.  BS present.  Soft.  Open wound clean.  Lab Results:  No results found for this basename: WBC, HGB, HCT, PLT,  in the last 72 hours BMET No results found for this basename: NA, K, CL, CO2, GLUCOSE, BUN, CREATININE, CALCIUM,  in the last 72 hours PT/INR No results found for this basename: LABPROT, INR,  in the last 72 hours ABG No results found for this basename: PHART, PCO2, PO2, HCO3,  in the last 72 hours  Studies/Results: No results found.  Anti-infectives: Anti-infectives   Start     Dose/Rate Route Frequency Ordered Stop   12/25/12 1900  cefOXitin (MEFOXIN) 1 g in dextrose 5 % 50 mL IVPB     1 g 100 mL/hr over 30 Minutes Intravenous 4 times per day 12/25/12 1809 12/25/12 2030   12/25/12 0600  cefOXitin (MEFOXIN) 2 g in dextrose 5 % 50 mL IVPB     2 g 100 mL/hr over 30 Minutes Intravenous On call to O.R. 12/24/12 1420 12/25/12 1004      Assessment/Plan: s/p Procedure(s): COLOSTOMY CLOSURE (N/A) Keep on clears until BM D/C PCA  LOS: 4 days    Elonzo Sopp A. 12/29/2012

## 2012-12-30 MED ORDER — LORATADINE 10 MG PO TABS
10.0000 mg | ORAL_TABLET | Freq: Every day | ORAL | Status: DC
  Administered 2012-12-30 – 2012-12-31 (×2): 10 mg via ORAL
  Filled 2012-12-30 (×4): qty 1

## 2012-12-30 MED ORDER — FLUTICASONE PROPIONATE 50 MCG/ACT NA SUSP: 1.0000 | Freq: Every day | NASAL | Status: DC

## 2012-12-30 MED ORDER — PROMETHAZINE HCL 25 MG/ML IJ SOLN
12.5000 mg | Freq: Once | INTRAMUSCULAR | Status: AC
  Administered 2012-12-30: 12.5 mg via INTRAVENOUS
  Filled 2012-12-30: qty 1

## 2012-12-30 MED ORDER — HYDROXYZINE HCL 10 MG PO TABS
10.0000 mg | ORAL_TABLET | Freq: Three times a day (TID) | ORAL | Status: DC | PRN
  Administered 2012-12-31: 10 mg via ORAL
  Filled 2012-12-30: qty 1

## 2012-12-30 NOTE — Progress Notes (Signed)
5 Days Post-Op  Subjective: BM yesterday  Cramps.  Wants allergy medicine  Objective: Vital signs in last 24 hours: Temp:  [97.9 F (36.6 C)-98.4 F (36.9 C)] 98.3 F (36.8 C) (05/27 0514) Pulse Rate:  [70-85] 84 (05/27 0514) Resp:  [14-18] 16 (05/27 0514) BP: (103-130)/(45-91) 123/50 mmHg (05/27 0514) SpO2:  [95 %-98 %] 98 % (05/27 0514) Last BM Date: 12/29/12  Intake/Output from previous day: 05/26 0701 - 05/27 0700 In: 1820 [P.O.:720; I.V.:1100] Out: 2251 [Urine:2250; Stool:1] Intake/Output this shift:    Incision/Wound:clean dry intact.  Ostomy site clean  BS present slight distension.  Lab Results:  No results found for this basename: WBC, HGB, HCT, PLT,  in the last 72 hours BMET No results found for this basename: NA, K, CL, CO2, GLUCOSE, BUN, CREATININE, CALCIUM,  in the last 72 hours PT/INR No results found for this basename: LABPROT, INR,  in the last 72 hours ABG No results found for this basename: PHART, PCO2, PO2, HCO3,  in the last 72 hours  Studies/Results: No results found.  Anti-infectives: Anti-infectives   Start     Dose/Rate Route Frequency Ordered Stop   12/25/12 1900  cefOXitin (MEFOXIN) 1 g in dextrose 5 % 50 mL IVPB     1 g 100 mL/hr over 30 Minutes Intravenous 4 times per day 12/25/12 1809 12/25/12 2030   12/25/12 0600  cefOXitin (MEFOXIN) 2 g in dextrose 5 % 50 mL IVPB     2 g 100 mL/hr over 30 Minutes Intravenous On call to O.R. 12/24/12 1420 12/25/12 1004      Assessment/Plan: s/p Procedure(s): COLOSTOMY CLOSURE (N/A) Advance diet Add meds back  LOS: 5 days    Nyashia Raney A. 12/30/2012

## 2012-12-31 MED ORDER — METOCLOPRAMIDE HCL 10 MG PO TABS
10.0000 mg | ORAL_TABLET | Freq: Four times a day (QID) | ORAL | Status: DC | PRN
  Administered 2012-12-31: 10 mg via ORAL
  Filled 2012-12-31: qty 1

## 2012-12-31 MED ORDER — LORATADINE 10 MG PO TABS
10.0000 mg | ORAL_TABLET | Freq: Every day | ORAL | Status: DC
  Filled 2012-12-31: qty 1

## 2012-12-31 MED ORDER — FLUTICASONE PROPIONATE 50 MCG/ACT NA SUSP
1.0000 | Freq: Every day | NASAL | Status: DC
  Filled 2012-12-31: qty 16

## 2012-12-31 NOTE — Progress Notes (Signed)
6 Days Post-Op  Subjective: Feels better today had nausea with percocet.  Passing gas and small BM  Objective: Vital signs in last 24 hours: Temp:  [97.8 F (36.6 C)-98.7 F (37.1 C)] 98 F (36.7 C) (05/28 0502) Pulse Rate:  [77-95] 77 (05/28 0502) Resp:  [16-20] 20 (05/28 0502) BP: (117-147)/(53-64) 119/57 mmHg (05/28 0502) SpO2:  [93 %-99 %] 97 % (05/28 0502) Last BM Date: 12/30/12  Intake/Output from previous day: 05/27 0701 - 05/28 0700 In: 1470 [P.O.:120; I.V.:1350] Out: 600 [Urine:600] Intake/Output this shift:    Incision/Wound:C/D/I  Ostomy site clean.  Less distended  Lab Results:  No results found for this basename: WBC, HGB, HCT, PLT,  in the last 72 hours BMET No results found for this basename: NA, K, CL, CO2, GLUCOSE, BUN, CREATININE, CALCIUM,  in the last 72 hours PT/INR No results found for this basename: LABPROT, INR,  in the last 72 hours ABG No results found for this basename: PHART, PCO2, PO2, HCO3,  in the last 72 hours  Studies/Results: No results found.  Anti-infectives: Anti-infectives   Start     Dose/Rate Route Frequency Ordered Stop   12/25/12 1900  cefOXitin (MEFOXIN) 1 g in dextrose 5 % 50 mL IVPB     1 g 100 mL/hr over 30 Minutes Intravenous 4 times per day 12/25/12 1809 12/25/12 2030   12/25/12 0600  cefOXitin (MEFOXIN) 2 g in dextrose 5 % 50 mL IVPB     2 g 100 mL/hr over 30 Minutes Intravenous On call to O.R. 12/24/12 1420 12/25/12 1004      Assessment/Plan: s/p Procedure(s): COLOSTOMY CLOSURE (N/A) Advance diet  LOS: 6 days    Alice Anthony A. 12/31/2012

## 2013-01-01 LAB — CREATININE, SERUM
Creatinine, Ser: 0.63 mg/dL (ref 0.50–1.10)
GFR calc non Af Amer: 88 mL/min — ABNORMAL LOW (ref >90)

## 2013-01-01 MED ORDER — HYDROCODONE-ACETAMINOPHEN 7.5-325 MG/15ML PO SOLN: 10.0000 mL | ORAL | Status: DC | PRN

## 2013-01-01 MED ORDER — HYDROCODONE-ACETAMINOPHEN 7.5-325 MG/15ML PO SOLN: 15.0000 mL | Freq: Four times a day (QID) | ORAL | Status: DC | PRN

## 2013-01-01 MED ORDER — PANTOPRAZOLE SODIUM 40 MG PO TBEC
40.0000 mg | DELAYED_RELEASE_TABLET | Freq: Every day | ORAL | Status: DC
  Administered 2013-01-01: 40 mg via ORAL
  Filled 2013-01-01: qty 1

## 2013-01-01 MED ORDER — HYDROCODONE-ACETAMINOPHEN 7.5-325 MG/15ML PO SOLN
15.0000 mL | ORAL | Status: DC | PRN
  Administered 2013-01-01 (×2): 15 mL via ORAL
  Filled 2013-01-01 (×2): qty 15

## 2013-01-01 MED ORDER — ASPIRIN EC 325 MG PO TBEC: 325.0000 mg | DELAYED_RELEASE_TABLET | Freq: Every day | ORAL | Status: DC

## 2013-01-01 MED ORDER — POLYETHYLENE GLYCOL 3350 17 GM/SCOOP PO POWD: 17.0000 g | Freq: Every day | ORAL | Status: DC

## 2013-01-01 MED ORDER — AMOXICILLIN-POT CLAVULANATE 600-42.9 MG/5ML PO SUSR: 5.0000 mL | Freq: Two times a day (BID) | ORAL | Status: DC

## 2013-01-01 MED ORDER — ONDANSETRON HCL 4 MG PO TABS: 4.0000 mg | ORAL_TABLET | Freq: Four times a day (QID) | ORAL | Status: DC | PRN

## 2013-01-01 NOTE — Progress Notes (Signed)
Discharge Note. Pt and husband present for discharge instructions and teaching. Pt was educated on how to change moist to dry dressings. Pt shared that she had home health come to her home before and helped her change the same type of dressing. Pt and husband were given Rx's and educated on medications. Pt and husband also voiced understanding of sings/symptoms of infection. Pt shared that she felt ready for discharge and was discharged to the care of her husband.

## 2013-01-01 NOTE — Progress Notes (Signed)
7 Days Post-Op  Subjective: Doesn't like pain meds.  Thrombophlebitis left  Arm at IV site.  Moving bowels.  Objective: Vital signs in last 24 hours: Temp:  [97.8 F (36.6 C)-98.9 F (37.2 C)] 97.8 F (36.6 C) (05/29 0550) Pulse Rate:  [79-87] 79 (05/29 0550) Resp:  [16-18] 17 (05/29 0550) BP: (110-128)/(52-70) 119/52 mmHg (05/29 0550) SpO2:  [94 %-98 %] 96 % (05/29 0550) Last BM Date: 12/31/12  Intake/Output from previous day: 05/28 0701 - 05/29 0700 In: 896 [P.O.:720; I.V.:176] Out: -  Intake/Output this shift:    Extremities: area of left arm thombophlebitis noted . Incision/Wound:clean dry intact.  Soft non distended.  clean  Lab Results:  No results found for this basename: WBC, HGB, HCT, PLT,  in the last 72 hours BMET  Recent Labs  01/01/13 0435  CREATININE 0.63   PT/INR No results found for this basename: LABPROT, INR,  in the last 72 hours ABG No results found for this basename: PHART, PCO2, PO2, HCO3,  in the last 72 hours  Studies/Results: No results found.  Anti-infectives: Anti-infectives   Start     Dose/Rate Route Frequency Ordered Stop   12/25/12 1900  cefOXitin (MEFOXIN) 1 g in dextrose 5 % 50 mL IVPB     1 g 100 mL/hr over 30 Minutes Intravenous 4 times per day 12/25/12 1809 12/25/12 2030   12/25/12 0600  cefOXitin (MEFOXIN) 2 g in dextrose 5 % 50 mL IVPB     2 g 100 mL/hr over 30 Minutes Intravenous On call to O.R. 12/24/12 1420 12/25/12 1004      Assessment/Plan: s/p Procedure(s): COLOSTOMY CLOSURE (N/A) Discharge  LOS: 7 days    Sharie Amorin A. 01/01/2013

## 2013-01-01 NOTE — Progress Notes (Signed)
Called to bedside by Nurse.  Patient has some erythema over the middle of her midline incision around her umbilicus.  The other staples have been removed and they are waiting to remove the other staples so I could check the redness.  On exam she has erythema and induration approximately 5cm x 3cm just right of the midline incision.  After removing the last staples a small area of skin opened up with clear serous fluid draining.  Applied a wet to dry dressing over this area of about 1cm and the nurse placed steri-strips over the rest of the incision site.  Patient instructed on BID wet to dry dressing changes of this area.  Started on oral Augmentin due to inability to swallow pills.  Patient instructed on watching for purulent drainage and worsening cellulitis to call the office.  Patient will be scheduled a f/u with Dr. Luisa Hart in 1 week to recheck the wound.

## 2013-01-01 NOTE — Discharge Summary (Signed)
Physician Discharge Summary  Patient ID: Alice Anthony MRN: [id number] DOB/AGE: 02/03/1941 72 y.o.  Admit date: 12/25/2012 Discharge date: 01/01/2013  Admission Diagnoses:colostomy  Discharge Diagnoses: closure of colostomy Active Problems:   * No active hospital problems. *   Discharged Condition: good  Hospital Course: unremarkable.  Bowel function returned on POD 5 and bowels moved.  Tolerating diet adequate diet.  Consults: None  Significant Diagnostic Studies: none  Treatments: surgery: colostomy closure  Discharge Exam: Blood pressure 119/52, pulse 79, temperature 97.8 F (36.6 C), temperature source Oral, resp. rate 17, height 5\' 2"  (1.575 m), weight 133 lb 15.9 oz (60.78 kg), SpO2 96.00%. Incision/Wound:clean dry intact.  Arm show thrombophlebitis. Left minimal at IV site.  Disposition: 06-Home-Health Care Svc  Discharge Orders   Future Orders Complete By Expires     Diet - low sodium heart healthy  As directed     Driving Restrictions  As directed     Comments:      No driving for 2 weeks    Increase activity slowly  As directed     Lifting restrictions  As directed     Comments:      No lifting for 1 month        Medication List    TAKE these medications       ALIGN 4 MG Caps  Take by mouth.     ALPRAZolam 0.25 MG tablet  Commonly known as:  XANAX  Take 0.125-0.25 mg by mouth daily as needed for sleep or anxiety.     amLODipine 5 MG tablet  Commonly known as:  NORVASC  Take 5 mg by mouth daily.     aspirin EC 325 MG tablet  Take 1 tablet (325 mg total) by mouth daily.     calcium carbonate 500 MG chewable tablet  Commonly known as:  TUMS - dosed in mg elemental calcium  Chew 1 tablet by mouth 2 (two) times daily.     CALCIUM PLUS VITAMIN D PO  Take 1 tablet by mouth 2 (two) times daily.     cetirizine 10 MG tablet  Commonly known as:  ZYRTEC  Take 10 mg by mouth at bedtime. REQUIRED NIGHTLY.     fluticasone 50 MCG/ACT nasal spray   Commonly known as:  FLONASE  Place 2 sprays into the nose at bedtime. REQUIRED NIGHTLY     HYDROcodone-acetaminophen 7.5-325 mg/15 ml solution  Commonly known as:  HYCET  Take 10 mLs by mouth every 4 (four) hours as needed.     hydrOXYzine 10 MG tablet  Commonly known as:  ATARAX/VISTARIL  Take 10 mg by mouth at bedtime. REQUIRED NIGHTLY.     multivitamin with minerals Tabs  Take 1 tablet by mouth 2 (two) times daily.     ondansetron 4 MG tablet  Commonly known as:  ZOFRAN  Take 1 tablet (4 mg total) by mouth every 6 (six) hours as needed for nausea.     polyethylene glycol powder powder  Commonly known as:  GLYCOLAX  Take 17 g by mouth daily.     pyridoxine 200 MG tablet  Commonly known as:  B-6  Take 200 mg by mouth daily.     simvastatin 20 MG tablet  Commonly known as:  ZOCOR  Take 20 mg by mouth every evening.     vitamin C 1000 MG tablet  Take 1,000 mg by mouth daily.     Vitamin D3 2000 UNITS capsule  Take 2,000 Units by  mouth daily.         Signed: Aldred Mase A. 01/01/2013, 7:11 AM

## 2013-01-02 NOTE — Progress Notes (Signed)
Ok. Will follow up as outpatient.

## 2013-01-04 ENCOUNTER — Other Ambulatory Visit (INDEPENDENT_AMBULATORY_CARE_PROVIDER_SITE_OTHER): Payer: Self-pay | Admitting: Surgery

## 2013-01-04 DIAGNOSIS — T814XXA Infection following a procedure, initial encounter: Secondary | ICD-10-CM

## 2013-01-05 ENCOUNTER — Other Ambulatory Visit (INDEPENDENT_AMBULATORY_CARE_PROVIDER_SITE_OTHER): Payer: Self-pay | Admitting: General Surgery

## 2013-01-05 ENCOUNTER — Ambulatory Visit (INDEPENDENT_AMBULATORY_CARE_PROVIDER_SITE_OTHER): Payer: Medicare Other | Admitting: General Surgery

## 2013-01-05 ENCOUNTER — Encounter (INDEPENDENT_AMBULATORY_CARE_PROVIDER_SITE_OTHER): Payer: Self-pay | Admitting: General Surgery

## 2013-01-05 ENCOUNTER — Other Ambulatory Visit (INDEPENDENT_AMBULATORY_CARE_PROVIDER_SITE_OTHER): Payer: Self-pay

## 2013-01-05 ENCOUNTER — Telehealth (INDEPENDENT_AMBULATORY_CARE_PROVIDER_SITE_OTHER): Payer: Self-pay | Admitting: *Deleted

## 2013-01-05 VITALS — BP 134/62 | HR 88 | Temp 97.3°F | Resp 20 | Ht 62.0 in | Wt 130.0 lb

## 2013-01-05 DIAGNOSIS — T8140XA Infection following a procedure, unspecified, initial encounter: Secondary | ICD-10-CM

## 2013-01-05 DIAGNOSIS — T8149XA Infection following a procedure, other surgical site, initial encounter: Secondary | ICD-10-CM | POA: Insufficient documentation

## 2013-01-05 NOTE — Progress Notes (Signed)
The patient comes in today with complaints of lower mid abdominal wound tenderness and redness. On examination she's got about a 4 cm circular area in in the inferior portion of her wound which is reddened and tender and feels firm. Her colostomy site was good and has excellent granulation tissue where she has been doing wet-to-dry dressing changes.  Initially I aspirated the more midline fluid collection retrieving approximately 2 cc of turbid fluid. This had the look of a infected seroma. I subsequently anesthetized the area and cleaned with Betadine as had been done before the aspiration then opened its approximately 2 cm and drained out a few more cc of this turbid fluid. I then was able to pack a single saline soaked 4 x 4 gauze into the wound. A culture was sent.  The patient is to start daily wet-to-dry dressings with saline in the midline wound along with the same thing in her stoma site. Because she will have increased pain with this procedure her prescription for pain medication will be refilled. She is to keep her appointment to see Dr. Luisa Hart on June 13. She is currently taking Augmentin therapy for the redness around her wound. It should be continued.

## 2013-01-05 NOTE — Telephone Encounter (Signed)
Patient called today concerned regarding the redness and puffiness around the incision site.  Patient denies any drainage or fevers.  Patient asking that someone just be able to look at it and make sure it looks right to ease her mind.  Patient is very anxious about having an infection.  Patient scheduled for an urgent office appt this afternoon.  Patient is agreeable and appreciative at this time.

## 2013-01-05 NOTE — Addendum Note (Signed)
Addended by: Maryan Puls on: 01/05/2013 04:38 PM   Modules accepted: Orders

## 2013-01-06 ENCOUNTER — Telehealth (INDEPENDENT_AMBULATORY_CARE_PROVIDER_SITE_OTHER): Payer: Self-pay | Admitting: *Deleted

## 2013-01-06 NOTE — Telephone Encounter (Signed)
Patient called today to state that she is having liquid stool with some mild blood.  Suggested to patient to stop the Miralax which she agreed too however she stated the last time she took it was 24 hours ago and is still having the diarrhea.

## 2013-01-06 NOTE — Telephone Encounter (Signed)
Patient states understanding at this time and agreeable.  Patient will keep [place] updated if she continues to have loose stools.

## 2013-01-06 NOTE — Telephone Encounter (Signed)
Agree stop miralax.  It may persist for a few days. Drink plenty of fluids.  Blood in stool not unusual unless heavy.

## 2013-01-07 ENCOUNTER — Ambulatory Visit (INDEPENDENT_AMBULATORY_CARE_PROVIDER_SITE_OTHER): Payer: Medicare Other

## 2013-01-07 ENCOUNTER — Telehealth (INDEPENDENT_AMBULATORY_CARE_PROVIDER_SITE_OTHER): Payer: Self-pay

## 2013-01-07 DIAGNOSIS — Z4801 Encounter for change or removal of surgical wound dressing: Secondary | ICD-10-CM

## 2013-01-07 DIAGNOSIS — T8140XA Infection following a procedure, unspecified, initial encounter: Secondary | ICD-10-CM

## 2013-01-07 NOTE — Telephone Encounter (Signed)
Pt calling concerned about changing her wound dressing.  The wound needs to be packed with damp gauze and she is very insecure about doing it herself.  I told her to come to the office today and a nurse would go over these instructions with her, and pack the wound.  Pt seemed very relieved.

## 2013-01-08 LAB — WOUND CULTURE: Gram Stain: NONE SEEN

## 2013-01-08 NOTE — Progress Notes (Signed)
Pt her today for dressing change.  She has two abdominal wounds which were both repacked with saline gauze and covered with a dry gauze dressing.  She is very apprehensive about these dressing changes and doing them herself.  I demonstrated how the dressing is changed and tried to make her feel more comfortable with the process.  She seemed relieved and grateful.

## 2013-01-09 ENCOUNTER — Telehealth (INDEPENDENT_AMBULATORY_CARE_PROVIDER_SITE_OTHER): Payer: Self-pay

## 2013-01-09 NOTE — Telephone Encounter (Signed)
Pt called for refill of Lortab 7.5/325mg  10cc's (2 tsp) q 4 hrs prn pain.  I suggested she cut back to 1tsp and take Ibuprofen in between.  Pt agreed.

## 2013-01-16 ENCOUNTER — Encounter (INDEPENDENT_AMBULATORY_CARE_PROVIDER_SITE_OTHER): Payer: Self-pay | Admitting: Surgery

## 2013-01-16 ENCOUNTER — Ambulatory Visit (INDEPENDENT_AMBULATORY_CARE_PROVIDER_SITE_OTHER): Payer: Medicare Other | Admitting: Surgery

## 2013-01-16 VITALS — BP 138/72 | HR 80 | Temp 97.6°F | Ht 62.0 in | Wt 130.8 lb

## 2013-01-16 DIAGNOSIS — Z9889 Other specified postprocedural states: Secondary | ICD-10-CM

## 2013-01-16 NOTE — Progress Notes (Signed)
Please return for colostomy closure. She's had a small superficial infection treated by drainage antibiotics this is improved. Her bowel function is radically she's going every day. She's a little bloating.  Exam: Old ostomy site clean. Packing removed the midline incision. No redness minimal drainage. Midline incision repacked.  Impression: Status post colostomy closure with small superficial infection resolved  Plan: Pack  midline wound for one more week and stop. Placed Neosporin and Band-Aid of her ostomy site. Return 3 weeks.

## 2013-01-16 NOTE — Patient Instructions (Signed)
Return 3 weeks.  Stop packing ostomy site. Neosporin to ostomy site and apply bad AID.  Stop packing midline incision next week.

## 2013-01-27 ENCOUNTER — Telehealth (INDEPENDENT_AMBULATORY_CARE_PROVIDER_SITE_OTHER): Payer: Self-pay

## 2013-01-27 NOTE — Telephone Encounter (Signed)
I called patient and let her know I received her letter and will address it next week with Dr Luisa Hart.

## 2013-02-02 ENCOUNTER — Other Ambulatory Visit (INDEPENDENT_AMBULATORY_CARE_PROVIDER_SITE_OTHER): Payer: Self-pay | Admitting: Surgery

## 2013-02-09 ENCOUNTER — Ambulatory Visit (INDEPENDENT_AMBULATORY_CARE_PROVIDER_SITE_OTHER): Payer: Medicare Other | Admitting: Surgery

## 2013-02-09 ENCOUNTER — Encounter (INDEPENDENT_AMBULATORY_CARE_PROVIDER_SITE_OTHER): Payer: Self-pay | Admitting: Surgery

## 2013-02-09 VITALS — BP 120/70 | HR 68 | Temp 98.3°F | Resp 16 | Ht 62.0 in | Wt 129.0 lb

## 2013-02-09 DIAGNOSIS — Z9889 Other specified postprocedural states: Secondary | ICD-10-CM

## 2013-02-09 NOTE — Patient Instructions (Signed)
STOP MIRALAX.     High-Fiber Diet Fiber is found in fruits, vegetables, and grains. A high-fiber diet encourages the addition of more whole grains, legumes, fruits, and vegetables in your diet. The recommended amount of fiber for adult males is 38 g per day. For adult females, it is 25 g per day. Pregnant and lactating women should get 28 g of fiber per day. If you have a digestive or bowel problem, ask your caregiver for advice before adding high-fiber foods to your diet. Eat a variety of high-fiber foods instead of only a select few type of foods.  PURPOSE  To increase stool bulk.  To make bowel movements more regular to prevent constipation.  To lower cholesterol.  To prevent overeating. WHEN IS THIS DIET USED?  It may be used if you have constipation and hemorrhoids.  It may be used if you have uncomplicated diverticulosis (intestine condition) and irritable bowel syndrome.  It may be used if you need help with weight management.  It may be used if you want to add it to your diet as a protective measure against atherosclerosis, diabetes, and cancer. SOURCES OF FIBER  Whole-grain breads and cereals.  Fruits, such as apples, oranges, bananas, berries, prunes, and pears.  Vegetables, such as green peas, carrots, sweet potatoes, beets, broccoli, cabbage, spinach, and artichokes.  Legumes, such split peas, soy, lentils.  Almonds. FIBER CONTENT IN FOODS Starches and Grains / Dietary Fiber (g)  Cheerios, 1 cup / 3 g  Corn Flakes cereal, 1 cup / 0.7 g  Rice crispy treat cereal, 1 cup / 0.3 g  Instant oatmeal (cooked),  cup / 2 g  Frosted wheat cereal, 1 cup / 5.1 g  Brown, long-grain rice (cooked), 1 cup / 3.5 g  White, long-grain rice (cooked), 1 cup / 0.6 g  Enriched macaroni (cooked), 1 cup / 2.5 g Legumes / Dietary Fiber (g)  Baked beans (canned, plain, or vegetarian),  cup / 5.2 g  Kidney beans (canned),  cup / 6.8 g  Pinto beans (cooked),  cup / 5.5  g Breads and Crackers / Dietary Fiber (g)  Plain or honey graham crackers, 2 squares / 0.7 g  Saltine crackers, 3 squares / 0.3 g  Plain, salted pretzels, 10 pieces / 1.8 g  Whole-wheat bread, 1 slice / 1.9 g  White bread, 1 slice / 0.7 g  Raisin bread, 1 slice / 1.2 g  Plain bagel, 3 oz / 2 g  Flour tortilla, 1 oz / 0.9 g  Corn tortilla, 1 small / 1.5 g  Hamburger or hotdog bun, 1 small / 0.9 g Fruits / Dietary Fiber (g)  Apple with skin, 1 medium / 4.4 g  Sweetened applesauce,  cup / 1.5 g  Banana,  medium / 1.5 g  Grapes, 10 grapes / 0.4 g  Orange, 1 small / 2.3 g  Raisin, 1.5 oz / 1.6 g  Melon, 1 cup / 1.4 g Vegetables / Dietary Fiber (g)  Green beans (canned),  cup / 1.3 g  Carrots (cooked),  cup / 2.3 g  Broccoli (cooked),  cup / 2.8 g  Peas (cooked),  cup / 4.4 g  Mashed potatoes,  cup / 1.6 g  Lettuce, 1 cup / 0.5 g  Corn (canned),  cup / 1.6 g  Tomato,  cup / 1.1 g Document Released: 07/23/2005 Document Revised: 01/22/2012 Document Reviewed: 10/25/2011 [facility] Patient Information 2014 [place], [place].

## 2013-02-09 NOTE — Progress Notes (Signed)
Patient is 6 weeks out from colostomy closure. She is feeling bloated and neck she has stopped her MiraLAX and feels better. Her bowels are moving.  Exam: Incisions clean dry and intact.  Impression: Status post colostomy closure  Plan: Return to clinic as needed. Resume full activity as tolerated. Note to get out of jury duty do to not fully recovered from surgery given the patient. Stop MiraLAX. High-fiber diet.

## 2013-02-10 DIAGNOSIS — E785 Hyperlipidemia, unspecified: Secondary | ICD-10-CM | POA: Diagnosis not present

## 2013-02-10 DIAGNOSIS — Z0000 Encounter for general adult medical examination without abnormal findings: Secondary | ICD-10-CM | POA: Diagnosis not present

## 2013-02-10 DIAGNOSIS — F329 Major depressive disorder, single episode, unspecified: Secondary | ICD-10-CM | POA: Diagnosis not present

## 2013-02-10 DIAGNOSIS — E559 Vitamin D deficiency, unspecified: Secondary | ICD-10-CM | POA: Diagnosis not present

## 2013-02-10 DIAGNOSIS — Z79899 Other long term (current) drug therapy: Secondary | ICD-10-CM | POA: Diagnosis not present

## 2013-03-11 ENCOUNTER — Other Ambulatory Visit: Payer: Self-pay

## 2013-03-30 DIAGNOSIS — L821 Other seborrheic keratosis: Secondary | ICD-10-CM | POA: Diagnosis not present

## 2013-03-30 DIAGNOSIS — L820 Inflamed seborrheic keratosis: Secondary | ICD-10-CM | POA: Diagnosis not present

## 2013-05-26 DIAGNOSIS — Z23 Encounter for immunization: Secondary | ICD-10-CM | POA: Diagnosis not present

## 2013-05-26 DIAGNOSIS — H43399 Other vitreous opacities, unspecified eye: Secondary | ICD-10-CM | POA: Diagnosis not present

## 2013-06-11 ENCOUNTER — Other Ambulatory Visit: Payer: Self-pay

## 2013-12-10 DIAGNOSIS — Z1231 Encounter for screening mammogram for malignant neoplasm of breast: Secondary | ICD-10-CM | POA: Diagnosis not present

## 2013-12-19 IMAGING — CT CT ABD-PELV W/ CM
2 of 5 series · 17 of 46 positions shown, 19 images · IV contrast (OMNIPAQUE)
Comparison: CT scan 09/14/2002

CLINICAL DATA: Left lower quadrant pain, and leukocytosis.

CT ABDOMEN AND PELVIS WITH CONTRAST
TECHNIQUE: Multidetector CT imaging of the abdomen and pelvis was
performed following the standard protocol during bolus
administration of intravenous contrast.
Contrast: 100mL OMNIPAQUE IOHEXOL 300 MG/ML  SOLN and he

[Series 2: rtn a/p with · axial · 0.80mm/px · z∈[-462,-56]mm · 14 of 93 slices shown, 16 images]
[im 6/93  soft-tissue]
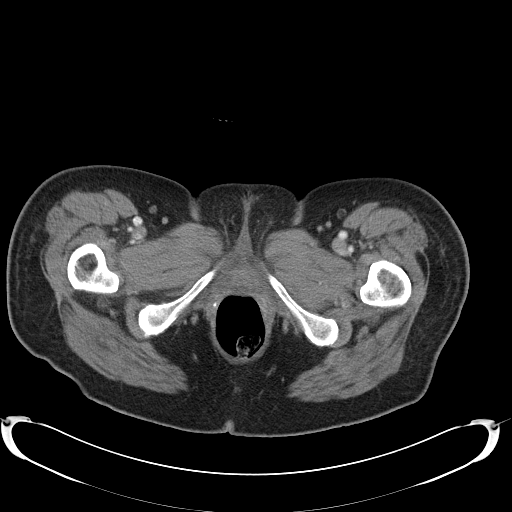
[im 6/93  bone]
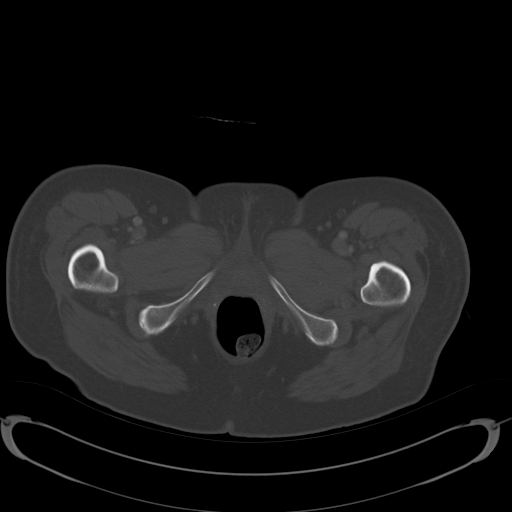
[im 12/93  soft-tissue]
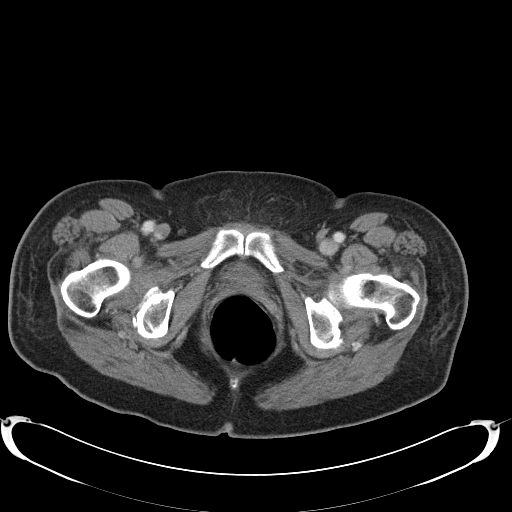
[im 18/93  soft-tissue]
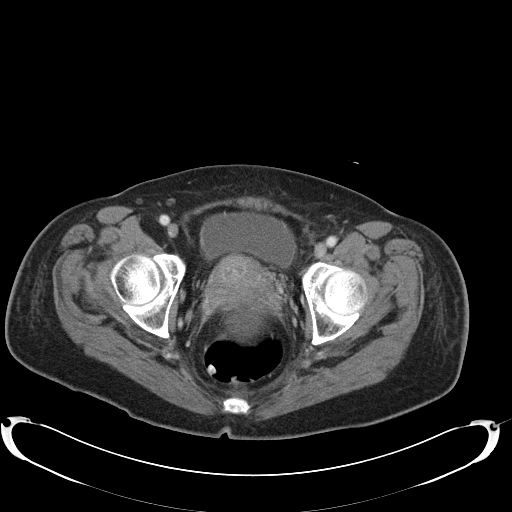
[im 24/93  soft-tissue]
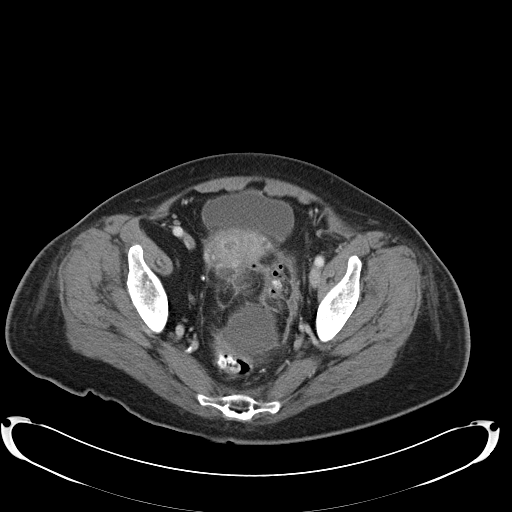
[im 29/93  soft-tissue]
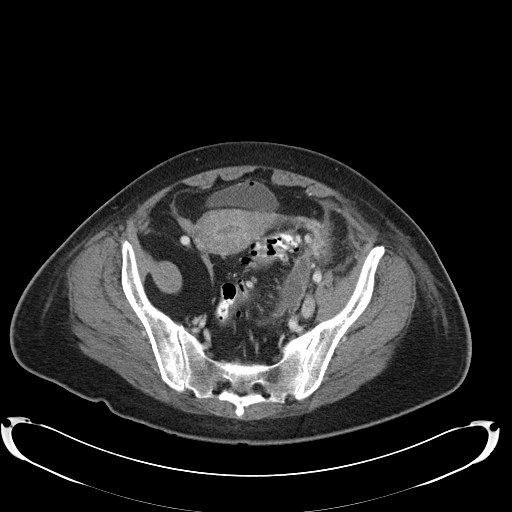
[im 35/93  soft-tissue]
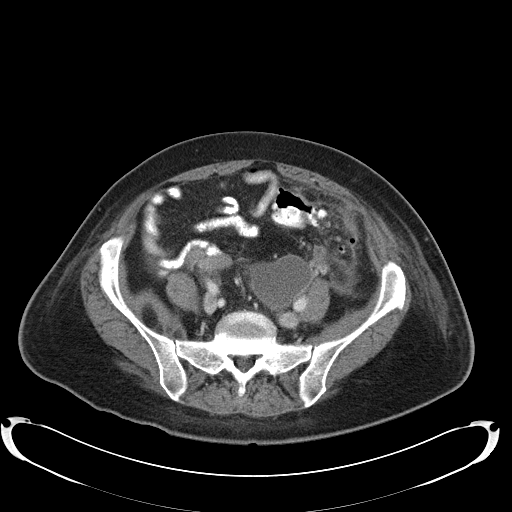
[im 41/93  soft-tissue]
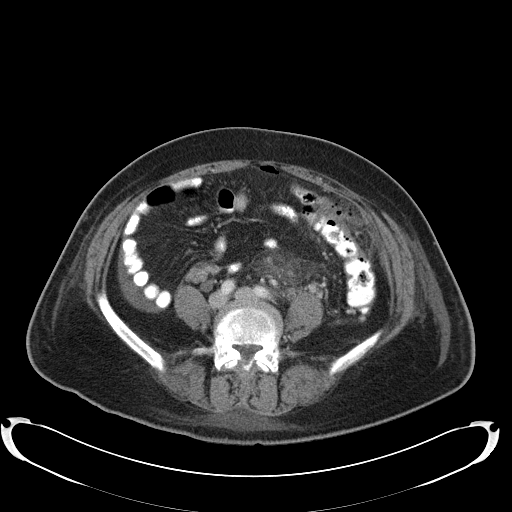
[im 52/93  soft-tissue]
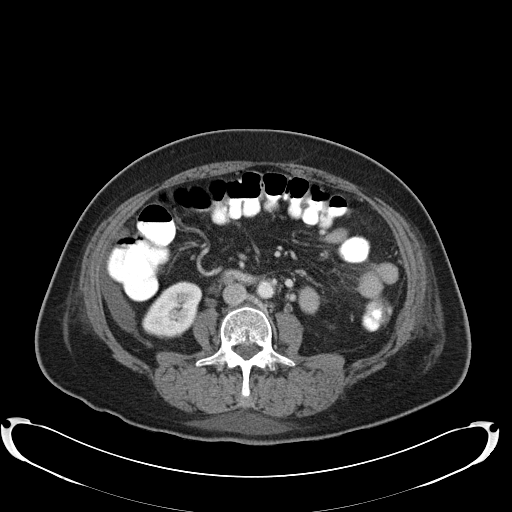
[im 58/93  soft-tissue]
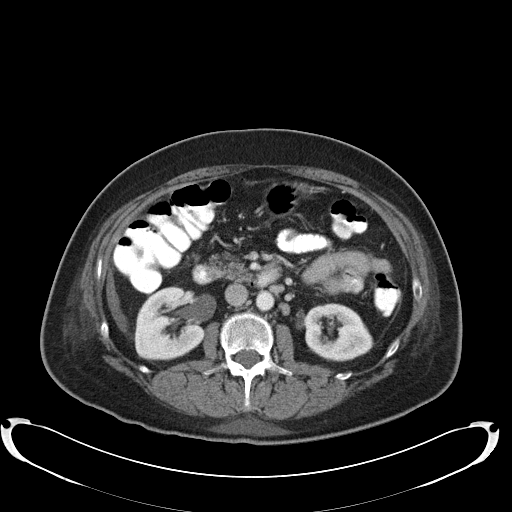
[im 58/93  bone]
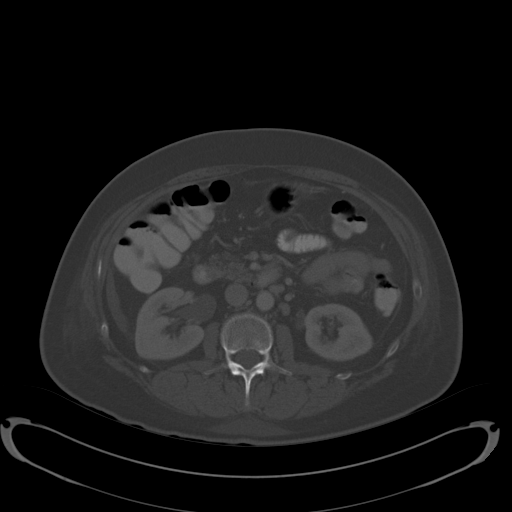
[im 64/93  soft-tissue]
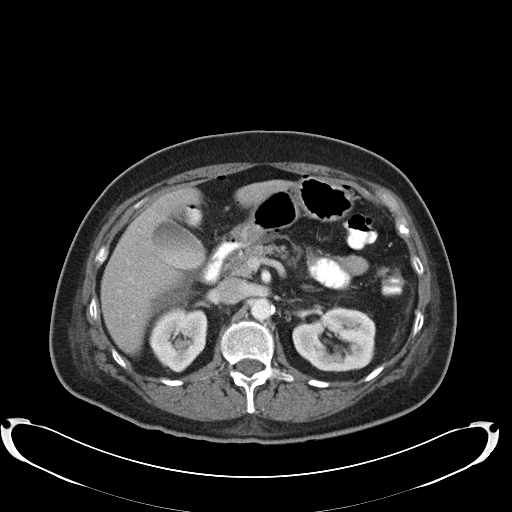
[im 70/93  soft-tissue]
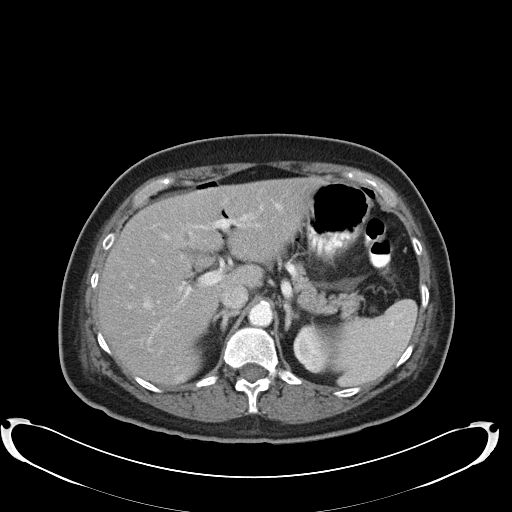
[im 75/93  soft-tissue]
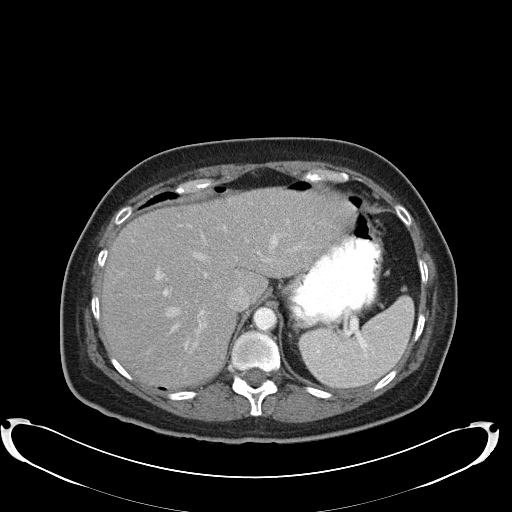
[im 81/93  soft-tissue]
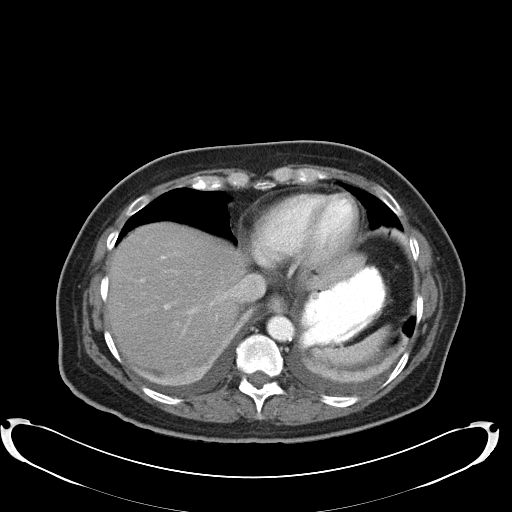
[im 87/93  soft-tissue]
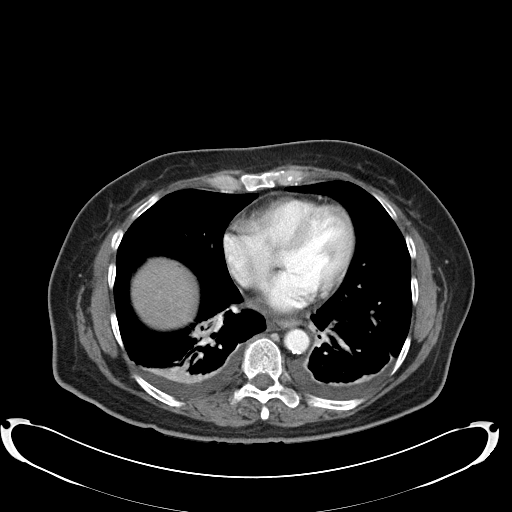

[Series 602: <mpr thick range> · coronal · 0.91mm/px · 3 of 84 slices shown]
[im 28/84  soft-tissue]
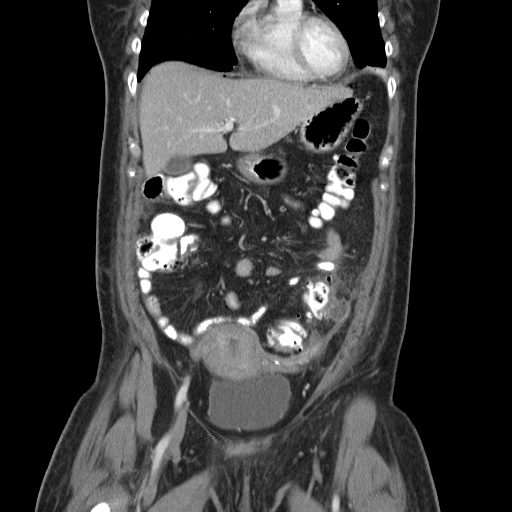
[im 37/84  soft-tissue]
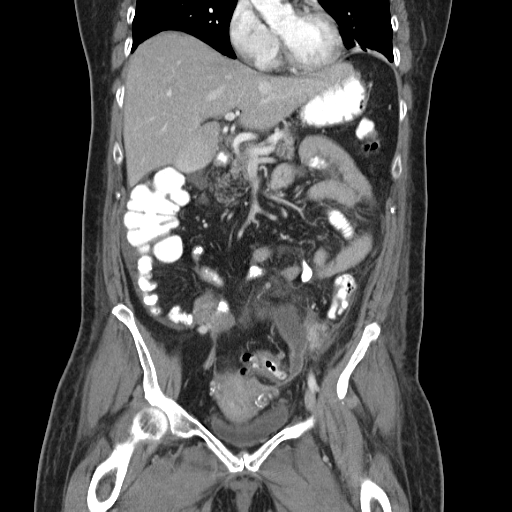
[im 47/84  soft-tissue]
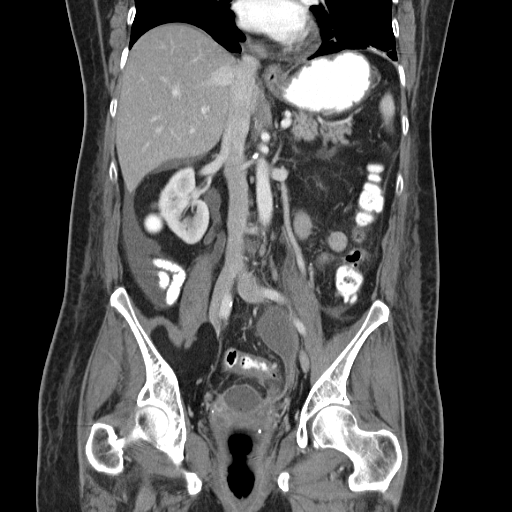

[17 of 46 positions shown; findings below may reference images not displayed]

FINDINGS: The dominant finding is acute diverticulitis with several fluid
collections in the pelvis consistent with abscesses.  Several foci
of gas scattered through the left omentum (image 51)  and in the
ventral peritoneal space (image 49.  This consistent with
perforated diverticulitis.  Suspect the lesion is within the
sigmoid colon.  There is a focus of inflammation along the
descending colon additionally (image 56).  The abscess in the most
inferior pelvis measures 5.1 by a simple 7.1 cm.  The abscess in
the left lower quadrant along the iliac vessels measures 5.3 x
cm.

There are bilateral pleural effusions.  There is bibasilar
atelectasis.  No pericardial fluid.

Small amount gas along the anterior hepatic margin and in the porta
hepatis.  No focal hepatic lesion.  The gallbladder, pancreas,
spleen, adrenal glands are normal.

The stomach, small bowel and cecum are normal.  No evidence of
small bowel obstruction.  The contrast flows into the rectum
without evidence of frank extravasation.
IMPRESSION: 1..  Acute diverticulitis with abscess formation in the posterior
cul-de-sac and left pelvis.
2. Bowel perforation with small to moderate volume intraperitoneal
free air.
3.  Site of perforation is likely within the sigmoid colon but
there is a second focus of potential diverticulitis of the
descending colon.

Findings conveyed to Speedy Faustino of surgery on 09/19/2012 at 0070
hours

## 2013-12-24 DIAGNOSIS — H01029 Squamous blepharitis unspecified eye, unspecified eyelid: Secondary | ICD-10-CM | POA: Diagnosis not present

## 2013-12-24 DIAGNOSIS — H01119 Allergic dermatitis of unspecified eye, unspecified eyelid: Secondary | ICD-10-CM | POA: Diagnosis not present

## 2014-01-26 DIAGNOSIS — L919 Hypertrophic disorder of the skin, unspecified: Secondary | ICD-10-CM | POA: Diagnosis not present

## 2014-01-26 DIAGNOSIS — D2370 Other benign neoplasm of skin of unspecified lower limb, including hip: Secondary | ICD-10-CM | POA: Diagnosis not present

## 2014-01-26 DIAGNOSIS — L909 Atrophic disorder of skin, unspecified: Secondary | ICD-10-CM | POA: Diagnosis not present

## 2014-01-26 DIAGNOSIS — D239 Other benign neoplasm of skin, unspecified: Secondary | ICD-10-CM | POA: Diagnosis not present

## 2014-01-26 DIAGNOSIS — L821 Other seborrheic keratosis: Secondary | ICD-10-CM | POA: Diagnosis not present

## 2014-01-26 DIAGNOSIS — L820 Inflamed seborrheic keratosis: Secondary | ICD-10-CM | POA: Diagnosis not present

## 2014-01-26 DIAGNOSIS — L570 Actinic keratosis: Secondary | ICD-10-CM | POA: Diagnosis not present

## 2014-02-22 DIAGNOSIS — H43399 Other vitreous opacities, unspecified eye: Secondary | ICD-10-CM | POA: Diagnosis not present

## 2014-02-22 DIAGNOSIS — H43819 Vitreous degeneration, unspecified eye: Secondary | ICD-10-CM | POA: Diagnosis not present

## 2014-02-23 DIAGNOSIS — Z79899 Other long term (current) drug therapy: Secondary | ICD-10-CM | POA: Diagnosis not present

## 2014-02-23 DIAGNOSIS — F329 Major depressive disorder, single episode, unspecified: Secondary | ICD-10-CM | POA: Diagnosis not present

## 2014-02-23 DIAGNOSIS — J309 Allergic rhinitis, unspecified: Secondary | ICD-10-CM | POA: Diagnosis not present

## 2014-02-23 DIAGNOSIS — R7309 Other abnormal glucose: Secondary | ICD-10-CM | POA: Diagnosis not present

## 2014-02-23 DIAGNOSIS — E7800 Pure hypercholesterolemia, unspecified: Secondary | ICD-10-CM | POA: Diagnosis not present

## 2014-02-23 DIAGNOSIS — E559 Vitamin D deficiency, unspecified: Secondary | ICD-10-CM | POA: Diagnosis not present

## 2014-02-23 DIAGNOSIS — Z0000 Encounter for general adult medical examination without abnormal findings: Secondary | ICD-10-CM | POA: Diagnosis not present

## 2014-02-23 DIAGNOSIS — I10 Essential (primary) hypertension: Secondary | ICD-10-CM | POA: Diagnosis not present

## 2014-02-26 DIAGNOSIS — R7309 Other abnormal glucose: Secondary | ICD-10-CM | POA: Diagnosis not present

## 2014-02-26 DIAGNOSIS — N182 Chronic kidney disease, stage 2 (mild): Secondary | ICD-10-CM | POA: Diagnosis not present

## 2014-02-26 DIAGNOSIS — E1129 Type 2 diabetes mellitus with other diabetic kidney complication: Secondary | ICD-10-CM | POA: Diagnosis not present

## 2014-03-09 DIAGNOSIS — H2510 Age-related nuclear cataract, unspecified eye: Secondary | ICD-10-CM | POA: Diagnosis not present

## 2014-03-09 DIAGNOSIS — H43819 Vitreous degeneration, unspecified eye: Secondary | ICD-10-CM | POA: Diagnosis not present

## 2014-04-27 DIAGNOSIS — E119 Type 2 diabetes mellitus without complications: Secondary | ICD-10-CM | POA: Diagnosis not present

## 2014-04-27 DIAGNOSIS — H43819 Vitreous degeneration, unspecified eye: Secondary | ICD-10-CM | POA: Diagnosis not present

## 2014-05-03 DIAGNOSIS — Z23 Encounter for immunization: Secondary | ICD-10-CM | POA: Diagnosis not present

## 2014-05-13 DIAGNOSIS — L820 Inflamed seborrheic keratosis: Secondary | ICD-10-CM | POA: Diagnosis not present

## 2014-05-13 DIAGNOSIS — L821 Other seborrheic keratosis: Secondary | ICD-10-CM | POA: Diagnosis not present

## 2014-05-25 DIAGNOSIS — E1122 Type 2 diabetes mellitus with diabetic chronic kidney disease: Secondary | ICD-10-CM | POA: Diagnosis not present

## 2014-05-25 DIAGNOSIS — E780 Pure hypercholesterolemia: Secondary | ICD-10-CM | POA: Diagnosis not present

## 2014-05-25 DIAGNOSIS — N182 Chronic kidney disease, stage 2 (mild): Secondary | ICD-10-CM | POA: Diagnosis not present

## 2014-06-02 DIAGNOSIS — H43393 Other vitreous opacities, bilateral: Secondary | ICD-10-CM | POA: Diagnosis not present

## 2014-08-17 DIAGNOSIS — M65332 Trigger finger, left middle finger: Secondary | ICD-10-CM | POA: Diagnosis not present

## 2014-09-21 DIAGNOSIS — M65332 Trigger finger, left middle finger: Secondary | ICD-10-CM | POA: Diagnosis not present

## 2014-12-10 DIAGNOSIS — L820 Inflamed seborrheic keratosis: Secondary | ICD-10-CM | POA: Diagnosis not present

## 2014-12-10 DIAGNOSIS — B078 Other viral warts: Secondary | ICD-10-CM | POA: Diagnosis not present

## 2014-12-23 DIAGNOSIS — Z1231 Encounter for screening mammogram for malignant neoplasm of breast: Secondary | ICD-10-CM | POA: Diagnosis not present

## 2015-01-14 DIAGNOSIS — M65332 Trigger finger, left middle finger: Secondary | ICD-10-CM | POA: Diagnosis not present

## 2015-01-27 DIAGNOSIS — D225 Melanocytic nevi of trunk: Secondary | ICD-10-CM | POA: Diagnosis not present

## 2015-01-27 DIAGNOSIS — D1801 Hemangioma of skin and subcutaneous tissue: Secondary | ICD-10-CM | POA: Diagnosis not present

## 2015-01-27 DIAGNOSIS — L821 Other seborrheic keratosis: Secondary | ICD-10-CM | POA: Diagnosis not present

## 2015-01-27 DIAGNOSIS — L814 Other melanin hyperpigmentation: Secondary | ICD-10-CM | POA: Diagnosis not present

## 2015-01-27 DIAGNOSIS — D485 Neoplasm of uncertain behavior of skin: Secondary | ICD-10-CM | POA: Diagnosis not present

## 2015-01-27 DIAGNOSIS — Z85828 Personal history of other malignant neoplasm of skin: Secondary | ICD-10-CM | POA: Diagnosis not present

## 2015-01-27 DIAGNOSIS — D2271 Melanocytic nevi of right lower limb, including hip: Secondary | ICD-10-CM | POA: Diagnosis not present

## 2015-03-04 DIAGNOSIS — N182 Chronic kidney disease, stage 2 (mild): Secondary | ICD-10-CM | POA: Diagnosis not present

## 2015-03-04 DIAGNOSIS — E559 Vitamin D deficiency, unspecified: Secondary | ICD-10-CM | POA: Diagnosis not present

## 2015-03-04 DIAGNOSIS — E780 Pure hypercholesterolemia: Secondary | ICD-10-CM | POA: Diagnosis not present

## 2015-03-04 DIAGNOSIS — J309 Allergic rhinitis, unspecified: Secondary | ICD-10-CM | POA: Diagnosis not present

## 2015-03-04 DIAGNOSIS — Z79899 Other long term (current) drug therapy: Secondary | ICD-10-CM | POA: Diagnosis not present

## 2015-03-04 DIAGNOSIS — Z23 Encounter for immunization: Secondary | ICD-10-CM | POA: Diagnosis not present

## 2015-03-04 DIAGNOSIS — I10 Essential (primary) hypertension: Secondary | ICD-10-CM | POA: Diagnosis not present

## 2015-03-04 DIAGNOSIS — E1122 Type 2 diabetes mellitus with diabetic chronic kidney disease: Secondary | ICD-10-CM | POA: Diagnosis not present

## 2015-03-04 DIAGNOSIS — Z0000 Encounter for general adult medical examination without abnormal findings: Secondary | ICD-10-CM | POA: Diagnosis not present

## 2015-04-04 DIAGNOSIS — M810 Age-related osteoporosis without current pathological fracture: Secondary | ICD-10-CM | POA: Diagnosis not present

## 2015-04-19 DIAGNOSIS — L820 Inflamed seborrheic keratosis: Secondary | ICD-10-CM | POA: Diagnosis not present

## 2015-04-19 DIAGNOSIS — Z85828 Personal history of other malignant neoplasm of skin: Secondary | ICD-10-CM | POA: Diagnosis not present

## 2015-04-26 DIAGNOSIS — E119 Type 2 diabetes mellitus without complications: Secondary | ICD-10-CM | POA: Diagnosis not present

## 2015-05-03 DIAGNOSIS — Z23 Encounter for immunization: Secondary | ICD-10-CM | POA: Diagnosis not present

## 2015-06-06 DIAGNOSIS — H2513 Age-related nuclear cataract, bilateral: Secondary | ICD-10-CM | POA: Diagnosis not present

## 2015-09-12 DIAGNOSIS — M65332 Trigger finger, left middle finger: Secondary | ICD-10-CM | POA: Diagnosis not present

## 2015-09-12 DIAGNOSIS — M7712 Lateral epicondylitis, left elbow: Secondary | ICD-10-CM | POA: Diagnosis not present

## 2015-10-10 DIAGNOSIS — M65332 Trigger finger, left middle finger: Secondary | ICD-10-CM | POA: Diagnosis not present

## 2015-10-10 DIAGNOSIS — M7712 Lateral epicondylitis, left elbow: Secondary | ICD-10-CM | POA: Diagnosis not present

## 2015-11-14 DIAGNOSIS — M65332 Trigger finger, left middle finger: Secondary | ICD-10-CM | POA: Diagnosis not present

## 2015-11-14 DIAGNOSIS — M7712 Lateral epicondylitis, left elbow: Secondary | ICD-10-CM | POA: Diagnosis not present

## 2015-12-12 DIAGNOSIS — M7712 Lateral epicondylitis, left elbow: Secondary | ICD-10-CM | POA: Diagnosis not present

## 2015-12-12 DIAGNOSIS — M65332 Trigger finger, left middle finger: Secondary | ICD-10-CM | POA: Diagnosis not present

## 2015-12-26 DIAGNOSIS — Z1231 Encounter for screening mammogram for malignant neoplasm of breast: Secondary | ICD-10-CM | POA: Diagnosis not present

## 2015-12-28 DIAGNOSIS — H16142 Punctate keratitis, left eye: Secondary | ICD-10-CM | POA: Diagnosis not present

## 2016-01-20 DIAGNOSIS — M7712 Lateral epicondylitis, left elbow: Secondary | ICD-10-CM | POA: Diagnosis not present

## 2016-01-20 DIAGNOSIS — M65332 Trigger finger, left middle finger: Secondary | ICD-10-CM | POA: Diagnosis not present

## 2016-01-30 DIAGNOSIS — Z4789 Encounter for other orthopedic aftercare: Secondary | ICD-10-CM | POA: Diagnosis not present

## 2016-01-30 DIAGNOSIS — M65332 Trigger finger, left middle finger: Secondary | ICD-10-CM | POA: Diagnosis not present

## 2016-02-27 DIAGNOSIS — H16142 Punctate keratitis, left eye: Secondary | ICD-10-CM | POA: Diagnosis not present

## 2016-02-27 DIAGNOSIS — Z4789 Encounter for other orthopedic aftercare: Secondary | ICD-10-CM | POA: Diagnosis not present

## 2016-02-27 DIAGNOSIS — M65332 Trigger finger, left middle finger: Secondary | ICD-10-CM | POA: Diagnosis not present

## 2016-02-28 DIAGNOSIS — L814 Other melanin hyperpigmentation: Secondary | ICD-10-CM | POA: Diagnosis not present

## 2016-02-28 DIAGNOSIS — L821 Other seborrheic keratosis: Secondary | ICD-10-CM | POA: Diagnosis not present

## 2016-02-28 DIAGNOSIS — D2271 Melanocytic nevi of right lower limb, including hip: Secondary | ICD-10-CM | POA: Diagnosis not present

## 2016-02-28 DIAGNOSIS — Z85828 Personal history of other malignant neoplasm of skin: Secondary | ICD-10-CM | POA: Diagnosis not present

## 2016-02-28 DIAGNOSIS — L738 Other specified follicular disorders: Secondary | ICD-10-CM | POA: Diagnosis not present

## 2016-02-28 DIAGNOSIS — D1801 Hemangioma of skin and subcutaneous tissue: Secondary | ICD-10-CM | POA: Diagnosis not present

## 2016-03-05 DIAGNOSIS — E559 Vitamin D deficiency, unspecified: Secondary | ICD-10-CM | POA: Diagnosis not present

## 2016-03-05 DIAGNOSIS — E1122 Type 2 diabetes mellitus with diabetic chronic kidney disease: Secondary | ICD-10-CM | POA: Diagnosis not present

## 2016-03-05 DIAGNOSIS — Z79899 Other long term (current) drug therapy: Secondary | ICD-10-CM | POA: Diagnosis not present

## 2016-03-05 DIAGNOSIS — I10 Essential (primary) hypertension: Secondary | ICD-10-CM | POA: Diagnosis not present

## 2016-03-05 DIAGNOSIS — E1165 Type 2 diabetes mellitus with hyperglycemia: Secondary | ICD-10-CM | POA: Diagnosis not present

## 2016-03-05 DIAGNOSIS — Z0000 Encounter for general adult medical examination without abnormal findings: Secondary | ICD-10-CM | POA: Diagnosis not present

## 2016-03-05 DIAGNOSIS — E7800 Pure hypercholesterolemia, unspecified: Secondary | ICD-10-CM | POA: Diagnosis not present

## 2016-03-05 DIAGNOSIS — N182 Chronic kidney disease, stage 2 (mild): Secondary | ICD-10-CM | POA: Diagnosis not present

## 2016-03-05 DIAGNOSIS — J309 Allergic rhinitis, unspecified: Secondary | ICD-10-CM | POA: Diagnosis not present

## 2016-03-30 DIAGNOSIS — H2513 Age-related nuclear cataract, bilateral: Secondary | ICD-10-CM | POA: Diagnosis not present

## 2016-04-12 ENCOUNTER — Other Ambulatory Visit: Payer: Self-pay

## 2016-05-04 DIAGNOSIS — Z23 Encounter for immunization: Secondary | ICD-10-CM | POA: Diagnosis not present

## 2016-05-07 DIAGNOSIS — H43811 Vitreous degeneration, right eye: Secondary | ICD-10-CM | POA: Diagnosis not present

## 2016-05-07 DIAGNOSIS — H2511 Age-related nuclear cataract, right eye: Secondary | ICD-10-CM | POA: Diagnosis not present

## 2016-05-07 DIAGNOSIS — H43812 Vitreous degeneration, left eye: Secondary | ICD-10-CM | POA: Diagnosis not present

## 2016-05-07 DIAGNOSIS — E119 Type 2 diabetes mellitus without complications: Secondary | ICD-10-CM | POA: Diagnosis not present

## 2016-07-11 DIAGNOSIS — H2512 Age-related nuclear cataract, left eye: Secondary | ICD-10-CM | POA: Diagnosis not present

## 2016-07-11 DIAGNOSIS — H35033 Hypertensive retinopathy, bilateral: Secondary | ICD-10-CM | POA: Diagnosis not present

## 2016-07-11 DIAGNOSIS — H25013 Cortical age-related cataract, bilateral: Secondary | ICD-10-CM | POA: Diagnosis not present

## 2016-07-11 DIAGNOSIS — H2513 Age-related nuclear cataract, bilateral: Secondary | ICD-10-CM | POA: Diagnosis not present

## 2016-08-28 DIAGNOSIS — H2512 Age-related nuclear cataract, left eye: Secondary | ICD-10-CM | POA: Diagnosis not present

## 2016-08-29 DIAGNOSIS — H2511 Age-related nuclear cataract, right eye: Secondary | ICD-10-CM | POA: Diagnosis not present

## 2016-09-04 DIAGNOSIS — H2511 Age-related nuclear cataract, right eye: Secondary | ICD-10-CM | POA: Diagnosis not present

## 2016-11-20 DIAGNOSIS — H20023 Recurrent acute iridocyclitis, bilateral: Secondary | ICD-10-CM | POA: Diagnosis not present

## 2016-12-10 DIAGNOSIS — L309 Dermatitis, unspecified: Secondary | ICD-10-CM | POA: Diagnosis not present

## 2016-12-10 DIAGNOSIS — Z85828 Personal history of other malignant neoplasm of skin: Secondary | ICD-10-CM | POA: Diagnosis not present

## 2017-01-03 DIAGNOSIS — Z1231 Encounter for screening mammogram for malignant neoplasm of breast: Secondary | ICD-10-CM | POA: Diagnosis not present

## 2017-02-26 DIAGNOSIS — L738 Other specified follicular disorders: Secondary | ICD-10-CM | POA: Diagnosis not present

## 2017-02-26 DIAGNOSIS — L821 Other seborrheic keratosis: Secondary | ICD-10-CM | POA: Diagnosis not present

## 2017-02-26 DIAGNOSIS — D2271 Melanocytic nevi of right lower limb, including hip: Secondary | ICD-10-CM | POA: Diagnosis not present

## 2017-02-26 DIAGNOSIS — D1801 Hemangioma of skin and subcutaneous tissue: Secondary | ICD-10-CM | POA: Diagnosis not present

## 2017-02-26 DIAGNOSIS — L814 Other melanin hyperpigmentation: Secondary | ICD-10-CM | POA: Diagnosis not present

## 2017-02-26 DIAGNOSIS — L570 Actinic keratosis: Secondary | ICD-10-CM | POA: Diagnosis not present

## 2017-02-26 DIAGNOSIS — Z85828 Personal history of other malignant neoplasm of skin: Secondary | ICD-10-CM | POA: Diagnosis not present

## 2017-03-25 DIAGNOSIS — Z0000 Encounter for general adult medical examination without abnormal findings: Secondary | ICD-10-CM | POA: Diagnosis not present

## 2017-03-25 DIAGNOSIS — E7800 Pure hypercholesterolemia, unspecified: Secondary | ICD-10-CM | POA: Diagnosis not present

## 2017-03-25 DIAGNOSIS — I10 Essential (primary) hypertension: Secondary | ICD-10-CM | POA: Diagnosis not present

## 2017-03-25 DIAGNOSIS — Z79899 Other long term (current) drug therapy: Secondary | ICD-10-CM | POA: Diagnosis not present

## 2017-03-25 DIAGNOSIS — F324 Major depressive disorder, single episode, in partial remission: Secondary | ICD-10-CM | POA: Diagnosis not present

## 2017-03-25 DIAGNOSIS — E559 Vitamin D deficiency, unspecified: Secondary | ICD-10-CM | POA: Diagnosis not present

## 2017-03-25 DIAGNOSIS — E1122 Type 2 diabetes mellitus with diabetic chronic kidney disease: Secondary | ICD-10-CM | POA: Diagnosis not present

## 2017-05-06 DIAGNOSIS — H43812 Vitreous degeneration, left eye: Secondary | ICD-10-CM | POA: Diagnosis not present

## 2017-05-06 DIAGNOSIS — H43811 Vitreous degeneration, right eye: Secondary | ICD-10-CM | POA: Diagnosis not present

## 2017-05-06 DIAGNOSIS — E119 Type 2 diabetes mellitus without complications: Secondary | ICD-10-CM | POA: Diagnosis not present

## 2017-05-21 DIAGNOSIS — Z23 Encounter for immunization: Secondary | ICD-10-CM | POA: Diagnosis not present

## 2017-08-28 DIAGNOSIS — H16223 Keratoconjunctivitis sicca, not specified as Sjogren's, bilateral: Secondary | ICD-10-CM | POA: Diagnosis not present

## 2017-08-30 DIAGNOSIS — L821 Other seborrheic keratosis: Secondary | ICD-10-CM | POA: Diagnosis not present

## 2017-08-30 DIAGNOSIS — Z85828 Personal history of other malignant neoplasm of skin: Secondary | ICD-10-CM | POA: Diagnosis not present

## 2017-11-18 DIAGNOSIS — H04123 Dry eye syndrome of bilateral lacrimal glands: Secondary | ICD-10-CM | POA: Diagnosis not present

## 2018-01-06 DIAGNOSIS — Z1231 Encounter for screening mammogram for malignant neoplasm of breast: Secondary | ICD-10-CM | POA: Diagnosis not present

## 2018-04-10 DIAGNOSIS — Z85828 Personal history of other malignant neoplasm of skin: Secondary | ICD-10-CM | POA: Diagnosis not present

## 2018-04-10 DIAGNOSIS — L820 Inflamed seborrheic keratosis: Secondary | ICD-10-CM | POA: Diagnosis not present

## 2018-04-24 DIAGNOSIS — F324 Major depressive disorder, single episode, in partial remission: Secondary | ICD-10-CM | POA: Diagnosis not present

## 2018-04-24 DIAGNOSIS — E559 Vitamin D deficiency, unspecified: Secondary | ICD-10-CM | POA: Diagnosis not present

## 2018-04-24 DIAGNOSIS — Z0000 Encounter for general adult medical examination without abnormal findings: Secondary | ICD-10-CM | POA: Diagnosis not present

## 2018-04-24 DIAGNOSIS — Z79899 Other long term (current) drug therapy: Secondary | ICD-10-CM | POA: Diagnosis not present

## 2018-04-24 DIAGNOSIS — I129 Hypertensive chronic kidney disease with stage 1 through stage 4 chronic kidney disease, or unspecified chronic kidney disease: Secondary | ICD-10-CM | POA: Diagnosis not present

## 2018-04-24 DIAGNOSIS — Z1211 Encounter for screening for malignant neoplasm of colon: Secondary | ICD-10-CM | POA: Diagnosis not present

## 2018-04-24 DIAGNOSIS — Z9049 Acquired absence of other specified parts of digestive tract: Secondary | ICD-10-CM | POA: Diagnosis not present

## 2018-04-24 DIAGNOSIS — N182 Chronic kidney disease, stage 2 (mild): Secondary | ICD-10-CM | POA: Diagnosis not present

## 2018-04-24 DIAGNOSIS — E7800 Pure hypercholesterolemia, unspecified: Secondary | ICD-10-CM | POA: Diagnosis not present

## 2018-04-24 DIAGNOSIS — E1122 Type 2 diabetes mellitus with diabetic chronic kidney disease: Secondary | ICD-10-CM | POA: Diagnosis not present

## 2018-04-30 DIAGNOSIS — E1122 Type 2 diabetes mellitus with diabetic chronic kidney disease: Secondary | ICD-10-CM | POA: Diagnosis not present

## 2018-05-01 DIAGNOSIS — D1801 Hemangioma of skin and subcutaneous tissue: Secondary | ICD-10-CM | POA: Diagnosis not present

## 2018-05-01 DIAGNOSIS — L814 Other melanin hyperpigmentation: Secondary | ICD-10-CM | POA: Diagnosis not present

## 2018-05-01 DIAGNOSIS — L821 Other seborrheic keratosis: Secondary | ICD-10-CM | POA: Diagnosis not present

## 2018-05-01 DIAGNOSIS — L84 Corns and callosities: Secondary | ICD-10-CM | POA: Diagnosis not present

## 2018-05-01 DIAGNOSIS — Z85828 Personal history of other malignant neoplasm of skin: Secondary | ICD-10-CM | POA: Diagnosis not present

## 2018-05-01 DIAGNOSIS — D2271 Melanocytic nevi of right lower limb, including hip: Secondary | ICD-10-CM | POA: Diagnosis not present

## 2018-05-06 DIAGNOSIS — Z23 Encounter for immunization: Secondary | ICD-10-CM | POA: Diagnosis not present

## 2018-05-12 DIAGNOSIS — Z961 Presence of intraocular lens: Secondary | ICD-10-CM | POA: Diagnosis not present

## 2018-05-12 DIAGNOSIS — H43811 Vitreous degeneration, right eye: Secondary | ICD-10-CM | POA: Diagnosis not present

## 2018-05-12 DIAGNOSIS — H43812 Vitreous degeneration, left eye: Secondary | ICD-10-CM | POA: Diagnosis not present

## 2018-05-12 DIAGNOSIS — E119 Type 2 diabetes mellitus without complications: Secondary | ICD-10-CM | POA: Diagnosis not present

## 2018-06-16 DIAGNOSIS — M810 Age-related osteoporosis without current pathological fracture: Secondary | ICD-10-CM | POA: Diagnosis not present

## 2018-06-16 DIAGNOSIS — E2839 Other primary ovarian failure: Secondary | ICD-10-CM | POA: Diagnosis not present

## 2018-08-21 DIAGNOSIS — L821 Other seborrheic keratosis: Secondary | ICD-10-CM | POA: Diagnosis not present

## 2018-08-21 DIAGNOSIS — Z85828 Personal history of other malignant neoplasm of skin: Secondary | ICD-10-CM | POA: Diagnosis not present

## 2018-09-29 DIAGNOSIS — H00022 Hordeolum internum right lower eyelid: Secondary | ICD-10-CM | POA: Diagnosis not present

## 2018-10-20 ENCOUNTER — Emergency Department (HOSPITAL_COMMUNITY)
Admission: EM | Admit: 2018-10-20 | Discharge: 2018-10-20 | Disposition: A | Discharge status: Home / Self Care | Payer: Medicare Other | Attending: Emergency Medicine | Admitting: Emergency Medicine

## 2018-10-20 ENCOUNTER — Emergency Department (HOSPITAL_COMMUNITY): Payer: Medicare Other

## 2018-10-20 ENCOUNTER — Other Ambulatory Visit: Payer: Self-pay

## 2018-10-20 ENCOUNTER — Encounter (HOSPITAL_COMMUNITY): Payer: Self-pay

## 2018-10-20 DIAGNOSIS — I10 Essential (primary) hypertension: Secondary | ICD-10-CM | POA: Insufficient documentation

## 2018-10-20 DIAGNOSIS — Z79899 Other long term (current) drug therapy: Secondary | ICD-10-CM | POA: Diagnosis not present

## 2018-10-20 DIAGNOSIS — R52 Pain, unspecified: Secondary | ICD-10-CM | POA: Diagnosis not present

## 2018-10-20 DIAGNOSIS — R110 Nausea: Secondary | ICD-10-CM | POA: Diagnosis not present

## 2018-10-20 DIAGNOSIS — Z91040 Latex allergy status: Secondary | ICD-10-CM | POA: Insufficient documentation

## 2018-10-20 DIAGNOSIS — N202 Calculus of kidney with calculus of ureter: Secondary | ICD-10-CM | POA: Diagnosis not present

## 2018-10-20 DIAGNOSIS — Z85828 Personal history of other malignant neoplasm of skin: Secondary | ICD-10-CM | POA: Insufficient documentation

## 2018-10-20 DIAGNOSIS — R109 Unspecified abdominal pain: Secondary | ICD-10-CM | POA: Diagnosis not present

## 2018-10-20 DIAGNOSIS — N201 Calculus of ureter: Secondary | ICD-10-CM | POA: Diagnosis not present

## 2018-10-20 DIAGNOSIS — N131 Hydronephrosis with ureteral stricture, not elsewhere classified: Secondary | ICD-10-CM | POA: Diagnosis not present

## 2018-10-20 DIAGNOSIS — R112 Nausea with vomiting, unspecified: Secondary | ICD-10-CM | POA: Diagnosis not present

## 2018-10-20 LAB — CBC WITH DIFFERENTIAL/PLATELET
ABS IMMATURE GRANULOCYTES: 0.05 10*3/uL (ref 0.00–0.07)
BASOS PCT: 1 %
Basophils Absolute: 0.1 10*3/uL (ref 0.0–0.1)
EOS ABS: 0 10*3/uL (ref 0.0–0.5)
Eosinophils Relative: 0 %
HCT: 41.7 % (ref 36.0–46.0)
Hemoglobin: 14 g/dL (ref 12.0–15.0)
IMMATURE GRANULOCYTES: 0 %
Lymphocytes Relative: 10 %
Lymphs Abs: 1.3 10*3/uL (ref 0.7–4.0)
MCH: 32.4 pg (ref 26.0–34.0)
MCHC: 33.6 g/dL (ref 30.0–36.0)
MCV: 96.5 fL (ref 80.0–100.0)
MONOS PCT: 4 %
Monocytes Absolute: 0.6 10*3/uL (ref 0.1–1.0)
NEUTROS PCT: 85 %
NRBC: 0 % (ref 0.0–0.2)
Neutro Abs: 11.2 10*3/uL — ABNORMAL HIGH (ref 1.7–7.7)
PLATELETS: 187 10*3/uL (ref 150–400)
RBC: 4.32 MIL/uL (ref 3.87–5.11)
RDW: 12.6 % (ref 11.5–15.5)
WBC: 13.2 10*3/uL — AB (ref 4.0–10.5)

## 2018-10-20 LAB — COMPREHENSIVE METABOLIC PANEL
ALT: 26 U/L (ref 0–44)
AST: 27 U/L (ref 15–41)
Albumin: 4.3 g/dL (ref 3.5–5.0)
Alkaline Phosphatase: 64 U/L (ref 38–126)
Anion gap: 11 (ref 5–15)
BUN: 17 mg/dL (ref 8–23)
CALCIUM: 9.3 mg/dL (ref 8.9–10.3)
CO2: 26 mmol/L (ref 22–32)
Chloride: 106 mmol/L (ref 98–111)
Creatinine, Ser: 0.98 mg/dL (ref 0.44–1.00)
GFR calc Af Amer: >60 mL/min (ref >60)
GFR, EST NON AFRICAN AMERICAN: 56 mL/min — AB (ref >60)
GLUCOSE: 163 mg/dL — AB (ref 70–99)
Potassium: 3.6 mmol/L (ref 3.5–5.1)
SODIUM: 143 mmol/L (ref 135–145)
TOTAL PROTEIN: 6.6 g/dL (ref 6.5–8.1)
Total Bilirubin: 0.5 mg/dL (ref 0.3–1.2)

## 2018-10-20 LAB — URINALYSIS, ROUTINE W REFLEX MICROSCOPIC
Bilirubin Urine: NEGATIVE
GLUCOSE, UA: NEGATIVE mg/dL
Ketones, ur: 20 mg/dL — AB
Leukocytes,Ua: NEGATIVE
Nitrite: NEGATIVE
PROTEIN: NEGATIVE mg/dL
Specific Gravity, Urine: 1.012 (ref 1.005–1.030)
pH: 8 (ref 5.0–8.0)

## 2018-10-20 MED ORDER — HYDROCODONE-ACETAMINOPHEN 5-325 MG PO TABS: 1.0000 | ORAL_TABLET | ORAL | 0 refills | Status: DC | PRN

## 2018-10-20 MED ORDER — ONDANSETRON HCL 4 MG/2ML IJ SOLN
4.0000 mg | Freq: Once | INTRAMUSCULAR | Status: AC
  Administered 2018-10-20: 4 mg via INTRAVENOUS
  Filled 2018-10-20: qty 2

## 2018-10-20 MED ORDER — FENTANYL CITRATE (PF) 100 MCG/2ML IJ SOLN
50.0000 ug | Freq: Once | INTRAMUSCULAR | Status: AC
  Administered 2018-10-20: 50 ug via INTRAVENOUS
  Filled 2018-10-20: qty 2

## 2018-10-20 MED ORDER — SODIUM CHLORIDE 0.9 % IV BOLUS
1000.0000 mL | Freq: Once | INTRAVENOUS | Status: AC
  Administered 2018-10-20: 1000 mL via INTRAVENOUS

## 2018-10-20 MED ORDER — ONDANSETRON 4 MG PO TBDP: 4.0000 mg | ORAL_TABLET | Freq: Three times a day (TID) | ORAL | 0 refills | Status: DC | PRN

## 2018-10-20 NOTE — ED Triage Notes (Signed)
Per EMS, PT has left flank pain, dysuria since Friday evening. Pain increasing since then, with intermittent nausea.

## 2018-10-20 NOTE — ED Notes (Signed)
Pure wick has been placed. Pt and family has been educated. Suction set to 65mmHg  Pt is complaining of flank pain while urinating

## 2018-10-20 NOTE — Discharge Instructions (Addendum)
Take Norco as needed as prescribed for pain. Be sure to take Colace to prevent constipation. Take Zofran as needed as prescribed for nausea and vomiting. Follow-up with urology, call tomorrow to schedule an appointment.

## 2018-10-20 NOTE — ED Provider Notes (Signed)
[facility] DEPT Provider Note   CSN: [id number] Arrival date & time: 10/20/18  1531    History   Chief Complaint Chief Complaint  Patient presents with   Flank Pain   Nausea    HPI Alice Anthony is a 78 y.o. female.     78 year old female presents with complaint of left flank pain x3 days with nausea and vomiting.  Pain does not radiate, nothing makes pain worse, pain improved somewhat with a heating pad.  Patient denies changes in bowel or bladder habits, fevers, chills.  No history of kidney stones.  Patient has a history of diverticulitis with colostomy and reversal.  Patient states that she has been under a lot of stress taking care of her husband and the recent death of her brother.  Patient was given antiemetics and pain medications by EMS with limited relief.  No other complaints or concerns.     Past Medical History:  Diagnosis Date   Anxiety    Asthma    Hx: of   Cancer ([place])    Hx: of skin cancer   Diverticulosis    GERD (gastroesophageal reflux disease)    Hx: of   Hyperlipemia    Hypertension     Patient Active Problem List   Diagnosis Date Noted   Postoperative wound infection 01/05/2013   Hyperkalemia 09/21/2012   Acute diverticulitis 09/20/2012   Perforated bowel ([place]) 09/20/2012   Intra-abdominal abscess ([place]) 09/20/2012   Nausea and vomiting 09/14/2012   Abdominal pain 09/14/2012   Colon wall thickening 09/14/2012   Abnormal CT scan, pelvis 09/14/2012   Hypertension     Past Surgical History:  Procedure Laterality Date   ABDOMINAL SURGERY  09/19/2012   Ruptured Diverticuli, Ostomy Placed.   BREAST BIOPSY  06/2012   breast biopsy   COLONOSCOPY  03/2012   COLOSTOMY CLOSURE  12/25/2012   Dr Brantley Stage   COLOSTOMY CLOSURE N/A 12/25/2012   Procedure: COLOSTOMY CLOSURE;  Surgeon: Joyice Faster. Cornett, MD;  Location: [facility];  Service: General;  Laterality: N/A;   EYE SURGERY  10/19/1998   Hx: of  cyst removed from left eye   LAPAROTOMY N/A 09/19/2012   Procedure: EXPLORATORY LAPAROTOMY, sigmoid colectomy;  Surgeon: Joyice Faster. Cornett, MD;  Location: [facility];  Service: General;  Laterality: N/A;   SIGMOIDOSCOPY  01/11/1999   TONSILLECTOMY     TRIGGER FINGER RELEASE  12/25/2010   TUBAL LIGATION  10-16-1985     OB History   No obstetric history on file.      Home Medications    Prior to Admission medications   Medication Sig Start Date End Date Taking? Authorizing Provider  ALPRAZolam (XANAX) 0.25 MG tablet Take 0.125-0.25 mg by mouth daily as needed for sleep or anxiety.   Yes [provider]  amLODipine (NORVASC) 5 MG tablet Take 5 mg by mouth daily.   Yes [provider]  Ascorbic Acid (VITAMIN C) 1000 MG tablet Take 1,000 mg by mouth daily.   Yes [provider]  aspirin 81 MG tablet Take 81 mg by mouth daily.   Yes [provider]  calcium carbonate (TUMS - DOSED IN MG ELEMENTAL CALCIUM) 500 MG chewable tablet Chew 1 tablet by mouth 2 (two) times daily.   Yes [provider]  Calcium Carbonate-Vitamin D (CALCIUM PLUS VITAMIN D PO) Take 1 tablet by mouth 2 (two) times daily.   Yes [provider]  cetirizine (ZYRTEC) 10 MG tablet Take 10 mg  by mouth at bedtime. REQUIRED NIGHTLY.   Yes [provider]  Cholecalciferol (VITAMIN D3) 2000 UNITS capsule Take 2,000 Units by mouth daily.   Yes [provider]  FIBER ADULT GUMMIES PO Take 3 tablets by mouth daily.   Yes [provider]  fluticasone (FLONASE) 50 MCG/ACT nasal spray Place 2 sprays into the nose at bedtime. REQUIRED NIGHTLY   Yes [provider]  hydrOXYzine (ATARAX/VISTARIL) 10 MG tablet Take 10 mg by mouth at bedtime. REQUIRED NIGHTLY.   Yes [provider]  KRILL OIL PO Take 1 tablet by mouth daily.   Yes [provider]  Multiple Vitamin (MULTIVITAMIN WITH MINERALS) TABS Take 1 tablet by mouth 2 (two) times  daily.   Yes [provider]  Probiotic Product (ALIGN) 4 MG CAPS Take by mouth.   Yes [provider]  simvastatin (ZOCOR) 20 MG tablet Take 20 mg by mouth every evening.   Yes [provider]  HYDROcodone-acetaminophen (NORCO/VICODIN) 5-325 MG tablet Take 1 tablet by mouth every 4 (four) hours as needed. 10/20/18   Tacy Learn, PA-C  ondansetron (ZOFRAN ODT) 4 MG disintegrating tablet Take 1 tablet (4 mg total) by mouth every 8 (eight) hours as needed for nausea or vomiting. 10/20/18   Tacy Learn, PA-C  polyethylene glycol powder (GLYCOLAX) powder Take 17 g by mouth daily. Patient not taking: Reported on 10/20/2018 01/01/13   Erroll Luna, MD    Family History Family History  Problem Relation Age of Onset   Diabetes Father    Alzheimer's disease Father    Basal cell carcinoma Mother        Metastatic    Social History Social History   Tobacco Use   Smoking status: Never Smoker   Smokeless tobacco: Never Used  Substance Use Topics   Alcohol use: No   Drug use: No     Allergies   Vioxx [rofecoxib]; Codeine; Fosamax [alendronate sodium]; and Latex   Review of Systems Review of Systems  Constitutional: Negative for chills and fever.  Respiratory: Negative for shortness of breath.   Cardiovascular: Negative for chest pain.  Gastrointestinal: Positive for nausea and vomiting. Negative for abdominal pain, constipation and diarrhea.  Genitourinary: Positive for flank pain. Negative for dysuria, frequency and urgency.  Musculoskeletal: Positive for back pain.  Skin: Negative for rash and wound.  Allergic/Immunologic: Negative for immunocompromised state.  Neurological: Negative for weakness and headaches.  Hematological: Does not bruise/bleed easily.  Psychiatric/Behavioral: Negative for confusion.  All other systems reviewed and are negative.    Physical Exam Updated Vital Signs BP 135/62    Pulse 74    Temp 98.1 F (36.7 C)     Resp 15    Ht 5\' 2"  (1.575 m)    Wt 59.9 kg    SpO2 97%    BMI 24.14 kg/m   Physical Exam Vitals signs and nursing note reviewed.  Constitutional:      General: She is not in acute distress.    Appearance: She is well-developed. She is not diaphoretic.  HENT:     Head: Normocephalic and atraumatic.  Cardiovascular:     Rate and Rhythm: Normal rate and regular rhythm.     Pulses: Normal pulses.     Heart sounds: Normal heart sounds.  Pulmonary:     Effort: Pulmonary effort is normal.     Breath sounds: Normal breath sounds.  Abdominal:     Tenderness: There is no abdominal tenderness. There is  no right CVA tenderness or left CVA tenderness.  Musculoskeletal:        General: No tenderness.  Skin:    General: Skin is warm and dry.     Findings: No erythema or rash.  Neurological:     Mental Status: She is alert and oriented to person, place, and time.  Psychiatric:        Behavior: Behavior normal.      ED Treatments / Results  Labs (all labs ordered are listed, but only abnormal results are displayed) Labs Reviewed  URINALYSIS, ROUTINE W REFLEX MICROSCOPIC - Abnormal; Notable for the following components:      Result Value   APPearance TURBID (*)    Hgb urine dipstick SMALL (*)    Ketones, ur 20 (*)    Bacteria, UA RARE (*)    All other components within normal limits  CBC WITH DIFFERENTIAL/PLATELET - Abnormal; Notable for the following components:   WBC 13.2 (*)    Neutro Abs 11.2 (*)    All other components within normal limits  COMPREHENSIVE METABOLIC PANEL - Abnormal; Notable for the following components:   Glucose, Bld 163 (*)    GFR calc non Af Amer 56 (*)    All other components within normal limits    EKG None  Radiology Ct Renal Stone Study  Result Date: 10/20/2018 CLINICAL DATA:  Left flank pain EXAM: CT ABDOMEN AND PELVIS WITHOUT CONTRAST TECHNIQUE: Multidetector CT imaging of the abdomen and pelvis was performed following the standard protocol  without IV contrast. COMPARISON:  09/19/2012 FINDINGS: Lower chest: No acute abnormality. Hepatobiliary: Small gallstones.  Unremarkable liver. Pancreas: Unremarkable Spleen: Unremarkable Adrenals/Urinary Tract: Moderate left hydronephrosis and perinephric stranding. This is associated with a 5 mm left ureterovesical junction calculus. There is a soft tissue mass emanating from the bladder base at the left ureterovesical junction. It is smooth and may simply represent edema secondary to the obstructing calculus, however mass is not excluded. Right kidney is unremarkable. Adrenal glands are within normal limits. Stomach/Bowel: Stomach and small bowel are decompressed. Postoperative changes from sigmoidectomy. No obvious mass in the colon. Multiple previously visualized abscesses have resolved. Vascular/Lymphatic: Aortic atherosclerotic vascular calcifications. No evidence of aortic aneurysm. No abnormal adenopathy. Reproductive: Uterus is within normal limits. Adnexa are within normal limits. Other: No free fluid. Musculoskeletal: No vertebral compression deformity. Lipoma involving the right iliopsoas muscle is stable. IMPRESSION: 5 mm left UVJ calculus is associated with secondary findings of left ureteral obstruction. There is a soft tissue mass within the bladder at the left UVJ which may represent edema, however mass along the wall of the bladder can not be excluded. Correlate clinically as for the need for cystoscopy. Cholelithiasis. Status post sigmoidectomy.  Previous abscesses have resolved. Electronically Signed   By: Marybelle Killings M.D.   On: 10/20/2018 19:34    Procedures Procedures (including critical care time)  Medications Ordered in ED Medications  sodium chloride 0.9 % bolus 1,000 mL (1,000 mLs Intravenous New Bag/Given 10/20/18 1718)  fentaNYL (SUBLIMAZE) injection 50 mcg (50 mcg Intravenous Given 10/20/18 1717)  ondansetron (ZOFRAN) injection 4 mg (4 mg Intravenous Given 10/20/18 1823)      Initial Impression / Assessment and Plan / ED Course  I have reviewed the triage vital signs and the nursing notes.  Pertinent labs & imaging results that were available during my care of the patient were reviewed by me and considered in my medical decision making (see chart for details).  Clinical Course as  of Oct 19 1957  Mon Oct 20, 6259  [id number] 78 year old female presents with complaint of left flank pain.  There is no CVA tenderness, abdomen is soft and nontender, no musculoskeletal tenderness.  Differential diagnosis includes diverticulitis versus colitis versus kidney stone versus pyelonephritis versus UTI.  CBC with mild increase in white count at 13.2, CMP with normal renal function, urinalysis appears turbid with small hemoglobin and ketones, no evidence of urinary tract infection.  CT shows 5 mm left UVJ stone as well as abnormal findings at the bladder, possibly related to the kidney stone.  Discussed results with patient, recommend that she follow-up with urology who will follow her care with possible need for cystoscopy.  Patient and daughter verbalized understanding and plan of care.  Patient given prescription for Norco to take for pain as well as Zofran for her nausea and vomiting.  Return to ER for worsening or concerning symptoms.   [LM]    Clinical Course User Index [LM] Tacy Learn, PA-C   Final Clinical Impressions(s) / ED Diagnoses   Final diagnoses:  Ureteral calculus, left    ED Discharge Orders         Ordered    HYDROcodone-acetaminophen (NORCO/VICODIN) 5-325 MG tablet  Every 4 hours PRN     10/20/18 1955    ondansetron (ZOFRAN ODT) 4 MG disintegrating tablet  Every 8 hours PRN     10/20/18 1955           Tacy Learn, PA-C 10/20/18 1959    Margette Fast, MD 10/20/18 2354

## 2018-11-11 DIAGNOSIS — N23 Unspecified renal colic: Secondary | ICD-10-CM | POA: Diagnosis not present

## 2018-11-11 DIAGNOSIS — N201 Calculus of ureter: Secondary | ICD-10-CM | POA: Diagnosis not present

## 2018-11-12 ENCOUNTER — Other Ambulatory Visit: Payer: Self-pay | Admitting: Urology

## 2018-11-12 ENCOUNTER — Encounter (HOSPITAL_COMMUNITY): Payer: Self-pay | Admitting: General Practice

## 2018-11-17 ENCOUNTER — Ambulatory Visit (HOSPITAL_COMMUNITY): Payer: Medicare Other

## 2018-11-17 ENCOUNTER — Ambulatory Visit (HOSPITAL_COMMUNITY)
Admission: RE | Admit: 2018-11-17 | Discharge: 2018-11-17 | Disposition: A | Discharge status: Home / Self Care | Payer: Medicare Other | Attending: Urology | Admitting: Urology

## 2018-11-17 ENCOUNTER — Encounter (HOSPITAL_COMMUNITY)
Admission: RE | Disposition: A | Discharge status: Home / Self Care | Payer: Self-pay | Source: Home / Self Care | Attending: Urology

## 2018-11-17 ENCOUNTER — Encounter (HOSPITAL_COMMUNITY): Payer: Self-pay

## 2018-11-17 ENCOUNTER — Other Ambulatory Visit: Payer: Self-pay

## 2018-11-17 DIAGNOSIS — F419 Anxiety disorder, unspecified: Secondary | ICD-10-CM | POA: Diagnosis not present

## 2018-11-17 DIAGNOSIS — E119 Type 2 diabetes mellitus without complications: Secondary | ICD-10-CM | POA: Insufficient documentation

## 2018-11-17 DIAGNOSIS — Z7982 Long term (current) use of aspirin: Secondary | ICD-10-CM | POA: Insufficient documentation

## 2018-11-17 DIAGNOSIS — Z79899 Other long term (current) drug therapy: Secondary | ICD-10-CM | POA: Insufficient documentation

## 2018-11-17 DIAGNOSIS — J45909 Unspecified asthma, uncomplicated: Secondary | ICD-10-CM | POA: Insufficient documentation

## 2018-11-17 DIAGNOSIS — I10 Essential (primary) hypertension: Secondary | ICD-10-CM | POA: Insufficient documentation

## 2018-11-17 DIAGNOSIS — E7800 Pure hypercholesterolemia, unspecified: Secondary | ICD-10-CM | POA: Insufficient documentation

## 2018-11-17 DIAGNOSIS — N201 Calculus of ureter: Secondary | ICD-10-CM

## 2018-11-17 HISTORY — PX: EXTRACORPOREAL SHOCK WAVE LITHOTRIPSY: SHX1557

## 2018-11-17 HISTORY — DX: Personal history of urinary calculi: Z87.442

## 2018-11-17 SURGERY — LITHOTRIPSY, ESWL
Anesthesia: LOCAL | Laterality: Left

## 2018-11-17 MED ORDER — CIPROFLOXACIN HCL 500 MG PO TABS
500.0000 mg | ORAL_TABLET | ORAL | Status: AC
  Administered 2018-11-17: 09:00:00 500 mg via ORAL
  Filled 2018-11-17: qty 1

## 2018-11-17 MED ORDER — DIPHENHYDRAMINE HCL 25 MG PO CAPS
25.0000 mg | ORAL_CAPSULE | ORAL | Status: AC
  Administered 2018-11-17: 25 mg via ORAL
  Filled 2018-11-17: qty 1

## 2018-11-17 MED ORDER — SODIUM CHLORIDE 0.9 % IV SOLN
INTRAVENOUS | Status: DC
  Administered 2018-11-17: 09:00:00 via INTRAVENOUS

## 2018-11-17 MED ORDER — DIAZEPAM 5 MG PO TABS
10.0000 mg | ORAL_TABLET | ORAL | Status: AC
  Administered 2018-11-17: 10 mg via ORAL
  Filled 2018-11-17: qty 2

## 2018-11-17 NOTE — Discharge Instructions (Signed)

## 2018-11-18 ENCOUNTER — Encounter (HOSPITAL_COMMUNITY): Payer: Self-pay | Admitting: Urology

## 2018-12-01 DIAGNOSIS — N201 Calculus of ureter: Secondary | ICD-10-CM | POA: Diagnosis not present

## 2018-12-01 DIAGNOSIS — N200 Calculus of kidney: Secondary | ICD-10-CM | POA: Diagnosis not present

## 2019-01-16 DIAGNOSIS — M62838 Other muscle spasm: Secondary | ICD-10-CM | POA: Diagnosis not present

## 2019-01-16 DIAGNOSIS — F4321 Adjustment disorder with depressed mood: Secondary | ICD-10-CM | POA: Diagnosis not present

## 2019-01-16 DIAGNOSIS — F324 Major depressive disorder, single episode, in partial remission: Secondary | ICD-10-CM | POA: Diagnosis not present

## 2019-02-03 DIAGNOSIS — M25512 Pain in left shoulder: Secondary | ICD-10-CM | POA: Diagnosis not present

## 2019-02-03 DIAGNOSIS — M25511 Pain in right shoulder: Secondary | ICD-10-CM | POA: Diagnosis not present

## 2019-02-09 DIAGNOSIS — E1122 Type 2 diabetes mellitus with diabetic chronic kidney disease: Secondary | ICD-10-CM | POA: Diagnosis not present

## 2019-02-09 DIAGNOSIS — M75100 Unspecified rotator cuff tear or rupture of unspecified shoulder, not specified as traumatic: Secondary | ICD-10-CM | POA: Diagnosis not present

## 2019-02-09 DIAGNOSIS — I10 Essential (primary) hypertension: Secondary | ICD-10-CM | POA: Diagnosis not present

## 2019-02-09 DIAGNOSIS — N182 Chronic kidney disease, stage 2 (mild): Secondary | ICD-10-CM | POA: Diagnosis not present

## 2019-02-09 DIAGNOSIS — F324 Major depressive disorder, single episode, in partial remission: Secondary | ICD-10-CM | POA: Diagnosis not present

## 2019-02-19 DIAGNOSIS — M25511 Pain in right shoulder: Secondary | ICD-10-CM | POA: Diagnosis not present

## 2019-02-19 DIAGNOSIS — M25512 Pain in left shoulder: Secondary | ICD-10-CM | POA: Diagnosis not present

## 2019-02-23 DIAGNOSIS — M25511 Pain in right shoulder: Secondary | ICD-10-CM | POA: Diagnosis not present

## 2019-02-23 DIAGNOSIS — M25512 Pain in left shoulder: Secondary | ICD-10-CM | POA: Diagnosis not present

## 2019-02-25 DIAGNOSIS — M25512 Pain in left shoulder: Secondary | ICD-10-CM | POA: Diagnosis not present

## 2019-02-25 DIAGNOSIS — M25511 Pain in right shoulder: Secondary | ICD-10-CM | POA: Diagnosis not present

## 2019-02-26 DIAGNOSIS — E7800 Pure hypercholesterolemia, unspecified: Secondary | ICD-10-CM | POA: Diagnosis not present

## 2019-02-26 DIAGNOSIS — M791 Myalgia, unspecified site: Secondary | ICD-10-CM | POA: Diagnosis not present

## 2019-02-26 DIAGNOSIS — N390 Urinary tract infection, site not specified: Secondary | ICD-10-CM | POA: Diagnosis not present

## 2019-02-26 DIAGNOSIS — Z79899 Other long term (current) drug therapy: Secondary | ICD-10-CM | POA: Diagnosis not present

## 2019-02-26 DIAGNOSIS — E1122 Type 2 diabetes mellitus with diabetic chronic kidney disease: Secondary | ICD-10-CM | POA: Diagnosis not present

## 2019-02-26 DIAGNOSIS — E559 Vitamin D deficiency, unspecified: Secondary | ICD-10-CM | POA: Diagnosis not present

## 2019-02-26 DIAGNOSIS — Z7984 Long term (current) use of oral hypoglycemic drugs: Secondary | ICD-10-CM | POA: Diagnosis not present

## 2019-03-02 DIAGNOSIS — M75100 Unspecified rotator cuff tear or rupture of unspecified shoulder, not specified as traumatic: Secondary | ICD-10-CM | POA: Diagnosis not present

## 2019-03-02 DIAGNOSIS — M25511 Pain in right shoulder: Secondary | ICD-10-CM | POA: Diagnosis not present

## 2019-03-02 DIAGNOSIS — M25512 Pain in left shoulder: Secondary | ICD-10-CM | POA: Diagnosis not present

## 2019-03-04 DIAGNOSIS — M25511 Pain in right shoulder: Secondary | ICD-10-CM | POA: Diagnosis not present

## 2019-03-05 DIAGNOSIS — M25512 Pain in left shoulder: Secondary | ICD-10-CM | POA: Diagnosis not present

## 2019-03-05 DIAGNOSIS — M25511 Pain in right shoulder: Secondary | ICD-10-CM | POA: Diagnosis not present

## 2019-03-09 DIAGNOSIS — E1122 Type 2 diabetes mellitus with diabetic chronic kidney disease: Secondary | ICD-10-CM | POA: Diagnosis not present

## 2019-03-09 DIAGNOSIS — N182 Chronic kidney disease, stage 2 (mild): Secondary | ICD-10-CM | POA: Diagnosis not present

## 2019-03-09 DIAGNOSIS — M353 Polymyalgia rheumatica: Secondary | ICD-10-CM | POA: Diagnosis not present

## 2019-03-09 DIAGNOSIS — M75100 Unspecified rotator cuff tear or rupture of unspecified shoulder, not specified as traumatic: Secondary | ICD-10-CM | POA: Diagnosis not present

## 2019-03-10 DIAGNOSIS — M25511 Pain in right shoulder: Secondary | ICD-10-CM | POA: Diagnosis not present

## 2019-03-10 DIAGNOSIS — M25512 Pain in left shoulder: Secondary | ICD-10-CM | POA: Diagnosis not present

## 2019-03-11 DIAGNOSIS — M25512 Pain in left shoulder: Secondary | ICD-10-CM | POA: Diagnosis not present

## 2019-03-11 DIAGNOSIS — M25511 Pain in right shoulder: Secondary | ICD-10-CM | POA: Diagnosis not present

## 2019-03-16 DIAGNOSIS — M25512 Pain in left shoulder: Secondary | ICD-10-CM | POA: Diagnosis not present

## 2019-03-16 DIAGNOSIS — M25511 Pain in right shoulder: Secondary | ICD-10-CM | POA: Diagnosis not present

## 2019-03-18 DIAGNOSIS — M25511 Pain in right shoulder: Secondary | ICD-10-CM | POA: Diagnosis not present

## 2019-03-18 DIAGNOSIS — M25512 Pain in left shoulder: Secondary | ICD-10-CM | POA: Diagnosis not present

## 2019-03-23 DIAGNOSIS — M25511 Pain in right shoulder: Secondary | ICD-10-CM | POA: Diagnosis not present

## 2019-03-23 DIAGNOSIS — M25512 Pain in left shoulder: Secondary | ICD-10-CM | POA: Diagnosis not present

## 2019-03-25 DIAGNOSIS — M25511 Pain in right shoulder: Secondary | ICD-10-CM | POA: Diagnosis not present

## 2019-03-25 DIAGNOSIS — M25512 Pain in left shoulder: Secondary | ICD-10-CM | POA: Diagnosis not present

## 2019-03-30 DIAGNOSIS — M25511 Pain in right shoulder: Secondary | ICD-10-CM | POA: Diagnosis not present

## 2019-03-30 DIAGNOSIS — M25512 Pain in left shoulder: Secondary | ICD-10-CM | POA: Diagnosis not present

## 2019-04-08 DIAGNOSIS — M25511 Pain in right shoulder: Secondary | ICD-10-CM | POA: Diagnosis not present

## 2019-04-08 DIAGNOSIS — M25512 Pain in left shoulder: Secondary | ICD-10-CM | POA: Diagnosis not present

## 2019-04-09 DIAGNOSIS — N201 Calculus of ureter: Secondary | ICD-10-CM | POA: Diagnosis not present

## 2019-04-16 DIAGNOSIS — M25512 Pain in left shoulder: Secondary | ICD-10-CM | POA: Diagnosis not present

## 2019-04-16 DIAGNOSIS — M25511 Pain in right shoulder: Secondary | ICD-10-CM | POA: Diagnosis not present

## 2019-04-20 DIAGNOSIS — M25512 Pain in left shoulder: Secondary | ICD-10-CM | POA: Diagnosis not present

## 2019-04-20 DIAGNOSIS — M25511 Pain in right shoulder: Secondary | ICD-10-CM | POA: Diagnosis not present

## 2019-04-22 DIAGNOSIS — M25512 Pain in left shoulder: Secondary | ICD-10-CM | POA: Diagnosis not present

## 2019-04-22 DIAGNOSIS — M25511 Pain in right shoulder: Secondary | ICD-10-CM | POA: Diagnosis not present

## 2019-04-24 DIAGNOSIS — M25512 Pain in left shoulder: Secondary | ICD-10-CM | POA: Diagnosis not present

## 2019-04-24 DIAGNOSIS — M25511 Pain in right shoulder: Secondary | ICD-10-CM | POA: Diagnosis not present

## 2019-06-25 DIAGNOSIS — Z9049 Acquired absence of other specified parts of digestive tract: Secondary | ICD-10-CM | POA: Diagnosis not present

## 2019-06-25 DIAGNOSIS — Z0000 Encounter for general adult medical examination without abnormal findings: Secondary | ICD-10-CM | POA: Diagnosis not present

## 2019-06-25 DIAGNOSIS — M353 Polymyalgia rheumatica: Secondary | ICD-10-CM | POA: Diagnosis not present

## 2019-06-25 DIAGNOSIS — N182 Chronic kidney disease, stage 2 (mild): Secondary | ICD-10-CM | POA: Diagnosis not present

## 2019-06-25 DIAGNOSIS — M75100 Unspecified rotator cuff tear or rupture of unspecified shoulder, not specified as traumatic: Secondary | ICD-10-CM | POA: Diagnosis not present

## 2019-06-25 DIAGNOSIS — Z79899 Other long term (current) drug therapy: Secondary | ICD-10-CM | POA: Diagnosis not present

## 2019-06-25 DIAGNOSIS — E7800 Pure hypercholesterolemia, unspecified: Secondary | ICD-10-CM | POA: Diagnosis not present

## 2019-06-25 DIAGNOSIS — E1122 Type 2 diabetes mellitus with diabetic chronic kidney disease: Secondary | ICD-10-CM | POA: Diagnosis not present

## 2019-06-25 DIAGNOSIS — J309 Allergic rhinitis, unspecified: Secondary | ICD-10-CM | POA: Diagnosis not present

## 2019-06-25 DIAGNOSIS — I10 Essential (primary) hypertension: Secondary | ICD-10-CM | POA: Diagnosis not present

## 2019-06-25 DIAGNOSIS — E559 Vitamin D deficiency, unspecified: Secondary | ICD-10-CM | POA: Diagnosis not present

## 2019-06-25 DIAGNOSIS — Z23 Encounter for immunization: Secondary | ICD-10-CM | POA: Diagnosis not present

## 2019-12-03 DIAGNOSIS — D3131 Benign neoplasm of right choroid: Secondary | ICD-10-CM | POA: Diagnosis not present

## 2019-12-08 DIAGNOSIS — D2262 Melanocytic nevi of left upper limb, including shoulder: Secondary | ICD-10-CM | POA: Diagnosis not present

## 2019-12-08 DIAGNOSIS — D1801 Hemangioma of skin and subcutaneous tissue: Secondary | ICD-10-CM | POA: Diagnosis not present

## 2019-12-08 DIAGNOSIS — D2272 Melanocytic nevi of left lower limb, including hip: Secondary | ICD-10-CM | POA: Diagnosis not present

## 2019-12-08 DIAGNOSIS — D2271 Melanocytic nevi of right lower limb, including hip: Secondary | ICD-10-CM | POA: Diagnosis not present

## 2019-12-08 DIAGNOSIS — L821 Other seborrheic keratosis: Secondary | ICD-10-CM | POA: Diagnosis not present

## 2019-12-08 DIAGNOSIS — Z85828 Personal history of other malignant neoplasm of skin: Secondary | ICD-10-CM | POA: Diagnosis not present

## 2019-12-08 DIAGNOSIS — D2261 Melanocytic nevi of right upper limb, including shoulder: Secondary | ICD-10-CM | POA: Diagnosis not present

## 2019-12-08 DIAGNOSIS — L570 Actinic keratosis: Secondary | ICD-10-CM | POA: Diagnosis not present

## 2019-12-14 DIAGNOSIS — F330 Major depressive disorder, recurrent, mild: Secondary | ICD-10-CM | POA: Diagnosis not present

## 2019-12-14 DIAGNOSIS — I868 Varicose veins of other specified sites: Secondary | ICD-10-CM | POA: Diagnosis not present

## 2019-12-25 DIAGNOSIS — I8311 Varicose veins of right lower extremity with inflammation: Secondary | ICD-10-CM | POA: Diagnosis not present

## 2019-12-25 DIAGNOSIS — I83811 Varicose veins of right lower extremities with pain: Secondary | ICD-10-CM | POA: Diagnosis not present

## 2019-12-30 DIAGNOSIS — Z1231 Encounter for screening mammogram for malignant neoplasm of breast: Secondary | ICD-10-CM | POA: Diagnosis not present

## 2020-01-05 DIAGNOSIS — I83819 Varicose veins of unspecified lower extremities with pain: Secondary | ICD-10-CM | POA: Diagnosis not present

## 2020-02-12 DIAGNOSIS — I8311 Varicose veins of right lower extremity with inflammation: Secondary | ICD-10-CM | POA: Diagnosis not present

## 2020-02-12 DIAGNOSIS — I83811 Varicose veins of right lower extremities with pain: Secondary | ICD-10-CM | POA: Diagnosis not present

## 2020-02-22 DIAGNOSIS — I8311 Varicose veins of right lower extremity with inflammation: Secondary | ICD-10-CM | POA: Diagnosis not present

## 2020-02-22 DIAGNOSIS — I83811 Varicose veins of right lower extremities with pain: Secondary | ICD-10-CM | POA: Diagnosis not present

## 2020-03-11 DIAGNOSIS — M7981 Nontraumatic hematoma of soft tissue: Secondary | ICD-10-CM | POA: Diagnosis not present

## 2020-03-11 DIAGNOSIS — I8311 Varicose veins of right lower extremity with inflammation: Secondary | ICD-10-CM | POA: Diagnosis not present

## 2020-03-25 DIAGNOSIS — I8311 Varicose veins of right lower extremity with inflammation: Secondary | ICD-10-CM | POA: Diagnosis not present

## 2020-03-25 DIAGNOSIS — I83811 Varicose veins of right lower extremities with pain: Secondary | ICD-10-CM | POA: Diagnosis not present

## 2020-05-16 ENCOUNTER — Encounter (INDEPENDENT_AMBULATORY_CARE_PROVIDER_SITE_OTHER): Payer: Medicare Other | Admitting: Ophthalmology

## 2020-05-18 DIAGNOSIS — H43812 Vitreous degeneration, left eye: Secondary | ICD-10-CM | POA: Insufficient documentation

## 2020-05-18 DIAGNOSIS — H43811 Vitreous degeneration, right eye: Secondary | ICD-10-CM | POA: Insufficient documentation

## 2020-05-18 DIAGNOSIS — Z961 Presence of intraocular lens: Secondary | ICD-10-CM | POA: Insufficient documentation

## 2020-05-25 ENCOUNTER — Encounter (INDEPENDENT_AMBULATORY_CARE_PROVIDER_SITE_OTHER): Payer: Self-pay | Admitting: Ophthalmology

## 2020-05-25 ENCOUNTER — Other Ambulatory Visit: Payer: Self-pay

## 2020-05-25 ENCOUNTER — Ambulatory Visit (INDEPENDENT_AMBULATORY_CARE_PROVIDER_SITE_OTHER): Payer: Medicare Other | Admitting: Ophthalmology

## 2020-05-25 DIAGNOSIS — Z961 Presence of intraocular lens: Secondary | ICD-10-CM

## 2020-05-25 DIAGNOSIS — H43812 Vitreous degeneration, left eye: Secondary | ICD-10-CM

## 2020-05-25 DIAGNOSIS — E119 Type 2 diabetes mellitus without complications: Secondary | ICD-10-CM

## 2020-05-25 DIAGNOSIS — Z23 Encounter for immunization: Secondary | ICD-10-CM | POA: Diagnosis not present

## 2020-05-25 DIAGNOSIS — H43811 Vitreous degeneration, right eye: Secondary | ICD-10-CM

## 2020-05-25 NOTE — Assessment & Plan Note (Signed)

## 2020-05-25 NOTE — Assessment & Plan Note (Signed)

## 2020-05-25 NOTE — Progress Notes (Signed)
05/25/2020     CHIEF COMPLAINT Patient presents for Retina Follow Up   HISTORY OF PRESENT ILLNESS: Alice Anthony is a 79 y.o. female who presents to the clinic today for:   HPI    Retina Follow Up    Patient presents with  PVD.  In both eyes.  Severity is moderate.  Duration of 2 years.  Since onset it is stable.  I, the attending physician,  performed the HPI with the patient and updated documentation appropriately.          Comments    2 Year PVD f\u OU. OCT  Pt states she has not noticed any changes in vision. Pt denies any complaints. A1C: 6       Last edited by Tilda Franco on 05/25/2020  1:13 PM. (History)      Referring physician: Jonathon Jordan, MD [place] [address],  [place] [address]  HISTORICAL INFORMATION:   Selected notes from the MEDICAL RECORD NUMBER    Lab Results  Component Value Date   HGBA1C 6.5 (H) 09/22/2012     CURRENT MEDICATIONS: No current outpatient medications on file. (Ophthalmic Drugs)   No current facility-administered medications for this visit. (Ophthalmic Drugs)   Current Outpatient Medications (Other)  Medication Sig  . ALPRAZolam (XANAX) 0.25 MG tablet Take 0.125-0.25 mg by mouth daily as needed for sleep or anxiety.  Marland Kitchen amLODipine (NORVASC) 5 MG tablet Take 5 mg by mouth daily.  . Ascorbic Acid (VITAMIN C) 1000 MG tablet Take 1,000 mg by mouth daily.  Marland Kitchen aspirin 81 MG tablet Take 81 mg by mouth daily.  . calcium carbonate (TUMS - DOSED IN MG ELEMENTAL CALCIUM) 500 MG chewable tablet Chew 1 tablet by mouth 2 (two) times daily.  . Calcium Carbonate-Vitamin D (CALCIUM PLUS VITAMIN D PO) Take 1 tablet by mouth 2 (two) times daily.  . cetirizine (ZYRTEC) 10 MG tablet Take 10 mg by mouth at bedtime. REQUIRED NIGHTLY.  Marland Kitchen Cholecalciferol (VITAMIN D3) 2000 UNITS capsule Take 2,000 Units by mouth daily.  Marland Kitchen FIBER ADULT GUMMIES PO Take 3 tablets by mouth daily.  . fluticasone (FLONASE) 50 MCG/ACT nasal spray  Place 2 sprays into the nose at bedtime. REQUIRED NIGHTLY  . HYDROcodone-acetaminophen (NORCO/VICODIN) 5-325 MG tablet Take 1 tablet by mouth every 4 (four) hours as needed.  . hydrOXYzine (ATARAX/VISTARIL) 10 MG tablet Take 10 mg by mouth at bedtime. REQUIRED NIGHTLY.  Marland Kitchen KRILL OIL PO Take 1 tablet by mouth daily.  . Multiple Vitamin (MULTIVITAMIN WITH MINERALS) TABS Take 1 tablet by mouth 2 (two) times daily.  . ondansetron (ZOFRAN ODT) 4 MG disintegrating tablet Take 1 tablet (4 mg total) by mouth every 8 (eight) hours as needed for nausea or vomiting.  . ondansetron (ZOFRAN) 4 MG tablet Take 4 mg by mouth every 8 (eight) hours as needed for nausea or vomiting.  . Probiotic Product (ALIGN) 4 MG CAPS Take by mouth.  . simvastatin (ZOCOR) 20 MG tablet Take 20 mg by mouth every evening.   No current facility-administered medications for this visit. (Other)      REVIEW OF SYSTEMS:    ALLERGIES Allergies  Allergen Reactions  . Vioxx [Rofecoxib] Shortness Of Breath  . Codeine Nausea And Vomiting  . Fosamax [Alendronate Sodium] Nausea And Vomiting  . Latex Rash    PAST MEDICAL HISTORY Past Medical History:  Diagnosis Date  . Anxiety   . Asthma    Hx: of  . Cancer (  [place])    Hx: of skin cancer  . Diverticulosis   . GERD (gastroesophageal reflux disease)    Hx: of  . History of kidney stones   . Hyperlipemia   . Hypertension    Past Surgical History:  Procedure Laterality Date  . ABDOMINAL SURGERY  09/19/2012   Ruptured Diverticuli, Ostomy Placed.  Marland Kitchen BREAST BIOPSY  06/2012   breast biopsy  . COLONOSCOPY  03/2012  . COLOSTOMY CLOSURE  12/25/2012   Dr Brantley Stage  . COLOSTOMY CLOSURE N/A 12/25/2012   Procedure: COLOSTOMY CLOSURE;  Surgeon: Joyice Faster. Cornett, MD;  Location: [facility];  Service: General;  Laterality: N/A;  . EXTRACORPOREAL SHOCK WAVE LITHOTRIPSY Left 11/17/2018   Procedure: EXTRACORPOREAL SHOCK WAVE LITHOTRIPSY (ESWL);  Surgeon: Cleon Gustin, MD;  [facility];  Service: Urology;  Laterality: Left;  . EYE SURGERY  10/19/1998   Hx: of cyst removed from left eye  . LAPAROTOMY N/A 09/19/2012   Procedure: EXPLORATORY LAPAROTOMY, sigmoid colectomy;  Surgeon: Joyice Faster. Cornett, MD;  Location: [facility];  Service: General;  Laterality: N/A;  . SIGMOIDOSCOPY  01/11/1999  . TONSILLECTOMY    . TRIGGER FINGER RELEASE  12/25/2010  . TUBAL LIGATION  10-16-1985    FAMILY HISTORY Family History  Problem Relation Age of Onset  . Diabetes Father   . Alzheimer's disease Father   . Basal cell carcinoma Mother        Metastatic    SOCIAL HISTORY Social History   Tobacco Use  . Smoking status: Never Smoker  . Smokeless tobacco: Never Used  Substance Use Topics  . Alcohol use: No  . Drug use: No         OPHTHALMIC EXAM: Base Eye Exam    Visual Acuity (Snellen - Linear)      Right Left   Dist [place] 20/25 -2 20/25       Tonometry (Tonopen, 1:19 PM)      Right Left   Pressure 17 18       Pupils      Pupils Dark Light Shape React APD   Right PERRL 4 3 Round Brisk None   Left PERRL 4 3 Round Brisk None       Visual Fields (Counting fingers)      Left Right    Full Full       Neuro/Psych    Oriented x3: Yes   Mood/Affect: Normal       Dilation    Both eyes: 1.0% Mydriacyl, 2.5% Phenylephrine @ 1:19 PM        Slit Lamp and Fundus Exam    External Exam      Right Left   External Normal Normal       Slit Lamp Exam      Right Left   Lids/Lashes Normal Normal   Conjunctiva/Sclera White and quiet White and quiet   Cornea Clear Clear   Anterior Chamber Deep and quiet Deep and quiet   Iris Round and reactive Round and reactive   Lens Centered posterior chamber intraocular lens Centered posterior chamber intraocular lens   Anterior Vitreous Normal Normal       Fundus Exam      Right Left   Posterior Vitreous Posterior vitreous detachment Posterior vitreous detachment   Disc Normal Normal   C/D Ratio 0.3 0.3   Macula Normal  Normal   Vessels no DR no DR   Periphery Normal Normal  IMAGING AND PROCEDURES  Imaging and Procedures for 05/25/20  OCT, Retina - OU - Both Eyes       Right Eye Quality was good. Scan locations included subfoveal. Central Foveal Thickness: 261. Findings include normal foveal contour.   Left Eye Quality was good. Scan locations included subfoveal. Central Foveal Thickness: 272. Findings include normal foveal contour.   Notes PVD OU                ASSESSMENT/PLAN:  Diabetes mellitus without complication ([place]) The patient has diabetes without any evidence of retinopathy. The patient advised to maintain good blood glucose control, excellent blood pressure control, and favorable levels of cholesterol, low density lipoprotein, and high density lipoproteins. Follow up in 1 year was recommended. Explained that fluctuations in visual acuity , or "out of focus", may result from large variations of blood sugar control.  Posterior vitreous detachment of left eye   The nature of posterior vitreous detachment was discussed with the patient as well as its physiology, its age prevalence, and its possible implication regarding retinal breaks and detachment.  An informational brochure was given to the patient.  All the patient's questions were answered.  The patient was asked to return if new or different flashes or floaters develops.   Patient was instructed to contact office immediately if any changes were noticed. I explained to the patient that vitreous inside the eye is similar to jello inside a bowl. As the jello melts it can start to pull away from the bowl, similarly the vitreous throughout our lives can begin to pull away from the retina. That process is called a posterior vitreous detachment. In some cases, the vitreous can tug hard enough on the retina to form a retinal tear. I discussed with the patient the signs and symptoms of a retinal detachment.  Do not rub the eye.        ICD-10-CM   1. Posterior vitreous detachment of right eye  H43.811 OCT, Retina - OU - Both Eyes  2. Posterior vitreous detachment of left eye  H43.812 OCT, Retina - OU - Both Eyes  3. Pseudophakia, both eyes  Z96.1   4. Diabetes mellitus without complication ([place])  G31.5     1.  Continue blood sugar control monitoring stressed.  2.  No diabetic retinopathy at this time.  3.  Ophthalmic Meds Ordered this visit:  No orders of the defined types were placed in this encounter.      Return in about 2 years (around 05/25/2022) for DILATE OU, COLOR FP.  There are no Patient Instructions on file for this visit.   Explained the diagnoses, plan, and follow up with the patient and they expressed understanding.  Patient expressed understanding of the importance of proper follow up care.   Clent Demark Delva Derden M.D. Diseases & Surgery of the Retina and Vitreous [facility] 05/25/20     Abbreviations: M myopia (nearsighted); A astigmatism; H hyperopia (farsighted); P presbyopia; Mrx spectacle prescription;  CTL contact lenses; OD right eye; OS left eye; OU both eyes  XT exotropia; ET esotropia; PEK punctate epithelial keratitis; PEE punctate epithelial erosions; DES dry eye syndrome; MGD meibomian gland dysfunction; ATs artificial tears; PFAT's preservative free artificial tears; [place] nuclear sclerotic cataract; PSC posterior subcapsular cataract; ERM epi-retinal membrane; PVD posterior vitreous detachment; RD retinal detachment; DM diabetes mellitus; DR diabetic retinopathy; NPDR non-proliferative diabetic retinopathy; PDR proliferative diabetic retinopathy; CSME clinically significant macular edema; DME diabetic macular edema; dbh dot blot  hemorrhages; CWS cotton wool spot; POAG primary open angle glaucoma; C/D cup-to-disc ratio; HVF humphrey visual field; GVF goldmann visual field; OCT optical coherence tomography; IOP intraocular pressure; BRVO Branch retinal vein occlusion;  CRVO central retinal vein occlusion; CRAO central retinal artery occlusion; BRAO branch retinal artery occlusion; RT retinal tear; SB scleral buckle; PPV pars plana vitrectomy; VH Vitreous hemorrhage; PRP panretinal laser photocoagulation; IVK intravitreal kenalog; VMT vitreomacular traction; MH Macular hole;  NVD neovascularization of the disc; NVE neovascularization elsewhere; AREDS age related eye disease study; ARMD age related macular degeneration; POAG primary open angle glaucoma; EBMD epithelial/anterior basement membrane dystrophy; ACIOL anterior chamber intraocular lens; IOL intraocular lens; PCIOL posterior chamber intraocular lens; Phaco/IOL phacoemulsification with intraocular lens placement; [place] photorefractive keratectomy; LASIK laser assisted in situ keratomileusis; HTN hypertension; DM diabetes mellitus; COPD chronic obstructive pulmonary disease

## 2020-06-07 DIAGNOSIS — M25561 Pain in right knee: Secondary | ICD-10-CM | POA: Diagnosis not present

## 2020-06-09 DIAGNOSIS — M1711 Unilateral primary osteoarthritis, right knee: Secondary | ICD-10-CM | POA: Diagnosis not present

## 2020-09-02 DIAGNOSIS — M1711 Unilateral primary osteoarthritis, right knee: Secondary | ICD-10-CM | POA: Diagnosis not present

## 2020-09-06 DIAGNOSIS — M25561 Pain in right knee: Secondary | ICD-10-CM | POA: Diagnosis not present

## 2020-09-08 DIAGNOSIS — M25561 Pain in right knee: Secondary | ICD-10-CM | POA: Diagnosis not present

## 2020-09-12 DIAGNOSIS — Z79899 Other long term (current) drug therapy: Secondary | ICD-10-CM | POA: Diagnosis not present

## 2020-09-12 DIAGNOSIS — E1122 Type 2 diabetes mellitus with diabetic chronic kidney disease: Secondary | ICD-10-CM | POA: Diagnosis not present

## 2020-09-12 DIAGNOSIS — M353 Polymyalgia rheumatica: Secondary | ICD-10-CM | POA: Diagnosis not present

## 2020-09-12 DIAGNOSIS — I10 Essential (primary) hypertension: Secondary | ICD-10-CM | POA: Diagnosis not present

## 2020-09-12 DIAGNOSIS — Z7984 Long term (current) use of oral hypoglycemic drugs: Secondary | ICD-10-CM | POA: Diagnosis not present

## 2020-09-12 DIAGNOSIS — N182 Chronic kidney disease, stage 2 (mild): Secondary | ICD-10-CM | POA: Diagnosis not present

## 2020-09-12 DIAGNOSIS — E7800 Pure hypercholesterolemia, unspecified: Secondary | ICD-10-CM | POA: Diagnosis not present

## 2020-09-12 DIAGNOSIS — F330 Major depressive disorder, recurrent, mild: Secondary | ICD-10-CM | POA: Diagnosis not present

## 2020-09-12 DIAGNOSIS — Z0000 Encounter for general adult medical examination without abnormal findings: Secondary | ICD-10-CM | POA: Diagnosis not present

## 2020-09-12 DIAGNOSIS — E2839 Other primary ovarian failure: Secondary | ICD-10-CM | POA: Diagnosis not present

## 2020-09-12 DIAGNOSIS — E559 Vitamin D deficiency, unspecified: Secondary | ICD-10-CM | POA: Diagnosis not present

## 2020-09-12 DIAGNOSIS — J309 Allergic rhinitis, unspecified: Secondary | ICD-10-CM | POA: Diagnosis not present

## 2020-11-22 DIAGNOSIS — M25561 Pain in right knee: Secondary | ICD-10-CM | POA: Diagnosis not present

## 2020-11-22 DIAGNOSIS — M545 Low back pain, unspecified: Secondary | ICD-10-CM | POA: Diagnosis not present

## 2020-12-07 DIAGNOSIS — L814 Other melanin hyperpigmentation: Secondary | ICD-10-CM | POA: Diagnosis not present

## 2020-12-07 DIAGNOSIS — D2272 Melanocytic nevi of left lower limb, including hip: Secondary | ICD-10-CM | POA: Diagnosis not present

## 2020-12-07 DIAGNOSIS — D1801 Hemangioma of skin and subcutaneous tissue: Secondary | ICD-10-CM | POA: Diagnosis not present

## 2020-12-07 DIAGNOSIS — L821 Other seborrheic keratosis: Secondary | ICD-10-CM | POA: Diagnosis not present

## 2020-12-07 DIAGNOSIS — D2271 Melanocytic nevi of right lower limb, including hip: Secondary | ICD-10-CM | POA: Diagnosis not present

## 2020-12-07 DIAGNOSIS — Z85828 Personal history of other malignant neoplasm of skin: Secondary | ICD-10-CM | POA: Diagnosis not present

## 2020-12-30 DIAGNOSIS — Z1231 Encounter for screening mammogram for malignant neoplasm of breast: Secondary | ICD-10-CM | POA: Diagnosis not present

## 2021-01-18 DIAGNOSIS — M8589 Other specified disorders of bone density and structure, multiple sites: Secondary | ICD-10-CM | POA: Diagnosis not present

## 2021-01-18 DIAGNOSIS — M810 Age-related osteoporosis without current pathological fracture: Secondary | ICD-10-CM | POA: Diagnosis not present

## 2021-01-18 DIAGNOSIS — R928 Other abnormal and inconclusive findings on diagnostic imaging of breast: Secondary | ICD-10-CM | POA: Diagnosis not present

## 2021-01-18 DIAGNOSIS — Z780 Asymptomatic menopausal state: Secondary | ICD-10-CM | POA: Diagnosis not present

## 2021-01-18 DIAGNOSIS — R922 Inconclusive mammogram: Secondary | ICD-10-CM | POA: Diagnosis not present

## 2021-01-25 DIAGNOSIS — F4321 Adjustment disorder with depressed mood: Secondary | ICD-10-CM | POA: Diagnosis not present

## 2021-01-25 DIAGNOSIS — L259 Unspecified contact dermatitis, unspecified cause: Secondary | ICD-10-CM | POA: Diagnosis not present

## 2021-03-27 DIAGNOSIS — D3131 Benign neoplasm of right choroid: Secondary | ICD-10-CM | POA: Diagnosis not present

## 2021-03-28 DIAGNOSIS — M1711 Unilateral primary osteoarthritis, right knee: Secondary | ICD-10-CM | POA: Diagnosis not present

## 2021-04-04 DIAGNOSIS — E119 Type 2 diabetes mellitus without complications: Secondary | ICD-10-CM | POA: Diagnosis not present

## 2021-04-04 DIAGNOSIS — Z23 Encounter for immunization: Secondary | ICD-10-CM | POA: Diagnosis not present

## 2021-04-04 DIAGNOSIS — F330 Major depressive disorder, recurrent, mild: Secondary | ICD-10-CM | POA: Diagnosis not present

## 2021-05-02 DIAGNOSIS — Z23 Encounter for immunization: Secondary | ICD-10-CM | POA: Diagnosis not present

## 2021-05-05 DIAGNOSIS — H53483 Generalized contraction of visual field, bilateral: Secondary | ICD-10-CM | POA: Diagnosis not present

## 2021-05-05 DIAGNOSIS — H02423 Myogenic ptosis of bilateral eyelids: Secondary | ICD-10-CM | POA: Diagnosis not present

## 2021-05-05 DIAGNOSIS — H57813 Brow ptosis, bilateral: Secondary | ICD-10-CM | POA: Diagnosis not present

## 2021-05-05 DIAGNOSIS — H02831 Dermatochalasis of right upper eyelid: Secondary | ICD-10-CM | POA: Diagnosis not present

## 2021-05-05 DIAGNOSIS — H02413 Mechanical ptosis of bilateral eyelids: Secondary | ICD-10-CM | POA: Diagnosis not present

## 2021-05-05 DIAGNOSIS — H0279 Other degenerative disorders of eyelid and periocular area: Secondary | ICD-10-CM | POA: Diagnosis not present

## 2021-05-05 DIAGNOSIS — H02834 Dermatochalasis of left upper eyelid: Secondary | ICD-10-CM | POA: Diagnosis not present

## 2021-05-09 DIAGNOSIS — M1711 Unilateral primary osteoarthritis, right knee: Secondary | ICD-10-CM | POA: Diagnosis not present

## 2021-05-22 DIAGNOSIS — H53483 Generalized contraction of visual field, bilateral: Secondary | ICD-10-CM | POA: Diagnosis not present

## 2021-08-15 DIAGNOSIS — H53483 Generalized contraction of visual field, bilateral: Secondary | ICD-10-CM | POA: Diagnosis not present

## 2021-08-15 DIAGNOSIS — H57813 Brow ptosis, bilateral: Secondary | ICD-10-CM | POA: Diagnosis not present

## 2021-08-15 DIAGNOSIS — H02834 Dermatochalasis of left upper eyelid: Secondary | ICD-10-CM | POA: Diagnosis not present

## 2021-08-15 DIAGNOSIS — H02423 Myogenic ptosis of bilateral eyelids: Secondary | ICD-10-CM | POA: Diagnosis not present

## 2021-08-15 DIAGNOSIS — H02413 Mechanical ptosis of bilateral eyelids: Secondary | ICD-10-CM | POA: Diagnosis not present

## 2021-08-15 DIAGNOSIS — H02831 Dermatochalasis of right upper eyelid: Secondary | ICD-10-CM | POA: Diagnosis not present

## 2021-10-04 DIAGNOSIS — Z9049 Acquired absence of other specified parts of digestive tract: Secondary | ICD-10-CM | POA: Diagnosis not present

## 2021-10-04 DIAGNOSIS — Z0000 Encounter for general adult medical examination without abnormal findings: Secondary | ICD-10-CM | POA: Diagnosis not present

## 2021-10-04 DIAGNOSIS — I10 Essential (primary) hypertension: Secondary | ICD-10-CM | POA: Diagnosis not present

## 2021-10-04 DIAGNOSIS — N182 Chronic kidney disease, stage 2 (mild): Secondary | ICD-10-CM | POA: Diagnosis not present

## 2021-10-04 DIAGNOSIS — J309 Allergic rhinitis, unspecified: Secondary | ICD-10-CM | POA: Diagnosis not present

## 2021-10-04 DIAGNOSIS — E559 Vitamin D deficiency, unspecified: Secondary | ICD-10-CM | POA: Diagnosis not present

## 2021-10-04 DIAGNOSIS — Z79899 Other long term (current) drug therapy: Secondary | ICD-10-CM | POA: Diagnosis not present

## 2021-10-04 DIAGNOSIS — E1169 Type 2 diabetes mellitus with other specified complication: Secondary | ICD-10-CM | POA: Diagnosis not present

## 2021-10-04 DIAGNOSIS — F330 Major depressive disorder, recurrent, mild: Secondary | ICD-10-CM | POA: Diagnosis not present

## 2021-10-04 DIAGNOSIS — E7800 Pure hypercholesterolemia, unspecified: Secondary | ICD-10-CM | POA: Diagnosis not present

## 2021-10-04 DIAGNOSIS — I129 Hypertensive chronic kidney disease with stage 1 through stage 4 chronic kidney disease, or unspecified chronic kidney disease: Secondary | ICD-10-CM | POA: Diagnosis not present

## 2021-10-04 DIAGNOSIS — E1122 Type 2 diabetes mellitus with diabetic chronic kidney disease: Secondary | ICD-10-CM | POA: Diagnosis not present

## 2021-12-11 DIAGNOSIS — L821 Other seborrheic keratosis: Secondary | ICD-10-CM | POA: Diagnosis not present

## 2021-12-11 DIAGNOSIS — D2271 Melanocytic nevi of right lower limb, including hip: Secondary | ICD-10-CM | POA: Diagnosis not present

## 2021-12-11 DIAGNOSIS — D1801 Hemangioma of skin and subcutaneous tissue: Secondary | ICD-10-CM | POA: Diagnosis not present

## 2021-12-11 DIAGNOSIS — Z85828 Personal history of other malignant neoplasm of skin: Secondary | ICD-10-CM | POA: Diagnosis not present

## 2021-12-11 DIAGNOSIS — D485 Neoplasm of uncertain behavior of skin: Secondary | ICD-10-CM | POA: Diagnosis not present

## 2021-12-11 DIAGNOSIS — L814 Other melanin hyperpigmentation: Secondary | ICD-10-CM | POA: Diagnosis not present

## 2021-12-11 DIAGNOSIS — L820 Inflamed seborrheic keratosis: Secondary | ICD-10-CM | POA: Diagnosis not present

## 2022-01-11 DIAGNOSIS — R062 Wheezing: Secondary | ICD-10-CM | POA: Diagnosis not present

## 2022-01-31 DIAGNOSIS — R350 Frequency of micturition: Secondary | ICD-10-CM | POA: Diagnosis not present

## 2022-01-31 DIAGNOSIS — N760 Acute vaginitis: Secondary | ICD-10-CM | POA: Diagnosis not present

## 2022-02-07 DIAGNOSIS — N390 Urinary tract infection, site not specified: Secondary | ICD-10-CM | POA: Diagnosis not present

## 2022-05-01 DIAGNOSIS — E1122 Type 2 diabetes mellitus with diabetic chronic kidney disease: Secondary | ICD-10-CM | POA: Diagnosis not present

## 2022-05-01 DIAGNOSIS — F330 Major depressive disorder, recurrent, mild: Secondary | ICD-10-CM | POA: Diagnosis not present

## 2022-05-16 DIAGNOSIS — D3131 Benign neoplasm of right choroid: Secondary | ICD-10-CM | POA: Diagnosis not present

## 2022-05-28 ENCOUNTER — Encounter (INDEPENDENT_AMBULATORY_CARE_PROVIDER_SITE_OTHER): Payer: Medicare Other | Admitting: Ophthalmology

## 2022-05-28 DIAGNOSIS — E119 Type 2 diabetes mellitus without complications: Secondary | ICD-10-CM | POA: Diagnosis not present

## 2022-05-28 DIAGNOSIS — D3131 Benign neoplasm of right choroid: Secondary | ICD-10-CM | POA: Diagnosis not present

## 2022-05-28 DIAGNOSIS — H43813 Vitreous degeneration, bilateral: Secondary | ICD-10-CM | POA: Diagnosis not present

## 2022-06-02 DIAGNOSIS — Z23 Encounter for immunization: Secondary | ICD-10-CM | POA: Diagnosis not present

## 2022-08-01 DIAGNOSIS — M545 Low back pain, unspecified: Secondary | ICD-10-CM | POA: Diagnosis not present

## 2022-08-01 DIAGNOSIS — Z20822 Contact with and (suspected) exposure to covid-19: Secondary | ICD-10-CM | POA: Diagnosis not present

## 2022-08-20 DIAGNOSIS — M545 Low back pain, unspecified: Secondary | ICD-10-CM | POA: Diagnosis not present

## 2022-08-23 DIAGNOSIS — M47816 Spondylosis without myelopathy or radiculopathy, lumbar region: Secondary | ICD-10-CM | POA: Diagnosis not present

## 2022-08-23 DIAGNOSIS — M4316 Spondylolisthesis, lumbar region: Secondary | ICD-10-CM | POA: Diagnosis not present

## 2022-08-28 DIAGNOSIS — M47816 Spondylosis without myelopathy or radiculopathy, lumbar region: Secondary | ICD-10-CM | POA: Diagnosis not present

## 2022-08-28 DIAGNOSIS — M4316 Spondylolisthesis, lumbar region: Secondary | ICD-10-CM | POA: Diagnosis not present

## 2022-08-30 ENCOUNTER — Other Ambulatory Visit (HOSPITAL_COMMUNITY): Payer: Self-pay | Admitting: Physician Assistant

## 2022-08-30 DIAGNOSIS — Z1211 Encounter for screening for malignant neoplasm of colon: Secondary | ICD-10-CM | POA: Diagnosis not present

## 2022-08-30 DIAGNOSIS — K8020 Calculus of gallbladder without cholecystitis without obstruction: Secondary | ICD-10-CM | POA: Diagnosis not present

## 2022-08-30 DIAGNOSIS — R109 Unspecified abdominal pain: Secondary | ICD-10-CM

## 2022-08-30 DIAGNOSIS — R1084 Generalized abdominal pain: Secondary | ICD-10-CM | POA: Diagnosis not present

## 2022-08-30 DIAGNOSIS — R1900 Intra-abdominal and pelvic swelling, mass and lump, unspecified site: Secondary | ICD-10-CM | POA: Diagnosis not present

## 2022-08-30 DIAGNOSIS — M545 Low back pain, unspecified: Secondary | ICD-10-CM | POA: Diagnosis not present

## 2022-08-31 DIAGNOSIS — M4316 Spondylolisthesis, lumbar region: Secondary | ICD-10-CM | POA: Diagnosis not present

## 2022-08-31 DIAGNOSIS — M47816 Spondylosis without myelopathy or radiculopathy, lumbar region: Secondary | ICD-10-CM | POA: Diagnosis not present

## 2022-09-03 DIAGNOSIS — M47816 Spondylosis without myelopathy or radiculopathy, lumbar region: Secondary | ICD-10-CM | POA: Diagnosis not present

## 2022-09-03 DIAGNOSIS — M4316 Spondylolisthesis, lumbar region: Secondary | ICD-10-CM | POA: Diagnosis not present

## 2022-09-06 DIAGNOSIS — M4316 Spondylolisthesis, lumbar region: Secondary | ICD-10-CM | POA: Diagnosis not present

## 2022-09-06 DIAGNOSIS — M47816 Spondylosis without myelopathy or radiculopathy, lumbar region: Secondary | ICD-10-CM | POA: Diagnosis not present

## 2022-09-10 ENCOUNTER — Ambulatory Visit (HOSPITAL_COMMUNITY)
Admission: RE | Admit: 2022-09-10 | Discharge: 2022-09-10 | Disposition: A | Discharge status: Home / Self Care | Payer: Medicare Other | Source: Ambulatory Visit | Attending: Physician Assistant | Admitting: Physician Assistant

## 2022-09-10 DIAGNOSIS — R109 Unspecified abdominal pain: Secondary | ICD-10-CM | POA: Diagnosis not present

## 2022-09-10 DIAGNOSIS — K8020 Calculus of gallbladder without cholecystitis without obstruction: Secondary | ICD-10-CM | POA: Diagnosis not present

## 2022-09-10 DIAGNOSIS — N281 Cyst of kidney, acquired: Secondary | ICD-10-CM | POA: Diagnosis not present

## 2022-09-10 MED ORDER — IOHEXOL 300 MG/ML  SOLN
80.0000 mL | Freq: Once | INTRAMUSCULAR | Status: AC | PRN
  Administered 2022-09-10: 80 mL via INTRAVENOUS

## 2022-09-10 MED ORDER — SODIUM CHLORIDE (PF) 0.9 % IJ SOLN
INTRAMUSCULAR | Status: AC
  Filled 2022-09-10: qty 50

## 2022-09-13 DIAGNOSIS — M4316 Spondylolisthesis, lumbar region: Secondary | ICD-10-CM | POA: Diagnosis not present

## 2022-09-13 DIAGNOSIS — M47816 Spondylosis without myelopathy or radiculopathy, lumbar region: Secondary | ICD-10-CM | POA: Diagnosis not present

## 2022-09-14 DIAGNOSIS — R9389 Abnormal findings on diagnostic imaging of other specified body structures: Secondary | ICD-10-CM | POA: Diagnosis not present

## 2022-09-14 DIAGNOSIS — S22070A Wedge compression fracture of T9-T10 vertebra, initial encounter for closed fracture: Secondary | ICD-10-CM | POA: Diagnosis not present

## 2022-09-14 DIAGNOSIS — K8020 Calculus of gallbladder without cholecystitis without obstruction: Secondary | ICD-10-CM | POA: Diagnosis not present

## 2022-09-16 ENCOUNTER — Encounter (HOSPITAL_COMMUNITY): Payer: Self-pay

## 2022-09-16 ENCOUNTER — Other Ambulatory Visit: Payer: Self-pay

## 2022-09-16 ENCOUNTER — Emergency Department (HOSPITAL_COMMUNITY)
Admission: EM | Admit: 2022-09-16 | Discharge: 2022-09-16 | Disposition: A | Discharge status: Home / Self Care | Payer: Medicare Other | Attending: Emergency Medicine | Admitting: Emergency Medicine

## 2022-09-16 DIAGNOSIS — S299XXA Unspecified injury of thorax, initial encounter: Secondary | ICD-10-CM | POA: Diagnosis present

## 2022-09-16 DIAGNOSIS — Z7982 Long term (current) use of aspirin: Secondary | ICD-10-CM | POA: Insufficient documentation

## 2022-09-16 DIAGNOSIS — S22070A Wedge compression fracture of T9-T10 vertebra, initial encounter for closed fracture: Secondary | ICD-10-CM | POA: Insufficient documentation

## 2022-09-16 DIAGNOSIS — X58XXXA Exposure to other specified factors, initial encounter: Secondary | ICD-10-CM | POA: Diagnosis not present

## 2022-09-16 DIAGNOSIS — Z91040 Latex allergy status: Secondary | ICD-10-CM | POA: Diagnosis not present

## 2022-09-16 DIAGNOSIS — M549 Dorsalgia, unspecified: Secondary | ICD-10-CM | POA: Diagnosis not present

## 2022-09-16 MED ORDER — ONDANSETRON HCL 4 MG/2ML IJ SOLN
4.0000 mg | Freq: Once | INTRAMUSCULAR | Status: AC
  Administered 2022-09-16: 4 mg via INTRAVENOUS
  Filled 2022-09-16: qty 2

## 2022-09-16 MED ORDER — DIAZEPAM 5 MG PO TABS: 5.0000 mg | ORAL_TABLET | Freq: Two times a day (BID) | ORAL | 0 refills | Status: DC | PRN

## 2022-09-16 MED ORDER — ONDANSETRON 4 MG PO TBDP
4.0000 mg | ORAL_TABLET | Freq: Once | ORAL | Status: AC
  Administered 2022-09-16: 4 mg via ORAL
  Filled 2022-09-16: qty 1

## 2022-09-16 MED ORDER — MORPHINE SULFATE (PF) 4 MG/ML IV SOLN
4.0000 mg | Freq: Once | INTRAVENOUS | Status: AC
  Administered 2022-09-16: 4 mg via INTRAVENOUS
  Filled 2022-09-16: qty 1

## 2022-09-16 MED ORDER — FENTANYL CITRATE PF 50 MCG/ML IJ SOSY
50.0000 ug | PREFILLED_SYRINGE | Freq: Once | INTRAMUSCULAR | Status: AC
  Administered 2022-09-16: 50 ug via INTRAVENOUS
  Filled 2022-09-16: qty 1

## 2022-09-16 MED ORDER — DIAZEPAM 5 MG/ML IJ SOLN
5.0000 mg | Freq: Once | INTRAMUSCULAR | Status: AC
  Administered 2022-09-16: 5 mg via INTRAVENOUS
  Filled 2022-09-16: qty 2

## 2022-09-16 NOTE — Progress Notes (Signed)
Orthopedic Tech Progress Note Patient Details:  Alice Anthony 07-15-1941 [id number]  Patient ID: Alice Anthony, female   DOB: Dec 25, 1940, 82 y.o.   MRN: [id number] Called order into [facility] pt was not able to handle the pain during application asked to come back at a later time pt nurse was notified. Edwina Barth 09/16/2022, 6:17 PM

## 2022-09-16 NOTE — ED Provider Notes (Signed)
[place] EMERGENCY DEPARTMENT AT [facility] Provider Note   CSN: [id number] Arrival date & time: 09/16/22  1604     History  Chief Complaint  Patient presents with   Back Pain    Alice Anthony is a 82 y.o. female.  Patient is a 82 year old female who presents with back pain.  She states that started hurting around Thanksgiving.  She said initially it felt like it was in her lower back area.  It has been progressively getting worse over the last month or so.  She was seen in December at an urgent care and said she had x-rays of her back done and was told that there may be a compression fracture of L4 and L5.  She then saw an orthopedist, Dr. Lynann Bologna in January and was sent to physical therapy.  No further imaging was done at that point.  She said she was unable to tolerate physical therapy and did not go back.  Over the last week she has had some worsening pain where it goes up toward her upper back although it seems to be more muscle spasms in that area per her report.  She was given a muscle relaxer at 1 point but did not find that very helpful.  She says it hurts to move in any way, mostly to roll over.  She was able to tolerate it and has been using Tylenol but today she was having bad spasms going up from her lower back up into her upper back and that was the main reason she came in today.  She denies any radiation down her legs.  No numbness or weakness to her extremities.  No fevers.  She is not on anticoagulants.  She has not had any recent falls or trauma.  She did feel like she was having some abdominal bloating and her PCP ordered a CT scan to evaluate her abdomen and her back on February 11.  This showed a compression fracture of T10 with 50% loss of height.       Home Medications Prior to Admission medications   Medication Sig Start Date End Date Taking? Authorizing Provider  diazepam (VALIUM) 5 MG tablet Take 1 tablet (5 mg total) by mouth every 12 (twelve)  hours as needed for muscle spasms. 09/16/22  Yes Malvin Johns, MD  ALPRAZolam Duanne Moron) 0.25 MG tablet Take 0.125-0.25 mg by mouth daily as needed for sleep or anxiety.    [provider]  amLODipine (NORVASC) 5 MG tablet Take 5 mg by mouth daily.    [provider]  Ascorbic Acid (VITAMIN C) 1000 MG tablet Take 1,000 mg by mouth daily.    [provider]  aspirin 81 MG tablet Take 81 mg by mouth daily.    [provider]  calcium carbonate (TUMS - DOSED IN MG ELEMENTAL CALCIUM) 500 MG chewable tablet Chew 1 tablet by mouth 2 (two) times daily.    [provider]  Calcium Carbonate-Vitamin D (CALCIUM PLUS VITAMIN D PO) Take 1 tablet by mouth 2 (two) times daily.    [provider]  cetirizine (ZYRTEC) 10 MG tablet Take 10 mg by mouth at bedtime. REQUIRED NIGHTLY.    [provider]  Cholecalciferol (VITAMIN D3) 2000 UNITS capsule Take 2,000 Units by mouth daily.    [provider]  FIBER ADULT GUMMIES PO Take 3 tablets by mouth daily.    [provider]  fluticasone (FLONASE) 50 MCG/ACT nasal spray Place 2 sprays into  the nose at bedtime. REQUIRED NIGHTLY    [provider]  HYDROcodone-acetaminophen (NORCO/VICODIN) 5-325 MG tablet Take 1 tablet by mouth every 4 (four) hours as needed. 10/20/18   Tacy Learn, PA-C  hydrOXYzine (ATARAX/VISTARIL) 10 MG tablet Take 10 mg by mouth at bedtime. REQUIRED NIGHTLY.    [provider]  KRILL OIL PO Take 1 tablet by mouth daily.    [provider]  Multiple Vitamin (MULTIVITAMIN WITH MINERALS) TABS Take 1 tablet by mouth 2 (two) times daily.    [provider]  ondansetron (ZOFRAN ODT) 4 MG disintegrating tablet Take 1 tablet (4 mg total) by mouth every 8 (eight) hours as needed for nausea or vomiting. 10/20/18   Tacy Learn, PA-C  ondansetron (ZOFRAN) 4 MG tablet Take 4 mg by mouth every 8 (eight) hours as needed for nausea or vomiting.     [provider]  Probiotic Product (ALIGN) 4 MG CAPS Take by mouth.    [provider]  simvastatin (ZOCOR) 20 MG tablet Take 20 mg by mouth every evening.    [provider]      Allergies    Vioxx [rofecoxib], Codeine, Doxycycline hyclate, Fosamax [alendronate sodium], and Latex    Review of Systems   Review of Systems  Constitutional:  Negative for fever.  Gastrointestinal:  Negative for nausea and vomiting.  Musculoskeletal:  Positive for back pain and myalgias. Negative for arthralgias, joint swelling and neck pain.  Skin:  Negative for wound.  Neurological:  Negative for weakness, numbness and headaches.    Physical Exam Updated Vital Signs BP (!) 117/59   Pulse 65   Temp 98 F (36.7 C)   Resp 17   Ht 5' 1"$  (1.549 m)   Wt 61.2 kg   SpO2 98%   BMI 25.51 kg/m  Physical Exam Constitutional:      Appearance: She is well-developed.  HENT:     Head: Normocephalic and atraumatic.  Eyes:     Pupils: Pupils are equal, round, and reactive to light.  Cardiovascular:     Rate and Rhythm: Normal rate and regular rhythm.     Heart sounds: Normal heart sounds.  Pulmonary:     Effort: Pulmonary effort is normal. No respiratory distress.     Breath sounds: Normal breath sounds. No wheezing or rales.  Chest:     Chest wall: No tenderness.  Abdominal:     General: Bowel sounds are normal.     Palpations: Abdomen is soft.     Tenderness: There is no abdominal tenderness. There is no guarding or rebound.  Musculoskeletal:        General: Tenderness present. Normal range of motion.     Cervical back: Normal range of motion and neck supple.     Comments: Positive tenderness in her mid and lower back around the lower thoracic and upper lumbar spine area.  There is no tenderness to her upper back along the remainder of the thoracic spine.  No step-offs or deformities are noted.  There are some muscular tenderness mostly on the right side of her upper back in  the thoracic region.  Lymphadenopathy:     Cervical: No cervical adenopathy.  Skin:    General: Skin is warm and dry.     Findings: No rash.  Neurological:     Mental Status: She is alert and oriented to person, place, and time.     Comments: Motor 5 out of 5 all extremities, sensation  grossly intact to light touch all extremities, pedal pulses are intact     ED Results / Procedures / Treatments   Labs (all labs ordered are listed, but only abnormal results are displayed) Labs Reviewed - No data to display  EKG None  Radiology No results found.  Procedures Procedures    Medications Ordered in ED Medications  morphine (PF) 4 MG/ML injection 4 mg (4 mg Intravenous Given 09/16/22 1659)  ondansetron (ZOFRAN) injection 4 mg (4 mg Intravenous Given 09/16/22 1652)  diazepam (VALIUM) injection 5 mg (5 mg Intravenous Given 09/16/22 1826)  fentaNYL (SUBLIMAZE) injection 50 mcg (50 mcg Intravenous Given 09/16/22 1932)    ED Course/ Medical Decision Making/ A&P                             Medical Decision Making Risk Prescription drug management.   Patient is a 56 who presents with back pain.  It seems to be in her mid and lower back.  She had a CT scan of her abdomen pelvis recently which showed a T10 compression fracture.  She does not have any pain in the remainder of her thoracic spine so I do not feel that further imaging of her thoracic spine is indicated.  She does not have any radiation down her legs or other neurologic deficits.  No red flags that would be more concerning for other etiologies such as epidural hematoma or infection.  No fevers.  Will try to manage her pain.  I did speak with the APP on-call for Dr. Arnoldo Morale who recommended a TLSO.  Will order this.    Patient seems to be much better after treatment in the ED.  She is able to sit up with much less discomfort.  TLSO brace was placed.  She initially was given morphine and then a dose of Valium.  She seemed to get  much more relief from the Valium than the opioid pain medication.  She request prescription for this.  She did not seem to have any relief with muscle relaxer she tried in the past.  I did see that she is on Xanax for stress.  However I did advise her not to take the Valium while she is taking the Xanax.  She says she only takes it rarely anyway.  But she acknowledges not to take them together.  Will give her a short prescription for Valium.  She does have family members that will stay on it with her while she is taking the Valium.  Encouraged her to call make an appointment with Dr. Arnoldo Morale.  Return precautions were given.  Final Clinical Impression(s) / ED Diagnoses Final diagnoses:  Closed wedge compression fracture of T10 vertebra, initial encounter ([place])    Rx / DC Orders ED Discharge Orders          Ordered    diazepam (VALIUM) 5 MG tablet  Every 12 hours PRN        09/16/22 2027              Malvin Johns, MD 09/16/22 2030

## 2022-09-16 NOTE — Discharge Instructions (Signed)
Do not take your Xanax while you are taking the Valium.  Continue to use Tylenol.  Call to make an appointment to follow-up with the neurosurgeon listed above.  Return to the emergency room if you have any worsening symptoms.

## 2022-09-16 NOTE — ED Triage Notes (Signed)
Pt BIB [facility] coming from home. Pt c/o acute on chronic severe back pain over the entire back that started while she was showering. Pt had a CT scan last week where they noted pt had 2 compression fx.   EMS Vital   138/72 HR 90 RR 18 SpO2 97% on R/A

## 2022-09-16 NOTE — ED Notes (Signed)
Pt teaching provided on medications that may cause drowsiness. Pt instructed not to drive or operate heavy machinery while taking the prescribed medication. Pt verbalized understanding.   Pt provided discharge instructions and prescription information. Pt was given the opportunity to ask questions and questions were answered.   

## 2022-09-16 NOTE — ED Notes (Signed)
While attempting to transfer pt. Pt became nauseated and had one episode of emesis. EDP notified and new order received.

## 2022-09-16 NOTE — ED Notes (Signed)
Called to have TLSO brace placed for pt

## 2022-09-18 DIAGNOSIS — S22080G Wedge compression fracture of T11-T12 vertebra, subsequent encounter for fracture with delayed healing: Secondary | ICD-10-CM | POA: Diagnosis not present

## 2022-09-19 ENCOUNTER — Other Ambulatory Visit: Payer: Self-pay

## 2022-09-19 ENCOUNTER — Encounter (HOSPITAL_COMMUNITY): Payer: Self-pay | Admitting: Emergency Medicine

## 2022-09-19 ENCOUNTER — Emergency Department (HOSPITAL_COMMUNITY)
Admission: EM | Admit: 2022-09-19 | Discharge: 2022-09-19 | Disposition: A | Discharge status: Home / Self Care | Payer: Medicare Other | Attending: Emergency Medicine | Admitting: Emergency Medicine

## 2022-09-19 ENCOUNTER — Other Ambulatory Visit: Payer: Self-pay | Admitting: Family Medicine

## 2022-09-19 DIAGNOSIS — R1111 Vomiting without nausea: Secondary | ICD-10-CM | POA: Diagnosis not present

## 2022-09-19 DIAGNOSIS — Z7982 Long term (current) use of aspirin: Secondary | ICD-10-CM | POA: Insufficient documentation

## 2022-09-19 DIAGNOSIS — Z91040 Latex allergy status: Secondary | ICD-10-CM | POA: Insufficient documentation

## 2022-09-19 DIAGNOSIS — I10 Essential (primary) hypertension: Secondary | ICD-10-CM | POA: Diagnosis not present

## 2022-09-19 DIAGNOSIS — R9389 Abnormal findings on diagnostic imaging of other specified body structures: Secondary | ICD-10-CM

## 2022-09-19 DIAGNOSIS — R112 Nausea with vomiting, unspecified: Secondary | ICD-10-CM | POA: Diagnosis not present

## 2022-09-19 LAB — COMPREHENSIVE METABOLIC PANEL
ALT: 17 U/L (ref 0–44)
AST: 20 U/L (ref 15–41)
Albumin: 4.4 g/dL (ref 3.5–5.0)
Alkaline Phosphatase: 87 U/L (ref 38–126)
Anion gap: 9 (ref 5–15)
BUN: 12 mg/dL (ref 8–23)
CO2: 25 mmol/L (ref 22–32)
Calcium: 8.8 mg/dL — ABNORMAL LOW (ref 8.9–10.3)
Chloride: 105 mmol/L (ref 98–111)
Creatinine, Ser: 0.68 mg/dL (ref 0.44–1.00)
GFR, Estimated: >60 mL/min (ref >60)
Glucose, Bld: 190 mg/dL — ABNORMAL HIGH (ref 70–99)
Potassium: 3.5 mmol/L (ref 3.5–5.1)
Sodium: 139 mmol/L (ref 135–145)
Total Bilirubin: 0.8 mg/dL (ref 0.3–1.2)
Total Protein: 6.9 g/dL (ref 6.5–8.1)

## 2022-09-19 LAB — CBC
HCT: 36.3 % (ref 36.0–46.0)
Hemoglobin: 12.5 g/dL (ref 12.0–15.0)
MCH: 34.9 pg — ABNORMAL HIGH (ref 26.0–34.0)
MCHC: 34.4 g/dL (ref 30.0–36.0)
MCV: 101.4 fL — ABNORMAL HIGH (ref 80.0–100.0)
Platelets: 206 10*3/uL (ref 150–400)
RBC: 3.58 MIL/uL — ABNORMAL LOW (ref 3.87–5.11)
RDW: 13.4 % (ref 11.5–15.5)
WBC: 6.6 10*3/uL (ref 4.0–10.5)
nRBC: 0 % (ref 0.0–0.2)

## 2022-09-19 LAB — LIPASE, BLOOD: Lipase: 30 U/L (ref 11–51)

## 2022-09-19 MED ORDER — ONDANSETRON HCL 4 MG/2ML IJ SOLN
4.0000 mg | Freq: Once | INTRAMUSCULAR | Status: AC
  Administered 2022-09-19: 4 mg via INTRAVENOUS
  Filled 2022-09-19: qty 2

## 2022-09-19 MED ORDER — SODIUM CHLORIDE 0.9 % IV BOLUS
500.0000 mL | Freq: Once | INTRAVENOUS | Status: AC
  Administered 2022-09-19: 500 mL via INTRAVENOUS

## 2022-09-19 NOTE — ED Provider Triage Note (Signed)
Emergency Medicine Provider Triage Evaluation Note  Alice Anthony , a 82 y.o. female  was evaluated in triage.  Pt complains of nausea and vomiting. Symptoms began about 1 hour before arrival. X5 episodes of vomiting. Nonbloody. Denies abdominal pain. She did just take her first dose of Valium today prior to arrival on empty stomach. Denies fever, diarrhea, dysuria, possible contaminated foods, chest pain. Remote history of abdominal surgery.   Review of Systems  Positive: See above Negative:   Physical Exam  BP (!) 144/59   Pulse 90   Temp 98 F (36.7 C) (Oral)   Resp 20   Ht 5' 1"$  (1.549 m)   Wt 61.2 kg   SpO2 97%   BMI 25.51 kg/m  Gen:   Awake, no distress   Resp:  Normal effort  MSK:   Moves extremities without difficulty  Other:  Uncomfortable appearing, nauseated,   Medical Decision Making  Medically screening exam initiated at 1:47 PM.  Appropriate orders placed.  Verner Chol was informed that the remainder of the evaluation will be completed by another provider, this initial triage assessment does not replace that evaluation, and the importance of remaining in the ED until their evaluation is complete.     Mickie Hillier, PA-C 09/19/22 1349

## 2022-09-19 NOTE — ED Notes (Signed)
Pt tolerating PO food/liquids without complaints of N/V.

## 2022-09-19 NOTE — ED Triage Notes (Signed)
Per [facility] pt coming from home c/o nausea and vomiting this morning. EMS administered 49m zofran en route. 20G LFA.

## 2022-09-19 NOTE — ED Provider Notes (Signed)
[place] AT [facility] Provider Note   CSN: [id number] Arrival date & time: 09/19/22  1307     History  Chief Complaint  Patient presents with   Emesis    Alice Anthony is a 82 y.o. female.  HPI   82 year old female presents emergency department after episodes of nausea/vomiting.  Patient was recently diagnosed with multiple compression fractures.  She was prescribed Norco and Valium.  She states this morning she woke up and on an empty stomach took 2 Norco medication and 1 Valium.  This resulted in multiple episodes of vomiting.  Patient states currently she feels better and back to baseline, complaining of her chronic back pain.  No other fever, chest pain, diarrhea, abdominal pain.  Home Medications Prior to Admission medications   Medication Sig Start Date End Date Taking? Authorizing Provider  ALPRAZolam (XANAX) 0.25 MG tablet Take 0.125-0.25 mg by mouth daily as needed for sleep or anxiety.    [provider]  amLODipine (NORVASC) 5 MG tablet Take 5 mg by mouth daily.    [provider]  Ascorbic Acid (VITAMIN C) 1000 MG tablet Take 1,000 mg by mouth daily.    [provider]  aspirin 81 MG tablet Take 81 mg by mouth daily.    [provider]  calcium carbonate (TUMS - DOSED IN MG ELEMENTAL CALCIUM) 500 MG chewable tablet Chew 1 tablet by mouth 2 (two) times daily.    [provider]  Calcium Carbonate-Vitamin D (CALCIUM PLUS VITAMIN D PO) Take 1 tablet by mouth 2 (two) times daily.    [provider]  cetirizine (ZYRTEC) 10 MG tablet Take 10 mg by mouth at bedtime. REQUIRED NIGHTLY.    [provider]  Cholecalciferol (VITAMIN D3) 2000 UNITS capsule Take 2,000 Units by mouth daily.    [provider]  diazepam (VALIUM) 5 MG tablet Take 1 tablet (5 mg total) by mouth every 12 (twelve) hours as needed for muscle spasms. 09/16/22   Malvin Johns, MD  FIBER ADULT GUMMIES PO  Take 3 tablets by mouth daily.    [provider]  fluticasone (FLONASE) 50 MCG/ACT nasal spray Place 2 sprays into the nose at bedtime. REQUIRED NIGHTLY    [provider]  HYDROcodone-acetaminophen (NORCO/VICODIN) 5-325 MG tablet Take 1 tablet by mouth every 4 (four) hours as needed. 10/20/18   Tacy Learn, PA-C  hydrOXYzine (ATARAX/VISTARIL) 10 MG tablet Take 10 mg by mouth at bedtime. REQUIRED NIGHTLY.    [provider]  KRILL OIL PO Take 1 tablet by mouth daily.    [provider]  Multiple Vitamin (MULTIVITAMIN WITH MINERALS) TABS Take 1 tablet by mouth 2 (two) times daily.    [provider]  ondansetron (ZOFRAN ODT) 4 MG disintegrating tablet Take 1 tablet (4 mg total) by mouth every 8 (eight) hours as needed for nausea or vomiting. 10/20/18   Tacy Learn, PA-C  ondansetron (ZOFRAN) 4 MG tablet Take 4 mg by mouth every 8 (eight) hours as needed for nausea or vomiting.    [provider]  Probiotic Product (ALIGN) 4 MG CAPS Take by mouth.    [provider]  simvastatin (ZOCOR) 20 MG tablet Take 20 mg by mouth every evening.    [provider]      Allergies    Vioxx [rofecoxib], Codeine, Doxycycline hyclate, Fosamax [alendronate sodium], and Latex    Review of Systems   Review of Systems  Constitutional:  Negative for fever.  Respiratory:  Negative for shortness of breath.   Cardiovascular:  Negative for chest pain.  Gastrointestinal:  Positive for nausea and vomiting. Negative for abdominal pain and diarrhea.  Skin:  Negative for rash.  Neurological:  Negative for headaches.    Physical Exam Updated Vital Signs BP (!) 151/72   Pulse 100   Temp 98 F (36.7 C) (Oral)   Resp 11   Ht 5' 1"$  (1.549 m)   Wt 61.2 kg   SpO2 97%   BMI 25.51 kg/m  Physical Exam Vitals and nursing note reviewed.  Constitutional:      General: She is not in acute distress.    Appearance: Normal appearance. She is not  ill-appearing.  HENT:     Head: Normocephalic.     Mouth/Throat:     Mouth: Mucous membranes are moist.  Cardiovascular:     Rate and Rhythm: Normal rate.  Pulmonary:     Effort: Pulmonary effort is normal. No respiratory distress.  Abdominal:     General: There is no distension.     Palpations: Abdomen is soft.     Tenderness: There is no abdominal tenderness. There is no guarding.  Skin:    General: Skin is warm.  Neurological:     Mental Status: She is alert and oriented to person, place, and time. Mental status is at baseline.  Psychiatric:        Mood and Affect: Mood normal.     ED Results / Procedures / Treatments   Labs (all labs ordered are listed, but only abnormal results are displayed) Labs Reviewed  COMPREHENSIVE METABOLIC PANEL - Abnormal; Notable for the following components:      Result Value   Glucose, Bld 190 (*)    Calcium 8.8 (*)    All other components within normal limits  CBC - Abnormal; Notable for the following components:   RBC 3.58 (*)    MCV 101.4 (*)    MCH 34.9 (*)    All other components within normal limits  LIPASE, BLOOD  URINALYSIS, ROUTINE W REFLEX MICROSCOPIC    EKG EKG Interpretation  Date/Time:  Wednesday September 19 2022 14:02:07 EST Ventricular Rate:  94 PR Interval:  166 QRS Duration: 84 QT Interval:  381 QTC Calculation: 477 R Axis:   -7 Text Interpretation: Sinus rhythm Low voltage, precordial leads Abnormal R-wave progression, early transition Confirmed by Lavenia Atlas [phone]) on 09/19/2022 3:32:38 PM  Radiology No results found.  Procedures Procedures    Medications Ordered in ED Medications  sodium chloride 0.9 % bolus 500 mL (500 mLs Intravenous New Bag/Given 09/19/22 1543)  ondansetron (ZOFRAN) injection 4 mg (4 mg Intravenous Given 09/19/22 1542)    ED Course/ Medical Decision Making/ A&P                             Medical Decision Making Amount and/or Complexity of Data Reviewed Labs:  ordered.  Risk Prescription drug management.   82 year old female presents emergency department with nausea/vomiting after taking pain medicine and mixing it with Valium on an empty stomach.  This is most likely the cause of her vomiting.  Otherwise her vitals are normal.  Abdomen is benign.  She has no other acute complaints.  Patient was hydrated, given nausea medicine she is been able to eat and drink without difficulty.  She feels back to baseline.  I have educated the patient on more appropriate Norco/Valium  regimen as needed.  Patient at this time appears safe and stable for discharge and close outpatient follow up. Discharge plan and strict return to ED precautions discussed, patient verbalizes understanding and agreement.        Final Clinical Impression(s) / ED Diagnoses Final diagnoses:  Vomiting without nausea, unspecified vomiting type    Rx / DC Orders ED Discharge Orders     None         Lorelle Gibbs, DO 09/19/22 1716

## 2022-09-19 NOTE — Discharge Instructions (Signed)
You have been seen and discharged from the emergency department.  I believe that your vomiting was secondary to the multiple medications that you took.  In regards to the [place].  Take this every 4-6 hours as needed.  You may take ibuprofen on top of this every 6 hours.  Make sure you eat a small amount when taking this medication.  About 2 hours following this medication you may take Valium for further relief.  Do not mix this medication with alcohol or other sedating medications. Do not drive or do heavy physical activity until you know how this medication affects you.  It may cause drowsiness.  Follow-up with your primary provider for further evaluation and further care. Take home medications as prescribed. If you have any worsening symptoms or further concerns for your health please return to an emergency department for further evaluation.

## 2022-09-20 ENCOUNTER — Other Ambulatory Visit (HOSPITAL_COMMUNITY): Payer: Self-pay | Admitting: Neurosurgery

## 2022-09-20 DIAGNOSIS — S22080G Wedge compression fracture of T11-T12 vertebra, subsequent encounter for fracture with delayed healing: Secondary | ICD-10-CM

## 2022-09-22 ENCOUNTER — Ambulatory Visit (HOSPITAL_COMMUNITY)
Admission: RE | Admit: 2022-09-22 | Discharge: 2022-09-22 | Disposition: A | Discharge status: Home / Self Care | Payer: Medicare Other | Source: Ambulatory Visit | Attending: Neurosurgery | Admitting: Neurosurgery

## 2022-09-22 DIAGNOSIS — M40204 Unspecified kyphosis, thoracic region: Secondary | ICD-10-CM | POA: Diagnosis not present

## 2022-09-22 DIAGNOSIS — M47814 Spondylosis without myelopathy or radiculopathy, thoracic region: Secondary | ICD-10-CM | POA: Diagnosis not present

## 2022-09-22 DIAGNOSIS — S22080G Wedge compression fracture of T11-T12 vertebra, subsequent encounter for fracture with delayed healing: Secondary | ICD-10-CM | POA: Insufficient documentation

## 2022-10-01 ENCOUNTER — Other Ambulatory Visit: Payer: Self-pay | Admitting: Neurosurgery

## 2022-10-03 ENCOUNTER — Emergency Department (HOSPITAL_COMMUNITY)
Admission: EM | Admit: 2022-10-03 | Discharge: 2022-10-03 | Disposition: A | Discharge status: Home / Self Care | Payer: Medicare Other | Attending: Emergency Medicine | Admitting: Emergency Medicine

## 2022-10-03 ENCOUNTER — Encounter (HOSPITAL_COMMUNITY): Payer: Self-pay | Admitting: Emergency Medicine

## 2022-10-03 ENCOUNTER — Emergency Department (HOSPITAL_COMMUNITY): Payer: Medicare Other

## 2022-10-03 DIAGNOSIS — M549 Dorsalgia, unspecified: Secondary | ICD-10-CM | POA: Diagnosis not present

## 2022-10-03 DIAGNOSIS — Z91040 Latex allergy status: Secondary | ICD-10-CM | POA: Insufficient documentation

## 2022-10-03 DIAGNOSIS — R109 Unspecified abdominal pain: Secondary | ICD-10-CM | POA: Diagnosis not present

## 2022-10-03 DIAGNOSIS — Z7982 Long term (current) use of aspirin: Secondary | ICD-10-CM | POA: Diagnosis not present

## 2022-10-03 DIAGNOSIS — R1084 Generalized abdominal pain: Secondary | ICD-10-CM | POA: Diagnosis not present

## 2022-10-03 DIAGNOSIS — M545 Low back pain, unspecified: Secondary | ICD-10-CM | POA: Diagnosis not present

## 2022-10-03 DIAGNOSIS — R1111 Vomiting without nausea: Secondary | ICD-10-CM | POA: Diagnosis not present

## 2022-10-03 DIAGNOSIS — R1110 Vomiting, unspecified: Secondary | ICD-10-CM | POA: Diagnosis not present

## 2022-10-03 LAB — CBC WITH DIFFERENTIAL/PLATELET
Abs Immature Granulocytes: 0.02 10*3/uL (ref 0.00–0.07)
Basophils Absolute: 0 10*3/uL (ref 0.0–0.1)
Basophils Relative: 1 %
Eosinophils Absolute: 0 10*3/uL (ref 0.0–0.5)
Eosinophils Relative: 1 %
HCT: 34.8 % — ABNORMAL LOW (ref 36.0–46.0)
Hemoglobin: 11.9 g/dL — ABNORMAL LOW (ref 12.0–15.0)
Immature Granulocytes: 1 %
Lymphocytes Relative: 22 %
Lymphs Abs: 0.9 10*3/uL (ref 0.7–4.0)
MCH: 34.2 pg — ABNORMAL HIGH (ref 26.0–34.0)
MCHC: 34.2 g/dL (ref 30.0–36.0)
MCV: 100 fL (ref 80.0–100.0)
Monocytes Absolute: 0.3 10*3/uL (ref 0.1–1.0)
Monocytes Relative: 6 %
Neutro Abs: 2.9 10*3/uL (ref 1.7–7.7)
Neutrophils Relative %: 69 %
Platelets: 201 10*3/uL (ref 150–400)
RBC: 3.48 MIL/uL — ABNORMAL LOW (ref 3.87–5.11)
RDW: 13 % (ref 11.5–15.5)
WBC: 4.2 10*3/uL (ref 4.0–10.5)
nRBC: 0 % (ref 0.0–0.2)

## 2022-10-03 LAB — COMPREHENSIVE METABOLIC PANEL
ALT: 16 U/L (ref 0–44)
AST: 24 U/L (ref 15–41)
Albumin: 4 g/dL (ref 3.5–5.0)
Alkaline Phosphatase: 86 U/L (ref 38–126)
Anion gap: 12 (ref 5–15)
BUN: 7 mg/dL — ABNORMAL LOW (ref 8–23)
CO2: 26 mmol/L (ref 22–32)
Calcium: 9.7 mg/dL (ref 8.9–10.3)
Chloride: 103 mmol/L (ref 98–111)
Creatinine, Ser: 0.77 mg/dL (ref 0.44–1.00)
GFR, Estimated: >60 mL/min (ref >60)
Glucose, Bld: 174 mg/dL — ABNORMAL HIGH (ref 70–99)
Potassium: 3.6 mmol/L (ref 3.5–5.1)
Sodium: 141 mmol/L (ref 135–145)
Total Bilirubin: 0.9 mg/dL (ref 0.3–1.2)
Total Protein: 6.1 g/dL — ABNORMAL LOW (ref 6.5–8.1)

## 2022-10-03 LAB — I-STAT CHEM 8, ED
BUN: 7 mg/dL — ABNORMAL LOW (ref 8–23)
Calcium, Ion: 1.22 mmol/L (ref 1.15–1.40)
Chloride: 103 mmol/L (ref 98–111)
Creatinine, Ser: 0.6 mg/dL (ref 0.44–1.00)
Glucose, Bld: 167 mg/dL — ABNORMAL HIGH (ref 70–99)
HCT: 34 % — ABNORMAL LOW (ref 36.0–46.0)
Hemoglobin: 11.6 g/dL — ABNORMAL LOW (ref 12.0–15.0)
Potassium: 3.7 mmol/L (ref 3.5–5.1)
Sodium: 141 mmol/L (ref 135–145)
TCO2: 27 mmol/L (ref 22–32)

## 2022-10-03 LAB — LIPASE, BLOOD: Lipase: 26 U/L (ref 11–51)

## 2022-10-03 MED ORDER — IOHEXOL 350 MG/ML SOLN
75.0000 mL | Freq: Once | INTRAVENOUS | Status: AC | PRN
  Administered 2022-10-03: 75 mL via INTRAVENOUS

## 2022-10-03 MED ORDER — SODIUM CHLORIDE 0.9 % IV SOLN: INTRAVENOUS | Status: DC

## 2022-10-03 MED ORDER — ONDANSETRON HCL 4 MG/2ML IJ SOLN
4.0000 mg | Freq: Once | INTRAMUSCULAR | Status: AC
  Administered 2022-10-03: 4 mg via INTRAVENOUS
  Filled 2022-10-03: qty 2

## 2022-10-03 MED ORDER — METOCLOPRAMIDE HCL 5 MG/ML IJ SOLN
10.0000 mg | Freq: Once | INTRAMUSCULAR | Status: AC
  Administered 2022-10-03: 10 mg via INTRAVENOUS
  Filled 2022-10-03: qty 2

## 2022-10-03 MED ORDER — ONDANSETRON 4 MG PO TBDP: 4.0000 mg | ORAL_TABLET | Freq: Three times a day (TID) | ORAL | 0 refills | Status: DC | PRN

## 2022-10-03 MED ORDER — FENTANYL CITRATE PF 50 MCG/ML IJ SOSY
25.0000 ug | PREFILLED_SYRINGE | Freq: Once | INTRAMUSCULAR | Status: AC
  Administered 2022-10-03: 25 ug via INTRAVENOUS
  Filled 2022-10-03: qty 1

## 2022-10-03 MED ORDER — METOCLOPRAMIDE HCL 10 MG PO TABS: 10.0000 mg | ORAL_TABLET | Freq: Four times a day (QID) | ORAL | 0 refills | Status: DC

## 2022-10-03 NOTE — Discharge Instructions (Signed)
As discussed, your evaluation today has been largely reassuring.  But, it is important that you monitor your condition carefully, and do not hesitate to return to the ED if you develop new, or concerning changes in your condition.  In addition to your pain medication please obtain and use medicated patches including the ingredient methyl salicylate.  Alice Anthony is 1 manufacturer of this product.  Otherwise, please follow-up with your physician for appropriate ongoing care.

## 2022-10-03 NOTE — ED Notes (Signed)
Pt was able to climb out of bed and go to the restroom w/o emesis.

## 2022-10-03 NOTE — ED Provider Notes (Signed)
[facility] Provider Note   CSN: [id number] Arrival date & time: 10/03/22  1231     History  Chief Complaint  Patient presents with   Abdominal Pain    Alice Anthony is a 82 y.o. female.  HPI Patient with multiple medical problems including prior partial colectomy, and anticipated surgery for lumbar spine compression fractures next week presents with abdominal pain, nausea, vomiting, p.o. intolerance.  Notes that she has had nausea, vomiting multiple times over the past 2 weeks, some attributed to ongoing pain medication necessity for her back fractures.  Today, however, without notable change in diet, activity, medication she began having abdominal pain diffusely, had episodes of nausea, vomiting.  EMS reports some improvement with Zofran, 150 mL fluid.  No fever. She notes she has had a change in bowel movements recently, though she also started MiraLAX.     Home Medications Prior to Admission medications   Medication Sig Start Date End Date Taking? Authorizing Provider  ALPRAZolam (XANAX) 0.25 MG tablet Take 0.125-0.25 mg by mouth daily as needed for sleep or anxiety.    [provider]  amLODipine (NORVASC) 5 MG tablet Take 5 mg by mouth daily.    [provider]  Ascorbic Acid (VITAMIN C) 1000 MG tablet Take 1,000 mg by mouth daily.    [provider]  aspirin 81 MG tablet Take 81 mg by mouth daily.    [provider]  calcium carbonate (TUMS - DOSED IN MG ELEMENTAL CALCIUM) 500 MG chewable tablet Chew 1 tablet by mouth 2 (two) times daily.    [provider]  Calcium Carbonate-Vitamin D (CALCIUM PLUS VITAMIN D PO) Take 1 tablet by mouth 2 (two) times daily.    [provider]  cetirizine (ZYRTEC) 10 MG tablet Take 10 mg by mouth at bedtime. REQUIRED NIGHTLY.    [provider]  Cholecalciferol (VITAMIN D3) 2000 UNITS capsule Take 2,000 Units by mouth daily.    [provider]  diazepam (VALIUM) 5 MG tablet Take 1 tablet (5 mg total) by mouth every 12 (twelve) hours as needed for muscle spasms. 09/16/22   Malvin Johns, MD  FIBER ADULT GUMMIES PO Take 3 tablets by mouth daily.    [provider]  fluticasone (FLONASE) 50 MCG/ACT nasal spray Place 2 sprays into the nose at bedtime. REQUIRED NIGHTLY    [provider]  HYDROcodone-acetaminophen (NORCO/VICODIN) 5-325 MG tablet Take 1 tablet by mouth every 4 (four) hours as needed. 10/20/18   Tacy Learn, PA-C  hydrOXYzine (ATARAX/VISTARIL) 10 MG tablet Take 10 mg by mouth at bedtime. REQUIRED NIGHTLY.    [provider]  KRILL OIL PO Take 1 tablet by mouth daily.    [provider]  Multiple Vitamin (MULTIVITAMIN WITH MINERALS) TABS Take 1 tablet by mouth 2 (two) times daily.    [provider]  ondansetron (ZOFRAN ODT) 4 MG disintegrating tablet Take 1 tablet (4 mg total) by mouth every 8 (eight) hours as needed for nausea or vomiting. 10/03/22   Carmin Muskrat, MD  ondansetron (ZOFRAN) 4 MG tablet Take 4 mg by mouth every 8 (eight) hours as needed for nausea or vomiting.    [provider]  Probiotic Product (ALIGN) 4 MG CAPS Take by mouth.    [provider]  simvastatin (ZOCOR) 20 MG tablet Take 20 mg by mouth every evening.    [provider]      Allergies  Vioxx [rofecoxib], Codeine, Doxycycline hyclate, Fosamax [alendronate sodium], and Latex    Review of Systems   Review of Systems  All other systems reviewed and are negative.   Physical Exam Updated Vital Signs BP (!) 154/83   Pulse (!) 104   Temp 98 F (36.7 C)   Resp 16   SpO2 98%  Physical Exam Vitals and nursing note reviewed.  Constitutional:      General: She is not in acute distress.    Appearance: She is well-developed.  HENT:     Head: Normocephalic and atraumatic.  Eyes:     Conjunctiva/sclera: Conjunctivae normal.  Cardiovascular:     Rate  and Rhythm: Normal rate and regular rhythm.  Pulmonary:     Effort: Pulmonary effort is normal. No respiratory distress.     Breath sounds: Normal breath sounds. No stridor.  Abdominal:     General: There is no distension.     Tenderness: There is abdominal tenderness. There is guarding.  Skin:    General: Skin is warm and dry.  Neurological:     Mental Status: She is alert and oriented to person, place, and time.     Cranial Nerves: No cranial nerve deficit.  Psychiatric:        Mood and Affect: Mood normal.     ED Results / Procedures / Treatments   Labs (all labs ordered are listed, but only abnormal results are displayed) Labs Reviewed  COMPREHENSIVE METABOLIC PANEL - Abnormal; Notable for the following components:      Result Value   Glucose, Bld 174 (*)    BUN 7 (*)    Total Protein 6.1 (*)    All other components within normal limits  CBC WITH DIFFERENTIAL/PLATELET - Abnormal; Notable for the following components:   RBC 3.48 (*)    Hemoglobin 11.9 (*)    HCT 34.8 (*)    MCH 34.2 (*)    All other components within normal limits  I-STAT CHEM 8, ED - Abnormal; Notable for the following components:   BUN 7 (*)    Glucose, Bld 167 (*)    Hemoglobin 11.6 (*)    HCT 34.0 (*)    All other components within normal limits  LIPASE, BLOOD  URINALYSIS, ROUTINE W REFLEX MICROSCOPIC    EKG None  Radiology CT ABDOMEN PELVIS W CONTRAST  Result Date: 10/03/2022 CLINICAL DATA:  Abdominal and low back pain with nausea and vomiting for a week EXAM: CT ABDOMEN AND PELVIS WITH CONTRAST TECHNIQUE: Multidetector CT imaging of the abdomen and pelvis was performed using the standard protocol following bolus administration of intravenous contrast. RADIATION DOSE REDUCTION: This exam was performed according to the departmental dose-optimization program which includes automated exposure control, adjustment of the mA and/or kV according to patient size and/or use of iterative reconstruction  technique. CONTRAST:  92m OMNIPAQUE IOHEXOL 350 MG/ML SOLN COMPARISON:  CT 09/10/2022 and older FINDINGS: Lower chest: There is some linear opacity lung bases likely scar or atelectasis. No pleural effusion. Hepatobiliary: Fatty liver infiltration. Patent portal vein. No space-occupying liver lesion. Dependent stones in the nondilated gallbladder. Pancreas: Mild pancreatic atrophy. No mass lesion or ductal dilatation. Spleen: Spleen is nonenlarged. Preserved enhancement. Small splenule. Adrenals/Urinary Tract: Adrenal glands are preserved. No enhancing renal mass or collecting system dilatation. Few bilateral parapelvic renal cysts are identified. The ureters have normal course and caliber down to the bladder. Preserved contour to the urinary bladder. Appearance of the base of the bladder as well as  other pelvic structures. Please correlate for pelvic prolapse. Stomach/Bowel: Surgical changes along the rectosigmoid colon with primary anastomosis. Scattered left-sided colonic diverticula are seen. Scattered colonic stool. No bowel dilatation or signs of obstruction of the colon. Normal appendix extends medial and inferior to the cecum in the anterior right hemipelvis. Stomach is mildly distended with fluid. Small bowel is nondilated. Slightly atypical course of the proximal jejunum but unchanged from prior. Vascular/Lymphatic: Scattered vascular calcifications along the aorta. Normal caliber aorta and IVC. No specific abnormal lymph node enlargement identified in the abdomen and pelvis. Reproductive: Uterine fibroid posterior to the left. No separate adnexal soft tissue mass. Other: No free air or free fluid. Small umbilical fat containing hernia. 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 This has been present since at least 2014 with only slight increase in size. Almost no soft  tissue elements by CT. Mild compression injury involving the superior endplate of L3. This is new from 09/10/2022. Was discussed on the MRI of the thoracic spine on 09/22/2022 please correlate for an acute injury. There is minimal displacement of the superior endplate towards the central canal. Chronic appearing more severe compression noted at T10 IMPRESSION: No bowel obstruction, free air or free fluid. Left-sided colonic diverticula. Surgical changes along the rectosigmoid colon. Fatty liver infiltration. Gallstones in the nondilated gallbladder. Small hiatal hernia. Uterine fibroid. Once again acute appearing compression to subacute of the L3 vertebral body as described on prior MRI of 09/22/2022 Electronically Signed   By: Jill Side M.D.   On: 10/03/2022 15:11    Procedures Procedures    Medications Ordered in ED Medications  0.9 %  sodium chloride infusion ( Intravenous New Bag/Given 10/03/22 1313)  fentaNYL (SUBLIMAZE) injection 25 mcg (25 mcg Intravenous Given 10/03/22 1313)  ondansetron (ZOFRAN) injection 4 mg (4 mg Intravenous Given 10/03/22 1313)  fentaNYL (SUBLIMAZE) injection 25 mcg (25 mcg Intravenous Given 10/03/22 1534)  ondansetron (ZOFRAN) injection 4 mg (4 mg Intravenous Given 10/03/22 1533)  iohexol (OMNIPAQUE) 350 MG/ML injection 75 mL (75 mLs Intravenous Contrast Given 10/03/22 1445)    ED Course/ Medical Decision Making/ A&P                             Medical Decision Making Female with multiple medical problems including colectomy, compression fractures of her spine, ongoing narcotic use presents with abdominal pain nausea, vomiting.  Differential includes iatrogenic nausea, vomiting, pain, lumbar compression spine radiculopathy, bowel obstruction, infection.  Patient on continuous cardiac monitoring, pulse oximetry, CT labs fluids analgesics antiemetics ordered.  Cardiac 90 sinus normal Pulse ox 100% room air normal   Amount and/or Complexity of Data  Reviewed External Data Reviewed: notes.    Details: Anticipated neurosurgical procedure next week Labs: ordered. Decision-making details documented in ED Course. Radiology: ordered and independent interpretation performed. Decision-making details documented in ED Course.  Risk Prescription drug management.   3:43 PM Patient accompanied by her daughter.  We discussed all findings at length, and I reviewed her CT imaging.  Labs unremarkable, CT without evidence for acute obstruction, diverticulitis, colitis or other acute intraintestinal phenomenon.  CT does then redemonstrated the patient's lumbar fracture, previously identified.  With family at length conversation about pain medication regimen, antiemetics, and the portance of following up with her surgeon for anticipated surgical repair next week.  Absent evidence for substantial dehydration, bacteremia,  sepsis with preserved distal neurovascular status, patient discharged to follow-up next week as scheduled.        Final Clinical Impression(s) / ED Diagnoses Final diagnoses:  Generalized abdominal pain    Rx / DC Orders ED Discharge Orders          Ordered    ondansetron (ZOFRAN ODT) 4 MG disintegrating tablet  Every 8 hours PRN        10/03/22 1543              Carmin Muskrat, MD 10/03/22 [phone]

## 2022-10-03 NOTE — ED Triage Notes (Signed)
Pt here from home with c/o abd pain and low back pain along with some n/v ,times 1 week , pt received 4 mg Zofran and 150 of fluid with ems

## 2022-10-03 NOTE — ED Notes (Signed)
Pt c/o chronic back pain, generalized abdominal pain, and n/v.  Pt is unsure how long symptoms have been going on and has been seen by several providers for same.  Pt reports abdominal pain and constipation got worse after changing to Miralax.

## 2022-10-03 NOTE — ED Notes (Signed)
Patient transported to CT 

## 2022-10-03 NOTE — ED Notes (Signed)
Pt verbalized understanding of discharge instructions. Opportunity for questions provided.  

## 2022-10-03 NOTE — ED Notes (Signed)
When attempting to discharge the Pt, she began profusely vomiting.  EDP made aware.

## 2022-10-08 ENCOUNTER — Encounter (HOSPITAL_COMMUNITY)
Admission: RE | Admit: 2022-10-08 | Discharge: 2022-10-08 | Disposition: A | Discharge status: Home / Self Care | Payer: Medicare Other | Source: Ambulatory Visit | Attending: Neurosurgery | Admitting: Neurosurgery

## 2022-10-08 ENCOUNTER — Encounter (HOSPITAL_COMMUNITY): Payer: Self-pay

## 2022-10-08 ENCOUNTER — Other Ambulatory Visit: Payer: Self-pay

## 2022-10-08 VITALS — BP 136/68 | HR 81 | Temp 98.0°F | Resp 17 | Ht 61.0 in | Wt 132.0 lb

## 2022-10-08 DIAGNOSIS — Z01818 Encounter for other preprocedural examination: Secondary | ICD-10-CM

## 2022-10-08 DIAGNOSIS — Z01812 Encounter for preprocedural laboratory examination: Secondary | ICD-10-CM | POA: Insufficient documentation

## 2022-10-08 HISTORY — DX: Unspecified osteoarthritis, unspecified site: M19.90

## 2022-10-08 HISTORY — DX: Prediabetes: R73.03

## 2022-10-08 LAB — SURGICAL PCR SCREEN
MRSA, PCR: NEGATIVE
Staphylococcus aureus: NEGATIVE

## 2022-10-08 NOTE — Progress Notes (Signed)
PCP - Dr. Jonathon Jordan Cardiologist - Denies  PPM/ICD - Denies Device Orders - n/a Rep Notified - n/a  Chest x-ray - n/a EKG - 09/20/2022 Stress Test - Denies ECHO - Denies Cardiac Cath - Denies  Sleep Study - Denies CPAP - n/a  Pt is Pre-DM. She states that she does take a diabetic medication as needed (either Glimepride or Glipizide) but seldom takes it and when she does it is half a tablet. RN instructed pt not tot to take any DM medications until after surgery. Pt and pts daughter understood instructions  Last dose of GLP1 agonist- n/a GLP1 instructions: n/a  Blood Thinner Instructions: n/a Aspirin Instructions: Pt has already stopped her ASA weeks ago. RN instructed pt to not restart medication until after surgery.  NPO after midnight  COVID TEST- n/a   Anesthesia review: No.   Patient denies shortness of breath, fever, cough and chest pain at PAT appointment. Pt denies any respiratory illness/infection in the last two months   All instructions explained to the patient, with a verbal understanding of the material. Patient agrees to go over the instructions while at home for a better understanding. Patient also instructed to self quarantine after being tested for COVID-19. The opportunity to ask questions was provided.

## 2022-10-08 NOTE — Pre-Procedure Instructions (Signed)
Surgical Instructions    Your procedure is scheduled on October 10, 2022.  Report to [facility] Main Entrance "A" at 11:30 A.M., then check in with the Admitting office.  Call this number if you have problems the morning of surgery:  [phone]  If you have any questions prior to your surgery date call [phone]: Open Monday-Friday 8am-4pm If you experience any cold or flu symptoms such as cough, fever, chills, shortness of breath, etc. between now and your scheduled surgery, please notify us at the above number.     Remember:  Do not eat or drink after midnight the night before your surgery     Take these medicines the morning of surgery with A SIP OF WATER:  amLODipine (NORVASC)   metoCLOPramide (REGLAN)    May take these medicines IF NEEDED:  ALPRAZolam Duanne Moron)   HYDROcodone-acetaminophen (NORCO/VICODIN)     As of today, STOP taking any Aspirin (unless otherwise instructed by your surgeon) Aleve, Naproxen, Ibuprofen, Motrin, Advil, Goody's, BC's, all herbal medications, fish oil, and all vitamins.                     Do NOT Smoke (Tobacco/Vaping) for 24 hours prior to your procedure.  If you use a CPAP at night, you may bring your mask/headgear for your overnight stay.   Contacts, glasses, piercing's, hearing aid's, dentures or partials may not be worn into surgery, please bring cases for these belongings.    For patients admitted to the hospital, discharge time will be determined by your treatment team.   Patients discharged the day of surgery will not be allowed to drive home, and someone needs to stay with them for 24 hours.  SURGICAL WAITING ROOM VISITATION Patients having surgery or a procedure may have no more than 2 support people in the waiting area - these visitors may rotate.   Children under the age of 29 must have an adult with them who is not the patient. If the patient needs to stay at the hospital during part of their recovery, the visitor guidelines for  inpatient rooms apply. Pre-op nurse will coordinate an appropriate time for 1 support person to accompany patient in pre-op.  This support person may not rotate.   Please refer to the [facility] website for the visitor guidelines for Inpatients (after your surgery is over and you are in a regular room).    Special instructions:   [place]- Preparing For Surgery  Before surgery, you can play an important role. Because skin is not sterile, your skin needs to be as free of germs as possible. You can reduce the number of germs on your skin by washing with CHG (chlorahexidine gluconate) Soap before surgery.  CHG is an antiseptic cleaner which kills germs and bonds with the skin to continue killing germs even after washing.    Oral Hygiene is also important to reduce your risk of infection.  Remember - BRUSH YOUR TEETH THE MORNING OF SURGERY WITH YOUR REGULAR TOOTHPASTE  Please do not use if you have an allergy to CHG or antibacterial soaps. If your skin becomes reddened/irritated stop using the CHG.  Do not shave (including legs and underarms) for at least 48 hours prior to first CHG shower. It is OK to shave your face.  Please follow these instructions carefully.   Shower the NIGHT BEFORE SURGERY and the MORNING OF SURGERY  If you chose to wash your hair, wash your hair first as usual with your normal  shampoo.  After you shampoo, rinse your hair and body thoroughly to remove the shampoo.  Use CHG Soap as you would any other liquid soap. You can apply CHG directly to the skin and wash gently with a scrungie or a clean washcloth.   Apply the CHG Soap to your body ONLY FROM THE NECK DOWN.  Do not use on open wounds or open sores. Avoid contact with your eyes, ears, mouth and genitals (private parts). Wash Face and genitals (private parts)  with your normal soap.   Wash thoroughly, paying special attention to the area where your surgery will be performed.  Thoroughly rinse your body with  warm water from the neck down.  DO NOT shower/wash with your normal soap after using and rinsing off the CHG Soap.  Pat yourself dry with a CLEAN TOWEL.  Wear CLEAN PAJAMAS to bed the night before surgery  Place CLEAN SHEETS on your bed the night before your surgery  DO NOT SLEEP WITH PETS.   Day of Surgery: Take a shower with CHG soap. Do not wear jewelry or makeup Do not wear lotions, powders, perfumes/colognes, or deodorant. Do not shave 48 hours prior to surgery.  Men may shave face and neck. Do not bring valuables to the hospital.  [facility] is not responsible for any belongings or valuables. Do not wear nail polish, gel polish, artificial nails, or any other type of covering on natural nails (fingers and toes) If you have artificial nails or gel coating that need to be removed by a nail salon, please have this removed prior to surgery. Artificial nails or gel coating may interfere with anesthesia's ability to adequately monitor your vital signs. Wear Clean/Comfortable clothing the morning of surgery Remember to brush your teeth WITH YOUR REGULAR TOOTHPASTE.   Please read over the following fact sheets that you were given.    If you received a COVID test during your pre-op visit  it is requested that you wear a mask when out in public, stay away from anyone that may not be feeling well and notify your surgeon if you develop symptoms. If you have been in contact with anyone that has tested positive in the last 10 days please notify you surgeon.

## 2022-10-09 ENCOUNTER — Other Ambulatory Visit: Payer: Self-pay | Admitting: Neurosurgery

## 2022-10-10 ENCOUNTER — Ambulatory Visit (HOSPITAL_COMMUNITY): Payer: Medicare Other | Admitting: Anesthesiology

## 2022-10-10 ENCOUNTER — Ambulatory Visit (HOSPITAL_COMMUNITY)
Admission: RE | Admit: 2022-10-10 | Discharge: 2022-10-10 | Disposition: A | Discharge status: Home / Self Care | Payer: Medicare Other | Attending: Neurosurgery | Admitting: Neurosurgery

## 2022-10-10 ENCOUNTER — Encounter (HOSPITAL_COMMUNITY): Payer: Self-pay | Admitting: Neurosurgery

## 2022-10-10 ENCOUNTER — Other Ambulatory Visit: Payer: Self-pay

## 2022-10-10 ENCOUNTER — Encounter (HOSPITAL_COMMUNITY)
Admission: RE | Disposition: A | Discharge status: Home / Self Care | Payer: Self-pay | Source: Home / Self Care | Attending: Neurosurgery

## 2022-10-10 ENCOUNTER — Ambulatory Visit (HOSPITAL_BASED_OUTPATIENT_CLINIC_OR_DEPARTMENT_OTHER): Payer: Medicare Other | Admitting: Anesthesiology

## 2022-10-10 ENCOUNTER — Ambulatory Visit (HOSPITAL_COMMUNITY): Payer: Medicare Other

## 2022-10-10 DIAGNOSIS — M8458XG Pathological fracture in neoplastic disease, other specified site, subsequent encounter for fracture with delayed healing: Secondary | ICD-10-CM | POA: Insufficient documentation

## 2022-10-10 DIAGNOSIS — J45909 Unspecified asthma, uncomplicated: Secondary | ICD-10-CM | POA: Insufficient documentation

## 2022-10-10 DIAGNOSIS — R7303 Prediabetes: Secondary | ICD-10-CM | POA: Diagnosis not present

## 2022-10-10 DIAGNOSIS — K219 Gastro-esophageal reflux disease without esophagitis: Secondary | ICD-10-CM | POA: Insufficient documentation

## 2022-10-10 DIAGNOSIS — I10 Essential (primary) hypertension: Secondary | ICD-10-CM | POA: Diagnosis not present

## 2022-10-10 DIAGNOSIS — Z8579 Personal history of other malignant neoplasms of lymphoid, hematopoietic and related tissues: Secondary | ICD-10-CM | POA: Diagnosis not present

## 2022-10-10 DIAGNOSIS — M40209 Unspecified kyphosis, site unspecified: Secondary | ICD-10-CM | POA: Diagnosis not present

## 2022-10-10 DIAGNOSIS — M8008XA Age-related osteoporosis with current pathological fracture, vertebra(e), initial encounter for fracture: Secondary | ICD-10-CM | POA: Diagnosis not present

## 2022-10-10 DIAGNOSIS — M4854XA Collapsed vertebra, not elsewhere classified, thoracic region, initial encounter for fracture: Secondary | ICD-10-CM

## 2022-10-10 HISTORY — PX: KYPHOPLASTY: SHX5884

## 2022-10-10 SURGERY — KYPHOPLASTY
Anesthesia: General

## 2022-10-10 MED ORDER — FENTANYL CITRATE (PF) 100 MCG/2ML IJ SOLN: 25.0000 ug | INTRAMUSCULAR | Status: DC | PRN

## 2022-10-10 MED ORDER — OXYCODONE HCL 5 MG PO TABS: 5.0000 mg | ORAL_TABLET | Freq: Once | ORAL | Status: DC | PRN

## 2022-10-10 MED ORDER — BUPIVACAINE-EPINEPHRINE 0.5% -1:200000 IJ SOLN
INTRAMUSCULAR | Status: DC | PRN
  Administered 2022-10-10: 7 mL
  Administered 2022-10-10: 4 mL

## 2022-10-10 MED ORDER — PHENYLEPHRINE HCL-NACL 20-0.9 MG/250ML-% IV SOLN
INTRAVENOUS | Status: DC | PRN
  Administered 2022-10-10: 30 ug/min via INTRAVENOUS

## 2022-10-10 MED ORDER — CHLORHEXIDINE GLUCONATE 0.12 % MT SOLN
15.0000 mL | Freq: Once | OROMUCOSAL | Status: AC
  Administered 2022-10-10: 15 mL via OROMUCOSAL
  Filled 2022-10-10: qty 15

## 2022-10-10 MED ORDER — PHENYLEPHRINE 80 MCG/ML (10ML) SYRINGE FOR IV PUSH (FOR BLOOD PRESSURE SUPPORT)
PREFILLED_SYRINGE | INTRAVENOUS | Status: AC
  Filled 2022-10-10: qty 10

## 2022-10-10 MED ORDER — DEXAMETHASONE SODIUM PHOSPHATE 10 MG/ML IJ SOLN
INTRAMUSCULAR | Status: AC
  Filled 2022-10-10: qty 1

## 2022-10-10 MED ORDER — CEFAZOLIN SODIUM-DEXTROSE 2-4 GM/100ML-% IV SOLN
2.0000 g | INTRAVENOUS | Status: AC
  Administered 2022-10-10: 2 g via INTRAVENOUS
  Filled 2022-10-10: qty 100

## 2022-10-10 MED ORDER — OXYCODONE HCL 5 MG/5ML PO SOLN: 5.0000 mg | Freq: Once | ORAL | Status: DC | PRN

## 2022-10-10 MED ORDER — LIDOCAINE 2% (20 MG/ML) 5 ML SYRINGE
INTRAMUSCULAR | Status: AC
  Filled 2022-10-10: qty 5

## 2022-10-10 MED ORDER — ONDANSETRON HCL 4 MG/2ML IJ SOLN: 4.0000 mg | Freq: Once | INTRAMUSCULAR | Status: DC | PRN

## 2022-10-10 MED ORDER — SUGAMMADEX SODIUM 200 MG/2ML IV SOLN
INTRAVENOUS | Status: DC | PRN
  Administered 2022-10-10 (×2): 60 mg via INTRAVENOUS

## 2022-10-10 MED ORDER — DEXAMETHASONE SODIUM PHOSPHATE 10 MG/ML IJ SOLN
INTRAMUSCULAR | Status: DC | PRN
  Administered 2022-10-10: 4 mg via INTRAVENOUS

## 2022-10-10 MED ORDER — ACETAMINOPHEN 500 MG PO TABS: 1000.0000 mg | ORAL_TABLET | Freq: Once | ORAL | Status: DC

## 2022-10-10 MED ORDER — LIDOCAINE 2% (20 MG/ML) 5 ML SYRINGE
INTRAMUSCULAR | Status: DC | PRN
  Administered 2022-10-10: 60 mg via INTRAVENOUS

## 2022-10-10 MED ORDER — PROPOFOL 10 MG/ML IV BOLUS
INTRAVENOUS | Status: AC
  Filled 2022-10-10: qty 20

## 2022-10-10 MED ORDER — PHENYLEPHRINE HCL (PRESSORS) 10 MG/ML IV SOLN
INTRAVENOUS | Status: DC | PRN
  Administered 2022-10-10: 80 ug via INTRAVENOUS
  Administered 2022-10-10 (×2): 160 ug via INTRAVENOUS
  Administered 2022-10-10 (×2): 80 ug via INTRAVENOUS

## 2022-10-10 MED ORDER — LACTATED RINGERS IV SOLN: INTRAVENOUS | Status: DC

## 2022-10-10 MED ORDER — HYDROCODONE-ACETAMINOPHEN 5-325 MG PO TABS: 1.0000 | ORAL_TABLET | ORAL | 0 refills | Status: DC | PRN

## 2022-10-10 MED ORDER — ORAL CARE MOUTH RINSE: 15.0000 mL | Freq: Once | OROMUCOSAL | Status: AC

## 2022-10-10 MED ORDER — ROCURONIUM BROMIDE 10 MG/ML (PF) SYRINGE
PREFILLED_SYRINGE | INTRAVENOUS | Status: AC
  Filled 2022-10-10: qty 10

## 2022-10-10 MED ORDER — IOPAMIDOL (ISOVUE-300) INJECTION 61%
INTRAVENOUS | Status: DC | PRN
  Administered 2022-10-10: 100 mL

## 2022-10-10 MED ORDER — ACETAMINOPHEN 500 MG PO TABS
1000.0000 mg | ORAL_TABLET | Freq: Once | ORAL | Status: AC
  Administered 2022-10-10: 1000 mg via ORAL
  Filled 2022-10-10: qty 2

## 2022-10-10 MED ORDER — ROCURONIUM BROMIDE 10 MG/ML (PF) SYRINGE
PREFILLED_SYRINGE | INTRAVENOUS | Status: DC | PRN
  Administered 2022-10-10: 40 mg via INTRAVENOUS

## 2022-10-10 MED ORDER — EPHEDRINE 5 MG/ML INJ
INTRAVENOUS | Status: AC
  Filled 2022-10-10: qty 5

## 2022-10-10 MED ORDER — PROPOFOL 10 MG/ML IV BOLUS
INTRAVENOUS | Status: DC | PRN
  Administered 2022-10-10: 30 mg via INTRAVENOUS
  Administered 2022-10-10: 90 mg via INTRAVENOUS
  Administered 2022-10-10: 20 mg via INTRAVENOUS

## 2022-10-10 MED ORDER — CHLORHEXIDINE GLUCONATE CLOTH 2 % EX PADS: 6.0000 | MEDICATED_PAD | Freq: Once | CUTANEOUS | Status: DC

## 2022-10-10 MED ORDER — ONDANSETRON HCL 4 MG/2ML IJ SOLN
INTRAMUSCULAR | Status: AC
  Filled 2022-10-10: qty 2

## 2022-10-10 MED ORDER — ONDANSETRON HCL 4 MG/2ML IJ SOLN
INTRAMUSCULAR | Status: DC | PRN
  Administered 2022-10-10: 4 mg via INTRAVENOUS

## 2022-10-10 MED ORDER — BUPIVACAINE-EPINEPHRINE (PF) 0.25% -1:200000 IJ SOLN
INTRAMUSCULAR | Status: AC
  Filled 2022-10-10: qty 30

## 2022-10-10 MED ORDER — 0.9 % SODIUM CHLORIDE (POUR BTL) OPTIME
TOPICAL | Status: DC | PRN
  Administered 2022-10-10: 1000 mL

## 2022-10-10 MED ORDER — FENTANYL CITRATE (PF) 250 MCG/5ML IJ SOLN
INTRAMUSCULAR | Status: DC | PRN
  Administered 2022-10-10: 50 ug via INTRAVENOUS

## 2022-10-10 MED ORDER — FENTANYL CITRATE (PF) 250 MCG/5ML IJ SOLN
INTRAMUSCULAR | Status: AC
  Filled 2022-10-10: qty 5

## 2022-10-10 MED ORDER — AMISULPRIDE (ANTIEMETIC) 5 MG/2ML IV SOLN: 10.0000 mg | Freq: Once | INTRAVENOUS | Status: DC | PRN

## 2022-10-10 SURGICAL SUPPLY — 42 items
APL SKNCLS STERI-STRIP NONHPOA (GAUZE/BANDAGES/DRESSINGS) ×1
BAG COUNTER SPONGE SURGICOUNT (BAG) ×1 IMPLANT
BAG SPNG CNTER NS LX DISP (BAG) ×1
BENZOIN TINCTURE PRP APPL 2/3 (GAUZE/BANDAGES/DRESSINGS) ×1 IMPLANT
BLADE CLIPPER SURG (BLADE) IMPLANT
BLADE SURG 15 STRL LF DISP TIS (BLADE) ×1 IMPLANT
BLADE SURG 15 STRL SS (BLADE) ×1
CEMENT KYPHON C01A KIT/MIXER (Cement) IMPLANT
DEVICE BIOPSY BONE KYPHX (INSTRUMENTS) IMPLANT
DRAPE C-ARM 42X72 X-RAY (DRAPES) ×1 IMPLANT
DRAPE HALF SHEET 40X57 (DRAPES) ×1 IMPLANT
DRAPE INCISE IOBAN 66X45 STRL (DRAPES) ×1 IMPLANT
DRAPE LAPAROTOMY 100X72X124 (DRAPES) ×1 IMPLANT
DRAPE SURG 17X23 STRL (DRAPES) ×4 IMPLANT
DRAPE WARM FLUID 44X44 (DRAPES) ×1 IMPLANT
DRSG OPSITE POSTOP 3X4 (GAUZE/BANDAGES/DRESSINGS) IMPLANT
DRSG TELFA 3X8 NADH STRL (GAUZE/BANDAGES/DRESSINGS) IMPLANT
GAUZE 4X4 16PLY ~~LOC~~+RFID DBL (SPONGE) ×1 IMPLANT
GAUZE SPONGE 4X4 12PLY STRL (GAUZE/BANDAGES/DRESSINGS) ×1 IMPLANT
GLOVE BIO SURGEON STRL SZ8 (GLOVE) ×1 IMPLANT
GLOVE BIO SURGEON STRL SZ8.5 (GLOVE) ×1 IMPLANT
GLOVE EXAM NITRILE XL STR (GLOVE) IMPLANT
GOWN STRL REUS W/ TWL LRG LVL3 (GOWN DISPOSABLE) IMPLANT
GOWN STRL REUS W/ TWL XL LVL3 (GOWN DISPOSABLE) IMPLANT
GOWN STRL REUS W/TWL LRG LVL3 (GOWN DISPOSABLE)
GOWN STRL REUS W/TWL XL LVL3 (GOWN DISPOSABLE) ×2
INTRODUCER DEVICE OSTEO LEVEL (INTRODUCER) IMPLANT
KIT BASIN OR (CUSTOM PROCEDURE TRAY) ×1 IMPLANT
KIT TURNOVER KIT B (KITS) ×1 IMPLANT
NEEDLE HYPO 22GX1.5 SAFETY (NEEDLE) ×1 IMPLANT
NS IRRIG 1000ML POUR BTL (IV SOLUTION) ×1 IMPLANT
PACK EENT II TURBAN DRAPE (CUSTOM PROCEDURE TRAY) ×1 IMPLANT
PAD ARMBOARD 7.5X6 YLW CONV (MISCELLANEOUS) ×3 IMPLANT
SPECIMEN JAR SMALL (MISCELLANEOUS) IMPLANT
STAPLER VISISTAT 35W (STAPLE) IMPLANT
STRIP CLOSURE SKIN 1/2X4 (GAUZE/BANDAGES/DRESSINGS) ×1 IMPLANT
SUT VIC AB 3-0 SH 8-18 (SUTURE) ×1 IMPLANT
SYR CONTROL 10ML LL (SYRINGE) ×1 IMPLANT
TAMP BONE INFLATABLE 10/3 (BALLOONS) IMPLANT
TOWEL GREEN STERILE (TOWEL DISPOSABLE) ×1 IMPLANT
TOWEL GREEN STERILE FF (TOWEL DISPOSABLE) ×1 IMPLANT
TRAY KYPHOPAK 15/3 ONESTEP 1ST (MISCELLANEOUS) IMPLANT

## 2022-10-10 NOTE — H&P (Signed)
Subjective: The patient is an 82 year old white female who complained of thoracic spine pain.  A CAT scan was obtained which demonstrated thoracic compression fractures.  This was confirmed with thoracic MRI.  I discussed the various treatment options with her.  She has decided proceed with thoracic kyphoplasties..  Past Medical History:  Diagnosis Date   Anxiety    Arthritis    Asthma    Hx: of   Cancer ([place])    Hx: of skin cancer   Diverticulosis    GERD (gastroesophageal reflux disease)    Hx: of   History of kidney stones    Hyperlipemia    Hypertension    Pre-diabetes     Past Surgical History:  Procedure Laterality Date   ABDOMINAL SURGERY  09/19/2012   Ruptured Diverticuli, Ostomy Placed.   BREAST BIOPSY  06/06/2012   breast biopsy   CATARACT EXTRACTION W/ INTRAOCULAR LENS IMPLANT Bilateral    COLONOSCOPY  03/06/2012   COLOSTOMY CLOSURE  12/25/2012   Dr Brantley Stage   COLOSTOMY CLOSURE N/A 12/25/2012   Procedure: COLOSTOMY CLOSURE;  Surgeon: Joyice Faster. Cornett, MD;  Location: [facility];  Service: General;  Laterality: N/A;   EXTRACORPOREAL SHOCK WAVE LITHOTRIPSY Left 11/17/2018   Procedure: EXTRACORPOREAL SHOCK WAVE LITHOTRIPSY (ESWL);  Surgeon: Cleon Gustin, MD;  Location: [facility];  Service: Urology;  Laterality: Left;   EYE SURGERY  10/19/1998   Hx: of cyst removed from left eye   LAPAROTOMY N/A 09/19/2012   Procedure: EXPLORATORY LAPAROTOMY, sigmoid colectomy;  Surgeon: Joyice Faster. Cornett, MD;  Location: [facility];  Service: General;  Laterality: N/A;   SIGMOIDOSCOPY  01/11/1999   TONSILLECTOMY     TRIGGER FINGER RELEASE  12/25/2010   TUBAL LIGATION  10/16/1985    Allergies  Allergen Reactions   Vioxx [Rofecoxib] Shortness Of Breath   Codeine Nausea And Vomiting    NOTHING WITH CODEINE   Doxycycline Hyclate     kidney stone   Fosamax [Alendronate Sodium] Nausea And Vomiting   Hydrocodone Nausea And Vomiting   Latex Rash    Social History   Tobacco Use    Smoking status: Never   Smokeless tobacco: Never  Substance Use Topics   Alcohol use: No    Family History  Problem Relation Age of Onset   Diabetes Father    Alzheimer's disease Father    Basal cell carcinoma Mother        Metastatic   Prior to Admission medications   Medication Sig Start Date End Date Taking? Authorizing Provider  acetaminophen (TYLENOL) 500 MG tablet Take 1,000 mg by mouth every 6 (six) hours as needed for moderate pain.   Yes [provider]  ALPRAZolam (XANAX) 0.25 MG tablet Take 0.25 mg by mouth See admin instructions. Take 0.25 mg at bedtime, may take an additional 0.125 mg during the day as needed for anxiety   Yes [provider]  amLODipine (NORVASC) 5 MG tablet Take 5 mg by mouth daily.   Yes [provider]  Ascorbic Acid (VITAMIN C) 1000 MG tablet Take 1,000 mg by mouth daily.   Yes [provider]  calcium carbonate (TUMS - DOSED IN MG ELEMENTAL CALCIUM) 500 MG chewable tablet Chew 1 tablet by mouth 2 (two) times daily as needed for indigestion.   Yes [provider]  cetirizine (ZYRTEC) 10 MG tablet Take 10 mg by mouth at bedtime.   Yes [provider]  dextromethorphan-guaiFENesin (TUSSIN DM) 10-100 MG/5ML liquid Take 10 mLs by  mouth every 4 (four) hours as needed for cough.   Yes [provider]  docusate sodium (COLACE) 50 MG capsule Take 50 mg by mouth daily.   Yes [provider]  fluticasone (FLONASE) 50 MCG/ACT nasal spray Place 2 sprays into the nose at bedtime.   Yes [provider]  hydrOXYzine (ATARAX) 25 MG tablet Take 25 mg by mouth at bedtime.   Yes [provider]  metoCLOPramide (REGLAN) 10 MG tablet Take 1 tablet (10 mg total) by mouth every 6 (six) hours. 10/03/22  Yes Carmin Muskrat, MD  aspirin 81 MG tablet Take 81 mg by mouth daily.    [provider]  Cholecalciferol (VITAMIN D3) 2000 UNITS capsule Take 2,000 Units by mouth daily.     [provider]  diazepam (VALIUM) 5 MG tablet Take 1 tablet (5 mg total) by mouth every 12 (twelve) hours as needed for muscle spasms. Patient not taking: Reported on 10/08/2022 09/16/22   Malvin Johns, MD  glimepiride (AMARYL) 2 MG tablet Take 1-2 mg by mouth daily as needed (high blood sugar).    [provider]  HYDROcodone-acetaminophen (NORCO/VICODIN) 5-325 MG tablet Take 1 tablet by mouth every 4 (four) hours as needed. Patient not taking: Reported on 10/08/2022 10/20/18   Suella Broad A, PA-C  KRILL OIL PO Take 1 tablet by mouth daily.    [provider]  Multiple Vitamin (MULTIVITAMIN WITH MINERALS) TABS Take 1 tablet by mouth daily.    [provider]  ondansetron (ZOFRAN ODT) 4 MG disintegrating tablet Take 1 tablet (4 mg total) by mouth every 8 (eight) hours as needed for nausea or vomiting. Patient not taking: Reported on 10/08/2022 10/03/22   Carmin Muskrat, MD  simvastatin (ZOCOR) 20 MG tablet Take 20 mg by mouth every evening.    [provider]     Review of Systems  Positive ROS: As above  All other systems have been reviewed and were otherwise negative with the exception of those mentioned in the HPI and as above.  Objective: Vital signs in last 24 hours: Temp:  [98 F (36.7 C)] 98 F (36.7 C) (03/06 1202) Pulse Rate:  [74] 74 (03/06 1202) Resp:  [18] 18 (03/06 1202) BP: (144)/(61) 144/61 (03/06 1202) SpO2:  [98 %] 98 % (03/06 1202) Weight:  [59.9 kg] 59.9 kg (03/06 1213) Estimated body mass index is 24.94 kg/m as calculated from the following:   Height as of this encounter: '5\' 1"'$  (1.549 m).   Weight as of this encounter: 59.9 kg.   General Appearance: Alert Head: Normocephalic, without obvious abnormality, atraumatic Eyes: PERRL, conjunctiva/corneas clear, EOM's intact,    Ears: Normal  Throat: Normal  Neck: Supple, Back: unremarkable Lungs: Clear to auscultation bilaterally, respirations unlabored Heart: Regular  rate and rhythm, no murmur, rub or gallop Abdomen: Soft, non-tender Extremities: Extremities normal, atraumatic, no cyanosis or edema Skin: unremarkable  NEUROLOGIC:   Mental status: alert and oriented,Motor Exam - grossly normal Sensory Exam - grossly normal Reflexes:  Coordination - grossly normal Gait - grossly normal Balance - grossly normal Cranial Nerves: I: smell Not tested  II: visual acuity  OS: Normal  OD: Normal   II: visual fields Full to confrontation  II: pupils Equal, round, reactive to light  III,VII: ptosis None  III,IV,VI: extraocular muscles  Full ROM  V: mastication Normal  V: facial light touch sensation  Normal  V,VII: corneal reflex  Present  VII: facial muscle function - upper  Normal  VII: facial muscle function - lower Normal  VIII: hearing Not tested  IX: soft palate elevation  Normal  IX,X: gag reflex Present  XI: trapezius strength  5/5  XI: sternocleidomastoid strength 5/5  XI: neck flexion strength  5/5  XII: tongue strength  Normal    Data Review Lab Results  Component Value Date   WBC 4.2 10/03/2022   HGB 11.6 (L) 10/03/2022   HCT 34.0 (L) 10/03/2022   MCV 100.0 10/03/2022   PLT 201 10/03/2022   Lab Results  Component Value Date   NA 141 10/03/2022   K 3.7 10/03/2022   CL 103 10/03/2022   CO2 26 10/03/2022   BUN 7 (L) 10/03/2022   CREATININE 0.60 10/03/2022   GLUCOSE 167 (H) 10/03/2022   No results found for: "INR", "PROTIME"  Assessment/Plan: T7 T10 and T11 compression fractures, thoracic spine pain: I have discussed situation with the patient.  We discussed the various treatment options including kyphoplasty's.  I described the procedure, the risk, benefits, alternatives, expected postop course, and likelihood of achieving our goals with surgery.  She has decided proceed with surgery.   Ophelia Charter 10/10/2022 12:45 PM

## 2022-10-10 NOTE — Anesthesia Preprocedure Evaluation (Addendum)
Anesthesia Evaluation  Patient identified by MRN, date of birth, ID band Patient awake    Reviewed: Allergy & Precautions, NPO status , Patient's Chart, lab work & pertinent test results  History of Anesthesia Complications Negative for: history of anesthetic complications  Airway Mallampati: II  TM Distance: >3 FB Neck ROM: Full    Dental no notable dental hx. (+) Dental Advisory Given, Teeth Intact   Pulmonary asthma    Pulmonary exam normal breath sounds clear to auscultation       Cardiovascular hypertension, Pt. on medications Normal cardiovascular exam Rhythm:Regular Rate:Normal     Neuro/Psych  PSYCHIATRIC DISORDERS Anxiety     negative neurological ROS     GI/Hepatic Neg liver ROS,GERD  ,,  Endo/Other  diabetes, Type 2, Oral Hypoglycemic Agents   Pre-DM   Renal/GU negative Renal ROS     Musculoskeletal  (+) Arthritis ,   Vertebral fractures    Abdominal   Peds  Hematology  (+) Blood dyscrasia, anemia   Anesthesia Other Findings   Reproductive/Obstetrics  s/p tubal ligation                              Anesthesia Physical Anesthesia Plan  ASA: 2  Anesthesia Plan: General   Post-op Pain Management: Tylenol PO (pre-op)*   Induction: Intravenous  PONV Risk Score and Plan: 3 and Ondansetron, Dexamethasone and Treatment may vary due to age or medical condition  Airway Management Planned: Oral ETT  Additional Equipment: None  Intra-op Plan:   Post-operative Plan: Extubation in OR  Informed Consent: I have reviewed the patients History and Physical, chart, labs and discussed the procedure including the risks, benefits and alternatives for the proposed anesthesia with the patient or authorized representative who has indicated his/her understanding and acceptance.     Dental advisory given  Plan Discussed with: CRNA  Anesthesia Plan Comments:         Anesthesia Quick Evaluation

## 2022-10-10 NOTE — Anesthesia Postprocedure Evaluation (Signed)
Anesthesia Post Note  Patient: Alice Anthony  Procedure(s) Performed: KYPHOPLASTY Thoracic seven , Thoracic ten,Thoracic eleven     Patient location during evaluation: PACU Anesthesia Type: General Level of consciousness: sedated and patient cooperative Pain management: pain level controlled Vital Signs Assessment: post-procedure vital signs reviewed and stable Respiratory status: spontaneous breathing Cardiovascular status: stable Anesthetic complications: no   No notable events documented.  Last Vitals:  Vitals:   10/10/22 1545 10/10/22 1600  BP: (!) 111/49 (!) 120/54  Pulse: 89 91  Resp: 14 12  Temp:  36.7 C  SpO2: 96% 95%    Last Pain:  Vitals:   10/10/22 1509  TempSrc:   PainSc: [place]

## 2022-10-10 NOTE — OR Nursing (Signed)
[facility] HV BONE CEMENT LOT [id number]  EXPIRES 01/03/2025 USED DURING PROCEDURE

## 2022-10-10 NOTE — Op Note (Signed)
Brief history: The patient is an 81 year old white female who has complained of thoracic spine pain.  She been worked up with a CT and MRI which demonstrated multiple fractures.  I discussed the various treatment options.  She has decided proceed with kyphoplasty.  Pre-Op diagnosis: T7, T10 and T11 compression fracture, thoracic spine pain  Postop diagnosis: The same  Procedure: T7, T10 and T11 kyphoplasty, right T10 vertebral body biopsy  Surgeon: Dr. Earle Gell  Assistant: None  Anesthesia: General tracheal  Estimated blood loss: 75 cc  Specimens: Vertebral body biopsy  Drains: None  Complications: None  Description of procedure: The patient was brought to the the operating room by the anesthesia team.  General endotracheal anesthesia was induced.  She was placed in the prone position on the chest rolls.  The patient's thoracolumbar region was then prepared with Betadine scrub and Betadine solution.  Sterile drapes were applied.  I injected the area to be incised with Marcaine with epinephrine solution.  I then made incisions of the bilateral T7, T10 and T11 pedicles.  I then used biplanar fluoroscopy to cannulate the bilateral T7, T10 and T11 pedicles with a Jamshidi needle.  I obtained a left T10 vertebral body biopsy.  I then drilled inside the pedicles.  I inserted the balloons, inflated balloons, deflated balloons, removed the balloons, I then injected cement into the bilateral T7, T10 and T11 vertebral bodies under biplanar fluoroscopy.  We then removed the cannulas.  I then reapproximated the patient's subcutaneous tissue with interrupted 3-0 Vicryl suture.  I reapproximated the skin with Steri-Strips and benzoin.  The wounds were then coated with bacitracin ointment.  Sterile dressing was applied.  The drapes were removed.  By report all sponge, instrument, and needle counts were correct at the end of this case.

## 2022-10-10 NOTE — Transfer of Care (Signed)
Immediate Anesthesia Transfer of Care Note  Patient: Alice Anthony  Procedure(s) Performed: KYPHOPLASTY Thoracic seven , Thoracic ten,Thoracic eleven  Patient Location: PACU  Anesthesia Type:General  Level of Consciousness: drowsy  Airway & Oxygen Therapy: Patient Spontanous Breathing  Post-op Assessment: Report given to RN  Post vital signs: Reviewed  Last Vitals:  Vitals Value Taken Time  BP 143/88 10/10/22 1515  Temp    Pulse 95 10/10/22 1518  Resp 16 10/10/22 1518  SpO2 95 % 10/10/22 1518  Vitals shown include unvalidated device data.  Last Pain:  Vitals:   10/10/22 1202  TempSrc: Oral  PainSc: 10-Worst pain ever      Patients Stated Pain Goal: 2 ([phone] [id number])  Complications: No notable events documented.

## 2022-10-10 NOTE — Anesthesia Procedure Notes (Addendum)
Procedure Name: Intubation Date/Time: 10/10/2022 1:20 PM  Performed by: Reggie Pile, CRNAPre-anesthesia Checklist: Patient identified, Emergency Drugs available, Suction available and Patient being monitored Patient Re-evaluated:Patient Re-evaluated prior to induction Oxygen Delivery Method: Circle system utilized Preoxygenation: Pre-oxygenation with 100% oxygen Induction Type: IV induction Ventilation: Mask ventilation without difficulty Laryngoscope Size: Miller, 2, Mac, 4 and Glidescope (lopro3) Tube type: Oral Tube size: 7.0 mm Number of attempts: 3 Airway Equipment and Method: Rigid stylet (soft bite block) Placement Confirmation: ETT inserted through vocal cords under direct vision, positive ETCO2 and breath sounds checked- equal and bilateral Secured at: 21 cm Tube secured with: Tape Dental Injury: Teeth and Oropharynx as per pre-operative assessment  Comments: Previous back surgery attempt x 2 with glidescope. Attempted with Miller 2 and MAC 4-short TMD noted. Glidescope x 1 with some difficulty getting tube into view, small mouth

## 2022-10-11 ENCOUNTER — Encounter (HOSPITAL_COMMUNITY): Payer: Self-pay | Admitting: Neurosurgery

## 2022-10-11 ENCOUNTER — Other Ambulatory Visit: Payer: Self-pay

## 2022-10-17 LAB — SURGICAL PATHOLOGY

## 2022-10-23 ENCOUNTER — Telehealth: Payer: Self-pay | Admitting: Hematology and Oncology

## 2022-10-23 DIAGNOSIS — Z7982 Long term (current) use of aspirin: Secondary | ICD-10-CM | POA: Diagnosis not present

## 2022-10-23 DIAGNOSIS — K219 Gastro-esophageal reflux disease without esophagitis: Secondary | ICD-10-CM | POA: Diagnosis not present

## 2022-10-23 DIAGNOSIS — Z79899 Other long term (current) drug therapy: Secondary | ICD-10-CM | POA: Diagnosis not present

## 2022-10-23 DIAGNOSIS — K5790 Diverticulosis of intestine, part unspecified, without perforation or abscess without bleeding: Secondary | ICD-10-CM | POA: Diagnosis not present

## 2022-10-23 DIAGNOSIS — Z87442 Personal history of urinary calculi: Secondary | ICD-10-CM | POA: Diagnosis not present

## 2022-10-23 DIAGNOSIS — Z85828 Personal history of other malignant neoplasm of skin: Secondary | ICD-10-CM | POA: Diagnosis not present

## 2022-10-23 DIAGNOSIS — C9030 Solitary plasmacytoma not having achieved remission: Secondary | ICD-10-CM | POA: Diagnosis not present

## 2022-10-23 DIAGNOSIS — F419 Anxiety disorder, unspecified: Secondary | ICD-10-CM | POA: Diagnosis not present

## 2022-10-23 DIAGNOSIS — Z79891 Long term (current) use of opiate analgesic: Secondary | ICD-10-CM | POA: Diagnosis not present

## 2022-10-23 DIAGNOSIS — R7303 Prediabetes: Secondary | ICD-10-CM | POA: Diagnosis not present

## 2022-10-23 DIAGNOSIS — I10 Essential (primary) hypertension: Secondary | ICD-10-CM | POA: Diagnosis not present

## 2022-10-23 DIAGNOSIS — M8458XD Pathological fracture in neoplastic disease, other specified site, subsequent encounter for fracture with routine healing: Secondary | ICD-10-CM | POA: Diagnosis not present

## 2022-10-23 DIAGNOSIS — M1990 Unspecified osteoarthritis, unspecified site: Secondary | ICD-10-CM | POA: Diagnosis not present

## 2022-10-23 DIAGNOSIS — J45909 Unspecified asthma, uncomplicated: Secondary | ICD-10-CM | POA: Diagnosis not present

## 2022-10-23 DIAGNOSIS — E785 Hyperlipidemia, unspecified: Secondary | ICD-10-CM | POA: Diagnosis not present

## 2022-10-23 NOTE — Telephone Encounter (Signed)
scheduled per 3/19 referral, pt has been called and confirmed date and time. Pt is aware of location and to arrive early for check in

## 2022-10-26 DIAGNOSIS — F330 Major depressive disorder, recurrent, mild: Secondary | ICD-10-CM | POA: Diagnosis not present

## 2022-10-29 DIAGNOSIS — Z79899 Other long term (current) drug therapy: Secondary | ICD-10-CM | POA: Diagnosis not present

## 2022-10-29 DIAGNOSIS — J45909 Unspecified asthma, uncomplicated: Secondary | ICD-10-CM | POA: Diagnosis not present

## 2022-10-29 DIAGNOSIS — M1990 Unspecified osteoarthritis, unspecified site: Secondary | ICD-10-CM | POA: Diagnosis not present

## 2022-10-29 DIAGNOSIS — F419 Anxiety disorder, unspecified: Secondary | ICD-10-CM | POA: Diagnosis not present

## 2022-10-29 DIAGNOSIS — M8458XD Pathological fracture in neoplastic disease, other specified site, subsequent encounter for fracture with routine healing: Secondary | ICD-10-CM | POA: Diagnosis not present

## 2022-10-29 DIAGNOSIS — C9030 Solitary plasmacytoma not having achieved remission: Secondary | ICD-10-CM | POA: Diagnosis not present

## 2022-11-01 DIAGNOSIS — C9030 Solitary plasmacytoma not having achieved remission: Secondary | ICD-10-CM | POA: Diagnosis not present

## 2022-11-01 DIAGNOSIS — J45909 Unspecified asthma, uncomplicated: Secondary | ICD-10-CM | POA: Diagnosis not present

## 2022-11-01 DIAGNOSIS — F419 Anxiety disorder, unspecified: Secondary | ICD-10-CM | POA: Diagnosis not present

## 2022-11-01 DIAGNOSIS — M1990 Unspecified osteoarthritis, unspecified site: Secondary | ICD-10-CM | POA: Diagnosis not present

## 2022-11-01 DIAGNOSIS — Z79899 Other long term (current) drug therapy: Secondary | ICD-10-CM | POA: Diagnosis not present

## 2022-11-01 DIAGNOSIS — M8458XD Pathological fracture in neoplastic disease, other specified site, subsequent encounter for fracture with routine healing: Secondary | ICD-10-CM | POA: Diagnosis not present

## 2022-11-05 DIAGNOSIS — M8458XD Pathological fracture in neoplastic disease, other specified site, subsequent encounter for fracture with routine healing: Secondary | ICD-10-CM | POA: Diagnosis not present

## 2022-11-05 DIAGNOSIS — J45909 Unspecified asthma, uncomplicated: Secondary | ICD-10-CM | POA: Diagnosis not present

## 2022-11-05 DIAGNOSIS — Z79899 Other long term (current) drug therapy: Secondary | ICD-10-CM | POA: Diagnosis not present

## 2022-11-05 DIAGNOSIS — M1990 Unspecified osteoarthritis, unspecified site: Secondary | ICD-10-CM | POA: Diagnosis not present

## 2022-11-05 DIAGNOSIS — F419 Anxiety disorder, unspecified: Secondary | ICD-10-CM | POA: Diagnosis not present

## 2022-11-05 DIAGNOSIS — C9030 Solitary plasmacytoma not having achieved remission: Secondary | ICD-10-CM | POA: Diagnosis not present

## 2022-11-07 DIAGNOSIS — C9030 Solitary plasmacytoma not having achieved remission: Secondary | ICD-10-CM | POA: Diagnosis not present

## 2022-11-07 DIAGNOSIS — J45909 Unspecified asthma, uncomplicated: Secondary | ICD-10-CM | POA: Diagnosis not present

## 2022-11-07 DIAGNOSIS — M8458XD Pathological fracture in neoplastic disease, other specified site, subsequent encounter for fracture with routine healing: Secondary | ICD-10-CM | POA: Diagnosis not present

## 2022-11-07 DIAGNOSIS — M1990 Unspecified osteoarthritis, unspecified site: Secondary | ICD-10-CM | POA: Diagnosis not present

## 2022-11-07 DIAGNOSIS — F419 Anxiety disorder, unspecified: Secondary | ICD-10-CM | POA: Diagnosis not present

## 2022-11-07 DIAGNOSIS — Z79899 Other long term (current) drug therapy: Secondary | ICD-10-CM | POA: Diagnosis not present

## 2022-11-12 DIAGNOSIS — J45909 Unspecified asthma, uncomplicated: Secondary | ICD-10-CM | POA: Diagnosis not present

## 2022-11-12 DIAGNOSIS — M8458XD Pathological fracture in neoplastic disease, other specified site, subsequent encounter for fracture with routine healing: Secondary | ICD-10-CM | POA: Diagnosis not present

## 2022-11-12 DIAGNOSIS — M1990 Unspecified osteoarthritis, unspecified site: Secondary | ICD-10-CM | POA: Diagnosis not present

## 2022-11-12 DIAGNOSIS — F419 Anxiety disorder, unspecified: Secondary | ICD-10-CM | POA: Diagnosis not present

## 2022-11-12 DIAGNOSIS — Z79899 Other long term (current) drug therapy: Secondary | ICD-10-CM | POA: Diagnosis not present

## 2022-11-12 DIAGNOSIS — C9030 Solitary plasmacytoma not having achieved remission: Secondary | ICD-10-CM | POA: Diagnosis not present

## 2022-11-13 DIAGNOSIS — E7800 Pure hypercholesterolemia, unspecified: Secondary | ICD-10-CM | POA: Diagnosis not present

## 2022-11-13 DIAGNOSIS — I10 Essential (primary) hypertension: Secondary | ICD-10-CM | POA: Diagnosis not present

## 2022-11-13 DIAGNOSIS — Z9049 Acquired absence of other specified parts of digestive tract: Secondary | ICD-10-CM | POA: Diagnosis not present

## 2022-11-13 DIAGNOSIS — R110 Nausea: Secondary | ICD-10-CM | POA: Diagnosis not present

## 2022-11-13 DIAGNOSIS — M353 Polymyalgia rheumatica: Secondary | ICD-10-CM | POA: Diagnosis not present

## 2022-11-13 DIAGNOSIS — C9030 Solitary plasmacytoma not having achieved remission: Secondary | ICD-10-CM | POA: Diagnosis not present

## 2022-11-13 DIAGNOSIS — E1169 Type 2 diabetes mellitus with other specified complication: Secondary | ICD-10-CM | POA: Diagnosis not present

## 2022-11-13 DIAGNOSIS — I129 Hypertensive chronic kidney disease with stage 1 through stage 4 chronic kidney disease, or unspecified chronic kidney disease: Secondary | ICD-10-CM | POA: Diagnosis not present

## 2022-11-13 DIAGNOSIS — F330 Major depressive disorder, recurrent, mild: Secondary | ICD-10-CM | POA: Diagnosis not present

## 2022-11-13 DIAGNOSIS — Z0000 Encounter for general adult medical examination without abnormal findings: Secondary | ICD-10-CM | POA: Diagnosis not present

## 2022-11-13 NOTE — Progress Notes (Unsigned)
[facility] [phone]   [phone]  INITIAL CONSULT NOTE  Patient Care Team: [name], MD as PCP - General (Family Medicine)  Hematological/Oncological History # Plasmacytoma of Thoracic Spine  10/10/2022: biopsy of compression fracture of T11 reveals a plasmacytoma 11/13/2022: establish care with Dr. Leonides Schanz   CHIEF COMPLAINTS/PURPOSE OF CONSULTATION:  "Plasmacytoma of Thoracic Spine  "  HISTORY OF PRESENTING ILLNESS:  Alice Anthony 82 y.o. female with medical history significant for ***  On review of the previous records ***  On exam today ***  MEDICAL HISTORY:  Past Medical History:  Diagnosis Date   Anxiety    Arthritis    Asthma    Hx: of   Cancer ([facility])    Hx: of skin cancer   Diverticulosis    GERD (gastroesophageal reflux disease)    Hx: of   History of kidney stones    Hyperlipemia    Hypertension    Pre-diabetes     SURGICAL HISTORY: Past Surgical History:  Procedure Laterality Date   ABDOMINAL SURGERY  09/19/2012   Ruptured Diverticuli, Ostomy Placed.   BREAST BIOPSY  06/06/2012   breast biopsy   CATARACT EXTRACTION W/ INTRAOCULAR LENS IMPLANT Bilateral    COLONOSCOPY  03/06/2012   COLOSTOMY CLOSURE  12/25/2012   Dr Luisa Hart   COLOSTOMY CLOSURE N/A 12/25/2012   Procedure: COLOSTOMY CLOSURE;  Surgeon: Clovis Pu. Cornett, MD;  Location: [facility];  Service: General;  Laterality: N/A;   EXTRACORPOREAL SHOCK WAVE LITHOTRIPSY Left 11/17/2018   Procedure: EXTRACORPOREAL SHOCK WAVE LITHOTRIPSY (ESWL);  Surgeon: Malen Gauze, MD;  Location: [facility];  Service: Urology;  Laterality: Left;   EYE SURGERY  10/19/1998   Hx: of cyst removed from left eye   KYPHOPLASTY N/A 10/10/2022   Procedure: KYPHOPLASTY Thoracic seven , Thoracic ten,Thoracic eleven;  Surgeon: Tressie Stalker, MD;  Location: [facility] OR;  Service: Neurosurgery;  Laterality: N/A;   LAPAROTOMY N/A 09/19/2012   Procedure: EXPLORATORY LAPAROTOMY, sigmoid  colectomy;  Surgeon: Clovis Pu. Cornett, MD;  Location: [facility];  Service: General;  Laterality: N/A;   SIGMOIDOSCOPY  01/11/1999   TONSILLECTOMY     TRIGGER FINGER RELEASE  12/25/2010   TUBAL LIGATION  10/16/1985    SOCIAL HISTORY: Social History   Socioeconomic History   Marital status: Widowed    Spouse name: Not on file   Number of children: Not on file   Years of education: Not on file   Highest education level: Not on file  Occupational History   Not on file  Tobacco Use   Smoking status: Never   Smokeless tobacco: Never  Vaping Use   Vaping Use: Never used  Substance and Sexual Activity   Alcohol use: No   Drug use: No   Sexual activity: Not Currently  Other Topics Concern   Not on file  Social History Narrative   Not on file   Social Determinants of Health   Financial Resource Strain: Not on file  Food Insecurity: Not on file  Transportation Needs: Not on file  Physical Activity: Not on file  Stress: Not on file  Social Connections: Not on file  Intimate Partner Violence: Not on file    FAMILY HISTORY: Family History  Problem Relation Age of Onset   Diabetes Father    Alzheimer's disease Father    Basal cell carcinoma Mother        Metastatic    ALLERGIES:  is allergic to vioxx [rofecoxib], codeine, doxycycline hyclate,  fosamax [alendronate sodium], hydrocodone, and latex.  MEDICATIONS:  Current Outpatient Medications  Medication Sig Dispense Refill   acetaminophen (TYLENOL) 500 MG tablet Take 1,000 mg by mouth every 6 (six) hours as needed for moderate pain.     amLODipine (NORVASC) 5 MG tablet Take 5 mg by mouth daily.     Ascorbic Acid (VITAMIN C) 1000 MG tablet Take 1,000 mg by mouth daily.     aspirin 81 MG tablet Take 81 mg by mouth daily.     calcium carbonate (TUMS - DOSED IN MG ELEMENTAL CALCIUM) 500 MG chewable tablet Chew 1 tablet by mouth 2 (two) times daily as needed for indigestion.     cetirizine (ZYRTEC) 10 MG tablet Take 10 mg by  mouth at bedtime.     Cholecalciferol (VITAMIN D3) 2000 UNITS capsule Take 2,000 Units by mouth daily.     dextromethorphan-guaiFENesin (TUSSIN DM) 10-100 MG/5ML liquid Take 10 mLs by mouth every 4 (four) hours as needed for cough.     docusate sodium (COLACE) 50 MG capsule Take 50 mg by mouth daily.     fluticasone (FLONASE) 50 MCG/ACT nasal spray Place 2 sprays into the nose at bedtime.     glimepiride (AMARYL) 2 MG tablet Take 1-2 mg by mouth daily as needed (high blood sugar).     HYDROcodone-acetaminophen (NORCO/VICODIN) 5-325 MG tablet Take 1-2 tablets by mouth every 4 (four) hours as needed for moderate pain. 30 tablet 0   hydrOXYzine (ATARAX) 25 MG tablet Take 25 mg by mouth at bedtime.     KRILL OIL PO Take 1 tablet by mouth daily.     metoCLOPramide (REGLAN) 10 MG tablet Take 1 tablet (10 mg total) by mouth every 6 (six) hours. 30 tablet 0   Multiple Vitamin (MULTIVITAMIN WITH MINERALS) TABS Take 1 tablet by mouth daily.     ondansetron (ZOFRAN ODT) 4 MG disintegrating tablet Take 1 tablet (4 mg total) by mouth every 8 (eight) hours as needed for nausea or vomiting. (Patient not taking: Reported on 10/08/2022) 12 tablet 0   simvastatin (ZOCOR) 20 MG tablet Take 20 mg by mouth every evening.     No current facility-administered medications for this visit.    REVIEW OF SYSTEMS:   Constitutional: ( - ) fevers, ( - )  chills , ( - ) night sweats Eyes: ( - ) blurriness of vision, ( - ) double vision, ( - ) watery eyes Ears, nose, mouth, throat, and face: ( - ) mucositis, ( - ) sore throat Respiratory: ( - ) cough, ( - ) dyspnea, ( - ) wheezes Cardiovascular: ( - ) palpitation, ( - ) chest discomfort, ( - ) lower extremity swelling Gastrointestinal:  ( - ) nausea, ( - ) heartburn, ( - ) change in bowel habits Skin: ( - ) abnormal skin rashes Lymphatics: ( - ) new lymphadenopathy, ( - ) easy bruising Neurological: ( - ) numbness, ( - ) tingling, ( - ) new weaknesses Behavioral/Psych: ( -  ) mood change, ( - ) new changes  All other systems were reviewed with the patient and are negative.  PHYSICAL EXAMINATION:  There were no vitals filed for this visit. There were no vitals filed for this visit.  GENERAL: well appearing *** in NAD  SKIN: skin color, texture, turgor are normal, no rashes or significant lesions EYES: conjunctiva are pink and non-injected, sclera clear LUNGS: clear to auscultation and percussion with normal breathing effort HEART: regular rate & rhythm and  no murmurs and no lower extremity edema Musculoskeletal: no cyanosis of digits and no clubbing  PSYCH: alert & oriented x 3, fluent speech NEURO: no focal motor/sensory deficits  LABORATORY DATA:  I have reviewed the data as listed    Latest Ref Rng & Units 10/03/2022    1:22 PM 10/03/2022    1:16 PM 09/19/2022    1:27 PM  CBC  WBC 4.0 - 10.5 K/uL  4.2  6.6   Hemoglobin 12.0 - 15.0 g/dL 16.111.6  09.611.9  04.512.5   Hematocrit 36.0 - 46.0 % 34.0  34.8  36.3   Platelets 150 - 400 K/uL  201  206        Latest Ref Rng & Units 10/03/2022    1:22 PM 10/03/2022    1:16 PM 09/19/2022    1:27 PM  CMP  Glucose 70 - 99 mg/dL 409167  811174  914190   BUN 8 - 23 mg/dL 7  7  12    Creatinine 0.44 - 1.00 mg/dL 7.820.60  9.560.77  2.130.68   Sodium 135 - 145 mmol/L 141  141  139   Potassium 3.5 - 5.1 mmol/L 3.7  3.6  3.5   Chloride 98 - 111 mmol/L 103  103  105   CO2 22 - 32 mmol/L  26  25   Calcium 8.9 - 10.3 mg/dL  9.7  8.8   Total Protein 6.5 - 8.1 g/dL  6.1  6.9   Total Bilirubin 0.3 - 1.2 mg/dL  0.9  0.8   Alkaline Phos 38 - 126 U/L  86  87   AST 15 - 41 U/L  24  20   ALT 0 - 44 U/L  16  17      ASSESSMENT & PLAN ***  After review of the labs, review of the records, and discussion with the patient the patients findings are most consistent with ***  # Plasmacytoma of Thoracic Spine  --today will order an SPEP, UPEP, SFLC and beta 2 microglobulin --additionally will collect new baseline CBC, CMP, and LDH --recommend a PET CT  scan to assess for lytic lesions --will need a bone marrow biopsy to r/o Multiple myeloma.  --if isolated plasmacytoma could treat with definitive radiation therapy.  --RTC pending results of the above studies.    No orders of the defined types were placed in this encounter.   All questions were answered. The patient knows to call the clinic with any problems, questions or concerns.  A total of more than 60 minutes were spent on this encounter with face-to-face time and non-face-to-face time, including preparing to see the patient, ordering tests and/or medications, counseling the patient and coordination of care as outlined above.   Alice BarnsJohn T. Okechukwu Regnier, MD Department of Hematology/Oncology [facility] Phone: [phone] Pager: [phone] Email: [email or web address]  11/13/2022 10:21 PM

## 2022-11-14 ENCOUNTER — Inpatient Hospital Stay (HOSPITAL_BASED_OUTPATIENT_CLINIC_OR_DEPARTMENT_OTHER): Payer: Medicare Other | Admitting: Hematology and Oncology

## 2022-11-14 ENCOUNTER — Inpatient Hospital Stay: Payer: Medicare Other | Attending: Hematology and Oncology

## 2022-11-14 VITALS — BP 130/60 | HR 92 | Temp 98.1°F | Resp 17

## 2022-11-14 DIAGNOSIS — C9030 Solitary plasmacytoma not having achieved remission: Secondary | ICD-10-CM

## 2022-11-14 DIAGNOSIS — R7303 Prediabetes: Secondary | ICD-10-CM

## 2022-11-14 DIAGNOSIS — M1990 Unspecified osteoarthritis, unspecified site: Secondary | ICD-10-CM | POA: Insufficient documentation

## 2022-11-14 DIAGNOSIS — I10 Essential (primary) hypertension: Secondary | ICD-10-CM | POA: Diagnosis not present

## 2022-11-14 DIAGNOSIS — E785 Hyperlipidemia, unspecified: Secondary | ICD-10-CM | POA: Diagnosis not present

## 2022-11-14 DIAGNOSIS — Z79899 Other long term (current) drug therapy: Secondary | ICD-10-CM | POA: Insufficient documentation

## 2022-11-14 DIAGNOSIS — Z0000 Encounter for general adult medical examination without abnormal findings: Secondary | ICD-10-CM

## 2022-11-14 DIAGNOSIS — J45909 Unspecified asthma, uncomplicated: Secondary | ICD-10-CM | POA: Diagnosis not present

## 2022-11-14 DIAGNOSIS — K219 Gastro-esophageal reflux disease without esophagitis: Secondary | ICD-10-CM

## 2022-11-14 DIAGNOSIS — Z85828 Personal history of other malignant neoplasm of skin: Secondary | ICD-10-CM | POA: Diagnosis not present

## 2022-11-14 DIAGNOSIS — E559 Vitamin D deficiency, unspecified: Secondary | ICD-10-CM

## 2022-11-14 LAB — LIPID PANEL
Cholesterol: 174 mg/dL (ref 0–200)
HDL: 43 mg/dL (ref >40)
LDL Cholesterol: 98 mg/dL (ref 0–99)
Total CHOL/HDL Ratio: 4 RATIO
Triglycerides: 165 mg/dL — ABNORMAL HIGH (ref <150)
VLDL: 33 mg/dL (ref 0–40)

## 2022-11-14 LAB — CBC WITH DIFFERENTIAL (CANCER CENTER ONLY)
Abs Immature Granulocytes: 0.01 10*3/uL (ref 0.00–0.07)
Basophils Absolute: 0.1 10*3/uL (ref 0.0–0.1)
Basophils Relative: 2 %
Eosinophils Absolute: 0.2 10*3/uL (ref 0.0–0.5)
Eosinophils Relative: 6 %
HCT: 32.8 % — ABNORMAL LOW (ref 36.0–46.0)
Hemoglobin: 11.9 g/dL — ABNORMAL LOW (ref 12.0–15.0)
Immature Granulocytes: 0 %
Lymphocytes Relative: 46 %
Lymphs Abs: 1.9 10*3/uL (ref 0.7–4.0)
MCH: 36.4 pg — ABNORMAL HIGH (ref 26.0–34.0)
MCHC: 36.3 g/dL — ABNORMAL HIGH (ref 30.0–36.0)
MCV: 100.3 fL — ABNORMAL HIGH (ref 80.0–100.0)
Monocytes Absolute: 0.5 10*3/uL (ref 0.1–1.0)
Monocytes Relative: 12 %
Neutro Abs: 1.4 10*3/uL — ABNORMAL LOW (ref 1.7–7.7)
Neutrophils Relative %: 34 %
Platelet Count: 166 10*3/uL (ref 150–400)
RBC: 3.27 MIL/uL — ABNORMAL LOW (ref 3.87–5.11)
RDW: 13.8 % (ref 11.5–15.5)
WBC Count: 4.1 10*3/uL (ref 4.0–10.5)
nRBC: 0 % (ref 0.0–0.2)

## 2022-11-14 LAB — CMP (CANCER CENTER ONLY)
ALT: 27 U/L (ref 0–44)
AST: 36 U/L (ref 15–41)
Albumin: 4.8 g/dL (ref 3.5–5.0)
Alkaline Phosphatase: 85 U/L (ref 38–126)
Anion gap: 6 (ref 5–15)
BUN: 18 mg/dL (ref 8–23)
CO2: 32 mmol/L (ref 22–32)
Calcium: 10.3 mg/dL (ref 8.9–10.3)
Chloride: 106 mmol/L (ref 98–111)
Creatinine: 0.81 mg/dL (ref 0.44–1.00)
GFR, Estimated: >60 mL/min (ref >60)
Glucose, Bld: 124 mg/dL — ABNORMAL HIGH (ref 70–99)
Potassium: 4.1 mmol/L (ref 3.5–5.1)
Sodium: 144 mmol/L (ref 135–145)
Total Bilirubin: 0.5 mg/dL (ref 0.3–1.2)
Total Protein: 7 g/dL (ref 6.5–8.1)

## 2022-11-14 LAB — HEMOGLOBIN A1C
Hgb A1c MFr Bld: 6.5 % — ABNORMAL HIGH (ref 4.8–5.6)
Mean Plasma Glucose: 139.85 mg/dL

## 2022-11-14 LAB — VITAMIN D 25 HYDROXY (VIT D DEFICIENCY, FRACTURES): Vit D, 25-Hydroxy: 59.16 ng/mL (ref 30–100)

## 2022-11-14 LAB — LACTATE DEHYDROGENASE: LDH: 225 U/L — ABNORMAL HIGH (ref 98–192)

## 2022-11-15 DIAGNOSIS — J45909 Unspecified asthma, uncomplicated: Secondary | ICD-10-CM | POA: Diagnosis not present

## 2022-11-15 DIAGNOSIS — Z79899 Other long term (current) drug therapy: Secondary | ICD-10-CM | POA: Diagnosis not present

## 2022-11-15 DIAGNOSIS — F419 Anxiety disorder, unspecified: Secondary | ICD-10-CM | POA: Diagnosis not present

## 2022-11-15 DIAGNOSIS — M8458XD Pathological fracture in neoplastic disease, other specified site, subsequent encounter for fracture with routine healing: Secondary | ICD-10-CM | POA: Diagnosis not present

## 2022-11-15 DIAGNOSIS — M1990 Unspecified osteoarthritis, unspecified site: Secondary | ICD-10-CM | POA: Diagnosis not present

## 2022-11-15 DIAGNOSIS — C9030 Solitary plasmacytoma not having achieved remission: Secondary | ICD-10-CM | POA: Diagnosis not present

## 2022-11-15 LAB — BETA 2 MICROGLOBULIN, SERUM: Beta-2 Microglobulin: 3.8 mg/L — ABNORMAL HIGH (ref 0.6–2.4)

## 2022-11-15 LAB — KAPPA/LAMBDA LIGHT CHAINS
Kappa free light chain: 4793 mg/L — ABNORMAL HIGH (ref 3.3–19.4)
Kappa, lambda light chain ratio: 1843.46 — ABNORMAL HIGH (ref 0.26–1.65)
Lambda free light chains: 2.6 mg/L — ABNORMAL LOW (ref 5.7–26.3)

## 2022-11-16 ENCOUNTER — Telehealth: Payer: Self-pay | Admitting: *Deleted

## 2022-11-16 NOTE — Telephone Encounter (Signed)
Fax'd dental clearance form pt's dentist to complete and fax back.. Received fax receipt confirmation.

## 2022-11-18 LAB — MULTIPLE MYELOMA PANEL, SERUM
Albumin SerPl Elph-Mcnc: 4.4 g/dL (ref 2.9–4.4)
Albumin/Glob SerPl: 2.1 — ABNORMAL HIGH (ref 0.7–1.7)
Alpha 1: 0.3 g/dL (ref 0.0–0.4)
Alpha2 Glob SerPl Elph-Mcnc: 0.8 g/dL (ref 0.4–1.0)
B-Globulin SerPl Elph-Mcnc: 0.8 g/dL (ref 0.7–1.3)
Gamma Glob SerPl Elph-Mcnc: 0.3 g/dL — ABNORMAL LOW (ref 0.4–1.8)
Globulin, Total: 2.2 g/dL (ref 2.2–3.9)
IgA: 7 mg/dL — ABNORMAL LOW (ref 64–422)
IgG (Immunoglobin G), Serum: 180 mg/dL — ABNORMAL LOW (ref 586–1602)
IgM (Immunoglobulin M), Srm: 8 mg/dL — ABNORMAL LOW (ref 26–217)
M Protein SerPl Elph-Mcnc: 0.1 g/dL — ABNORMAL HIGH
Total Protein ELP: 6.6 g/dL (ref 6.0–8.5)

## 2022-11-19 ENCOUNTER — Telehealth: Payer: Self-pay | Admitting: *Deleted

## 2022-11-19 NOTE — Telephone Encounter (Signed)
Received Dental Clearance for Zometa from pt's dentist, Dr. Milinda Antis, DDS Sent to be scanned to pt's chart.

## 2022-11-20 DIAGNOSIS — M4850XG Collapsed vertebra, not elsewhere classified, site unspecified, subsequent encounter for fracture with delayed healing: Secondary | ICD-10-CM | POA: Diagnosis not present

## 2022-11-21 ENCOUNTER — Other Ambulatory Visit: Payer: Self-pay

## 2022-11-21 ENCOUNTER — Emergency Department (HOSPITAL_COMMUNITY)
Admission: EM | Admit: 2022-11-21 | Discharge: 2022-11-21 | Disposition: A | Discharge status: Home / Self Care | Payer: Medicare Other | Attending: Emergency Medicine | Admitting: Emergency Medicine

## 2022-11-21 ENCOUNTER — Encounter (HOSPITAL_COMMUNITY): Payer: Self-pay

## 2022-11-21 ENCOUNTER — Emergency Department (HOSPITAL_COMMUNITY): Payer: Medicare Other

## 2022-11-21 DIAGNOSIS — I10 Essential (primary) hypertension: Secondary | ICD-10-CM | POA: Insufficient documentation

## 2022-11-21 DIAGNOSIS — Z7984 Long term (current) use of oral hypoglycemic drugs: Secondary | ICD-10-CM | POA: Insufficient documentation

## 2022-11-21 DIAGNOSIS — Z91040 Latex allergy status: Secondary | ICD-10-CM | POA: Insufficient documentation

## 2022-11-21 DIAGNOSIS — K5641 Fecal impaction: Secondary | ICD-10-CM | POA: Insufficient documentation

## 2022-11-21 DIAGNOSIS — R1031 Right lower quadrant pain: Secondary | ICD-10-CM | POA: Diagnosis present

## 2022-11-21 DIAGNOSIS — Z79899 Other long term (current) drug therapy: Secondary | ICD-10-CM | POA: Diagnosis not present

## 2022-11-21 DIAGNOSIS — Z7982 Long term (current) use of aspirin: Secondary | ICD-10-CM | POA: Insufficient documentation

## 2022-11-21 DIAGNOSIS — K5903 Drug induced constipation: Secondary | ICD-10-CM

## 2022-11-21 DIAGNOSIS — R1084 Generalized abdominal pain: Secondary | ICD-10-CM | POA: Diagnosis not present

## 2022-11-21 DIAGNOSIS — K5730 Diverticulosis of large intestine without perforation or abscess without bleeding: Secondary | ICD-10-CM | POA: Diagnosis not present

## 2022-11-21 LAB — COMPREHENSIVE METABOLIC PANEL
ALT: 19 U/L (ref 0–44)
AST: 22 U/L (ref 15–41)
Albumin: 4.5 g/dL (ref 3.5–5.0)
Alkaline Phosphatase: 74 U/L (ref 38–126)
Anion gap: 13 (ref 5–15)
BUN: 11 mg/dL (ref 8–23)
CO2: 23 mmol/L (ref 22–32)
Calcium: 9.7 mg/dL (ref 8.9–10.3)
Chloride: 106 mmol/L (ref 98–111)
Creatinine, Ser: 0.69 mg/dL (ref 0.44–1.00)
GFR, Estimated: >60 mL/min (ref >60)
Glucose, Bld: 149 mg/dL — ABNORMAL HIGH (ref 70–99)
Potassium: 3.8 mmol/L (ref 3.5–5.1)
Sodium: 142 mmol/L (ref 135–145)
Total Bilirubin: 0.9 mg/dL (ref 0.3–1.2)
Total Protein: 6.6 g/dL (ref 6.5–8.1)

## 2022-11-21 LAB — CBC WITH DIFFERENTIAL/PLATELET
Abs Immature Granulocytes: 0.02 10*3/uL (ref 0.00–0.07)
Basophils Absolute: 0 10*3/uL (ref 0.0–0.1)
Basophils Relative: 1 %
Eosinophils Absolute: 0 10*3/uL (ref 0.0–0.5)
Eosinophils Relative: 1 %
HCT: 33 % — ABNORMAL LOW (ref 36.0–46.0)
Hemoglobin: 11.2 g/dL — ABNORMAL LOW (ref 12.0–15.0)
Immature Granulocytes: 0 %
Lymphocytes Relative: 20 %
Lymphs Abs: 1.3 10*3/uL (ref 0.7–4.0)
MCH: 35.7 pg — ABNORMAL HIGH (ref 26.0–34.0)
MCHC: 33.9 g/dL (ref 30.0–36.0)
MCV: 105.1 fL — ABNORMAL HIGH (ref 80.0–100.0)
Monocytes Absolute: 0.4 10*3/uL (ref 0.1–1.0)
Monocytes Relative: 6 %
Neutro Abs: 4.7 10*3/uL (ref 1.7–7.7)
Neutrophils Relative %: 72 %
Platelets: 170 10*3/uL (ref 150–400)
RBC: 3.14 MIL/uL — ABNORMAL LOW (ref 3.87–5.11)
RDW: 14.2 % (ref 11.5–15.5)
WBC: 6.6 10*3/uL (ref 4.0–10.5)
nRBC: 0 % (ref 0.0–0.2)

## 2022-11-21 LAB — LIPASE, BLOOD: Lipase: 25 U/L (ref 11–51)

## 2022-11-21 MED ORDER — IOHEXOL 300 MG/ML  SOLN
100.0000 mL | Freq: Once | INTRAMUSCULAR | Status: AC | PRN
  Administered 2022-11-21: 100 mL via INTRAVENOUS

## 2022-11-21 MED ORDER — FLEET ENEMA 7-19 GM/118ML RE ENEM
1.0000 | ENEMA | Freq: Once | RECTAL | Status: AC
  Administered 2022-11-21: 1 via RECTAL
  Filled 2022-11-21: qty 1

## 2022-11-21 MED ORDER — SODIUM CHLORIDE (PF) 0.9 % IJ SOLN
INTRAMUSCULAR | Status: AC
  Filled 2022-11-21: qty 50

## 2022-11-21 MED ORDER — METOCLOPRAMIDE HCL 5 MG/ML IJ SOLN
10.0000 mg | Freq: Once | INTRAMUSCULAR | Status: AC
  Administered 2022-11-21: 10 mg via INTRAVENOUS
  Filled 2022-11-21: qty 2

## 2022-11-21 NOTE — ED Notes (Signed)
Patient transported to CT 

## 2022-11-21 NOTE — Discharge Instructions (Addendum)
Continue to place a warm pack and elevate your arm where the IV infiltrated.  I will be bruised and sore over the next few days.  Everything with your scan looked okay today.  Continue to take MiraLAX daily and if 1 Is not enough you can take 2 to prevent constipation like this again.  At least while you are having to take the pain medication.  Return if you start having recurrent vomiting, you are not passing gas or having bowel movements or you start having severe abdominal pain.

## 2022-11-21 NOTE — ED Provider Notes (Signed)
[facility] AT [facility] Provider Note   CSN: [id number] Arrival date & time: 11/21/22  1043     History  Chief Complaint  Patient presents with   Abdominal Pain    Alice Anthony is a 82 y.o. female.  Patient is an 82 year old female with a history of hypertension, hyperlipidemia, prior diverticulitis resulting in an ostomy and then ostomy takedown in 2014, kyphoplasty in March who is currently in a back brace chronically but reports that since her surgery in March she has been having to use tramadol so that she can function due to the pain in her back but has had worsening constipation.  She reports that for the last 5 days now she has not had a bowel movement as having abdominal pain and nausea.  Vomiting has been intermittent but she reports that she has taken Senokot, MiraLAX, Dulcolax and she cannot poop.  She feels it there but cannot push it out.  She has not had any leakage of stool.  She reports it is just getting worse and she did not know what else to do.  The history is provided by the patient and medical records.  Abdominal Pain      Home Medications Prior to Admission medications   Medication Sig Start Date End Date Taking? Authorizing Provider  acetaminophen (TYLENOL) 500 MG tablet Take 1,000 mg by mouth every 6 (six) hours as needed for moderate pain.    [provider]  amLODipine (NORVASC) 5 MG tablet Take 5 mg by mouth daily.    [provider]  Ascorbic Acid (VITAMIN C) 1000 MG tablet Take 1,000 mg by mouth daily.    [provider]  aspirin 81 MG tablet Take 81 mg by mouth daily.    [provider]  calcium carbonate (TUMS - DOSED IN MG ELEMENTAL CALCIUM) 500 MG chewable tablet Chew 1 tablet by mouth 2 (two) times daily as needed for indigestion.    [provider]  cetirizine (ZYRTEC) 10 MG tablet Take 10 mg by mouth at bedtime.    [provider]  Cholecalciferol (VITAMIN  D3) 2000 UNITS capsule Take 2,000 Units by mouth daily.    [provider]  dextromethorphan-guaiFENesin (TUSSIN DM) 10-100 MG/5ML liquid Take 10 mLs by mouth every 4 (four) hours as needed for cough.    [provider]  docusate sodium (COLACE) 50 MG capsule Take 50 mg by mouth daily.    [provider]  fluticasone (FLONASE) 50 MCG/ACT nasal spray Place 2 sprays into the nose at bedtime.    [provider]  glimepiride (AMARYL) 2 MG tablet Take 1-2 mg by mouth daily as needed (high blood sugar).    [provider]  hydrOXYzine (ATARAX) 25 MG tablet Take 25 mg by mouth at bedtime.    [provider]  KRILL OIL PO Take 1 tablet by mouth daily.    [provider]  metoCLOPramide (REGLAN) 10 MG tablet Take 1 tablet (10 mg total) by mouth every 6 (six) hours. 10/03/22   Gerhard Munch, MD  Multiple Vitamin (MULTIVITAMIN WITH MINERALS) TABS Take 1 tablet by mouth daily.    [provider]  polyethylene glycol powder (MIRALAX) 17 GM/SCOOP powder Take 1 Container by mouth daily. 10/05/22   [provider]  simvastatin (ZOCOR) 20 MG tablet Take 20 mg by mouth every evening.    [provider]  traMADol (ULTRAM) 50 MG tablet Take 50-100 mg by mouth every  6 (six) hours as needed. 10/19/22   [provider]      Allergies    Vioxx [rofecoxib], Codeine, Doxycycline hyclate, Hydrocodone, and Latex    Review of Systems   Review of Systems  Gastrointestinal:  Positive for abdominal pain.    Physical Exam Updated Vital Signs BP (!) 136/58 (BP Location: Right Arm)   Pulse 94   Temp 98 F (36.7 C) (Oral)   Resp 15   Ht  (1.549 m)   Wt 58.1 kg   SpO2 94%   BMI 24.19 kg/m  Physical Exam Vitals and nursing note reviewed.  Constitutional:      General: She is not in acute distress.    Appearance: She is well-developed.     Comments: Patient vomiting on my arrival  HENT:     Head: Normocephalic  and atraumatic.  Eyes:     Pupils: Pupils are equal, round, and reactive to light.  Cardiovascular:     Rate and Rhythm: Regular rhythm. Tachycardia present.     Heart sounds: Normal heart sounds. No murmur heard.    No friction rub.  Pulmonary:     Effort: Pulmonary effort is normal.     Breath sounds: Normal breath sounds. No wheezing or rales.  Abdominal:     General: Bowel sounds are normal. There is distension.     Palpations: Abdomen is soft.     Tenderness: There is abdominal tenderness in the right lower quadrant. There is guarding. There is no rebound.  Genitourinary:    Comments: Fecal impaction with moderate amount of stool removed manually Musculoskeletal:        General: No tenderness. Normal range of motion.     Comments: No edema  Skin:    General: Skin is warm and dry.     Findings: No rash.  Neurological:     Mental Status: She is alert and oriented to person, place, and time.     Cranial Nerves: No cranial nerve deficit.  Psychiatric:        Behavior: Behavior normal.     ED Results / Procedures / Treatments   Labs (all labs ordered are listed, but only abnormal results are displayed) Labs Reviewed  CBC WITH DIFFERENTIAL/PLATELET - Abnormal; Notable for the following components:      Result Value   RBC 3.14 (*)    Hemoglobin 11.2 (*)    HCT 33.0 (*)    MCV 105.1 (*)    MCH 35.7 (*)    All other components within normal limits  COMPREHENSIVE METABOLIC PANEL - Abnormal; Notable for the following components:   Glucose, Bld 149 (*)    All other components within normal limits  LIPASE, BLOOD    EKG None  Radiology CT ABDOMEN PELVIS WO CONTRAST  Result Date: 11/21/2022 CLINICAL DATA:  Right lower quadrant abdominal pain. No bowel movement for 5 days EXAM: CT ABDOMEN AND PELVIS WITHOUT CONTRAST TECHNIQUE: Multidetector CT imaging of the abdomen and pelvis was performed following the standard protocol without IV contrast. RADIATION DOSE REDUCTION: This  exam was performed according to the departmental dose-optimization program which includes automated exposure control, adjustment of the mA and/or kV according to patient size and/or use of iterative reconstruction technique. COMPARISON:  CT 10/03/2022 and older FINDINGS: Lower chest: Coronary artery calcifications are seen. There is some basilar scar atelectasis in the lungs. No pleural effusion. Calcified left lower lobe lung nodule consistent with old granulomatous disease. Hepatobiliary: Stones and sludge in the  gallbladder. Preserved hepatic parenchyma by noncontrast. Pancreas: Mild global pancreatic atrophy. Spleen: Small splenule.  The spleen is nonenlarged. Adrenals/Urinary Tract: The adrenal glands are preserved. Mild left-sided renal atrophy. No abnormal calcification seen within either kidney nor along the course of either ureter. Preserved contours of the urinary bladder. Stomach/Bowel: Large bowel has a normal course and caliber with scattered stool. Scattered colonic diverticula greatest in the left side. Surgical changes along the rectosigmoid colon. Stomach and small bowel are nondilated. Normal appendix. Vascular/Lymphatic: Normal caliber aorta and IVC with scattered vascular calcifications. No specific abnormal lymph node enlargement identified in the abdomen and pelvis. Reproductive: Uterine fibroid again seen.  No separate adnexal mass. Other: Once again there is a lipoma encasing the right the psoas muscle with displacement of surrounding structures with mass effect. No free air or free fluid. Small fat containing umbilical hernia. Musculoskeletal: Degenerative changes seen along the spine. Scattered degenerative changes of the pelvis. Osteopenia. Since the prior there has been placement kyphoplasty cement along the vertebral bodies at T11 and T10. Since the prior CT scan there is new compression of the superior endplate of T12. Stable compression of L3. IMPRESSION: New compression of the  superior endplate of T12 compared to the CT scan of October 03, 2022. Please correlate with exact time course of injury. Stable compression of L3. Interval placement of cement at T10 and T11. No bowel obstruction, free air or free fluid. Scattered colonic stool and diverticula. Normal appendix. Gallstones and sludge. Electronically Signed   By: Karen Kays M.D.   On: 11/21/2022 16:04    Procedures Procedures    Medications Ordered in ED Medications  metoCLOPramide (REGLAN) injection 10 mg (10 mg Intravenous Given 11/21/22 1152)  sodium phosphate (FLEET) 7-19 GM/118ML enema 1 enema (1 enema Rectal Given 11/21/22 1200)  iohexol (OMNIPAQUE) 300 MG/ML solution 100 mL (100 mLs Intravenous Contrast Given 11/21/22 1531)    ED Course/ Medical Decision Making/ A&P                             Medical Decision Making Amount and/or Complexity of Data Reviewed Labs: ordered. Decision-making details documented in ED Course. Radiology: ordered and independent interpretation performed. Decision-making details documented in ED Course.  Risk OTC drugs. Prescription drug management.   Pt with multiple medical problems and comorbidities and presenting today with a complaint that caries a high risk for morbidity and mortality.  Here today for abdominal pain, no bowel movements for the last 5 days and nausea and vomiting.  Patient does have some right lower quadrant pain but also was found to have fecal impaction on exam most likely related to her recent back surgery, need to take tramadol related to pain and being less active.  However patient also has a significant history of diverticulitis resulting in an ostomy and then ostomy takedown but denies history of obstructions.  Concern that patient's symptoms may be all related to constipation and fecal impaction however also concern for diverticulitis, obstruction.  Patient given antiemetics.  Stool was removed manually but will also do an enema.  Will reassess  to see if patient's symptoms improve after having a bowel movement if not we will proceed with imaging.  4:35 PM I independently interpreted patient's labs and CBC and CMP are within normal limits.  After an enema patient did have a fairly large bowel movement reports she feels better however on exam she still has right lower quadrant pain with  some mild guarding.  She reports is because she pulled a muscle when she was trying to get up from a chair but given her prior history we will do a scan to ensure no other underlying cause.  Patient has had no further vomiting.  4:35 PM I have independently visualized and interpreted pt's images today.  CT abd/pelvis neg for obstruction, appendicitis, diverticulitis.  Pt feeling better after stool and feel she can go home.  Her IV did infiltrate but will use compression and elevation.        Final Clinical Impression(s) / ED Diagnoses Final diagnoses:  Drug-induced constipation  Fecal impaction in rectum    Rx / DC Orders ED Discharge Orders     None         Gwyneth Sprout, MD 11/21/22 1635

## 2022-11-21 NOTE — ED Triage Notes (Signed)
Patient BIB [facility] from home. Has generalized abdominal pain and no bowel movement in the last 5 days. Feeling nauseous.

## 2022-11-22 DIAGNOSIS — K219 Gastro-esophageal reflux disease without esophagitis: Secondary | ICD-10-CM | POA: Diagnosis not present

## 2022-11-22 DIAGNOSIS — F419 Anxiety disorder, unspecified: Secondary | ICD-10-CM | POA: Diagnosis not present

## 2022-11-22 DIAGNOSIS — I10 Essential (primary) hypertension: Secondary | ICD-10-CM | POA: Diagnosis not present

## 2022-11-22 DIAGNOSIS — J45909 Unspecified asthma, uncomplicated: Secondary | ICD-10-CM | POA: Diagnosis not present

## 2022-11-22 DIAGNOSIS — E785 Hyperlipidemia, unspecified: Secondary | ICD-10-CM | POA: Diagnosis not present

## 2022-11-22 DIAGNOSIS — R7303 Prediabetes: Secondary | ICD-10-CM | POA: Diagnosis not present

## 2022-11-22 DIAGNOSIS — Z87442 Personal history of urinary calculi: Secondary | ICD-10-CM | POA: Diagnosis not present

## 2022-11-22 DIAGNOSIS — Z85828 Personal history of other malignant neoplasm of skin: Secondary | ICD-10-CM | POA: Diagnosis not present

## 2022-11-22 DIAGNOSIS — C9030 Solitary plasmacytoma not having achieved remission: Secondary | ICD-10-CM | POA: Diagnosis not present

## 2022-11-22 DIAGNOSIS — K5790 Diverticulosis of intestine, part unspecified, without perforation or abscess without bleeding: Secondary | ICD-10-CM | POA: Diagnosis not present

## 2022-11-22 DIAGNOSIS — M8458XD Pathological fracture in neoplastic disease, other specified site, subsequent encounter for fracture with routine healing: Secondary | ICD-10-CM | POA: Diagnosis not present

## 2022-11-22 DIAGNOSIS — Z79899 Other long term (current) drug therapy: Secondary | ICD-10-CM | POA: Diagnosis not present

## 2022-11-22 DIAGNOSIS — M1990 Unspecified osteoarthritis, unspecified site: Secondary | ICD-10-CM | POA: Diagnosis not present

## 2022-11-22 DIAGNOSIS — Z7982 Long term (current) use of aspirin: Secondary | ICD-10-CM | POA: Diagnosis not present

## 2022-11-22 DIAGNOSIS — Z79891 Long term (current) use of opiate analgesic: Secondary | ICD-10-CM | POA: Diagnosis not present

## 2022-11-23 DIAGNOSIS — M8458XD Pathological fracture in neoplastic disease, other specified site, subsequent encounter for fracture with routine healing: Secondary | ICD-10-CM | POA: Diagnosis not present

## 2022-11-23 DIAGNOSIS — M1990 Unspecified osteoarthritis, unspecified site: Secondary | ICD-10-CM | POA: Diagnosis not present

## 2022-11-23 DIAGNOSIS — Z79899 Other long term (current) drug therapy: Secondary | ICD-10-CM | POA: Diagnosis not present

## 2022-11-23 DIAGNOSIS — F419 Anxiety disorder, unspecified: Secondary | ICD-10-CM | POA: Diagnosis not present

## 2022-11-23 DIAGNOSIS — J45909 Unspecified asthma, uncomplicated: Secondary | ICD-10-CM | POA: Diagnosis not present

## 2022-11-23 DIAGNOSIS — C9030 Solitary plasmacytoma not having achieved remission: Secondary | ICD-10-CM | POA: Diagnosis not present

## 2022-11-26 DIAGNOSIS — C9030 Solitary plasmacytoma not having achieved remission: Secondary | ICD-10-CM | POA: Diagnosis not present

## 2022-11-26 DIAGNOSIS — M1990 Unspecified osteoarthritis, unspecified site: Secondary | ICD-10-CM | POA: Diagnosis not present

## 2022-11-26 DIAGNOSIS — J45909 Unspecified asthma, uncomplicated: Secondary | ICD-10-CM | POA: Diagnosis not present

## 2022-11-26 DIAGNOSIS — Z79899 Other long term (current) drug therapy: Secondary | ICD-10-CM | POA: Diagnosis not present

## 2022-11-26 DIAGNOSIS — M8458XD Pathological fracture in neoplastic disease, other specified site, subsequent encounter for fracture with routine healing: Secondary | ICD-10-CM | POA: Diagnosis not present

## 2022-11-26 DIAGNOSIS — F419 Anxiety disorder, unspecified: Secondary | ICD-10-CM | POA: Diagnosis not present

## 2022-11-29 ENCOUNTER — Other Ambulatory Visit: Payer: Self-pay | Admitting: *Deleted

## 2022-11-29 DIAGNOSIS — J45909 Unspecified asthma, uncomplicated: Secondary | ICD-10-CM | POA: Diagnosis not present

## 2022-11-29 DIAGNOSIS — Z79899 Other long term (current) drug therapy: Secondary | ICD-10-CM | POA: Diagnosis not present

## 2022-11-29 DIAGNOSIS — C9030 Solitary plasmacytoma not having achieved remission: Secondary | ICD-10-CM

## 2022-11-29 DIAGNOSIS — K219 Gastro-esophageal reflux disease without esophagitis: Secondary | ICD-10-CM | POA: Diagnosis not present

## 2022-11-29 DIAGNOSIS — I10 Essential (primary) hypertension: Secondary | ICD-10-CM | POA: Diagnosis not present

## 2022-11-29 DIAGNOSIS — M1990 Unspecified osteoarthritis, unspecified site: Secondary | ICD-10-CM | POA: Diagnosis not present

## 2022-11-30 DIAGNOSIS — M8458XD Pathological fracture in neoplastic disease, other specified site, subsequent encounter for fracture with routine healing: Secondary | ICD-10-CM | POA: Diagnosis not present

## 2022-11-30 DIAGNOSIS — J45909 Unspecified asthma, uncomplicated: Secondary | ICD-10-CM | POA: Diagnosis not present

## 2022-11-30 DIAGNOSIS — Z79899 Other long term (current) drug therapy: Secondary | ICD-10-CM | POA: Diagnosis not present

## 2022-11-30 DIAGNOSIS — F419 Anxiety disorder, unspecified: Secondary | ICD-10-CM | POA: Diagnosis not present

## 2022-11-30 DIAGNOSIS — C9030 Solitary plasmacytoma not having achieved remission: Secondary | ICD-10-CM | POA: Diagnosis not present

## 2022-11-30 DIAGNOSIS — M1990 Unspecified osteoarthritis, unspecified site: Secondary | ICD-10-CM | POA: Diagnosis not present

## 2022-12-02 NOTE — H&P (Signed)
Chief Complaint: Patient was seen in consultation today for plasmacytoma of thoracic spine at the request of Dorsey,John T IV  Referring Physician(s): Dorsey,John T IV  Supervising Physician: Marliss Coots  Patient Status: [facility] - Out-pt  History of Present Illness:  Alice Anthony is a 82 y.o. female who presented to the ED complaining of worsening back pain February 2024.  She underwent x-ray 09/17/2022 that showed concerning findings and subsequently underwent MRI thoracic spine 09/22/2022 that revealed acute or subacute T7, T10 and T11 compression fracture as well as L3 compression fracture with 20% height loss new from previous study 09/10/2022.  Biopsy of compression fracture at T11 10/10/2022 revealed plasmacytoma.  She was referred to Dr. Leonides Schanz for evaluation and management.  Patient has been referred to IR for bone marrow biopsy to rule out multiple myeloma.  Patient denies chills, fever, CP, leg swelling, N/V, dizziness or headaches. She endorses loss of appetite, fatigue, SOB, abdominal pain, constipation and back pain. Patient reports she ate 4 crackers with her medications this morning.    Past Medical History:  Diagnosis Date   Anxiety    Arthritis    Asthma    Hx: of   Cancer (HCC)    Hx: of skin cancer   Diverticulosis    GERD (gastroesophageal reflux disease)    Hx: of   History of kidney stones    Hyperlipemia    Hypertension    Pre-diabetes     Past Surgical History:  Procedure Laterality Date   ABDOMINAL SURGERY  09/19/2012   Ruptured Diverticuli, Ostomy Placed.   BREAST BIOPSY  06/06/2012   breast biopsy   CATARACT EXTRACTION W/ INTRAOCULAR LENS IMPLANT Bilateral    COLONOSCOPY  03/06/2012   COLOSTOMY CLOSURE  12/25/2012   Dr Luisa Hart   COLOSTOMY CLOSURE N/A 12/25/2012   Procedure: COLOSTOMY CLOSURE;  Surgeon: Clovis Pu. Cornett, MD;  Location: [facility] OR;  Service: General;  Laterality: N/A;   EXTRACORPOREAL SHOCK WAVE LITHOTRIPSY Left 11/17/2018    Procedure: EXTRACORPOREAL SHOCK WAVE LITHOTRIPSY (ESWL);  Surgeon: Malen Gauze, MD;  Location: [facility] ORS;  Service: Urology;  Laterality: Left;   EYE SURGERY  10/19/1998   Hx: of cyst removed from left eye   KYPHOPLASTY N/A 10/10/2022   Procedure: KYPHOPLASTY Thoracic seven , Thoracic ten,Thoracic eleven;  Surgeon: Tressie Stalker, MD;  Location: [facility] OR;  Service: Neurosurgery;  Laterality: N/A;   LAPAROTOMY N/A 09/19/2012   Procedure: EXPLORATORY LAPAROTOMY, sigmoid colectomy;  Surgeon: Clovis Pu. Cornett, MD;  Location: [facility] ORS;  Service: General;  Laterality: N/A;   SIGMOIDOSCOPY  01/11/1999   TONSILLECTOMY     TRIGGER FINGER RELEASE  12/25/2010   TUBAL LIGATION  10/16/1985    Allergies: Vioxx [rofecoxib], Codeine, Doxycycline hyclate, Hydrocodone, and Latex  Medications: Prior to Admission medications   Medication Sig Start Date End Date Taking? Authorizing Provider  acetaminophen (TYLENOL) 500 MG tablet Take 1,000 mg by mouth every 6 (six) hours as needed for moderate pain.    [provider]  amLODipine (NORVASC) 5 MG tablet Take 5 mg by mouth daily.    [provider]  Ascorbic Acid (VITAMIN C) 1000 MG tablet Take 1,000 mg by mouth daily.    [provider]  aspirin 81 MG tablet Take 81 mg by mouth daily.    [provider]  calcium carbonate (TUMS - DOSED IN MG ELEMENTAL CALCIUM) 500 MG chewable tablet Chew 1 tablet by mouth 2 (two) times daily as needed for indigestion.  [provider]  cetirizine (ZYRTEC) 10 MG tablet Take 10 mg by mouth at bedtime.    [provider]  Cholecalciferol (VITAMIN D3) 2000 UNITS capsule Take 2,000 Units by mouth daily.    [provider]  dextromethorphan-guaiFENesin (TUSSIN DM) 10-100 MG/5ML liquid Take 10 mLs by mouth every 4 (four) hours as needed for cough.    [provider]  docusate sodium (COLACE) 50 MG capsule Take 50 mg by mouth daily.    [provider]   fluticasone (FLONASE) 50 MCG/ACT nasal spray Place 2 sprays into the nose at bedtime.    [provider]  glimepiride (AMARYL) 2 MG tablet Take 1-2 mg by mouth daily as needed (high blood sugar).    [provider]  hydrOXYzine (ATARAX) 25 MG tablet Take 25 mg by mouth at bedtime.    [provider]  KRILL OIL PO Take 1 tablet by mouth daily.    [provider]  metoCLOPramide (REGLAN) 10 MG tablet Take 1 tablet (10 mg total) by mouth every 6 (six) hours. 10/03/22   Gerhard Munch, MD  Multiple Vitamin (MULTIVITAMIN WITH MINERALS) TABS Take 1 tablet by mouth daily.    [provider]  polyethylene glycol powder (MIRALAX) 17 GM/SCOOP powder Take 1 Container by mouth daily. 10/05/22   [provider]  simvastatin (ZOCOR) 20 MG tablet Take 20 mg by mouth every evening.    [provider]  traMADol (ULTRAM) 50 MG tablet Take 50-100 mg by mouth every 6 (six) hours as needed. 10/19/22   [provider]     Family History  Problem Relation Age of Onset   Diabetes Father    Alzheimer's disease Father    Basal cell carcinoma Mother        Metastatic    Social History   Socioeconomic History   Marital status: Widowed    Spouse name: Not on file   Number of children: Not on file   Years of education: Not on file   Highest education level: Not on file  Occupational History   Not on file  Tobacco Use   Smoking status: Never   Smokeless tobacco: Never  Vaping Use   Vaping Use: Never used  Substance and Sexual Activity   Alcohol use: No   Drug use: No   Sexual activity: Not Currently  Other Topics Concern   Not on file  Social History Narrative   Not on file   Social Determinants of Health   Financial Resource Strain: Not on file  Food Insecurity: Not on file  Transportation Needs: Not on file  Physical Activity: Not on file  Stress: Not on file  Social Connections: Not on file    Review of Systems: A 12  point ROS discussed and pertinent positives are indicated in the HPI above.  All other systems are negative.  Review of Systems  Constitutional:  Positive for appetite change and fatigue. Negative for chills and fever.  Respiratory:  Positive for shortness of breath.   Cardiovascular:  Negative for chest pain and leg swelling.  Gastrointestinal:  Positive for abdominal pain and constipation. Negative for nausea and vomiting.  Musculoskeletal:  Positive for back pain.  Neurological:  Negative for dizziness and headaches.    Vital Signs: BP (!) 160/53   Pulse 98   Temp 98.5 F (36.9 C) (Oral)   Resp 17   Ht 5\' 1"  (1.549 m)   Wt 128 lb (58.1 kg)   SpO2  99%   BMI 24.19 kg/m     Physical Exam Vitals reviewed.  Constitutional:      General: She is not in acute distress.    Appearance: Normal appearance. She is not ill-appearing.  HENT:     Head: Normocephalic and atraumatic.     Mouth/Throat:     Mouth: Mucous membranes are dry.     Pharynx: Oropharynx is clear.  Eyes:     Extraocular Movements: Extraocular movements intact.     Pupils: Pupils are equal, round, and reactive to light.  Cardiovascular:     Rate and Rhythm: Normal rate and regular rhythm.     Pulses: Normal pulses.     Heart sounds: Normal heart sounds.  Pulmonary:     Effort: Pulmonary effort is normal. No respiratory distress.     Breath sounds: Normal breath sounds.  Abdominal:     General: Bowel sounds are normal. There is no distension.     Palpations: Abdomen is soft.     Tenderness: There is no abdominal tenderness. There is no guarding.  Musculoskeletal:     Right lower leg: No edema.     Left lower leg: No edema.  Skin:    General: Skin is warm and dry.  Neurological:     Mental Status: She is alert and oriented to person, place, and time.  Psychiatric:        Mood and Affect: Mood normal.        Behavior: Behavior normal.        Thought Content: Thought content normal.        Judgment:  Judgment normal.     Imaging: CT ABDOMEN PELVIS WO CONTRAST  Result Date: 11/21/2022 CLINICAL DATA:  Right lower quadrant abdominal pain. No bowel movement for 5 days EXAM: CT ABDOMEN AND PELVIS WITHOUT CONTRAST TECHNIQUE: Multidetector CT imaging of the abdomen and pelvis was performed following the standard protocol without IV contrast. RADIATION DOSE REDUCTION: This exam was performed according to the departmental dose-optimization program which includes automated exposure control, adjustment of the mA and/or kV according to patient size and/or use of iterative reconstruction technique. COMPARISON:  CT 10/03/2022 and older FINDINGS: Lower chest: Coronary artery calcifications are seen. There is some basilar scar atelectasis in the lungs. No pleural effusion. Calcified left lower lobe lung nodule consistent with old granulomatous disease. Hepatobiliary: Stones and sludge in the gallbladder. Preserved hepatic parenchyma by noncontrast. Pancreas: Mild global pancreatic atrophy. Spleen: Small splenule.  The spleen is nonenlarged. Adrenals/Urinary Tract: The adrenal glands are preserved. Mild left-sided renal atrophy. No abnormal calcification seen within either kidney nor along the course of either ureter. Preserved contours of the urinary bladder. Stomach/Bowel: Large bowel has a normal course and caliber with scattered stool. Scattered colonic diverticula greatest in the left side. Surgical changes along the rectosigmoid colon. Stomach and small bowel are nondilated. Normal appendix. Vascular/Lymphatic: Normal caliber aorta and IVC with scattered vascular calcifications. No specific abnormal lymph node enlargement identified in the abdomen and pelvis. Reproductive: Uterine fibroid again seen.  No separate adnexal mass. Other: Once again there is a lipoma encasing the right the psoas muscle with displacement of surrounding structures with mass effect. No free air or free fluid. Small fat containing umbilical  hernia. Musculoskeletal: Degenerative changes seen along the spine. Scattered degenerative changes of the pelvis. Osteopenia. Since the prior there has been placement kyphoplasty cement along the vertebral bodies at T11 and T10. Since the prior CT scan there is new compression of  the superior endplate of T12. Stable compression of L3. IMPRESSION: New compression of the superior endplate of T12 compared to the CT scan of October 03, 2022. Please correlate with exact time course of injury. Stable compression of L3. Interval placement of cement at T10 and T11. No bowel obstruction, free air or free fluid. Scattered colonic stool and diverticula. Normal appendix. Gallstones and sludge. Electronically Signed   By: Karen Kays M.D.   On: 11/21/2022 16:04    Labs:  CBC: Recent Labs    10/03/22 1316 10/03/22 1322 11/14/22 1036 11/21/22 1123 12/06/22 0722  WBC 4.2  --  4.1 6.6 5.6  HGB 11.9* 11.6* 11.9* 11.2* 11.3*  HCT 34.8* 34.0* 32.8* 33.0* 32.9*  PLT 201  --  166 170 184    COAGS: No results for input(s): "INR", "APTT" in the last 8760 hours.  BMP: Recent Labs    09/19/22 1327 10/03/22 1316 10/03/22 1322 11/14/22 1036 11/21/22 1123  NA 139 141 141 144 142  K 3.5 3.6 3.7 4.1 3.8  CL 105 103 103 106 106  CO2 25 26  --  32 23  GLUCOSE 190* 174* 167* 124* 149*  BUN 12 7* 7* 18 11  CALCIUM 8.8* 9.7  --  10.3 9.7  CREATININE 0.68 0.77 0.60 0.81 0.69  GFRNONAA >60 >60  --  >60 >60    LIVER FUNCTION TESTS: Recent Labs    09/19/22 1327 10/03/22 1316 11/14/22 1036 11/21/22 1123  BILITOT 0.8 0.9 0.5 0.9  AST 20 24 36 22  ALT 17 16 27 19   ALKPHOS 87 86 85 74  PROT 6.9 6.1* 7.0 6.6  ALBUMIN 4.4 4.0 4.8 4.5    TUMOR MARKERS: No results for input(s): "AFPTM", "CEA", "CA199", "CHROMGRNA" in the last 8760 hours.  Assessment and Plan:  82 year old female with PMHx significant for HTN, HLD, GERD, asthma, arthritis and recently diagnosed plasmacytoma of the thoracic spine  presents to IR for bone marrow biopsy.  Patient resting in bed with daughter at bedside. She is alert and oriented, calm and pleasant. She is in no distress Dr. Elby Showers made aware that patient ate prior to arrival.  Risks and benefits of bone marrow biopsy and aspiration with moderate sedation was discussed with the patient and/or patient's family including, but not limited to bleeding, infection, damage to adjacent structures or low yield requiring additional tests.  All of the questions were answered and there is agreement to proceed.  Consent signed and in chart.  Thank you for this interesting consult.  I greatly enjoyed meeting SURVEEN MONSIVAIS and look forward to participating in their care.  A copy of this report was sent to the requesting provider on this date.  Electronically Signed: Shon Hough, NP 12/06/2022, 8:25 AM   I spent a total of 20 minutes in face to face in clinical consultation, greater than 50% of which was counseling/coordinating care for plasmacytoma of thoracic spine.

## 2022-12-04 DIAGNOSIS — Z79899 Other long term (current) drug therapy: Secondary | ICD-10-CM | POA: Diagnosis not present

## 2022-12-04 DIAGNOSIS — J45909 Unspecified asthma, uncomplicated: Secondary | ICD-10-CM | POA: Diagnosis not present

## 2022-12-04 DIAGNOSIS — M1990 Unspecified osteoarthritis, unspecified site: Secondary | ICD-10-CM | POA: Diagnosis not present

## 2022-12-04 DIAGNOSIS — F419 Anxiety disorder, unspecified: Secondary | ICD-10-CM | POA: Diagnosis not present

## 2022-12-04 DIAGNOSIS — C9030 Solitary plasmacytoma not having achieved remission: Secondary | ICD-10-CM | POA: Diagnosis not present

## 2022-12-04 DIAGNOSIS — M8458XD Pathological fracture in neoplastic disease, other specified site, subsequent encounter for fracture with routine healing: Secondary | ICD-10-CM | POA: Diagnosis not present

## 2022-12-04 LAB — UPEP/UIFE/LIGHT CHAINS/TP, 24-HR UR
% BETA, Urine: 13.1 %
ALPHA 1 URINE: 1.3 %
Albumin, U: 5.2 %
Alpha 2, Urine: 2 %
Free Kappa Lt Chains,Ur: 5501.15 mg/L — ABNORMAL HIGH (ref 1.17–86.46)
Free Kappa/Lambda Ratio: 735.45 — ABNORMAL HIGH (ref 1.83–14.26)
Free Lambda Lt Chains,Ur: 7.48 mg/L (ref 0.27–15.21)
GAMMA GLOBULIN URINE: 78.5 %
M-SPIKE %, Urine: 69.8 % — ABNORMAL HIGH
M-Spike, Mg/24 Hr: 1271 mg/24 hr — ABNORMAL HIGH
Total Protein, Urine-Ur/day: 1821 mg/24 hr — ABNORMAL HIGH (ref 30–150)
Total Protein, Urine: 117.5 mg/dL
Total Volume: 1550

## 2022-12-05 ENCOUNTER — Other Ambulatory Visit: Payer: Self-pay | Admitting: Student

## 2022-12-05 DIAGNOSIS — Z01818 Encounter for other preprocedural examination: Secondary | ICD-10-CM

## 2022-12-06 ENCOUNTER — Encounter (HOSPITAL_COMMUNITY): Payer: Self-pay

## 2022-12-06 ENCOUNTER — Ambulatory Visit (HOSPITAL_COMMUNITY)
Admission: RE | Admit: 2022-12-06 | Discharge: 2022-12-06 | Disposition: A | Discharge status: Home / Self Care | Payer: Medicare Other | Source: Ambulatory Visit | Attending: Hematology and Oncology | Admitting: Hematology and Oncology

## 2022-12-06 ENCOUNTER — Other Ambulatory Visit: Payer: Self-pay

## 2022-12-06 DIAGNOSIS — D539 Nutritional anemia, unspecified: Secondary | ICD-10-CM | POA: Insufficient documentation

## 2022-12-06 DIAGNOSIS — D649 Anemia, unspecified: Secondary | ICD-10-CM | POA: Diagnosis not present

## 2022-12-06 DIAGNOSIS — C9030 Solitary plasmacytoma not having achieved remission: Secondary | ICD-10-CM | POA: Diagnosis not present

## 2022-12-06 DIAGNOSIS — C9000 Multiple myeloma not having achieved remission: Secondary | ICD-10-CM | POA: Diagnosis not present

## 2022-12-06 DIAGNOSIS — Z01818 Encounter for other preprocedural examination: Secondary | ICD-10-CM

## 2022-12-06 LAB — CBC
HCT: 32.9 % — ABNORMAL LOW (ref 36.0–46.0)
Hemoglobin: 11.3 g/dL — ABNORMAL LOW (ref 12.0–15.0)
MCH: 35.3 pg — ABNORMAL HIGH (ref 26.0–34.0)
MCHC: 34.3 g/dL (ref 30.0–36.0)
MCV: 102.8 fL — ABNORMAL HIGH (ref 80.0–100.0)
Platelets: 184 10*3/uL (ref 150–400)
RBC: 3.2 MIL/uL — ABNORMAL LOW (ref 3.87–5.11)
RDW: 13.7 % (ref 11.5–15.5)
WBC: 5.6 10*3/uL (ref 4.0–10.5)
nRBC: 0 % (ref 0.0–0.2)

## 2022-12-06 LAB — GLUCOSE, CAPILLARY: Glucose-Capillary: 160 mg/dL — ABNORMAL HIGH (ref 70–99)

## 2022-12-06 MED ORDER — METHOCARBAMOL 500 MG PO TABS
500.0000 mg | ORAL_TABLET | Freq: Once | ORAL | Status: AC
  Administered 2022-12-06: 500 mg via ORAL
  Filled 2022-12-06: qty 1

## 2022-12-06 MED ORDER — DIPHENHYDRAMINE HCL 50 MG/ML IJ SOLN
INTRAMUSCULAR | Status: AC
  Filled 2022-12-06: qty 1

## 2022-12-06 MED ORDER — METHOCARBAMOL 500 MG PO TABS
ORAL_TABLET | ORAL | Status: AC
  Filled 2022-12-06: qty 1

## 2022-12-06 MED ORDER — FENTANYL CITRATE (PF) 100 MCG/2ML IJ SOLN
INTRAMUSCULAR | Status: AC
  Filled 2022-12-06: qty 2

## 2022-12-06 MED ORDER — LIDOCAINE HCL 1 % IJ SOLN: INTRAMUSCULAR | Status: AC | PRN

## 2022-12-06 MED ORDER — SODIUM CHLORIDE 0.9 % IV SOLN: INTRAVENOUS | Status: DC

## 2022-12-06 MED ORDER — DIPHENHYDRAMINE HCL 50 MG/ML IJ SOLN
INTRAMUSCULAR | Status: AC | PRN
  Administered 2022-12-06: 25 mg via INTRAVENOUS

## 2022-12-06 MED ORDER — FENTANYL CITRATE (PF) 100 MCG/2ML IJ SOLN
INTRAMUSCULAR | Status: AC | PRN
  Administered 2022-12-06 (×2): 50 ug via INTRAVENOUS

## 2022-12-06 MED ORDER — LIDOCAINE-EPINEPHRINE 1 %-1:100000 IJ SOLN
INTRAMUSCULAR | Status: AC | PRN
  Administered 2022-12-06: 20 mL

## 2022-12-06 MED ORDER — OXYCODONE HCL 5 MG PO TABS
5.0000 mg | ORAL_TABLET | Freq: Once | ORAL | Status: AC
  Administered 2022-12-06: 5 mg via ORAL

## 2022-12-06 MED ORDER — OXYCODONE HCL 5 MG PO TABS
ORAL_TABLET | ORAL | Status: AC
  Filled 2022-12-06: qty 1

## 2022-12-06 MED ORDER — ONDANSETRON 4 MG PO TBDP
4.0000 mg | ORAL_TABLET | Freq: Once | ORAL | Status: AC
  Administered 2022-12-06: 4 mg via ORAL
  Filled 2022-12-06 (×2): qty 1

## 2022-12-06 NOTE — Discharge Instructions (Signed)
Bone Marrow Aspiration and Biopsy: What to Expect at Home  Urgent needs - Interventional Radiology clinic [phone]  The biopsy site may feel sore for several days. You may have a bruise on the site.  How can you care for yourself at home? Do light activities such as walking, unless you are told otherwise. Eat your normal diet, unless you are told otherwise. Most people are able to return to work the day after the procedure.  Caring for your biopsy site Check your biopsy site every day until it heals. Keep the bandage dry. You can take the bandage off and shower tomorrow Do not soak in a bathtub or hot tub or go swimming until the biopsy site is healed or until your doctor says you can. If you are bleeding from the biopsy site, apply firm pressure on the area for at least 30 minutes If directed, apply ice to your biopsy site 2-3 times a day. Put ice in a plastic bag Place a towel between your skin and the ice bag Leave the ice in place for 20 minutes 2-3 times a day Medicines If you take aspirin or some other blood thinner, ask your doctor if and when to start taking it again. If the doctor gave you a prescription medicine for pain, take it as prescribed. If you are not taking a prescription pain medicine, you may take acetaminophen (Tylenol) as directed. Read and follow all instructions on the label. If your doctor prescribed antibiotics, take them as directed. Do not stop taking them just because you feel better.  Contact your doctor or seek immediate medical care if: You have bright red bleeding from the biopsy site that does not stop after 30 minutes of holding direct pressure on the site. You have signs of infection, such as: Increased pain, swelling, warmth, or redness at the biopsy site. Red streaks leading from the biopsy site. Yellow or green drainage from the biopsy site. A fever (temperature over 100.23F) chills, or both.  Follow-up care is a key part of your treatment  and safety. Be sure to make and go to all appointments, and call your doctor if you are having any problems.

## 2022-12-06 NOTE — Procedures (Signed)
Interventional Radiology Procedure Note  Procedure: CT guided aspirate and core biopsy of right iliac bone  Complications: None  Recommendations: - Bedrest supine x 1 hrs - Hydrocodone PRN  Pain - Follow biopsy results   Alice Mcclatchey, MD   

## 2022-12-10 ENCOUNTER — Encounter (HOSPITAL_COMMUNITY)
Admission: RE | Admit: 2022-12-10 | Discharge: 2022-12-10 | Disposition: A | Discharge status: Home / Self Care | Payer: Medicare Other | Source: Ambulatory Visit | Attending: Hematology and Oncology | Admitting: Hematology and Oncology

## 2022-12-10 DIAGNOSIS — C9030 Solitary plasmacytoma not having achieved remission: Secondary | ICD-10-CM | POA: Diagnosis not present

## 2022-12-10 LAB — GLUCOSE, CAPILLARY: Glucose-Capillary: 155 mg/dL — ABNORMAL HIGH (ref 70–99)

## 2022-12-10 MED ORDER — FLUDEOXYGLUCOSE F - 18 (FDG) INJECTION
6.4000 | Freq: Once | INTRAVENOUS | Status: AC | PRN
  Administered 2022-12-10: 6.4 via INTRAVENOUS

## 2022-12-11 DIAGNOSIS — J45909 Unspecified asthma, uncomplicated: Secondary | ICD-10-CM | POA: Diagnosis not present

## 2022-12-11 DIAGNOSIS — M8458XD Pathological fracture in neoplastic disease, other specified site, subsequent encounter for fracture with routine healing: Secondary | ICD-10-CM | POA: Diagnosis not present

## 2022-12-11 DIAGNOSIS — M1990 Unspecified osteoarthritis, unspecified site: Secondary | ICD-10-CM | POA: Diagnosis not present

## 2022-12-11 DIAGNOSIS — F419 Anxiety disorder, unspecified: Secondary | ICD-10-CM | POA: Diagnosis not present

## 2022-12-11 DIAGNOSIS — C9030 Solitary plasmacytoma not having achieved remission: Secondary | ICD-10-CM | POA: Diagnosis not present

## 2022-12-11 DIAGNOSIS — Z79899 Other long term (current) drug therapy: Secondary | ICD-10-CM | POA: Diagnosis not present

## 2022-12-11 LAB — SURGICAL PATHOLOGY

## 2022-12-13 ENCOUNTER — Telehealth: Payer: Self-pay | Admitting: Physician Assistant

## 2022-12-13 MED ORDER — DEXAMETHASONE 20 MG PO TABS: ORAL_TABLET | ORAL | 0 refills | Status: DC

## 2022-12-13 NOTE — Telephone Encounter (Signed)
I called patient's daughter, Bonney Roussel to review the bone marrow biopsy results. Findings confirm multiple myeloma. Pet scan from 12/10/2022 was negative.   I reviewed Dr. Derek Mound recommendations for Velcade/Dex as treatment for multiple myeloma. After discussion with Dr. Candise Che in Dr. Derek Mound absence, I sent dexamethasone 40 mg weekly. She is scheduled for follow up with Dr. Leonides Schanz on 12/21/2022. We will tentative plan to start Velcade/dex same day.   Ms. Cyrder expressed understanding of the plan provided.

## 2022-12-14 ENCOUNTER — Other Ambulatory Visit: Payer: Self-pay | Admitting: Physician Assistant

## 2022-12-14 ENCOUNTER — Telehealth: Payer: Self-pay | Admitting: Hematology and Oncology

## 2022-12-14 MED ORDER — DEXAMETHASONE 4 MG PO TABS: ORAL_TABLET | ORAL | 0 refills | Status: DC

## 2022-12-17 ENCOUNTER — Telehealth: Payer: Self-pay | Admitting: *Deleted

## 2022-12-17 ENCOUNTER — Encounter (HOSPITAL_COMMUNITY): Payer: Self-pay | Admitting: Hematology and Oncology

## 2022-12-17 NOTE — Telephone Encounter (Signed)
Received call from pt's daughter, [name] Cryder. She called to help understanding her mother's schedule in the weeks ahead for her chemo and lab/office visits. Advised that her mother's appts would be weekly, best if they are on the same day of the week. Advised that she would see Dr. Leonides Schanz or Georga Kaufmann, PA every other week. Advised she would not get treatment this as Dr. Leonides Schanz needs to put the orders in to get insurance approval and then scheduled. He will be back in the office on 12/19/22. We should be able to start treatments next week.  Encouraged [name] in that once we get going, her mom's anxiety about this will hopefully settle down.  [name] feels that it will. She is fully aware of appts on 12/21/22

## 2022-12-18 ENCOUNTER — Telehealth: Payer: Self-pay | Admitting: Hematology and Oncology

## 2022-12-19 ENCOUNTER — Encounter (HOSPITAL_COMMUNITY): Payer: Self-pay | Admitting: Hematology and Oncology

## 2022-12-19 ENCOUNTER — Other Ambulatory Visit: Payer: Self-pay | Admitting: Hematology and Oncology

## 2022-12-19 ENCOUNTER — Encounter: Payer: Self-pay | Admitting: Hematology and Oncology

## 2022-12-19 DIAGNOSIS — C9000 Multiple myeloma not having achieved remission: Secondary | ICD-10-CM | POA: Insufficient documentation

## 2022-12-19 MED ORDER — ONDANSETRON HCL 8 MG PO TABS: 8.0000 mg | ORAL_TABLET | Freq: Three times a day (TID) | ORAL | 0 refills | Status: DC | PRN

## 2022-12-19 MED ORDER — ACYCLOVIR 400 MG PO TABS: 400.0000 mg | ORAL_TABLET | Freq: Two times a day (BID) | ORAL | 1 refills | Status: DC

## 2022-12-19 MED ORDER — PROCHLORPERAZINE MALEATE 10 MG PO TABS: 10.0000 mg | ORAL_TABLET | Freq: Four times a day (QID) | ORAL | 0 refills | Status: DC | PRN

## 2022-12-19 NOTE — Progress Notes (Signed)
START ON PATHWAY REGIMEN - Multiple Myeloma and Other Plasma Cell Dyscrasias     A cycle is every 21 days:     Bortezomib      Lenalidomide      Dexamethasone   **Always confirm dose/schedule in your pharmacy ordering system**  Patient Characteristics: Multiple Myeloma, Newly Diagnosed, Transplant Ineligible or Refused, Unknown or Awaiting Test Results Disease Classification: Multiple Myeloma Therapeutic Status: Newly Diagnosed R2-ISS Staging: II Is Patient Eligible for Transplant<= Transplant Ineligible or Refused Risk Status: Awaiting Test Results Intent of Therapy: Curative Intent, Discussed with Patient

## 2022-12-20 ENCOUNTER — Other Ambulatory Visit: Payer: Self-pay

## 2022-12-21 ENCOUNTER — Inpatient Hospital Stay: Payer: Medicare Other

## 2022-12-21 ENCOUNTER — Other Ambulatory Visit: Payer: Self-pay

## 2022-12-21 ENCOUNTER — Inpatient Hospital Stay: Payer: Medicare Other | Attending: Hematology and Oncology | Admitting: Hematology and Oncology

## 2022-12-21 ENCOUNTER — Other Ambulatory Visit: Payer: Self-pay | Admitting: Hematology and Oncology

## 2022-12-21 ENCOUNTER — Other Ambulatory Visit: Payer: Self-pay | Admitting: *Deleted

## 2022-12-21 ENCOUNTER — Encounter: Payer: Self-pay | Admitting: General Practice

## 2022-12-21 VITALS — BP 123/54 | HR 87 | Temp 98.5°F | Resp 16

## 2022-12-21 DIAGNOSIS — Z79899 Other long term (current) drug therapy: Secondary | ICD-10-CM | POA: Diagnosis not present

## 2022-12-21 DIAGNOSIS — F419 Anxiety disorder, unspecified: Secondary | ICD-10-CM | POA: Diagnosis not present

## 2022-12-21 DIAGNOSIS — Z5112 Encounter for antineoplastic immunotherapy: Secondary | ICD-10-CM | POA: Insufficient documentation

## 2022-12-21 DIAGNOSIS — M1990 Unspecified osteoarthritis, unspecified site: Secondary | ICD-10-CM | POA: Diagnosis not present

## 2022-12-21 DIAGNOSIS — C9000 Multiple myeloma not having achieved remission: Secondary | ICD-10-CM

## 2022-12-21 DIAGNOSIS — M8458XD Pathological fracture in neoplastic disease, other specified site, subsequent encounter for fracture with routine healing: Secondary | ICD-10-CM | POA: Diagnosis not present

## 2022-12-21 DIAGNOSIS — J45909 Unspecified asthma, uncomplicated: Secondary | ICD-10-CM | POA: Diagnosis not present

## 2022-12-21 DIAGNOSIS — C9030 Solitary plasmacytoma not having achieved remission: Secondary | ICD-10-CM | POA: Diagnosis not present

## 2022-12-21 LAB — CMP (CANCER CENTER ONLY)
ALT: 19 U/L (ref 0–44)
AST: 24 U/L (ref 15–41)
Albumin: 4.7 g/dL (ref 3.5–5.0)
Alkaline Phosphatase: 90 U/L (ref 38–126)
Anion gap: 9 (ref 5–15)
BUN: 11 mg/dL (ref 8–23)
CO2: 27 mmol/L (ref 22–32)
Calcium: 9.8 mg/dL (ref 8.9–10.3)
Chloride: 106 mmol/L (ref 98–111)
Creatinine: 0.72 mg/dL (ref 0.44–1.00)
GFR, Estimated: >60 mL/min (ref >60)
Glucose, Bld: 118 mg/dL — ABNORMAL HIGH (ref 70–99)
Potassium: 4.1 mmol/L (ref 3.5–5.1)
Sodium: 142 mmol/L (ref 135–145)
Total Bilirubin: 0.5 mg/dL (ref 0.3–1.2)
Total Protein: 6.8 g/dL (ref 6.5–8.1)

## 2022-12-21 LAB — CBC WITH DIFFERENTIAL (CANCER CENTER ONLY)
Abs Immature Granulocytes: 0.01 10*3/uL (ref 0.00–0.07)
Basophils Absolute: 0.1 10*3/uL (ref 0.0–0.1)
Basophils Relative: 1 %
Eosinophils Absolute: 0.3 10*3/uL (ref 0.0–0.5)
Eosinophils Relative: 6 %
HCT: 31.5 % — ABNORMAL LOW (ref 36.0–46.0)
Hemoglobin: 11.1 g/dL — ABNORMAL LOW (ref 12.0–15.0)
Immature Granulocytes: 0 %
Lymphocytes Relative: 37 %
Lymphs Abs: 1.7 10*3/uL (ref 0.7–4.0)
MCH: 35.9 pg — ABNORMAL HIGH (ref 26.0–34.0)
MCHC: 35.2 g/dL (ref 30.0–36.0)
MCV: 101.9 fL — ABNORMAL HIGH (ref 80.0–100.0)
Monocytes Absolute: 0.4 10*3/uL (ref 0.1–1.0)
Monocytes Relative: 10 %
Neutro Abs: 2.1 10*3/uL (ref 1.7–7.7)
Neutrophils Relative %: 46 %
Platelet Count: 173 10*3/uL (ref 150–400)
RBC: 3.09 MIL/uL — ABNORMAL LOW (ref 3.87–5.11)
RDW: 13.5 % (ref 11.5–15.5)
WBC Count: 4.6 10*3/uL (ref 4.0–10.5)
nRBC: 0 % (ref 0.0–0.2)

## 2022-12-21 LAB — LACTATE DEHYDROGENASE: LDH: 212 U/L — ABNORMAL HIGH (ref 98–192)

## 2022-12-21 MED ORDER — METOCLOPRAMIDE HCL 10 MG PO TABS: 10.0000 mg | ORAL_TABLET | Freq: Four times a day (QID) | ORAL | 0 refills | Status: DC

## 2022-12-21 NOTE — Progress Notes (Signed)
[facility] [phone]   [phone]  PROGRESS NOTE  Patient Care Team: [name], MD as PCP - General (Family Medicine) Pickenpack-Cousar, [name], NP as Nurse Practitioner (Nurse Practitioner)  Hematological/Oncological History # Plasmacytoma of Thoracic Spine  # IgG Kappa Multiple myeloma 09/22/2022: Patient underwent MRI thoracic spine which showed acute or subacute T7, T10, and T11 compression fractures 10/10/2022: biopsy of compression fracture of T11 reveals a plasmacytoma 11/13/2022: establish care with Dr. Leonides Schanz  12/06/2022: bone marrow biopsy showed plasma cell myeloma involving 80% of the marrow.  12/24/2022: Cycle 1 Day 1 of Velcade/Dex  Interval History:  Alice Anthony 82 y.o. female with medical history significant for newly diagnosed IgG kappa multiple myeloma who presents for a follow up visit. The patient's last visit was on 11/13/2022 at which time she established care. In the interim since the last visit bone marrow biopsy is confirm she has systemic multiple myeloma on top of her plasmacytoma of the thoracic spine.  On exam today Alice Anthony is accompanied by her daughter.  She reports that she has been well in the interim since her last visit and tolerated her bone marrow biopsy well.  She reports she does have some occasional bouts of constipation but no other symptoms.  She is not having any worsening of her bone or back pain.  She denies any fevers, chills, sweats, nausea, vomiting or diarrhea.  A full 10 point ROS is otherwise negative.  The bulk of our discussion focused on assuring she had everything necessary to start her chemotherapy treatment.  Details of our conversation are noted below.  MEDICAL HISTORY:  Past Medical History:  Diagnosis Date   Anxiety    Arthritis    Asthma    Hx: of   Cancer (HCC)    Hx: of skin cancer   Diverticulosis    GERD (gastroesophageal reflux disease)    Hx: of   History of kidney  stones    Hyperlipemia    Hypertension    Pre-diabetes     SURGICAL HISTORY: Past Surgical History:  Procedure Laterality Date   ABDOMINAL SURGERY  09/19/2012   Ruptured Diverticuli, Ostomy Placed.   BREAST BIOPSY  06/06/2012   breast biopsy   CATARACT EXTRACTION W/ INTRAOCULAR LENS IMPLANT Bilateral    COLONOSCOPY  03/06/2012   COLOSTOMY CLOSURE  12/25/2012   Dr Luisa Hart   COLOSTOMY CLOSURE N/A 12/25/2012   Procedure: COLOSTOMY CLOSURE;  Surgeon: Clovis Pu. Cornett, MD;  Location: [facility] OR;  Service: General;  Laterality: N/A;   EXTRACORPOREAL SHOCK WAVE LITHOTRIPSY Left 11/17/2018   Procedure: EXTRACORPOREAL SHOCK WAVE LITHOTRIPSY (ESWL);  Surgeon: Malen Gauze, MD;  Location: [facility] ORS;  Service: Urology;  Laterality: Left;   EYE SURGERY  10/19/1998   Hx: of cyst removed from left eye   KYPHOPLASTY N/A 10/10/2022   Procedure: KYPHOPLASTY Thoracic seven , Thoracic ten,Thoracic eleven;  Surgeon: Tressie Stalker, MD;  Location: [facility] OR;  Service: Neurosurgery;  Laterality: N/A;   LAPAROTOMY N/A 09/19/2012   Procedure: EXPLORATORY LAPAROTOMY, sigmoid colectomy;  Surgeon: Clovis Pu. Cornett, MD;  Location: [facility] ORS;  Service: General;  Laterality: N/A;   SIGMOIDOSCOPY  01/11/1999   TONSILLECTOMY     TRIGGER FINGER RELEASE  12/25/2010   TUBAL LIGATION  10/16/1985    SOCIAL HISTORY: Social History   Socioeconomic History   Marital status: Widowed    Spouse name: Not on file   Number of children: Not on file   Years of  education: Not on file   Highest education level: Not on file  Occupational History   Not on file  Tobacco Use   Smoking status: Never   Smokeless tobacco: Never  Vaping Use   Vaping Use: Never used  Substance and Sexual Activity   Alcohol use: No   Drug use: No   Sexual activity: Not Currently  Other Topics Concern   Not on file  Social History Narrative   Not on file   Social Determinants of Health   Financial Resource Strain: Not on file  Food  Insecurity: Not on file  Transportation Needs: Not on file  Physical Activity: Not on file  Stress: Not on file  Social Connections: Not on file  Intimate Partner Violence: Not on file    FAMILY HISTORY: Family History  Problem Relation Age of Onset   Diabetes Father    Alzheimer's disease Father    Basal cell carcinoma Mother        Metastatic    ALLERGIES:  is allergic to vioxx [rofecoxib], codeine, doxycycline hyclate, hydrocodone, and latex.  MEDICATIONS:  Current Outpatient Medications  Medication Sig Dispense Refill   acetaminophen (TYLENOL) 500 MG tablet Take 1,000 mg by mouth every 6 (six) hours as needed for moderate pain.     acyclovir (ZOVIRAX) 400 MG tablet Take 1 tablet (400 mg total) by mouth 2 (two) times daily. 180 tablet 1   ALPRAZolam (XANAX) 0.25 MG tablet Take 0.25 mg by mouth 3 (three) times daily as needed.     amLODipine (NORVASC) 5 MG tablet Take 5 mg by mouth daily.     Ascorbic Acid (VITAMIN C) 1000 MG tablet Take 1,000 mg by mouth daily.     aspirin 81 MG tablet Take 81 mg by mouth daily.     cetirizine (ZYRTEC) 10 MG tablet Take 10 mg by mouth at bedtime.     Cholecalciferol (VITAMIN D3) 2000 UNITS capsule Take 2,000 Units by mouth daily.     dexamethasone (DECADRON) 4 MG tablet Take 40 mg (10 tablets) by mouth once a week. 40 tablet 0   dextromethorphan-guaiFENesin (TUSSIN DM) 10-100 MG/5ML liquid Take 10 mLs by mouth every 4 (four) hours as needed for cough.     fluticasone (FLONASE) 50 MCG/ACT nasal spray Place 2 sprays into the nose at bedtime.     glimepiride (AMARYL) 2 MG tablet Take 1-2 mg by mouth daily as needed (high blood sugar).     hydrOXYzine (ATARAX) 25 MG tablet Take 25 mg by mouth at bedtime.     KRILL OIL PO Take 1 tablet by mouth daily.     metoCLOPramide (REGLAN) 10 MG tablet Take 1 tablet (10 mg total) by mouth every 6 (six) hours. 60 tablet 0   Multiple Vitamin (MULTIVITAMIN WITH MINERALS) TABS Take 1 tablet by mouth daily.      prochlorperazine (COMPAZINE) 10 MG tablet Take 1 tablet (10 mg total) by mouth every 6 (six) hours as needed for nausea or vomiting. 30 tablet 0   simvastatin (ZOCOR) 20 MG tablet Take 20 mg by mouth every evening.     traMADol (ULTRAM) 50 MG tablet Take 1-2 tablets (50-100 mg total) by mouth every 4 (four) hours as needed. 90 tablet 0   No current facility-administered medications for this visit.    REVIEW OF SYSTEMS:   Constitutional: ( - ) fevers, ( - )  chills , ( - ) night sweats Eyes: ( - ) blurriness of vision, ( - )  double vision, ( - ) watery eyes Ears, nose, mouth, throat, and face: ( - ) mucositis, ( - ) sore throat Respiratory: ( - ) cough, ( - ) dyspnea, ( - ) wheezes Cardiovascular: ( - ) palpitation, ( - ) chest discomfort, ( - ) lower extremity swelling Gastrointestinal:  ( - ) nausea, ( - ) heartburn, ( - ) change in bowel habits Skin: ( - ) abnormal skin rashes Lymphatics: ( - ) new lymphadenopathy, ( - ) easy bruising Neurological: ( - ) numbness, ( - ) tingling, ( - ) new weaknesses Behavioral/Psych: ( - ) mood change, ( - ) new changes  All other systems were reviewed with the patient and are negative.  PHYSICAL EXAMINATION: ECOG PERFORMANCE STATUS: 1 - Symptomatic but completely ambulatory  Vitals:   12/21/22 1100  BP: (!) 123/54  Pulse: 87  Resp: 16  Temp: 98.5 F (36.9 C)  SpO2: 98%   There were no vitals filed for this visit.  GENERAL: Well-appearing elderly Caucasian female, alert, no distress and comfortable SKIN: skin color, texture, turgor are normal, no rashes or significant lesions EYES: conjunctiva are pink and non-injected, sclera clear LUNGS: clear to auscultation and percussion with normal breathing effort HEART: regular rate & rhythm and no murmurs and no lower extremity edema Musculoskeletal: no cyanosis of digits and no clubbing  PSYCH: alert & oriented x 3, fluent speech NEURO: no focal motor/sensory deficits  LABORATORY DATA:  I have  reviewed the data as listed    Latest Ref Rng & Units 12/21/2022   10:41 AM 12/06/2022    7:22 AM 11/21/2022   11:23 AM  CBC  WBC 4.0 - 10.5 K/uL 4.6  5.6  6.6   Hemoglobin 12.0 - 15.0 g/dL 16.1  09.6  04.5   Hematocrit 36.0 - 46.0 % 31.5  32.9  33.0   Platelets 150 - 400 K/uL 173  184  170        Latest Ref Rng & Units 12/21/2022   10:41 AM 11/21/2022   11:23 AM 11/14/2022   10:36 AM  CMP  Glucose 70 - 99 mg/dL 409  811  914   BUN 8 - 23 mg/dL 11  11  18    Creatinine 0.44 - 1.00 mg/dL 7.82  9.56  2.13   Sodium 135 - 145 mmol/L 142  142  144   Potassium 3.5 - 5.1 mmol/L 4.1  3.8  4.1   Chloride 98 - 111 mmol/L 106  106  106   CO2 22 - 32 mmol/L 27  23  32   Calcium 8.9 - 10.3 mg/dL 9.8  9.7  08.6   Total Protein 6.5 - 8.1 g/dL 6.8  6.6  7.0   Total Bilirubin 0.3 - 1.2 mg/dL 0.5  0.9  0.5   Alkaline Phos 38 - 126 U/L 90  74  85   AST 15 - 41 U/L 24  22  36   ALT 0 - 44 U/L 19  19  27      Lab Results  Component Value Date   MPROTEIN 0.1 (H) 11/14/2022   Lab Results  Component Value Date   KPAFRELGTCHN 4,793.0 (H) 11/14/2022   LAMBDASER 2.6 (L) 11/14/2022   KAPLAMBRATIO 735.45 (H) 11/29/2022   KAPLAMBRATIO 1,843.46 (H) 11/14/2022    RADIOGRAPHIC STUDIES: NM PET Image Initial (PI) Skull Base To Thigh  Result Date: 12/13/2022 CLINICAL DATA:  Initial treatment strategy for plasmacytoma. EXAM: NUCLEAR MEDICINE PET SKULL BASE TO THIGH  TECHNIQUE: 6.4 mCi F-18 FDG was injected intravenously. Full-ring PET imaging was performed from the skull base to thigh after the radiotracer. CT data was obtained and used for attenuation correction and anatomic localization. Fasting blood glucose: 155 mg/dl COMPARISON:  CT abdomen/pelvis dated 11/11/2022. MRI thoracic spine dated 09/22/2022. FINDINGS: Mediastinal blood pool activity: SUV max 2.0 Liver activity: SUV max NA NECK: No hypermetabolic cervical lymphadenopathy. Incidental CT findings: None. CHEST: No hypermetabolic pulmonary nodules. No  hypermetabolic thoracic lymphadenopathy. Incidental CT findings: Scarring/atelectasis in the bilateral lower lobes. Atherosclerotic calcifications of the aortic arch. Mild coronary atherosclerosis of the LAD. ABDOMEN/PELVIS: No abnormal hypermetabolism in the liver, spleen, pancreas, or adrenal glands. No hypermetabolic abdominopelvic lymphadenopathy. Incidental CT findings: Cholelithiasis. Atherosclerotic calcifications the abdominal aorta and branch vessels. Status post left hemicolectomy with suture line in the lower pelvis. SKELETON: Diffuse hypermetabolism involving the visualized axial and appendicular skeleton, possibly reactive. No focal hypermetabolic activity to suggest skeletal metastasis or active myeloma. Incidental CT findings: Prior multilevel vertebral augmentation of the mid/lower thoracic spine. IMPRESSION: No findings to suggest active myeloma. Electronically Signed   By: Charline Bills M.D.   On: 12/13/2022 00:08   CT BONE MARROW BIOPSY & ASPIRATION  Result Date: 12/06/2022 INDICATION: 82 year old female with history of plasmacytoma. EXAM: CT-GUIDED BONE MARROW BIOPSY AND ASPIRATION MEDICATIONS: None ANESTHESIA/SEDATION: Fentanyl 100 mcg IV; Versed 0 mg IV Sedation Time: 0 minutes; The patient was continuously monitored during the procedure by the interventional radiology nurse under my direct supervision. COMPLICATIONS: None immediate. PROCEDURE: Informed consent was obtained from the patient following an explanation of the procedure, risks, benefits and alternatives. The patient understands, agrees and consents for the procedure. All questions were addressed. A time out was performed prior to the initiation of the procedure. The patient was positioned prone and non-contrast localization CT was performed of the pelvis to demonstrate the iliac marrow spaces. The operative site was prepped and draped in the usual sterile fashion. Under sterile conditions and local anesthesia, a 22 gauge  spinal needle was utilized for procedural planning. Next, an 11 gauge coaxial bone biopsy needle was advanced into the right iliac marrow space. Needle position was confirmed with CT imaging. Initially, a bone marrow aspiration was performed. Next, a bone marrow biopsy was obtained with the 11 gauge outer bone marrow device. Samples were prepared with the cytotechnologist and deemed adequate. The needle was removed and superficial hemostasis was obtained with manual compression. A dressing was applied. The patient tolerated the procedure well without immediate post procedural complication. IMPRESSION: Successful CT guided right iliac bone marrow aspiration and core biopsy. Marliss Coots, MD [facility] Radiology Electronically Signed   By: Marliss Coots M.D.   On: 12/06/2022 11:03    ASSESSMENT & PLAN Alice Anthony 82 y.o. female with medical history significant for newly diagnosed IgG kappa multiple myeloma who presents for a follow up visit.   # IgG Kappa Multiple Myeloma # Plasmacytoma of Spine  -- Patient diagnosed with plasmacytoma of the spine, subsequent bone marrow biopsy confirmed an IgG kappa multiple myeloma involving the bone marrow --start of Velcade/Dex Cycle 1 Day 1 on 12/24/2022. Plan: -- Labs today show creatinine 0.72, normal LFTs.  White blood cell 4.6, hemoglobin 10.1, MCV 101.9, and platelets of 173 -- Okay to proceed with treatment.  Cycle 1 day 1 on 12/24/2022 --Can consider the addition of Revlimid to the patient's treatment regimen at a later time. -- Return to clinic in 2 weeks with interval  1 week treatments.  #Supportive Care -- chemotherapy education today  -- port placement not required   -- zofran 8mg  q8H PRN and compazine 10mg  PO q6H for nausea -- acyclovir 400mg  PO BID for VCZ prophylaxis -- Awaiting dental clearance prior to the start of Xgeva/Zometa -- Tramadol 50 mg every 6 hours as needed for pain  control   Orders Placed This Encounter  Procedures   Ambulatory referral to Social Work    Referral Priority:   Routine    Referral Type:   Consultation    Referral Reason:   Specialty Services Required    Number of Visits Requested:   1    All questions were answered. The patient knows to call the clinic with any problems, questions or concerns.  A total of more than 30 minutes were spent on this encounter with face-to-face time and non-face-to-face time, including preparing to see the patient, ordering tests and/or medications, counseling the patient and coordination of care as outlined above.   Ulysees Barns, MD Department of Hematology/Oncology [facility] Phone: [phone] Pager: [phone] Email: [email or web address]  12/30/2022 6:38 PM

## 2022-12-21 NOTE — Progress Notes (Signed)
[facility] Spiritual Care Note  Met Alice Anthony and her daughter Alice Anthony while they were waiting for her appointment with Dr Leonides Schanz, per referral from nursing for additional layer of spiritual and emotional support. Brought a handmade blessing blanket as a tangible sign of support and encouragement, as well as a packet of [facility] and [employer] support programming information. Invited her to participate in Blood Cancer Support Group, which is currently meeting virtually.  Alice Anthony reports that because of pain she can no longer do the things that previously brought her joy and meaning, such as decorating her church and participating in worship and fellowship opportunities. So she is dealing with the combination of pain, grief, and loss of support/meaning-making activities.   Provided reflective listening and spiritual/emotional support. We plan to follow up in infusion on Monday to continue the conversation.   [address] [place], [place], [facility] Pager [phone] Voicemail [phone]

## 2022-12-21 NOTE — Progress Notes (Unsigned)
Palliative Medicine [facility]  [phone] [phone]   Name: Alice Anthony Date: 12/21/2022 MRN: [id number]  DOB: 23-Apr-1941  Patient Care Team: [name], MD as PCP - General (Family Medicine)    REASON FOR CONSULTATION: Alice Anthony is a 82 y.o. female with oncologic medical history including recent diagnosis of multiple myeloma (12/2022), DM, and posterior vitreous detachment of left and right eyes. Palliative ask to see for symptom management and goals of care.    SOCIAL HISTORY:     reports that she has never smoked. She has never used smokeless tobacco. She reports that she does not drink alcohol and does not use drugs.  ADVANCE DIRECTIVES:  Advanced directives on file   CODE STATUS: Full code  PAST MEDICAL HISTORY: Past Medical History:  Diagnosis Date   Anxiety    Arthritis    Asthma    Hx: of   Cancer (HCC)    Hx: of skin cancer   Diverticulosis    GERD (gastroesophageal reflux disease)    Hx: of   History of kidney stones    Hyperlipemia    Hypertension    Pre-diabetes     PAST SURGICAL HISTORY:  Past Surgical History:  Procedure Laterality Date   ABDOMINAL SURGERY  09/19/2012   Ruptured Diverticuli, Ostomy Placed.   BREAST BIOPSY  06/06/2012   breast biopsy   CATARACT EXTRACTION W/ INTRAOCULAR LENS IMPLANT Bilateral    COLONOSCOPY  03/06/2012   COLOSTOMY CLOSURE  12/25/2012   Dr Luisa Hart   COLOSTOMY CLOSURE N/A 12/25/2012   Procedure: COLOSTOMY CLOSURE;  Surgeon: Clovis Pu. Cornett, MD;  Location: [facility] OR;  Service: General;  Laterality: N/A;   EXTRACORPOREAL SHOCK WAVE LITHOTRIPSY Left 11/17/2018   Procedure: EXTRACORPOREAL SHOCK WAVE LITHOTRIPSY (ESWL);  Surgeon: Malen Gauze, MD;  Location: [facility] ORS;  Service: Urology;  Laterality: Left;   EYE SURGERY  10/19/1998   Hx: of cyst removed from left eye   KYPHOPLASTY N/A 10/10/2022   Procedure: KYPHOPLASTY Thoracic seven , Thoracic ten,Thoracic  eleven;  Surgeon: Tressie Stalker, MD;  Location: [facility] OR;  Service: Neurosurgery;  Laterality: N/A;   LAPAROTOMY N/A 09/19/2012   Procedure: EXPLORATORY LAPAROTOMY, sigmoid colectomy;  Surgeon: Clovis Pu. Cornett, MD;  Location: [facility] ORS;  Service: General;  Laterality: N/A;   SIGMOIDOSCOPY  01/11/1999   TONSILLECTOMY     TRIGGER FINGER RELEASE  12/25/2010   TUBAL LIGATION  10/16/1985    HEMATOLOGY/ONCOLOGY HISTORY:  Oncology History  Multiple myeloma (HCC)  12/19/2022 Initial Diagnosis   Multiple myeloma (HCC)   12/21/2022 -  Chemotherapy   Patient is on Treatment Plan : MYELOMA NON-TRANSPLANT CANDIDATES Vd weekly q21d       ALLERGIES:  is allergic to vioxx [rofecoxib], codeine, doxycycline hyclate, hydrocodone, and latex.  MEDICATIONS:  Current Outpatient Medications  Medication Sig Dispense Refill   acetaminophen (TYLENOL) 500 MG tablet Take 1,000 mg by mouth every 6 (six) hours as needed for moderate pain.     acyclovir (ZOVIRAX) 400 MG tablet Take 1 tablet (400 mg total) by mouth 2 (two) times daily. 180 tablet 1   ALPRAZolam (XANAX) 0.25 MG tablet Take 0.25 mg by mouth 3 (three) times daily as needed.     amLODipine (NORVASC) 5 MG tablet Take 5 mg by mouth daily.     Ascorbic Acid (VITAMIN C) 1000 MG tablet Take 1,000 mg by mouth daily.     aspirin 81 MG tablet Take 81 mg by  mouth daily.     calcium carbonate (TUMS - DOSED IN MG ELEMENTAL CALCIUM) 500 MG chewable tablet Chew 1 tablet by mouth 2 (two) times daily as needed for indigestion.     cetirizine (ZYRTEC) 10 MG tablet Take 10 mg by mouth at bedtime.     Cholecalciferol (VITAMIN D3) 2000 UNITS capsule Take 2,000 Units by mouth daily.     dexamethasone (DECADRON) 4 MG tablet Take 40 mg (10 tablets) by mouth once a week. 40 tablet 0   dextromethorphan-guaiFENesin (TUSSIN DM) 10-100 MG/5ML liquid Take 10 mLs by mouth every 4 (four) hours as needed for cough.     docusate sodium (COLACE) 50 MG capsule Take 50 mg by mouth daily.      fluticasone (FLONASE) 50 MCG/ACT nasal spray Place 2 sprays into the nose at bedtime.     glimepiride (AMARYL) 2 MG tablet Take 1-2 mg by mouth daily as needed (high blood sugar).     hydrOXYzine (ATARAX) 25 MG tablet Take 25 mg by mouth at bedtime.     KRILL OIL PO Take 1 tablet by mouth daily.     metoCLOPramide (REGLAN) 10 MG tablet Take 1 tablet (10 mg total) by mouth every 6 (six) hours. 30 tablet 0   Multiple Vitamin (MULTIVITAMIN WITH MINERALS) TABS Take 1 tablet by mouth daily.     ondansetron (ZOFRAN) 8 MG tablet Take 1 tablet (8 mg total) by mouth every 8 (eight) hours as needed. 30 tablet 0   polyethylene glycol powder (MIRALAX) 17 GM/SCOOP powder Take 1 Container by mouth daily.     prochlorperazine (COMPAZINE) 10 MG tablet Take 1 tablet (10 mg total) by mouth every 6 (six) hours as needed for nausea or vomiting. 30 tablet 0   simvastatin (ZOCOR) 20 MG tablet Take 20 mg by mouth every evening.     traMADol (ULTRAM) 50 MG tablet Take 50-100 mg by mouth every 6 (six) hours as needed.     No current facility-administered medications for this visit.    VITAL SIGNS: There were no vitals taken for this visit. There were no vitals filed for this visit.  Estimated body mass index is 24.19 kg/m as calculated from the following:   Height as of 12/06/22: 5\' 1"  (1.549 m).   Weight as of 12/06/22: 128 lb (58.1 kg).  LABS: CBC:    Component Value Date/Time   WBC 4.6 12/21/2022 1041   WBC 5.6 12/06/2022 0722   HGB 11.1 (L) 12/21/2022 1041   HCT 31.5 (L) 12/21/2022 1041   PLT 173 12/21/2022 1041   MCV 101.9 (H) 12/21/2022 1041   NEUTROABS 2.1 12/21/2022 1041   LYMPHSABS 1.7 12/21/2022 1041   MONOABS 0.4 12/21/2022 1041   EOSABS 0.3 12/21/2022 1041   BASOSABS 0.1 12/21/2022 1041   Comprehensive Metabolic Panel:    Component Value Date/Time   NA 142 12/21/2022 1041   K 4.1 12/21/2022 1041   CL 106 12/21/2022 1041   CO2 27 12/21/2022 1041   BUN 11 12/21/2022 1041   CREATININE  0.72 12/21/2022 1041   GLUCOSE 118 (H) 12/21/2022 1041   CALCIUM 9.8 12/21/2022 1041   AST 24 12/21/2022 1041   ALT 19 12/21/2022 1041   ALKPHOS 90 12/21/2022 1041   BILITOT 0.5 12/21/2022 1041   PROT 6.8 12/21/2022 1041   ALBUMIN 4.7 12/21/2022 1041    RADIOGRAPHIC STUDIES: NM PET Image Initial (PI) Skull Base To Thigh  Result Date: 12/13/2022 CLINICAL DATA:  Initial treatment strategy for plasmacytoma.  EXAM: NUCLEAR MEDICINE PET SKULL BASE TO THIGH TECHNIQUE: 6.4 mCi F-18 FDG was injected intravenously. Full-ring PET imaging was performed from the skull base to thigh after the radiotracer. CT data was obtained and used for attenuation correction and anatomic localization. Fasting blood glucose: 155 mg/dl COMPARISON:  CT abdomen/pelvis dated 11/11/2022. MRI thoracic spine dated 09/22/2022. FINDINGS: Mediastinal blood pool activity: SUV max 2.0 Liver activity: SUV max NA NECK: No hypermetabolic cervical lymphadenopathy. Incidental CT findings: None. CHEST: No hypermetabolic pulmonary nodules. No hypermetabolic thoracic lymphadenopathy. Incidental CT findings: Scarring/atelectasis in the bilateral lower lobes. Atherosclerotic calcifications of the aortic arch. Mild coronary atherosclerosis of the LAD. ABDOMEN/PELVIS: No abnormal hypermetabolism in the liver, spleen, pancreas, or adrenal glands. No hypermetabolic abdominopelvic lymphadenopathy. Incidental CT findings: Cholelithiasis. Atherosclerotic calcifications the abdominal aorta and branch vessels. Status post left hemicolectomy with suture line in the lower pelvis. SKELETON: Diffuse hypermetabolism involving the visualized axial and appendicular skeleton, possibly reactive. No focal hypermetabolic activity to suggest skeletal metastasis or active myeloma. Incidental CT findings: Prior multilevel vertebral augmentation of the mid/lower thoracic spine. IMPRESSION: No findings to suggest active myeloma. Electronically Signed   By: Charline Bills  M.D.   On: 12/13/2022 00:08   PERFORMANCE STATUS (ECOG) : {CHL ONC ECOG OZ:[id number]}  Review of Systems Unless otherwise noted, a complete review of systems is negative.  Physical Exam General: NAD Cardiovascular: regular rate and rhythm Pulmonary: clear ant fields Abdomen: soft, nontender, + bowel sounds Extremities: no edema, no joint deformities Skin: no rashes Neurological:  IMPRESSION: *** I introduced myself, Maygan RN, and Palliative's role in collaboration with the oncology team. Concept of Palliative Care was introduced as specialized medical care for people and their families living with serious illness.  It focuses on providing relief from the symptoms and stress of a serious illness.  The goal is to improve quality of life for both the patient and the family. Values and goals of care important to patient and family were attempted to be elicited.    We discussed *** current illness and what it means in the larger context of *** on-going co-morbidities. Natural disease trajectory and expectations were discussed.  I discussed the importance of continued conversation with family and their medical providers regarding overall plan of care and treatment options, ensuring decisions are within the context of the patients values and GOCs.  PLAN: Established therapeutic relationship. Education provided on palliative's role in collaboration with their Oncology/Radiation team. I will plan to see patient back in 2-4 weeks in collaboration to other oncology appointments.    Patient expressed understanding and was in agreement with this plan. She also understands that She can call the clinic at any time with any questions, concerns, or complaints.   Thank you for your referral and allowing Palliative to assist in Alice Anthony's care.   Number and complexity of problems addressed: ***HIGH - 1 or more chronic illnesses with SEVERE exacerbation, progression, or side effects of  treatment - advanced cancer, pain. Any controlled substances utilized were prescribed in the context of palliative care.   Visit consisted of counseling and education dealing with the complex and emotionally intense issues of symptom management and palliative care in the setting of serious and potentially life-threatening illness.Greater than 50%  of this time was spent counseling and coordinating care related to the above assessment and plan.  Signed by: Willette Alma, AGPCNP-BC Palliative Medicine Team/[facility]   *Please note that this is a verbal dictation therefore any spelling  or grammatical errors are due to the "Dragon Medical One" system interpretation.

## 2022-12-22 DIAGNOSIS — K5790 Diverticulosis of intestine, part unspecified, without perforation or abscess without bleeding: Secondary | ICD-10-CM | POA: Diagnosis not present

## 2022-12-22 DIAGNOSIS — M1990 Unspecified osteoarthritis, unspecified site: Secondary | ICD-10-CM | POA: Diagnosis not present

## 2022-12-22 DIAGNOSIS — K219 Gastro-esophageal reflux disease without esophagitis: Secondary | ICD-10-CM | POA: Diagnosis not present

## 2022-12-22 DIAGNOSIS — C9030 Solitary plasmacytoma not having achieved remission: Secondary | ICD-10-CM | POA: Diagnosis not present

## 2022-12-22 DIAGNOSIS — M8458XD Pathological fracture in neoplastic disease, other specified site, subsequent encounter for fracture with routine healing: Secondary | ICD-10-CM | POA: Diagnosis not present

## 2022-12-22 DIAGNOSIS — I10 Essential (primary) hypertension: Secondary | ICD-10-CM | POA: Diagnosis not present

## 2022-12-22 DIAGNOSIS — Z7984 Long term (current) use of oral hypoglycemic drugs: Secondary | ICD-10-CM | POA: Diagnosis not present

## 2022-12-22 DIAGNOSIS — E785 Hyperlipidemia, unspecified: Secondary | ICD-10-CM | POA: Diagnosis not present

## 2022-12-22 DIAGNOSIS — K5641 Fecal impaction: Secondary | ICD-10-CM | POA: Diagnosis not present

## 2022-12-22 DIAGNOSIS — Z87442 Personal history of urinary calculi: Secondary | ICD-10-CM | POA: Diagnosis not present

## 2022-12-22 DIAGNOSIS — Z79899 Other long term (current) drug therapy: Secondary | ICD-10-CM | POA: Diagnosis not present

## 2022-12-22 DIAGNOSIS — K5903 Drug induced constipation: Secondary | ICD-10-CM | POA: Diagnosis not present

## 2022-12-22 DIAGNOSIS — Z85828 Personal history of other malignant neoplasm of skin: Secondary | ICD-10-CM | POA: Diagnosis not present

## 2022-12-22 DIAGNOSIS — Z79891 Long term (current) use of opiate analgesic: Secondary | ICD-10-CM | POA: Diagnosis not present

## 2022-12-22 DIAGNOSIS — R7303 Prediabetes: Secondary | ICD-10-CM | POA: Diagnosis not present

## 2022-12-22 DIAGNOSIS — Z7982 Long term (current) use of aspirin: Secondary | ICD-10-CM | POA: Diagnosis not present

## 2022-12-22 DIAGNOSIS — J45909 Unspecified asthma, uncomplicated: Secondary | ICD-10-CM | POA: Diagnosis not present

## 2022-12-22 DIAGNOSIS — F419 Anxiety disorder, unspecified: Secondary | ICD-10-CM | POA: Diagnosis not present

## 2022-12-24 ENCOUNTER — Inpatient Hospital Stay: Payer: Medicare Other

## 2022-12-24 ENCOUNTER — Encounter: Payer: Self-pay | Admitting: General Practice

## 2022-12-24 ENCOUNTER — Inpatient Hospital Stay (HOSPITAL_BASED_OUTPATIENT_CLINIC_OR_DEPARTMENT_OTHER): Payer: Medicare Other | Admitting: Nurse Practitioner

## 2022-12-24 ENCOUNTER — Encounter: Payer: Self-pay | Admitting: Nurse Practitioner

## 2022-12-24 VITALS — BP 122/55 | HR 116 | Temp 98.9°F | Resp 16 | Ht 61.0 in | Wt 118.0 lb

## 2022-12-24 VITALS — BP 118/53 | HR 108 | Temp 98.6°F | Resp 16

## 2022-12-24 DIAGNOSIS — G893 Neoplasm related pain (acute) (chronic): Secondary | ICD-10-CM

## 2022-12-24 DIAGNOSIS — C9000 Multiple myeloma not having achieved remission: Secondary | ICD-10-CM

## 2022-12-24 DIAGNOSIS — Z7189 Other specified counseling: Secondary | ICD-10-CM | POA: Diagnosis not present

## 2022-12-24 DIAGNOSIS — Z515 Encounter for palliative care: Secondary | ICD-10-CM | POA: Diagnosis not present

## 2022-12-24 DIAGNOSIS — Z79899 Other long term (current) drug therapy: Secondary | ICD-10-CM | POA: Diagnosis not present

## 2022-12-24 DIAGNOSIS — R110 Nausea: Secondary | ICD-10-CM | POA: Diagnosis not present

## 2022-12-24 DIAGNOSIS — M792 Neuralgia and neuritis, unspecified: Secondary | ICD-10-CM

## 2022-12-24 DIAGNOSIS — R530 Neoplastic (malignant) related fatigue: Secondary | ICD-10-CM | POA: Diagnosis not present

## 2022-12-24 DIAGNOSIS — Z5112 Encounter for antineoplastic immunotherapy: Secondary | ICD-10-CM | POA: Diagnosis not present

## 2022-12-24 MED ORDER — DEXAMETHASONE 4 MG PO TABS
40.0000 mg | ORAL_TABLET | Freq: Once | ORAL | Status: AC
  Administered 2022-12-24: 40 mg via ORAL
  Filled 2022-12-24: qty 10

## 2022-12-24 MED ORDER — TRAMADOL HCL 50 MG PO TABS
50.0000 mg | ORAL_TABLET | ORAL | 0 refills | Status: DC | PRN
Start: 2022-12-24 — End: 2023-01-23

## 2022-12-24 MED ORDER — BORTEZOMIB CHEMO SQ INJECTION 3.5 MG (2.5MG/ML)
1.3000 mg/m2 | Freq: Once | INTRAMUSCULAR | Status: AC
  Administered 2022-12-24: 2 mg via SUBCUTANEOUS
  Filled 2022-12-24: qty 0.8

## 2022-12-24 MED ORDER — TRAMADOL HCL 50 MG PO TABS
100.0000 mg | ORAL_TABLET | Freq: Once | ORAL | Status: AC
  Administered 2022-12-24: 100 mg via ORAL
  Filled 2022-12-24: qty 2

## 2022-12-24 MED ORDER — METOCLOPRAMIDE HCL 10 MG PO TABS
10.0000 mg | ORAL_TABLET | Freq: Four times a day (QID) | ORAL | 0 refills | Status: DC
Start: 2022-12-28 — End: 2023-04-22

## 2022-12-24 NOTE — Patient Instructions (Signed)
[facility]  Discharge Instructions: Thank you for choosing [facility] to provide your oncology and hematology care.   If you have a lab appointment with the Cancer Center, please go directly to the Cancer Center and check in at the registration area.   Wear comfortable clothing and clothing appropriate for easy access to any Portacath or PICC line.   We strive to give you quality time with your provider. You may need to reschedule your appointment if you arrive late (15 or more minutes).  Arriving late affects you and other patients whose appointments are after yours.  Also, if you miss three or more appointments without notifying the office, you may be dismissed from the clinic at the provider's discretion.      For prescription refill requests, have your pharmacy contact our office and allow 72 hours for refills to be completed.    Today you received the following chemotherapy and/or immunotherapy agents Bortezomib (Velcade)    To help prevent nausea and vomiting after your treatment, we encourage you to take your nausea medication as directed.  BELOW ARE SYMPTOMS THAT SHOULD BE REPORTED IMMEDIATELY: *FEVER GREATER THAN 100.4 F (38 C) OR HIGHER *CHILLS OR SWEATING *NAUSEA AND VOMITING THAT IS NOT CONTROLLED WITH YOUR NAUSEA MEDICATION *UNUSUAL SHORTNESS OF BREATH *UNUSUAL BRUISING OR BLEEDING *URINARY PROBLEMS (pain or burning when urinating, or frequent urination) *BOWEL PROBLEMS (unusual diarrhea, constipation, pain near the anus) TENDERNESS IN MOUTH AND THROAT WITH OR WITHOUT PRESENCE OF ULCERS (sore throat, sores in mouth, or a toothache) UNUSUAL RASH, SWELLING OR PAIN  UNUSUAL VAGINAL DISCHARGE OR ITCHING   Items with * indicate a potential emergency and should be followed up as soon as possible or go to the Emergency Department if any problems should occur.  Please show the CHEMOTHERAPY ALERT CARD or IMMUNOTHERAPY ALERT CARD  at check-in to the Emergency Department and triage nurse.  Should you have questions after your visit or need to cancel or reschedule your appointment, please contact [facility]  Dept: [phone]  and follow the prompts.  Office hours are 8:00 a.m. to 4:30 p.m. Monday - Friday. Please note that voicemails left after 4:00 p.m. may not be returned until the following business day.  We are closed weekends and major holidays. You have access to a nurse at all times for urgent questions. Please call the main number to the clinic Dept: [phone] and follow the prompts.   For any non-urgent questions, you may also contact your provider using MyChart. We now offer e-Visits for anyone 18 and older to request care online for non-urgent symptoms. For details visit [email or web address].   Also download the MyChart app! Go to the app store, search "MyChart", open the app, select [place], and log in with your MyChart username and password.  Bortezomib Injection What is this medication? BORTEZOMIB (bor TEZ oh mib) treats lymphoma. It may also be used to treat multiple myeloma, a type of bone marrow cancer. It works by blocking a protein that causes cancer cells to grow and multiply. This helps to slow or stop the spread of cancer cells. This medicine may be used for other purposes; ask your health care provider or pharmacist if you have questions. COMMON BRAND NAME(S): Velcade What should I tell my care team before I take this medication? They need to know if you have any of these conditions: Dehydration Diabetes Heart disease Liver disease Tingling of   the fingers or toes or other nerve disorder An unusual or allergic reaction to bortezomib, other medications, foods, dyes, or preservatives If you or your partner are pregnant or trying to get pregnant Breastfeeding How should I use this medication? This medication is injected into a vein or under the skin.  It is given by your care team in a hospital or clinic setting. Talk to your care team about the use of this medication in children. Special care may be needed. Overdosage: If you think you have taken too much of this medicine contact a poison control center or emergency room at once. NOTE: This medicine is only for you. Do not share this medicine with others. What if I miss a dose? Keep appointments for follow-up doses. It is important not to miss your dose. Call your care team if you are unable to keep an appointment. What may interact with this medication? Ketoconazole Rifampin This list may not describe all possible interactions. Give your health care provider a list of all the medicines, herbs, non-prescription drugs, or dietary supplements you use. Also tell them if you smoke, drink alcohol, or use illegal drugs. Some items may interact with your medicine. What should I watch for while using this medication? Your condition will be monitored carefully while you are receiving this medication. You may need blood work while taking this medication. This medication may affect your coordination, reaction time, or judgment. Do not drive or operate machinery until you know how this medication affects you. Sit up or stand slowly to reduce the risk of dizzy or fainting spells. Drinking alcohol with this medication can increase the risk of these side effects. This medication may increase your risk of getting an infection. Call your care team for advice if you get a fever, chills, sore throat, or other symptoms of a cold or flu. Do not treat yourself. Try to avoid being around people who are sick. Check with your care team if you have severe diarrhea, nausea, and vomiting, or if you sweat a lot. The loss of too much body fluid may make it dangerous for you to take this medication. Talk to your care team if you may be pregnant. Serious birth defects can occur if you take this medication during pregnancy and  for 7 months after the last dose. You will need a negative pregnancy test before starting this medication. Contraception is recommended while taking this medication and for 7 months after the last dose. Your care team can help you find the option that works for you. If your partner can get pregnant, use a condom during sex while taking this medication and for 4 months after the last dose. Do not breastfeed while taking this medication and for 2 months after the last dose. This medication may cause infertility. Talk to your care team if you are concerned about your fertility. What side effects may I notice from receiving this medication? Side effects that you should report to your care team as soon as possible: Allergic reactions--skin rash, itching, hives, swelling of the face, lips, tongue, or throat Bleeding--bloody or black, tar-like stools, vomiting blood or brown material that looks like coffee grounds, red or dark brown urine, small red or purple spots on skin, unusual bruising or bleeding Bleeding in the brain--severe headache, stiff neck, confusion, dizziness, change in vision, numbness or weakness of the face, arm, or leg, trouble speaking, trouble walking, vomiting Bowel blockage--stomach cramping, unable to have a bowel movement or pass gas, loss of appetite,   vomiting Heart failure--shortness of breath, swelling of the ankles, feet, or hands, sudden weight gain, unusual weakness or fatigue Infection--fever, chills, cough, sore throat, wounds that don't heal, pain or trouble when passing urine, general feeling of discomfort or being unwell Liver injury--right upper belly pain, loss of appetite, nausea, light-colored stool, dark yellow or brown urine, yellowing skin or eyes, unusual weakness or fatigue Low blood pressure--dizziness, feeling faint or lightheaded, blurry vision Lung injury--shortness of breath or trouble breathing, cough, spitting up blood, chest pain, fever Pain, tingling, or  numbness in the hands or feet Severe or prolonged diarrhea Stomach pain, bloody diarrhea, pale skin, unusual weakness or fatigue, decrease in the amount of urine, which may be signs of hemolytic uremic syndrome Sudden and severe headache, confusion, change in vision, seizures, which may be signs of posterior reversible encephalopathy syndrome (PRES) TTP--purple spots on the skin or inside the mouth, pale skin, yellowing skin or eyes, unusual weakness or fatigue, fever, fast or irregular heartbeat, confusion, change in vision, trouble speaking, trouble walking Tumor lysis syndrome (TLS)--nausea, vomiting, diarrhea, decrease in the amount of urine, dark urine, unusual weakness or fatigue, confusion, muscle pain or cramps, fast or irregular heartbeat, joint pain Side effects that usually do not require medical attention (report to your care team if they continue or are bothersome): Constipation Diarrhea Fatigue Loss of appetite Nausea This list may not describe all possible side effects. Call your doctor for medical advice about side effects. You may report side effects to FDA at 1-800-FDA-1088. Where should I keep my medication? This medication is given in a hospital or clinic. It will not be stored at home. NOTE: This sheet is a summary. It may not cover all possible information. If you have questions about this medicine, talk to your doctor, pharmacist, or health care provider.  2023 Elsevier/Gold Standard (2021-12-20 00:00:00)    

## 2022-12-24 NOTE — Progress Notes (Signed)
[facility] Spiritual Care Note  Followed up with Alice Anthony and her daughter Alice Anthony in infusion. Alice Anthony is struggling with pain and having trouble getting sustainably comfortable in her chair. She reports feelings of nervousness about her first treatment. Faith and family are very important to her.  Family welcomes Spiritual Care. Provided pastoral presence, empathic listening, and normalization of feelings. Addressed concerns that she would value having held in prayer. Plan to follow up at her next infusion, and Alice Anthony has direct Spiritual Care number in case needs arise in the meantime.   [address] [place], [place], [facility] Pager [phone] Voicemail [phone]

## 2022-12-24 NOTE — Patient Instructions (Addendum)
-   continue your sennakot-s for constipation, you can take up to 4 tabs every 24 hours, 2 in the morning and 2 in the night, if you develop loose stools back down to 2 pills at night.  - continue your Reglan and add in your compazine for your nausea - continue your tramadol take 2 pills every 4 hours as needed, prescription will be sent to the pharmacy to pick up - do not pick up gabapentin but it will be at your pharmacy should you need it - continue small frequent meals and focus on protein - we will see you next week on Tuesday 01-01-23 and check in and see how things are going if you need [place] sooner or have any questions please call at [phone]

## 2022-12-24 NOTE — Progress Notes (Signed)
Dr. Leonides Schanz not here today. Communicated with Dr. Candise Che- ok to treat today with HR of 116.  Patient blood sugars are hig onher steroids- [employer] PA ordered a check. Blood sugar is 285 and patient told to take glimiperide dose when she gets home and only drink half of her protein shake per PA.

## 2022-12-25 ENCOUNTER — Encounter: Payer: Self-pay | Admitting: Hematology and Oncology

## 2022-12-25 DIAGNOSIS — Z79899 Other long term (current) drug therapy: Secondary | ICD-10-CM | POA: Diagnosis not present

## 2022-12-25 DIAGNOSIS — C9030 Solitary plasmacytoma not having achieved remission: Secondary | ICD-10-CM | POA: Diagnosis not present

## 2022-12-25 DIAGNOSIS — K5641 Fecal impaction: Secondary | ICD-10-CM | POA: Diagnosis not present

## 2022-12-25 DIAGNOSIS — M1990 Unspecified osteoarthritis, unspecified site: Secondary | ICD-10-CM | POA: Diagnosis not present

## 2022-12-25 DIAGNOSIS — F419 Anxiety disorder, unspecified: Secondary | ICD-10-CM | POA: Diagnosis not present

## 2022-12-25 DIAGNOSIS — M8458XD Pathological fracture in neoplastic disease, other specified site, subsequent encounter for fracture with routine healing: Secondary | ICD-10-CM | POA: Diagnosis not present

## 2022-12-29 DIAGNOSIS — M8458XD Pathological fracture in neoplastic disease, other specified site, subsequent encounter for fracture with routine healing: Secondary | ICD-10-CM | POA: Diagnosis not present

## 2022-12-29 DIAGNOSIS — C9030 Solitary plasmacytoma not having achieved remission: Secondary | ICD-10-CM | POA: Diagnosis not present

## 2022-12-29 DIAGNOSIS — K5641 Fecal impaction: Secondary | ICD-10-CM | POA: Diagnosis not present

## 2022-12-29 DIAGNOSIS — F419 Anxiety disorder, unspecified: Secondary | ICD-10-CM | POA: Diagnosis not present

## 2022-12-29 DIAGNOSIS — Z79899 Other long term (current) drug therapy: Secondary | ICD-10-CM | POA: Diagnosis not present

## 2022-12-29 DIAGNOSIS — M1990 Unspecified osteoarthritis, unspecified site: Secondary | ICD-10-CM | POA: Diagnosis not present

## 2022-12-30 ENCOUNTER — Encounter: Payer: Self-pay | Admitting: Hematology and Oncology

## 2022-12-31 NOTE — Progress Notes (Signed)
Palliative Medicine [facility]  [phone] [phone]   Name: Alice Anthony Date: 12/31/2022 MRN: [id number]  DOB: 10-03-1940  Patient Care Team: [name], MD as PCP - General (Family Medicine) Pickenpack-Cousar, [name], NP as Nurse Practitioner (Nurse Practitioner)    INTERVAL HISTORY: Alice Anthony is a 82 y.o. female with oncologic medical history including recent diagnosis of multiple myeloma (12/2022), DM, and posterior vitreous detachment of left and right eyes. Palliative ask to see for symptom management and goals of care   SOCIAL HISTORY:     reports that she has never smoked. She has never used smokeless tobacco. She reports that she does not drink alcohol and does not use drugs.  ADVANCE DIRECTIVES:  Advanced directives on file  CODE STATUS: Full code  PAST MEDICAL HISTORY: Past Medical History:  Diagnosis Date   Anxiety    Arthritis    Asthma    Hx: of   Cancer (HCC)    Hx: of skin cancer   Diverticulosis    GERD (gastroesophageal reflux disease)    Hx: of   History of kidney stones    Hyperlipemia    Hypertension    Pre-diabetes     ALLERGIES:  is allergic to vioxx [rofecoxib], codeine, doxycycline hyclate, hydrocodone, and latex.  MEDICATIONS:  Current Outpatient Medications  Medication Sig Dispense Refill   acetaminophen (TYLENOL) 500 MG tablet Take 1,000 mg by mouth every 6 (six) hours as needed for moderate pain.     acyclovir (ZOVIRAX) 400 MG tablet Take 1 tablet (400 mg total) by mouth 2 (two) times daily. 180 tablet 1   ALPRAZolam (XANAX) 0.25 MG tablet Take 0.25 mg by mouth 3 (three) times daily as needed.     amLODipine (NORVASC) 5 MG tablet Take 5 mg by mouth daily.     Ascorbic Acid (VITAMIN C) 1000 MG tablet Take 1,000 mg by mouth daily.     aspirin 81 MG tablet Take 81 mg by mouth daily.     cetirizine (ZYRTEC) 10 MG tablet Take 10 mg by mouth at bedtime.     Cholecalciferol (VITAMIN  D3) 2000 UNITS capsule Take 2,000 Units by mouth daily.     dexamethasone (DECADRON) 4 MG tablet Take 40 mg (10 tablets) by mouth once a week. 40 tablet 0   dextromethorphan-guaiFENesin (TUSSIN DM) 10-100 MG/5ML liquid Take 10 mLs by mouth every 4 (four) hours as needed for cough.     fluticasone (FLONASE) 50 MCG/ACT nasal spray Place 2 sprays into the nose at bedtime.     glimepiride (AMARYL) 2 MG tablet Take 1-2 mg by mouth daily as needed (high blood sugar).     hydrOXYzine (ATARAX) 25 MG tablet Take 25 mg by mouth at bedtime.     KRILL OIL PO Take 1 tablet by mouth daily.     metoCLOPramide (REGLAN) 10 MG tablet Take 1 tablet (10 mg total) by mouth every 6 (six) hours. 60 tablet 0   Multiple Vitamin (MULTIVITAMIN WITH MINERALS) TABS Take 1 tablet by mouth daily.     prochlorperazine (COMPAZINE) 10 MG tablet Take 1 tablet (10 mg total) by mouth every 6 (six) hours as needed for nausea or vomiting. 30 tablet 0   simvastatin (ZOCOR) 20 MG tablet Take 20 mg by mouth every evening.     traMADol (ULTRAM) 50 MG tablet Take 1-2 tablets (50-100 mg total) by mouth every 4 (four) hours as needed. 90 tablet 0  No current facility-administered medications for this visit.    VITAL SIGNS: There were no vitals taken for this visit. There were no vitals filed for this visit.  Estimated body mass index is 22.3 kg/m as calculated from the following:   Height as of 12/24/22: 5\' 1"  (1.549 m).   Weight as of 12/24/22: 118 lb (53.5 kg).   PERFORMANCE STATUS (ECOG) : 2 - Symptomatic, <50% confined to bed   Physical Exam General: NAD, sitting in recliner  Cardiovascular: regular rate and rhythm Pulmonary: normal breathing pattern  Extremities: no edema, no joint deformities Skin: no rashes Neurological: AAO x4  IMPRESSION: I saw Alice Anthony during her infusion. No acute distress. Daughter is present. Denies nausea, vomiting, or diarrhea. States she is doing well overall.   Neoplasm related pain Ms.  Radoncic reports pain is well controlled on current regimen. She is tolerating Tramadol 50-100mg .    We discussed current regimen at length. Some improvement. We discussed continuing current regimen without changes at this time.   Will continue close follow-up.    Nausea  Controlled. Has not required reglan. Has as needed medications on hand.   3. Constipation We discussed bowel regimen. She is currently taking Senna one in the morning and two at bedtime. Advised she could increase to 2 tablets twice daily.   4. Goals of Care  5/20- We discussed her current illness and what it means in the larger context of her on-going co-morbidities. Natural disease trajectory and expectations were discussed.   Patient and daughter are realistic in their understanding of current illness. Is remaining hopeful for stability and improve quality of life.   We discussed Her current illness and what it means in the larger context of Her on-going co-morbidities. Natural disease trajectory and expectations were discussed.  I discussed the importance of continued conversation with family and their medical providers regarding overall plan of care and treatment options, ensuring decisions are within the context of the patients values and GOCs.  PLAN:  Tramadol 50-100 mg every 4-6 hours as needed Senna-S 2 pills twice daily Compazine as needed Reglan every 6 hours Ongoing support and symptom management I will plan to see patient back in 3-4 weeks in collaboration to other oncology appointments.    Patient expressed understanding and was in agreement with this plan. She also understands that She can call the clinic at any time with any questions, concerns, or complaints.   Any controlled substances utilized were prescribed in the context of palliative care. PDMP has been reviewed.    Visit consisted of counseling and education dealing with the complex and emotionally intense issues of symptom management and  palliative care in the setting of serious and potentially life-threatening illness.Greater than 50%  of this time was spent counseling and coordinating care related to the above assessment and plan.  Alice Anthony, AGPCNP-BC  Palliative Medicine Team/ Cancer Center  *Please note that this is a verbal dictation therefore any spelling or grammatical errors are due to the "Dragon Medical One" system interpretation.

## 2023-01-01 ENCOUNTER — Encounter: Payer: Self-pay | Admitting: Nurse Practitioner

## 2023-01-01 ENCOUNTER — Encounter: Payer: Self-pay | Admitting: General Practice

## 2023-01-01 ENCOUNTER — Inpatient Hospital Stay (HOSPITAL_BASED_OUTPATIENT_CLINIC_OR_DEPARTMENT_OTHER): Payer: Medicare Other | Admitting: Nurse Practitioner

## 2023-01-01 ENCOUNTER — Inpatient Hospital Stay: Payer: Medicare Other

## 2023-01-01 ENCOUNTER — Inpatient Hospital Stay (HOSPITAL_BASED_OUTPATIENT_CLINIC_OR_DEPARTMENT_OTHER): Payer: Medicare Other | Admitting: Hematology and Oncology

## 2023-01-01 VITALS — BP 119/52 | HR 97 | Temp 98.2°F | Resp 18

## 2023-01-01 DIAGNOSIS — Z515 Encounter for palliative care: Secondary | ICD-10-CM | POA: Diagnosis not present

## 2023-01-01 DIAGNOSIS — C9000 Multiple myeloma not having achieved remission: Secondary | ICD-10-CM

## 2023-01-01 DIAGNOSIS — Z79899 Other long term (current) drug therapy: Secondary | ICD-10-CM | POA: Diagnosis not present

## 2023-01-01 DIAGNOSIS — K5903 Drug induced constipation: Secondary | ICD-10-CM | POA: Diagnosis not present

## 2023-01-01 DIAGNOSIS — R530 Neoplastic (malignant) related fatigue: Secondary | ICD-10-CM

## 2023-01-01 DIAGNOSIS — G893 Neoplasm related pain (acute) (chronic): Secondary | ICD-10-CM | POA: Diagnosis not present

## 2023-01-01 DIAGNOSIS — Z5112 Encounter for antineoplastic immunotherapy: Secondary | ICD-10-CM | POA: Diagnosis not present

## 2023-01-01 LAB — CBC WITH DIFFERENTIAL (CANCER CENTER ONLY)
Abs Immature Granulocytes: 0.01 10*3/uL (ref 0.00–0.07)
Basophils Absolute: 0 10*3/uL (ref 0.0–0.1)
Basophils Relative: 1 %
Eosinophils Absolute: 0.2 10*3/uL (ref 0.0–0.5)
Eosinophils Relative: 4 %
HCT: 29.8 % — ABNORMAL LOW (ref 36.0–46.0)
Hemoglobin: 10.5 g/dL — ABNORMAL LOW (ref 12.0–15.0)
Immature Granulocytes: 0 %
Lymphocytes Relative: 27 %
Lymphs Abs: 1.1 10*3/uL (ref 0.7–4.0)
MCH: 36.6 pg — ABNORMAL HIGH (ref 26.0–34.0)
MCHC: 35.2 g/dL (ref 30.0–36.0)
MCV: 103.8 fL — ABNORMAL HIGH (ref 80.0–100.0)
Monocytes Absolute: 0.3 10*3/uL (ref 0.1–1.0)
Monocytes Relative: 7 %
Neutro Abs: 2.5 10*3/uL (ref 1.7–7.7)
Neutrophils Relative %: 61 %
Platelet Count: 149 10*3/uL — ABNORMAL LOW (ref 150–400)
RBC: 2.87 MIL/uL — ABNORMAL LOW (ref 3.87–5.11)
RDW: 13.8 % (ref 11.5–15.5)
WBC Count: 4 10*3/uL (ref 4.0–10.5)
nRBC: 0 % (ref 0.0–0.2)

## 2023-01-01 LAB — CMP (CANCER CENTER ONLY)
ALT: 16 U/L (ref 0–44)
AST: 14 U/L — ABNORMAL LOW (ref 15–41)
Albumin: 4.2 g/dL (ref 3.5–5.0)
Alkaline Phosphatase: 82 U/L (ref 38–126)
Anion gap: 9 (ref 5–15)
BUN: 10 mg/dL (ref 8–23)
CO2: 26 mmol/L (ref 22–32)
Calcium: 8.8 mg/dL — ABNORMAL LOW (ref 8.9–10.3)
Chloride: 104 mmol/L (ref 98–111)
Creatinine: 0.7 mg/dL (ref 0.44–1.00)
GFR, Estimated: >60 mL/min (ref >60)
Glucose, Bld: 111 mg/dL — ABNORMAL HIGH (ref 70–99)
Potassium: 3.4 mmol/L — ABNORMAL LOW (ref 3.5–5.1)
Sodium: 139 mmol/L (ref 135–145)
Total Bilirubin: 0.5 mg/dL (ref 0.3–1.2)
Total Protein: 5.9 g/dL — ABNORMAL LOW (ref 6.5–8.1)

## 2023-01-01 MED ORDER — DEXAMETHASONE 4 MG PO TABS: 40.0000 mg | ORAL_TABLET | Freq: Once | ORAL | Status: DC

## 2023-01-01 MED ORDER — BORTEZOMIB CHEMO SQ INJECTION 3.5 MG (2.5MG/ML)
1.3000 mg/m2 | Freq: Once | INTRAMUSCULAR | Status: AC
  Administered 2023-01-01: 2 mg via SUBCUTANEOUS
  Filled 2023-01-01: qty 0.8

## 2023-01-01 NOTE — Progress Notes (Signed)
[facility] Spiritual Care Note  Visited Ms Alice Anthony and her daughter [name] briefly in infusion, but today's treatment involves two provider appointments, so we plan to follow up at a less-busy time.   [address] Rush Barer, [place], [facility] Pager [phone] Voicemail [phone]

## 2023-01-01 NOTE — Progress Notes (Signed)
[facility] [phone]   [phone]  PROGRESS NOTE  Patient Care Team: [name], MD as PCP - General (Family Medicine) Pickenpack-Cousar, [name], NP as Nurse Practitioner (Nurse Practitioner)  Hematological/Oncological History # Plasmacytoma of Thoracic Spine  # IgG Kappa Multiple myeloma 09/22/2022: Patient underwent MRI thoracic spine which showed acute or subacute T7, T10, and T11 compression fractures 10/10/2022: biopsy of compression fracture of T11 reveals a plasmacytoma 11/13/2022: establish care with Dr. Leonides Schanz  12/06/2022: bone marrow biopsy showed plasma cell myeloma involving 80% of the marrow.  12/24/2022: Cycle 1 Day 1 of Velcade/Dex  Interval History:  Alice Anthony 82 y.o. female with medical history significant for newly diagnosed IgG kappa multiple myeloma who presents for a follow up visit. The patient's last visit was on 12/21/2022 prior the the start of Cycle 1 of chemotherapy.   On exam today Alice Anthony reports that her first dose of Velcade therapy went quite well.  She did not notice any side effects.  She does not have any nausea, vomiting, or diarrhea.  She notes no numbness or tingling of her fingers and toes.  She did not have any burning or itching at the site of injection though there was some redness.  She reports she is trying her best to eat but her appetite remains poor.  Her energy levels are okay.  She notes that she is off to a good start.  She is taking 2 Senokot at night and 1 in the morning and typically takes her pain medication approximately every 4 hours.  Overall she is tolerating treatment well and is willing and able to continue at this time.  She denies any fevers, chills, sweats, nausea, vomiting or diarrhea.  A full 10 point ROS is otherwise negative.   MEDICAL HISTORY:  Past Medical History:  Diagnosis Date   Anxiety    Arthritis    Asthma    Hx: of   Cancer (HCC)    Hx: of skin cancer    Diverticulosis    GERD (gastroesophageal reflux disease)    Hx: of   History of kidney stones    Hyperlipemia    Hypertension    Pre-diabetes     SURGICAL HISTORY: Past Surgical History:  Procedure Laterality Date   ABDOMINAL SURGERY  09/19/2012   Ruptured Diverticuli, Ostomy Placed.   BREAST BIOPSY  06/06/2012   breast biopsy   CATARACT EXTRACTION W/ INTRAOCULAR LENS IMPLANT Bilateral    COLONOSCOPY  03/06/2012   COLOSTOMY CLOSURE  12/25/2012   Dr Luisa Hart   COLOSTOMY CLOSURE N/A 12/25/2012   Procedure: COLOSTOMY CLOSURE;  Surgeon: Clovis Pu. Cornett, MD;  Location: [facility] OR;  Service: General;  Laterality: N/A;   EXTRACORPOREAL SHOCK WAVE LITHOTRIPSY Left 11/17/2018   Procedure: EXTRACORPOREAL SHOCK WAVE LITHOTRIPSY (ESWL);  Surgeon: Malen Gauze, MD;  Location: [facility] ORS;  Service: Urology;  Laterality: Left;   EYE SURGERY  10/19/1998   Hx: of cyst removed from left eye   KYPHOPLASTY N/A 10/10/2022   Procedure: KYPHOPLASTY Thoracic seven , Thoracic ten,Thoracic eleven;  Surgeon: Tressie Stalker, MD;  Location: [facility] OR;  Service: Neurosurgery;  Laterality: N/A;   LAPAROTOMY N/A 09/19/2012   Procedure: EXPLORATORY LAPAROTOMY, sigmoid colectomy;  Surgeon: Clovis Pu. Cornett, MD;  Location: [facility] ORS;  Service: General;  Laterality: N/A;   SIGMOIDOSCOPY  01/11/1999   TONSILLECTOMY     TRIGGER FINGER RELEASE  12/25/2010   TUBAL LIGATION  10/16/1985    SOCIAL HISTORY: Social  History   Socioeconomic History   Marital status: Widowed    Spouse name: Not on file   Number of children: Not on file   Years of education: Not on file   Highest education level: Not on file  Occupational History   Not on file  Tobacco Use   Smoking status: Never   Smokeless tobacco: Never  Vaping Use   Vaping Use: Never used  Substance and Sexual Activity   Alcohol use: No   Drug use: No   Sexual activity: Not Currently  Other Topics Concern   Not on file  Social History Narrative   Not on file    Social Determinants of Health   Financial Resource Strain: Not on file  Food Insecurity: Not on file  Transportation Needs: Not on file  Physical Activity: Not on file  Stress: Not on file  Social Connections: Not on file  Intimate Partner Violence: Not on file    FAMILY HISTORY: Family History  Problem Relation Age of Onset   Diabetes Father    Alzheimer's disease Father    Basal cell carcinoma Mother        Metastatic    ALLERGIES:  is allergic to vioxx [rofecoxib], codeine, doxycycline hyclate, hydrocodone, and latex.  MEDICATIONS:  Current Outpatient Medications  Medication Sig Dispense Refill   acetaminophen (TYLENOL) 500 MG tablet Take 1,000 mg by mouth every 6 (six) hours as needed for moderate pain.     acyclovir (ZOVIRAX) 400 MG tablet Take 1 tablet (400 mg total) by mouth 2 (two) times daily. 180 tablet 1   ALPRAZolam (XANAX) 0.25 MG tablet Take 0.25 mg by mouth 3 (three) times daily as needed.     amLODipine (NORVASC) 5 MG tablet Take 5 mg by mouth daily.     Ascorbic Acid (VITAMIN C) 1000 MG tablet Take 1,000 mg by mouth daily.     aspirin 81 MG tablet Take 81 mg by mouth daily.     cetirizine (ZYRTEC) 10 MG tablet Take 10 mg by mouth at bedtime.     Cholecalciferol (VITAMIN D3) 2000 UNITS capsule Take 2,000 Units by mouth daily.     dexamethasone (DECADRON) 4 MG tablet Take 40 mg (10 tablets) by mouth once a week. 40 tablet 0   dextromethorphan-guaiFENesin (TUSSIN DM) 10-100 MG/5ML liquid Take 10 mLs by mouth every 4 (four) hours as needed for cough.     fluticasone (FLONASE) 50 MCG/ACT nasal spray Place 2 sprays into the nose at bedtime.     glimepiride (AMARYL) 2 MG tablet Take 1-2 mg by mouth daily as needed (high blood sugar).     hydrOXYzine (ATARAX) 25 MG tablet Take 25 mg by mouth at bedtime.     KRILL OIL PO Take 1 tablet by mouth daily.     metoCLOPramide (REGLAN) 10 MG tablet Take 1 tablet (10 mg total) by mouth every 6 (six) hours. 60 tablet 0    Multiple Vitamin (MULTIVITAMIN WITH MINERALS) TABS Take 1 tablet by mouth daily.     prochlorperazine (COMPAZINE) 10 MG tablet Take 1 tablet (10 mg total) by mouth every 6 (six) hours as needed for nausea or vomiting. 30 tablet 0   simvastatin (ZOCOR) 20 MG tablet Take 20 mg by mouth every evening.     traMADol (ULTRAM) 50 MG tablet Take 1-2 tablets (50-100 mg total) by mouth every 4 (four) hours as needed. 90 tablet 0   No current facility-administered medications for this visit.    REVIEW  OF SYSTEMS:   Constitutional: ( - ) fevers, ( - )  chills , ( - ) night sweats Eyes: ( - ) blurriness of vision, ( - ) double vision, ( - ) watery eyes Ears, nose, mouth, throat, and face: ( - ) mucositis, ( - ) sore throat Respiratory: ( - ) cough, ( - ) dyspnea, ( - ) wheezes Cardiovascular: ( - ) palpitation, ( - ) chest discomfort, ( - ) lower extremity swelling Gastrointestinal:  ( - ) nausea, ( - ) heartburn, ( - ) change in bowel habits Skin: ( - ) abnormal skin rashes Lymphatics: ( - ) new lymphadenopathy, ( - ) easy bruising Neurological: ( - ) numbness, ( - ) tingling, ( - ) new weaknesses Behavioral/Psych: ( - ) mood change, ( - ) new changes  All other systems were reviewed with the patient and are negative.  PHYSICAL EXAMINATION: ECOG PERFORMANCE STATUS: 1 - Symptomatic but completely ambulatory  There were no vitals filed for this visit.  There were no vitals filed for this visit.  GENERAL: Well-appearing elderly Caucasian female, alert, no distress and comfortable SKIN: skin color, texture, turgor are normal, no rashes or significant lesions EYES: conjunctiva are pink and non-injected, sclera clear LUNGS: clear to auscultation and percussion with normal breathing effort HEART: regular rate & rhythm and no murmurs and no lower extremity edema Musculoskeletal: no cyanosis of digits and no clubbing  PSYCH: alert & oriented x 3, fluent speech NEURO: no focal motor/sensory  deficits  LABORATORY DATA:  I have reviewed the data as listed    Latest Ref Rng & Units 12/21/2022   10:41 AM 12/06/2022    7:22 AM 11/21/2022   11:23 AM  CBC  WBC 4.0 - 10.5 K/uL 4.6  5.6  6.6   Hemoglobin 12.0 - 15.0 g/dL 16.1  09.6  04.5   Hematocrit 36.0 - 46.0 % 31.5  32.9  33.0   Platelets 150 - 400 K/uL 173  184  170        Latest Ref Rng & Units 12/21/2022   10:41 AM 11/21/2022   11:23 AM 11/14/2022   10:36 AM  CMP  Glucose 70 - 99 mg/dL 409  811  914   BUN 8 - 23 mg/dL 11  11  18    Creatinine 0.44 - 1.00 mg/dL 7.82  9.56  2.13   Sodium 135 - 145 mmol/L 142  142  144   Potassium 3.5 - 5.1 mmol/L 4.1  3.8  4.1   Chloride 98 - 111 mmol/L 106  106  106   CO2 22 - 32 mmol/L 27  23  32   Calcium 8.9 - 10.3 mg/dL 9.8  9.7  08.6   Total Protein 6.5 - 8.1 g/dL 6.8  6.6  7.0   Total Bilirubin 0.3 - 1.2 mg/dL 0.5  0.9  0.5   Alkaline Phos 38 - 126 U/L 90  74  85   AST 15 - 41 U/L 24  22  36   ALT 0 - 44 U/L 19  19  27      Lab Results  Component Value Date   MPROTEIN 0.1 (H) 11/14/2022   Lab Results  Component Value Date   KPAFRELGTCHN 4,793.0 (H) 11/14/2022   LAMBDASER 2.6 (L) 11/14/2022   KAPLAMBRATIO 735.45 (H) 11/29/2022   KAPLAMBRATIO 1,843.46 (H) 11/14/2022    RADIOGRAPHIC STUDIES: NM PET Image Initial (PI) Skull Base To Thigh  Result Date: 12/13/2022 CLINICAL DATA:  Initial treatment strategy for plasmacytoma. EXAM: NUCLEAR MEDICINE PET SKULL BASE TO THIGH TECHNIQUE: 6.4 mCi F-18 FDG was injected intravenously. Full-ring PET imaging was performed from the skull base to thigh after the radiotracer. CT data was obtained and used for attenuation correction and anatomic localization. Fasting blood glucose: 155 mg/dl COMPARISON:  CT abdomen/pelvis dated 11/11/2022. MRI thoracic spine dated 09/22/2022. FINDINGS: Mediastinal blood pool activity: SUV max 2.0 Liver activity: SUV max NA NECK: No hypermetabolic cervical lymphadenopathy. Incidental CT findings: None. CHEST: No  hypermetabolic pulmonary nodules. No hypermetabolic thoracic lymphadenopathy. Incidental CT findings: Scarring/atelectasis in the bilateral lower lobes. Atherosclerotic calcifications of the aortic arch. Mild coronary atherosclerosis of the LAD. ABDOMEN/PELVIS: No abnormal hypermetabolism in the liver, spleen, pancreas, or adrenal glands. No hypermetabolic abdominopelvic lymphadenopathy. Incidental CT findings: Cholelithiasis. Atherosclerotic calcifications the abdominal aorta and branch vessels. Status post left hemicolectomy with suture line in the lower pelvis. SKELETON: Diffuse hypermetabolism involving the visualized axial and appendicular skeleton, possibly reactive. No focal hypermetabolic activity to suggest skeletal metastasis or active myeloma. Incidental CT findings: Prior multilevel vertebral augmentation of the mid/lower thoracic spine. IMPRESSION: No findings to suggest active myeloma. Electronically Signed   By: Charline Bills M.D.   On: 12/13/2022 00:08   CT BONE MARROW BIOPSY & ASPIRATION  Result Date: 12/06/2022 INDICATION: 82 year old female with history of plasmacytoma. EXAM: CT-GUIDED BONE MARROW BIOPSY AND ASPIRATION MEDICATIONS: None ANESTHESIA/SEDATION: Fentanyl 100 mcg IV; Versed 0 mg IV Sedation Time: 0 minutes; The patient was continuously monitored during the procedure by the interventional radiology nurse under my direct supervision. COMPLICATIONS: None immediate. PROCEDURE: Informed consent was obtained from the patient following an explanation of the procedure, risks, benefits and alternatives. The patient understands, agrees and consents for the procedure. All questions were addressed. A time out was performed prior to the initiation of the procedure. The patient was positioned prone and non-contrast localization CT was performed of the pelvis to demonstrate the iliac marrow spaces. The operative site was prepped and draped in the usual sterile fashion. Under sterile conditions  and local anesthesia, a 22 gauge spinal needle was utilized for procedural planning. Next, an 11 gauge coaxial bone biopsy needle was advanced into the right iliac marrow space. Needle position was confirmed with CT imaging. Initially, a bone marrow aspiration was performed. Next, a bone marrow biopsy was obtained with the 11 gauge outer bone marrow device. Samples were prepared with the cytotechnologist and deemed adequate. The needle was removed and superficial hemostasis was obtained with manual compression. A dressing was applied. The patient tolerated the procedure well without immediate post procedural complication. IMPRESSION: Successful CT guided right iliac bone marrow aspiration and core biopsy. Marliss Coots, MD [facility] Electronically Signed   By: Marliss Coots M.D.   On: 12/06/2022 11:03    ASSESSMENT & PLAN Alice Anthony 82 y.o. female with medical history significant for newly diagnosed IgG kappa multiple myeloma who presents for a follow up visit.   # IgG Kappa Multiple Myeloma # Plasmacytoma of Spine  -- Patient diagnosed with plasmacytoma of the spine, subsequent bone marrow biopsy confirmed an IgG kappa multiple myeloma involving the bone marrow --start of Velcade/Dex Cycle 1 Day 1 on 12/24/2022. Plan: -- Labs today show creatinine 0.72, normal LFTs.  White blood cell 4.0, hemoglobin 10.5, MCV 103.8, and platelets of 149 -- Okay to proceed with treatment.  Cycle 1 day 8 --Can consider the addition of Revlimid to the patient's treatment regimen at a  later time. -- Return to clinic in 2 weeks with interval 1 week treatments.  #Supportive Care -- chemotherapy education today  -- port placement not required   -- zofran 8mg  q8H PRN and compazine 10mg  PO q6H for nausea -- acyclovir 400mg  PO BID for VCZ prophylaxis -- Awaiting dental clearance prior to the start of Xgeva/Zometa -- Tramadol 50 mg every 6 hours as needed for  pain control   No orders of the defined types were placed in this encounter.   All questions were answered. The patient knows to call the clinic with any problems, questions or concerns.  A total of more than 30 minutes were spent on this encounter with face-to-face time and non-face-to-face time, including preparing to see the patient, ordering tests and/or medications, counseling the patient and coordination of care as outlined above.   Ulysees Barns, MD Department of Hematology/Oncology [facility] at [facility] Phone: [phone] Pager: [phone] Email: [email or web address]  01/01/2023 7:41 AM

## 2023-01-02 ENCOUNTER — Encounter: Payer: Self-pay | Admitting: Hematology and Oncology

## 2023-01-02 DIAGNOSIS — K5641 Fecal impaction: Secondary | ICD-10-CM | POA: Diagnosis not present

## 2023-01-02 DIAGNOSIS — M8458XD Pathological fracture in neoplastic disease, other specified site, subsequent encounter for fracture with routine healing: Secondary | ICD-10-CM | POA: Diagnosis not present

## 2023-01-02 DIAGNOSIS — F419 Anxiety disorder, unspecified: Secondary | ICD-10-CM | POA: Diagnosis not present

## 2023-01-02 DIAGNOSIS — Z79899 Other long term (current) drug therapy: Secondary | ICD-10-CM | POA: Diagnosis not present

## 2023-01-02 DIAGNOSIS — M1990 Unspecified osteoarthritis, unspecified site: Secondary | ICD-10-CM | POA: Diagnosis not present

## 2023-01-02 DIAGNOSIS — C9030 Solitary plasmacytoma not having achieved remission: Secondary | ICD-10-CM | POA: Diagnosis not present

## 2023-01-04 ENCOUNTER — Encounter: Payer: Self-pay | Admitting: Hematology and Oncology

## 2023-01-04 NOTE — Progress Notes (Signed)
Called pt to introduce myself as her Financial Resource Specialist and to discuss the [employer] grant.  I left a msg requesting she return my call if she's interested in applying for the grant.  

## 2023-01-07 ENCOUNTER — Encounter: Payer: Self-pay | Admitting: General Practice

## 2023-01-07 ENCOUNTER — Other Ambulatory Visit: Payer: Self-pay | Admitting: *Deleted

## 2023-01-07 ENCOUNTER — Inpatient Hospital Stay: Payer: Medicare Other

## 2023-01-07 ENCOUNTER — Inpatient Hospital Stay: Payer: Medicare Other | Attending: Hematology and Oncology

## 2023-01-07 ENCOUNTER — Other Ambulatory Visit: Payer: Self-pay

## 2023-01-07 VITALS — BP 112/59 | HR 81 | Temp 98.4°F | Resp 17 | Wt 121.8 lb

## 2023-01-07 DIAGNOSIS — Z5112 Encounter for antineoplastic immunotherapy: Secondary | ICD-10-CM | POA: Insufficient documentation

## 2023-01-07 DIAGNOSIS — C9000 Multiple myeloma not having achieved remission: Secondary | ICD-10-CM | POA: Diagnosis not present

## 2023-01-07 DIAGNOSIS — Z79899 Other long term (current) drug therapy: Secondary | ICD-10-CM | POA: Insufficient documentation

## 2023-01-07 LAB — CMP (CANCER CENTER ONLY)
ALT: 13 U/L (ref 0–44)
AST: 13 U/L — ABNORMAL LOW (ref 15–41)
Albumin: 4.4 g/dL (ref 3.5–5.0)
Alkaline Phosphatase: 119 U/L (ref 38–126)
Anion gap: 9 (ref 5–15)
BUN: 14 mg/dL (ref 8–23)
CO2: 24 mmol/L (ref 22–32)
Calcium: 9.5 mg/dL (ref 8.9–10.3)
Chloride: 104 mmol/L (ref 98–111)
Creatinine: 0.78 mg/dL (ref 0.44–1.00)
GFR, Estimated: >60 mL/min (ref >60)
Glucose, Bld: 199 mg/dL — ABNORMAL HIGH (ref 70–99)
Potassium: 3.9 mmol/L (ref 3.5–5.1)
Sodium: 137 mmol/L (ref 135–145)
Total Bilirubin: 0.8 mg/dL (ref 0.3–1.2)
Total Protein: 6.5 g/dL (ref 6.5–8.1)

## 2023-01-07 LAB — CBC WITH DIFFERENTIAL (CANCER CENTER ONLY)
Abs Immature Granulocytes: 0 10*3/uL (ref 0.00–0.07)
Basophils Absolute: 0 10*3/uL (ref 0.0–0.1)
Basophils Relative: 0 %
Eosinophils Absolute: 0 10*3/uL (ref 0.0–0.5)
Eosinophils Relative: 0 %
HCT: 33.2 % — ABNORMAL LOW (ref 36.0–46.0)
Hemoglobin: 11.7 g/dL — ABNORMAL LOW (ref 12.0–15.0)
Immature Granulocytes: 0 %
Lymphocytes Relative: 9 %
Lymphs Abs: 0.3 10*3/uL — ABNORMAL LOW (ref 0.7–4.0)
MCH: 36 pg — ABNORMAL HIGH (ref 26.0–34.0)
MCHC: 35.2 g/dL (ref 30.0–36.0)
MCV: 102.2 fL — ABNORMAL HIGH (ref 80.0–100.0)
Monocytes Absolute: 0 10*3/uL — ABNORMAL LOW (ref 0.1–1.0)
Monocytes Relative: 1 %
Neutro Abs: 3 10*3/uL (ref 1.7–7.7)
Neutrophils Relative %: 90 %
Platelet Count: 141 10*3/uL — ABNORMAL LOW (ref 150–400)
RBC: 3.25 MIL/uL — ABNORMAL LOW (ref 3.87–5.11)
RDW: 13.8 % (ref 11.5–15.5)
WBC Count: 3.4 10*3/uL — ABNORMAL LOW (ref 4.0–10.5)
nRBC: 0 % (ref 0.0–0.2)

## 2023-01-07 MED ORDER — DEXAMETHASONE 4 MG PO TABS: ORAL_TABLET | ORAL | 1 refills | Status: DC

## 2023-01-07 MED ORDER — ZOLEDRONIC ACID 4 MG/5ML IV CONC
3.3000 mg | Freq: Once | INTRAVENOUS | Status: AC
  Administered 2023-01-07: 3.3 mg via INTRAVENOUS
  Filled 2023-01-07: qty 4.13

## 2023-01-07 MED ORDER — BORTEZOMIB CHEMO SQ INJECTION 3.5 MG (2.5MG/ML)
1.3000 mg/m2 | Freq: Once | INTRAMUSCULAR | Status: AC
  Administered 2023-01-07: 2 mg via SUBCUTANEOUS
  Filled 2023-01-07: qty 0.8

## 2023-01-07 MED ORDER — SODIUM CHLORIDE 0.9 % IV SOLN: Freq: Once | INTRAVENOUS | Status: AC

## 2023-01-07 MED ORDER — ZOLEDRONIC ACID 4 MG/5ML IV CONC
3.0000 mg | Freq: Once | INTRAVENOUS | Status: DC
  Filled 2023-01-07: qty 3.75

## 2023-01-07 NOTE — Progress Notes (Signed)
[facility] Spiritual Care Note  Followed up with Ms Alice Anthony in infusion. She was accompanied by her daughter-in-law Alice Anthony, who works at [employer].  Ms Alice Anthony is still coping with pain that inhibits her ADLs, which demonstrates her resilience, especially when coupled with her positive attitude. She is working to take one day at a time, instead of focusing on a bigger picture over a longer period. We talked about how this perspective is a helpful coping skill.  Ms Alice Anthony values pastoral check-ins and prayer.  We plan to follow up at a future treatment.   [address] Rush Barer, [place], [facility] Pager [phone] Voicemail [phone]

## 2023-01-07 NOTE — Patient Instructions (Signed)
[facility] [place] [facility]  Discharge Instructions: Thank you for choosing [facility] to provide your oncology and hematology care.   If you have a lab appointment with the Cancer Center, please go directly to the Cancer Center and check in at the registration area.   Wear comfortable clothing and clothing appropriate for easy access to any Portacath or PICC line.   We strive to give you quality time with your provider. You may need to reschedule your appointment if you arrive late (15 or more minutes).  Arriving late affects you and other patients whose appointments are after yours.  Also, if you miss three or more appointments without notifying the office, you may be dismissed from the clinic at the provider's discretion.      For prescription refill requests, have your pharmacy contact our office and allow 72 hours for refills to be completed.    Today you received the following chemotherapy and/or immunotherapy agents : Velcade      To help prevent nausea and vomiting after your treatment, we encourage you to take your nausea medication as directed.  BELOW ARE SYMPTOMS THAT SHOULD BE REPORTED IMMEDIATELY: *FEVER GREATER THAN 100.4 F (38 C) OR HIGHER *CHILLS OR SWEATING *NAUSEA AND VOMITING THAT IS NOT CONTROLLED WITH YOUR NAUSEA MEDICATION *UNUSUAL SHORTNESS OF BREATH *UNUSUAL BRUISING OR BLEEDING *URINARY PROBLEMS (pain or burning when urinating, or frequent urination) *BOWEL PROBLEMS (unusual diarrhea, constipation, pain near the anus) TENDERNESS IN MOUTH AND THROAT WITH OR WITHOUT PRESENCE OF ULCERS (sore throat, sores in mouth, or a toothache) UNUSUAL RASH, SWELLING OR PAIN  UNUSUAL VAGINAL DISCHARGE OR ITCHING   Items with * indicate a potential emergency and should be followed up as soon as possible or go to the Emergency Department if any problems should occur.  Please show the CHEMOTHERAPY ALERT CARD or IMMUNOTHERAPY ALERT CARD at  check-in to the Emergency Department and triage nurse.  Should you have questions after your visit or need to cancel or reschedule your appointment, please contact [place] [facility]  Dept: [phone]  and follow the prompts.  Office hours are 8:00 a.m. to 4:30 p.m. Monday - Friday. Please note that voicemails left after 4:00 p.m. may not be returned until the following business day.  We are closed weekends and major holidays. You have access to a nurse at all times for urgent questions. Please call the main number to the clinic Dept: [phone] and follow the prompts.   For any non-urgent questions, you may also contact your provider using MyChart. We now offer e-Visits for anyone 110 and older to request care online for non-urgent symptoms. For details visit [email or web address].   Also download the MyChart app! Go to the app store, search "MyChart", open the app, select , and log in with your MyChart username and password.  Zoledronic Acid Injection (Cancer) What is this medication? ZOLEDRONIC ACID (ZOE le dron ik AS id) treats high calcium levels in the blood caused by cancer. It may also be used with chemotherapy to treat weakened bones caused by cancer. It works by slowing down the release of calcium from bones. This lowers calcium levels in your blood. It also makes your bones stronger and less likely to break (fracture). It belongs to a group of medications called bisphosphonates. This medicine may be used for other purposes; ask your health care provider or pharmacist if you have questions. COMMON BRAND NAME(S): Zometa, Zometa Powder What  should I tell my care team before I take this medication? They need to know if you have any of these conditions: Dehydration Dental disease Kidney disease Liver disease Low levels of calcium in the blood Lung or breathing disease, such as asthma Receiving steroids, such as dexamethasone or prednisone An  unusual or allergic reaction to zoledronic acid, other medications, foods, dyes, or preservatives Pregnant or trying to get pregnant Breast-feeding How should I use this medication? This medication is injected into a vein. It is given by your care team in a hospital or clinic setting. Talk to your care team about the use of this medication in children. Special care may be needed. Overdosage: If you think you have taken too much of this medicine contact a poison control center or emergency room at once. NOTE: This medicine is only for you. Do not share this medicine with others. What if I miss a dose? Keep appointments for follow-up doses. It is important not to miss your dose. Call your care team if you are unable to keep an appointment. What may interact with this medication? Certain antibiotics given by injection Diuretics, such as bumetanide, furosemide NSAIDs, medications for pain and inflammation, such as ibuprofen or naproxen Teriparatide Thalidomide This list may not describe all possible interactions. Give your health care provider a list of all the medicines, herbs, non-prescription drugs, or dietary supplements you use. Also tell them if you smoke, drink alcohol, or use illegal drugs. Some items may interact with your medicine. What should I watch for while using this medication? Visit your care team for regular checks on your progress. It may be some time before you see the benefit from this medication. Some people who take this medication have severe bone, joint, or muscle pain. This medication may also increase your risk for jaw problems or a broken thigh bone. Tell your care team right away if you have severe pain in your jaw, bones, joints, or muscles. Tell you care team if you have any pain that does not go away or that gets worse. Tell your dentist and dental surgeon that you are taking this medication. You should not have major dental surgery while on this medication. See your  dentist to have a dental exam and fix any dental problems before starting this medication. Take good care of your teeth while on this medication. Make sure you see your dentist for regular follow-up appointments. You should make sure you get enough calcium and vitamin D while you are taking this medication. Discuss the foods you eat and the vitamins you take with your care team. Check with your care team if you have severe diarrhea, nausea, and vomiting, or if you sweat a lot. The loss of too much body fluid may make it dangerous for you to take this medication. You may need bloodwork while taking this medication. Talk to your care team if you wish to become pregnant or think you might be pregnant. This medication can cause serious birth defects. What side effects may I notice from receiving this medication? Side effects that you should report to your care team as soon as possible: Allergic reactions--skin rash, itching, hives, swelling of the face, lips, tongue, or throat Kidney injury--decrease in the amount of urine, swelling of the ankles, hands, or feet Low calcium level--muscle pain or cramps, confusion, tingling, or numbness in the hands or feet Osteonecrosis of the jaw--pain, swelling, or redness in the mouth, numbness of the jaw, poor healing after dental work, unusual  discharge from the mouth, visible bones in the mouth Severe bone, joint, or muscle pain Side effects that usually do not require medical attention (report to your care team if they continue or are bothersome): Constipation Fatigue Fever Loss of appetite Nausea Stomach pain This list may not describe all possible side effects. Call your doctor for medical advice about side effects. You may report side effects to FDA at 1-800-FDA-1088. Where should I keep my medication? This medication is given in a hospital or clinic. It will not be stored at home. NOTE: This sheet is a summary. It may not cover all possible information.  If you have questions about this medicine, talk to your doctor, pharmacist, or health care provider.  2024 Elsevier/Gold Standard (2021-09-15 00:00:00)

## 2023-01-07 NOTE — Progress Notes (Signed)
CrCl = 49 mL/min. OK to dose reduce Zometa per Dr. Margurite Auerbach, Pharm.D., CPP 01/07/2023@3 :43 PM

## 2023-01-09 DIAGNOSIS — F419 Anxiety disorder, unspecified: Secondary | ICD-10-CM | POA: Diagnosis not present

## 2023-01-09 DIAGNOSIS — C9030 Solitary plasmacytoma not having achieved remission: Secondary | ICD-10-CM | POA: Diagnosis not present

## 2023-01-09 DIAGNOSIS — Z79899 Other long term (current) drug therapy: Secondary | ICD-10-CM | POA: Diagnosis not present

## 2023-01-09 DIAGNOSIS — M8458XD Pathological fracture in neoplastic disease, other specified site, subsequent encounter for fracture with routine healing: Secondary | ICD-10-CM | POA: Diagnosis not present

## 2023-01-09 DIAGNOSIS — M1990 Unspecified osteoarthritis, unspecified site: Secondary | ICD-10-CM | POA: Diagnosis not present

## 2023-01-09 DIAGNOSIS — K5641 Fecal impaction: Secondary | ICD-10-CM | POA: Diagnosis not present

## 2023-01-09 NOTE — Progress Notes (Signed)
Palliative Medicine [facility]  [phone] [phone]) [phone]   Name: Alice Anthony Date: 01/09/2023 MRN: [id number]  DOB: [birth year]  Patient Care Team: [name], MD as PCP - General (Family Medicine) Pickenpack-Cousar, [name], NP as Nurse Practitioner (Nurse Practitioner)    INTERVAL HISTORY: Alice Anthony is a 82 y.o. female with oncologic medical history including recent diagnosis of multiple myeloma (12/2022), DM, and posterior vitreous detachment of left and right eyes. Palliative ask to see for symptom management and goals of care   SOCIAL HISTORY:     reports that she has never smoked. She has never used smokeless tobacco. She reports that she does not drink alcohol and does not use drugs.  ADVANCE DIRECTIVES:  Advanced directives on file  CODE STATUS: Full code  PAST MEDICAL HISTORY: Past Medical History:  Diagnosis Date   Anxiety    Arthritis    Asthma    Hx: of   Cancer (HCC)    Hx: of skin cancer   Diverticulosis    GERD (gastroesophageal reflux disease)    Hx: of   History of kidney stones    Hyperlipemia    Hypertension    Pre-diabetes     ALLERGIES:  is allergic to vioxx [rofecoxib], codeine, doxycycline hyclate, hydrocodone, and latex.  MEDICATIONS:  Current Outpatient Medications  Medication Sig Dispense Refill   acetaminophen (TYLENOL) 500 MG tablet Take 1,000 mg by mouth every 6 (six) hours as needed for moderate pain.     acyclovir (ZOVIRAX) 400 MG tablet Take 1 tablet (400 mg total) by mouth 2 (two) times daily. 180 tablet 1   ALPRAZolam (XANAX) 0.25 MG tablet Take 0.25 mg by mouth 3 (three) times daily as needed.     amLODipine (NORVASC) 5 MG tablet Take 5 mg by mouth daily.     Ascorbic Acid (VITAMIN C) 1000 MG tablet Take 1,000 mg by mouth daily.     aspirin 81 MG tablet Take 81 mg by mouth daily.     cetirizine (ZYRTEC) 10 MG tablet Take 10 mg by mouth at bedtime.     Cholecalciferol (VITAMIN D3)  2000 UNITS capsule Take 2,000 Units by mouth daily.     dexamethasone (DECADRON) 4 MG tablet Take 40 mg (10 tablets) by mouth once a week. 40 tablet 1   dextromethorphan-guaiFENesin (TUSSIN DM) 10-100 MG/5ML liquid Take 10 mLs by mouth every 4 (four) hours as needed for cough.     fluticasone (FLONASE) 50 MCG/ACT nasal spray Place 2 sprays into the nose at bedtime.     glimepiride (AMARYL) 2 MG tablet Take 1-2 mg by mouth daily as needed (high blood sugar).     hydrOXYzine (ATARAX) 25 MG tablet Take 25 mg by mouth at bedtime.     KRILL OIL PO Take 1 tablet by mouth daily.     metoCLOPramide (REGLAN) 10 MG tablet Take 1 tablet (10 mg total) by mouth every 6 (six) hours. 60 tablet 0   Multiple Vitamin (MULTIVITAMIN WITH MINERALS) TABS Take 1 tablet by mouth daily.     prochlorperazine (COMPAZINE) 10 MG tablet Take 1 tablet (10 mg total) by mouth every 6 (six) hours as needed for nausea or vomiting. 30 tablet 0   simvastatin (ZOCOR) 20 MG tablet Take 20 mg by mouth every evening.     traMADol (ULTRAM) 50 MG tablet Take 1-2 tablets (50-100 mg total) by mouth every 4 (four) hours as needed. 90 tablet 0  No current facility-administered medications for this visit.    VITAL SIGNS: There were no vitals taken for this visit. There were no vitals filed for this visit.  Estimated body mass index is 23 kg/m as calculated from the following:   Height as of 12/24/22: 5\' 1"  (1.549 m).   Weight as of 01/07/23: 121 lb 12 oz (55.2 kg).   PERFORMANCE STATUS (ECOG) : 2 - Symptomatic, <50% confined to bed   Physical Exam General: NAD, sitting in recliner  Cardiovascular: regular rate and rhythm Pulmonary: normal breathing pattern  Extremities: no edema, no joint deformities Skin: no rashes Neurological: AAO x4  IMPRESSION:  I saw Ms. Dirico during her infusion. No acute distress noted. Daughter is present. Overall she has been doing well. Denies nausea, vomiting, diarrhea.   Neoplasm related  pain Ms. Brickner reports pain is well controlled on current regimen. She is tolerating Tramadol 50-100mg .    We discussed continuing current regimen without changes at this time.   Will continue close follow-up.    Nausea  Controlled   3. Constipation Ms. Bonet continues to struggle with constipation. She has recently made some adjustments. Unfortunately she took several senna one day last week causing loose stools one day. This has since resolved. Education provided on bowel regimen. She will begin taking 2 Senna twice daily. She will notify the team if no improvement.  We discussed possibility of prescription strength use if needed.   4. Goals of Care  5/20- We discussed her current illness and what it means in the larger context of her on-going co-morbidities. Natural disease trajectory and expectations were discussed.   Patient and daughter are realistic in their understanding of current illness. Is remaining hopeful for stability and improve quality of life.   We discussed Her current illness and what it means in the larger context of Her on-going co-morbidities. Natural disease trajectory and expectations were discussed.  I discussed the importance of continued conversation with family and their medical providers regarding overall plan of care and treatment options, ensuring decisions are within the context of the patients values and GOCs.  PLAN:  Tramadol 50-100 mg every 4-6 hours as needed Senna-S 2 pills twice daily Compazine as needed Reglan every 6 hours Ongoing support and symptom management I will plan to see patient back in 3-4 weeks in collaboration to other oncology appointments.    Patient expressed understanding and was in agreement with this plan. She also understands that She can call the clinic at any time with any questions, concerns, or complaints.   Any controlled substances utilized were prescribed in the context of palliative care. PDMP has been  reviewed.    Visit consisted of counseling and education dealing with the complex and emotionally intense issues of symptom management and palliative care in the setting of serious and potentially life-threatening illness.Greater than 50%  of this time was spent counseling and coordinating care related to the above assessment and plan.  Willette Alma, AGPCNP-BC  Palliative Medicine Team/[facility]  *Please note that this is a verbal dictation therefore any spelling or grammatical errors are due to the "[employer]agon [employer] One" system interpretation.

## 2023-01-10 DIAGNOSIS — Z79899 Other long term (current) drug therapy: Secondary | ICD-10-CM | POA: Diagnosis not present

## 2023-01-10 DIAGNOSIS — F419 Anxiety disorder, unspecified: Secondary | ICD-10-CM | POA: Diagnosis not present

## 2023-01-10 DIAGNOSIS — C9030 Solitary plasmacytoma not having achieved remission: Secondary | ICD-10-CM | POA: Diagnosis not present

## 2023-01-10 DIAGNOSIS — M1990 Unspecified osteoarthritis, unspecified site: Secondary | ICD-10-CM | POA: Diagnosis not present

## 2023-01-10 DIAGNOSIS — M8458XD Pathological fracture in neoplastic disease, other specified site, subsequent encounter for fracture with routine healing: Secondary | ICD-10-CM | POA: Diagnosis not present

## 2023-01-10 DIAGNOSIS — K5641 Fecal impaction: Secondary | ICD-10-CM | POA: Diagnosis not present

## 2023-01-11 ENCOUNTER — Telehealth: Payer: Self-pay | Admitting: *Deleted

## 2023-01-11 NOTE — Telephone Encounter (Signed)
Received call from pt's daughter. She states that her mother hasn't had a good BM in 3 days despite taking senna s 2 at night and 2 in the morning. Advised to take 3-4 tonight and see how she does in the morning. If no BM, she can take 3 more.  Advised to increase fluid intake as well.  She can go up to as much as 4 at night and 4 in the morning. But hopefully she wont need that much.  [name] voiced understanding.

## 2023-01-13 NOTE — Progress Notes (Unsigned)
[facility] [phone]   [phone]  PROGRESS NOTE  Patient Care Team: [name], MD as PCP - General (Family Medicine) Pickenpack-Cousar, [name], NP as Nurse Practitioner (Nurse Practitioner)  Hematological/Oncological History # Plasmacytoma of Thoracic Spine  # IgG Kappa Multiple myeloma 09/22/2022: Patient underwent MRI thoracic spine which showed acute or subacute T7, T10, and T11 compression fractures 10/10/2022: biopsy of compression fracture of T11 reveals a plasmacytoma 11/13/2022: establish care with Dr. Leonides Schanz  12/06/2022: bone marrow biopsy showed plasma cell myeloma involving 80% of the marrow.  12/24/2022: Cycle 1 Day 1 of Velcade/Dex 01/14/2023: Cycle 2 Day 1 of Velcade/Dex  Interval History:  Alice Anthony 82 y.o. female with medical history significant for newly diagnosed IgG kappa multiple myeloma who presents for a follow up visit. The patient's last visit was on 01/01/2023.  In the interim since the last visit she received her first dose of Zometa.  On exam today Ms. Fittipaldi reports she received Zometa last week without any difficulty.  She did however develop constipation.  She reports that she has had no issues with her Velcade injection.  She reports that the tramadol does take quite some time to kick in and can sometimes take up to 2 hours before it is effective.  She has not been taking it every 4 hours as prescribed and is taking a rare dose here and there.  She reports her appetite is good and overall she feels well.  She notes no numbness or tingling of her fingers and toes..  Overall she is tolerating treatment well and is willing and able to continue at this time.  She denies any fevers, chills, sweats, nausea, vomiting or diarrhea.  A full 10 point ROS is otherwise negative.   MEDICAL HISTORY:  Past Medical History:  Diagnosis Date   Anxiety    Arthritis    Asthma    Hx: of   Cancer (HCC)    Hx: of skin cancer    Diverticulosis    GERD (gastroesophageal reflux disease)    Hx: of   History of kidney stones    Hyperlipemia    Hypertension    Pre-diabetes     SURGICAL HISTORY: Past Surgical History:  Procedure Laterality Date   ABDOMINAL SURGERY  09/19/2012   Ruptured Diverticuli, Ostomy Placed.   BREAST BIOPSY  06/06/2012   breast biopsy   CATARACT EXTRACTION W/ INTRAOCULAR LENS IMPLANT Bilateral    COLONOSCOPY  03/06/2012   COLOSTOMY CLOSURE  12/25/2012   Dr Luisa Hart   COLOSTOMY CLOSURE N/A 12/25/2012   Procedure: COLOSTOMY CLOSURE;  Surgeon: Clovis Pu. Cornett, MD;  Location: [facility] OR;  Service: General;  Laterality: N/A;   EXTRACORPOREAL SHOCK WAVE LITHOTRIPSY Left 11/17/2018   Procedure: EXTRACORPOREAL SHOCK WAVE LITHOTRIPSY (ESWL);  Surgeon: Malen Gauze, MD;  Location: [facility] ORS;  Service: Urology;  Laterality: Left;   EYE SURGERY  10/19/1998   Hx: of cyst removed from left eye   KYPHOPLASTY N/A 10/10/2022   Procedure: KYPHOPLASTY Thoracic seven , Thoracic ten,Thoracic eleven;  Surgeon: Tressie Stalker, MD;  Location: [facility] OR;  Service: Neurosurgery;  Laterality: N/A;   LAPAROTOMY N/A 09/19/2012   Procedure: EXPLORATORY LAPAROTOMY, sigmoid colectomy;  Surgeon: Clovis Pu. Cornett, MD;  Location: [facility] ORS;  Service: General;  Laterality: N/A;   SIGMOIDOSCOPY  01/11/1999   TONSILLECTOMY     TRIGGER FINGER RELEASE  12/25/2010   TUBAL LIGATION  10/16/1985    SOCIAL HISTORY: Social History   Socioeconomic  History   Marital status: Widowed    Spouse name: Not on file   Number of children: Not on file   Years of education: Not on file   Highest education level: Not on file  Occupational History   Not on file  Tobacco Use   Smoking status: Never   Smokeless tobacco: Never  Vaping Use   Vaping Use: Never used  Substance and Sexual Activity   Alcohol use: No   Drug use: No   Sexual activity: Not Currently  Other Topics Concern   Not on file  Social History Narrative   Not on file    Social Determinants of Health   Financial Resource Strain: Not on file  Food Insecurity: Not on file  Transportation Needs: Not on file  Physical Activity: Not on file  Stress: Not on file  Social Connections: Not on file  Intimate Partner Violence: Not on file    FAMILY HISTORY: Family History  Problem Relation Age of Onset   Diabetes Father    Alzheimer's disease Father    Basal cell carcinoma Mother        Metastatic    ALLERGIES:  is allergic to vioxx [rofecoxib], codeine, doxycycline hyclate, hydrocodone, and latex.  MEDICATIONS:  Current Outpatient Medications  Medication Sig Dispense Refill   acetaminophen (TYLENOL) 500 MG tablet Take 1,000 mg by mouth every 6 (six) hours as needed for moderate pain.     acyclovir (ZOVIRAX) 400 MG tablet Take 1 tablet (400 mg total) by mouth 2 (two) times daily. 180 tablet 1   ALPRAZolam (XANAX) 0.25 MG tablet Take 0.25 mg by mouth 3 (three) times daily as needed.     amLODipine (NORVASC) 5 MG tablet Take 5 mg by mouth daily.     aspirin 81 MG tablet Take 81 mg by mouth daily.     cetirizine (ZYRTEC) 10 MG tablet Take 10 mg by mouth at bedtime.     Cholecalciferol (VITAMIN D3) 2000 UNITS capsule Take 2,000 Units by mouth daily.     dexamethasone (DECADRON) 4 MG tablet Take 40 mg (10 tablets) by mouth once a week. 40 tablet 1   dextromethorphan-guaiFENesin (TUSSIN DM) 10-100 MG/5ML liquid Take 10 mLs by mouth every 4 (four) hours as needed for cough.     fluticasone (FLONASE) 50 MCG/ACT nasal spray Place 2 sprays into the nose at bedtime.     glimepiride (AMARYL) 2 MG tablet Take 1-2 mg by mouth daily as needed (high blood sugar). (Patient not taking: Reported on 01/14/2023)     hydrOXYzine (ATARAX) 25 MG tablet Take 25 mg by mouth at bedtime.     metoCLOPramide (REGLAN) 10 MG tablet Take 1 tablet (10 mg total) by mouth every 6 (six) hours. 60 tablet 0   prochlorperazine (COMPAZINE) 10 MG tablet Take 1 tablet (10 mg total) by mouth  every 6 (six) hours as needed for nausea or vomiting. 30 tablet 0   simvastatin (ZOCOR) 20 MG tablet Take 20 mg by mouth every evening.     traMADol (ULTRAM) 50 MG tablet Take 1-2 tablets (50-100 mg total) by mouth every 4 (four) hours as needed. 90 tablet 0   No current facility-administered medications for this visit.    REVIEW OF SYSTEMS:   Constitutional: ( - ) fevers, ( - )  chills , ( - ) night sweats Eyes: ( - ) blurriness of vision, ( - ) double vision, ( - ) watery eyes Ears, nose, mouth, throat, and face: ( - )  mucositis, ( - ) sore throat Respiratory: ( - ) cough, ( - ) dyspnea, ( - ) wheezes Cardiovascular: ( - ) palpitation, ( - ) chest discomfort, ( - ) lower extremity swelling Gastrointestinal:  ( - ) nausea, ( - ) heartburn, ( - ) change in bowel habits Skin: ( - ) abnormal skin rashes Lymphatics: ( - ) new lymphadenopathy, ( - ) easy bruising Neurological: ( - ) numbness, ( - ) tingling, ( - ) new weaknesses Behavioral/Psych: ( - ) mood change, ( - ) new changes  All other systems were reviewed with the patient and are negative.  PHYSICAL EXAMINATION: ECOG PERFORMANCE STATUS: 1 - Symptomatic but completely ambulatory  Vitals:   01/14/23 0953  BP: (!) 89/72  Pulse: 98  Resp: 17  Temp: 98 F (36.7 C)  SpO2: 97%    Filed Weights   01/14/23 0953  Weight: 121 lb 8 oz (55.1 kg)    GENERAL: Well-appearing elderly Caucasian female, alert, no distress and comfortable SKIN: skin color, texture, turgor are normal, no rashes or significant lesions EYES: conjunctiva are pink and non-injected, sclera clear LUNGS: clear to auscultation and percussion with normal breathing effort HEART: regular rate & rhythm and no murmurs and no lower extremity edema Musculoskeletal: no cyanosis of digits and no clubbing  PSYCH: alert & oriented x 3, fluent speech NEURO: no focal motor/sensory deficits  LABORATORY DATA:  I have reviewed the data as listed    Latest Ref Rng & Units  01/14/2023    9:25 AM 01/07/2023    2:18 PM 01/01/2023    7:54 AM  CBC  WBC 4.0 - 10.5 K/uL 6.2  3.4  4.0   Hemoglobin 12.0 - 15.0 g/dL 78.2  95.6  21.3   Hematocrit 36.0 - 46.0 % 32.5  33.2  29.8   Platelets 150 - 400 K/uL 215  141  149        Latest Ref Rng & Units 01/14/2023    9:25 AM 01/07/2023    2:18 PM 01/01/2023    7:54 AM  CMP  Glucose 70 - 99 mg/dL 086  578  469   BUN 8 - 23 mg/dL 12  14  10    Creatinine 0.44 - 1.00 mg/dL 6.29  5.28  4.13   Sodium 135 - 145 mmol/L 141  137  139   Potassium 3.5 - 5.1 mmol/L 3.9  3.9  3.4   Chloride 98 - 111 mmol/L 108  104  104   CO2 22 - 32 mmol/L 26  24  26    Calcium 8.9 - 10.3 mg/dL 8.6  9.5  8.8   Total Protein 6.5 - 8.1 g/dL 6.0  6.5  5.9   Total Bilirubin 0.3 - 1.2 mg/dL 0.5  0.8  0.5   Alkaline Phos 38 - 126 U/L 137  119  82   AST 15 - 41 U/L 12  13  14    ALT 0 - 44 U/L 9  13  16      Lab Results  Component Value Date   MPROTEIN 0.1 (H) 11/14/2022   Lab Results  Component Value Date   KPAFRELGTCHN 4,793.0 (H) 11/14/2022   LAMBDASER 2.6 (L) 11/14/2022   KAPLAMBRATIO 735.45 (H) 11/29/2022   KAPLAMBRATIO 1,843.46 (H) 11/14/2022    RADIOGRAPHIC STUDIES: No results found.  ASSESSMENT & PLAN Alice Anthony 82 y.o. female with medical history significant for newly diagnosed IgG kappa multiple myeloma who presents for a follow up visit.   #  IgG Kappa Multiple Myeloma # Plasmacytoma of Spine  -- Patient diagnosed with plasmacytoma of the spine, subsequent bone marrow biopsy confirmed an IgG kappa multiple myeloma involving the bone marrow --start of Velcade/Dex Cycle 1 Day 1 on 12/24/2022. Plan: -- Labs today show creatinine 0.68, white blood cell 6.2, hemoglobin 11.7, MCV 104.5, and platelets of 215 -- Okay to proceed with treatment.  Cycle 2 day 1 --Can consider the addition of Revlimid to the patient's treatment regimen at a later time. -- Return to clinic in 2 weeks with interval 1 week treatments.  #Supportive Care --  chemotherapy education complete -- port placement not required   -- zofran 8mg  q8H PRN and compazine 10mg  PO q6H for nausea -- acyclovir 400mg  PO BID for VCZ prophylaxis -- Recieved dental clearance prior to the start of Xgeva/Zometa. First dose on 01/07/2023.  -- Tramadol 50 mg every 6 hours as needed for pain control   No orders of the defined types were placed in this encounter.   All questions were answered. The patient knows to call the clinic with any problems, questions or concerns.  A total of more than 30 minutes were spent on this encounter with face-to-face time and non-face-to-face time, including preparing to see the patient, ordering tests and/or medications, counseling the patient and coordination of care as outlined above.   Ulysees Barns, MD Department of Hematology/Oncology [facility] Phone: [phone] Pager: [phone] Email: [email or web address]  01/15/2023 4:31 PM

## 2023-01-14 ENCOUNTER — Encounter: Payer: Self-pay | Admitting: Nurse Practitioner

## 2023-01-14 ENCOUNTER — Inpatient Hospital Stay: Payer: Medicare Other

## 2023-01-14 ENCOUNTER — Other Ambulatory Visit: Payer: Self-pay

## 2023-01-14 ENCOUNTER — Inpatient Hospital Stay (HOSPITAL_BASED_OUTPATIENT_CLINIC_OR_DEPARTMENT_OTHER): Payer: Medicare Other | Admitting: Nurse Practitioner

## 2023-01-14 ENCOUNTER — Encounter: Payer: Self-pay | Admitting: General Practice

## 2023-01-14 ENCOUNTER — Inpatient Hospital Stay (HOSPITAL_BASED_OUTPATIENT_CLINIC_OR_DEPARTMENT_OTHER): Payer: Medicare Other | Admitting: Hematology and Oncology

## 2023-01-14 VITALS — BP 89/72 | HR 98 | Temp 98.0°F | Resp 17 | Wt 121.5 lb

## 2023-01-14 VITALS — BP 120/68 | HR 98

## 2023-01-14 DIAGNOSIS — C9000 Multiple myeloma not having achieved remission: Secondary | ICD-10-CM

## 2023-01-14 DIAGNOSIS — G893 Neoplasm related pain (acute) (chronic): Secondary | ICD-10-CM | POA: Diagnosis not present

## 2023-01-14 DIAGNOSIS — Z79899 Other long term (current) drug therapy: Secondary | ICD-10-CM | POA: Diagnosis not present

## 2023-01-14 DIAGNOSIS — Z5112 Encounter for antineoplastic immunotherapy: Secondary | ICD-10-CM | POA: Diagnosis not present

## 2023-01-14 DIAGNOSIS — Z515 Encounter for palliative care: Secondary | ICD-10-CM

## 2023-01-14 DIAGNOSIS — R530 Neoplastic (malignant) related fatigue: Secondary | ICD-10-CM | POA: Diagnosis not present

## 2023-01-14 DIAGNOSIS — K5900 Constipation, unspecified: Secondary | ICD-10-CM | POA: Diagnosis not present

## 2023-01-14 LAB — CBC WITH DIFFERENTIAL (CANCER CENTER ONLY)
Abs Immature Granulocytes: 0.02 10*3/uL (ref 0.00–0.07)
Basophils Absolute: 0 10*3/uL (ref 0.0–0.1)
Basophils Relative: 0 %
Eosinophils Absolute: 0 10*3/uL (ref 0.0–0.5)
Eosinophils Relative: 1 %
HCT: 32.5 % — ABNORMAL LOW (ref 36.0–46.0)
Hemoglobin: 11.7 g/dL — ABNORMAL LOW (ref 12.0–15.0)
Immature Granulocytes: 0 %
Lymphocytes Relative: 7 %
Lymphs Abs: 0.4 10*3/uL — ABNORMAL LOW (ref 0.7–4.0)
MCH: 37.6 pg — ABNORMAL HIGH (ref 26.0–34.0)
MCHC: 36 g/dL (ref 30.0–36.0)
MCV: 104.5 fL — ABNORMAL HIGH (ref 80.0–100.0)
Monocytes Absolute: 0.1 10*3/uL (ref 0.1–1.0)
Monocytes Relative: 2 %
Neutro Abs: 5.6 10*3/uL (ref 1.7–7.7)
Neutrophils Relative %: 90 %
Platelet Count: 215 10*3/uL (ref 150–400)
RBC: 3.11 MIL/uL — ABNORMAL LOW (ref 3.87–5.11)
RDW: 14.2 % (ref 11.5–15.5)
WBC Count: 6.2 10*3/uL (ref 4.0–10.5)
nRBC: 0 % (ref 0.0–0.2)

## 2023-01-14 LAB — CMP (CANCER CENTER ONLY)
ALT: 9 U/L (ref 0–44)
AST: 12 U/L — ABNORMAL LOW (ref 15–41)
Albumin: 4.1 g/dL (ref 3.5–5.0)
Alkaline Phosphatase: 137 U/L — ABNORMAL HIGH (ref 38–126)
Anion gap: 7 (ref 5–15)
BUN: 12 mg/dL (ref 8–23)
CO2: 26 mmol/L (ref 22–32)
Calcium: 8.6 mg/dL — ABNORMAL LOW (ref 8.9–10.3)
Chloride: 108 mmol/L (ref 98–111)
Creatinine: 0.68 mg/dL (ref 0.44–1.00)
GFR, Estimated: >60 mL/min (ref >60)
Glucose, Bld: 191 mg/dL — ABNORMAL HIGH (ref 70–99)
Potassium: 3.9 mmol/L (ref 3.5–5.1)
Sodium: 141 mmol/L (ref 135–145)
Total Bilirubin: 0.5 mg/dL (ref 0.3–1.2)
Total Protein: 6 g/dL — ABNORMAL LOW (ref 6.5–8.1)

## 2023-01-14 MED ORDER — BORTEZOMIB CHEMO SQ INJECTION 3.5 MG (2.5MG/ML)
1.3000 mg/m2 | Freq: Once | INTRAMUSCULAR | Status: AC
  Administered 2023-01-14: 2 mg via SUBCUTANEOUS
  Filled 2023-01-14: qty 0.8

## 2023-01-14 NOTE — Patient Instructions (Signed)
[facility] [facility]  Discharge Instructions: Thank you for choosing [facility] to provide your oncology and hematology care.   If you have a lab appointment with the Cancer Center, please go directly to the Cancer Center and check in at the registration area.   Wear comfortable clothing and clothing appropriate for easy access to any Portacath or PICC line.   We strive to give you quality time with your provider. You may need to reschedule your appointment if you arrive late (15 or more minutes).  Arriving late affects you and other patients whose appointments are after yours.  Also, if you miss three or more appointments without notifying the office, you may be dismissed from the clinic at the provider's discretion.      For prescription refill requests, have your pharmacy contact our office and allow 72 hours for refills to be completed.    Today you received the following chemotherapy and/or immunotherapy agents: Velcade     To help prevent nausea and vomiting after your treatment, we encourage you to take your nausea medication as directed.  BELOW ARE SYMPTOMS THAT SHOULD BE REPORTED IMMEDIATELY: *FEVER GREATER THAN 100.4 F (38 C) OR HIGHER *CHILLS OR SWEATING *NAUSEA AND VOMITING THAT IS NOT CONTROLLED WITH YOUR NAUSEA MEDICATION *UNUSUAL SHORTNESS OF BREATH *UNUSUAL BRUISING OR BLEEDING *URINARY PROBLEMS (pain or burning when urinating, or frequent urination) *BOWEL PROBLEMS (unusual diarrhea, constipation, pain near the anus) TENDERNESS IN MOUTH AND THROAT WITH OR WITHOUT PRESENCE OF ULCERS (sore throat, sores in mouth, or a toothache) UNUSUAL RASH, SWELLING OR PAIN  UNUSUAL VAGINAL DISCHARGE OR ITCHING   Items with * indicate a potential emergency and should be followed up as soon as possible or go to the Emergency Department if any problems should occur.  Please show the CHEMOTHERAPY ALERT CARD or IMMUNOTHERAPY ALERT CARD at check-in  to the Emergency Department and triage nurse.  Should you have questions after your visit or need to cancel or reschedule your appointment, please contact [facility]  Dept: [phone]  and follow the prompts.  Office hours are 8:00 a.m. to 4:30 p.m. Monday - Friday. Please note that voicemails left after 4:00 p.m. may not be returned until the following business day.  We are closed weekends and major holidays. You have access to a nurse at all times for urgent questions. Please call the main number to the clinic Dept: [phone] and follow the prompts.   For any non-urgent questions, you may also contact your provider using MyChart. We now offer e-Visits for anyone 18 and older to request care online for non-urgent symptoms. For details visit [email or web address].   Also download the MyChart app! Go to the app store, search "MyChart", open the app, select , and log in with your MyChart username and password.   

## 2023-01-14 NOTE — Progress Notes (Signed)
[facility] Spiritual Care Note  Followed up with Alice Anthony and her daughter Alice Anthony. Alice Anthony is using perspective and gratitude to cope with pain and bowel discomfort. Her prayer request was not for herself.  Provided empathic listening, normalization of feelings, emotional support, and encouragement. Continuing to follow at treatments, and Alice Anthony knows to reach out as needed/desired as well.   [address] Rush Barer, [place], [facility] Pager [phone] Voicemail [phone]

## 2023-01-14 NOTE — Progress Notes (Signed)
Patient states she took her dexamethasone at home this morning.

## 2023-01-15 ENCOUNTER — Inpatient Hospital Stay: Payer: Medicare Other | Admitting: Licensed Clinical Social Worker

## 2023-01-15 ENCOUNTER — Encounter: Payer: Self-pay | Admitting: Hematology and Oncology

## 2023-01-15 DIAGNOSIS — C9000 Multiple myeloma not having achieved remission: Secondary | ICD-10-CM

## 2023-01-15 NOTE — Progress Notes (Signed)
[facility] Clinical Social Work  Initial Assessment   Alice Anthony is a 82 y.o. year old female contacted by phone. Clinical Social Work was referred by medical provider for assessment of psychosocial needs.   SDOH (Social Determinants of Health) assessments performed: Yes   SDOH Screenings   Tobacco Use: Low Risk  (01/14/2023)     Distress Screen completed: No     No data to display            Family/Social Information:  Housing Arrangement: patient lives alone - Pt is independent in ADLs, but is still reliant on a walker for ambulation. Family members/support persons in your life? Pt has 4 children who reside locally that check on pt daily.  Pt states she is rarely in the home alone as someone is generally there to assist her.   Transportation concerns: no  Employment: Retired .  Income source: Actor concerns: No Type of concern: None Food access concerns: no Religious or spiritual practice: Data processing manager Currently in place:  pt has home care at present and is working with PT once a week.  Coping/ Adjustment to diagnosis: Patient understands treatment plan and what happens next? yes Concerns about diagnosis and/or treatment: Overwhelmed by information, How will I care for myself, and Quality of life Patient reported stressors: Anxiety/ nervousness and Adjusting to my illness Hopes and/or priorities: Pt's priority is to continue treatment w/ the hope of positive results.   Patient enjoys time with family/ friends Current coping skills/ strengths: Motivation for treatment/growth , Religious Affiliation , and Supportive family/friends     SUMMARY: Current SDOH Barriers:  No barriers identified at this time.  Clinical Social Work Clinical Goal(s):  No clinical social work goals at this time  Interventions: Discussed common feeling and emotions when being diagnosed with cancer, and the importance of support during treatment Informed  patient of the support team roles and support services at [facility] Provided CSW contact information and encouraged patient to call with any questions or concerns Pt is connected to the chaplan and is appreciative of continued emotional support from the chaplan.  Pt states initially she had a great deal of anxiety as she was trying to process her diagnosis, but feels she is in a better place now that treatment has started.  Pt has contact information for CSW should wish for additional support.     Follow Up Plan: Patient will contact CSW with any support or resource needs Patient verbalizes understanding of plan: Yes    Rachel Moulds, LCSW Clinical Social Worker [facility]

## 2023-01-20 DIAGNOSIS — K5641 Fecal impaction: Secondary | ICD-10-CM | POA: Diagnosis not present

## 2023-01-20 DIAGNOSIS — F419 Anxiety disorder, unspecified: Secondary | ICD-10-CM | POA: Diagnosis not present

## 2023-01-20 DIAGNOSIS — C9030 Solitary plasmacytoma not having achieved remission: Secondary | ICD-10-CM | POA: Diagnosis not present

## 2023-01-20 DIAGNOSIS — M8458XD Pathological fracture in neoplastic disease, other specified site, subsequent encounter for fracture with routine healing: Secondary | ICD-10-CM | POA: Diagnosis not present

## 2023-01-20 DIAGNOSIS — Z79899 Other long term (current) drug therapy: Secondary | ICD-10-CM | POA: Diagnosis not present

## 2023-01-20 DIAGNOSIS — M1990 Unspecified osteoarthritis, unspecified site: Secondary | ICD-10-CM | POA: Diagnosis not present

## 2023-01-21 ENCOUNTER — Encounter: Payer: Self-pay | Admitting: General Practice

## 2023-01-21 ENCOUNTER — Inpatient Hospital Stay: Payer: Medicare Other

## 2023-01-21 VITALS — BP 123/65 | HR 90 | Resp 16 | Wt 122.0 lb

## 2023-01-21 DIAGNOSIS — K5903 Drug induced constipation: Secondary | ICD-10-CM | POA: Diagnosis not present

## 2023-01-21 DIAGNOSIS — J45909 Unspecified asthma, uncomplicated: Secondary | ICD-10-CM | POA: Diagnosis not present

## 2023-01-21 DIAGNOSIS — K219 Gastro-esophageal reflux disease without esophagitis: Secondary | ICD-10-CM | POA: Diagnosis not present

## 2023-01-21 DIAGNOSIS — Z79891 Long term (current) use of opiate analgesic: Secondary | ICD-10-CM | POA: Diagnosis not present

## 2023-01-21 DIAGNOSIS — Z7984 Long term (current) use of oral hypoglycemic drugs: Secondary | ICD-10-CM | POA: Diagnosis not present

## 2023-01-21 DIAGNOSIS — R7303 Prediabetes: Secondary | ICD-10-CM | POA: Diagnosis not present

## 2023-01-21 DIAGNOSIS — Z79899 Other long term (current) drug therapy: Secondary | ICD-10-CM | POA: Diagnosis not present

## 2023-01-21 DIAGNOSIS — K5790 Diverticulosis of intestine, part unspecified, without perforation or abscess without bleeding: Secondary | ICD-10-CM | POA: Diagnosis not present

## 2023-01-21 DIAGNOSIS — K5641 Fecal impaction: Secondary | ICD-10-CM | POA: Diagnosis not present

## 2023-01-21 DIAGNOSIS — C9000 Multiple myeloma not having achieved remission: Secondary | ICD-10-CM

## 2023-01-21 DIAGNOSIS — M1990 Unspecified osteoarthritis, unspecified site: Secondary | ICD-10-CM | POA: Diagnosis not present

## 2023-01-21 DIAGNOSIS — F419 Anxiety disorder, unspecified: Secondary | ICD-10-CM | POA: Diagnosis not present

## 2023-01-21 DIAGNOSIS — Z5112 Encounter for antineoplastic immunotherapy: Secondary | ICD-10-CM | POA: Diagnosis not present

## 2023-01-21 DIAGNOSIS — M8458XD Pathological fracture in neoplastic disease, other specified site, subsequent encounter for fracture with routine healing: Secondary | ICD-10-CM | POA: Diagnosis not present

## 2023-01-21 DIAGNOSIS — C9030 Solitary plasmacytoma not having achieved remission: Secondary | ICD-10-CM | POA: Diagnosis not present

## 2023-01-21 DIAGNOSIS — I10 Essential (primary) hypertension: Secondary | ICD-10-CM | POA: Diagnosis not present

## 2023-01-21 DIAGNOSIS — E785 Hyperlipidemia, unspecified: Secondary | ICD-10-CM | POA: Diagnosis not present

## 2023-01-21 DIAGNOSIS — Z87442 Personal history of urinary calculi: Secondary | ICD-10-CM | POA: Diagnosis not present

## 2023-01-21 DIAGNOSIS — Z7982 Long term (current) use of aspirin: Secondary | ICD-10-CM | POA: Diagnosis not present

## 2023-01-21 DIAGNOSIS — Z85828 Personal history of other malignant neoplasm of skin: Secondary | ICD-10-CM | POA: Diagnosis not present

## 2023-01-21 LAB — CMP (CANCER CENTER ONLY)
ALT: 11 U/L (ref 0–44)
AST: 14 U/L — ABNORMAL LOW (ref 15–41)
Albumin: 4.1 g/dL (ref 3.5–5.0)
Alkaline Phosphatase: 155 U/L — ABNORMAL HIGH (ref 38–126)
Anion gap: 9 (ref 5–15)
BUN: 17 mg/dL (ref 8–23)
CO2: 22 mmol/L (ref 22–32)
Calcium: 8.9 mg/dL (ref 8.9–10.3)
Chloride: 106 mmol/L (ref 98–111)
Creatinine: 0.6 mg/dL (ref 0.44–1.00)
GFR, Estimated: >60 mL/min (ref >60)
Glucose, Bld: 242 mg/dL — ABNORMAL HIGH (ref 70–99)
Potassium: 3.9 mmol/L (ref 3.5–5.1)
Sodium: 137 mmol/L (ref 135–145)
Total Bilirubin: 0.7 mg/dL (ref 0.3–1.2)
Total Protein: 5.9 g/dL — ABNORMAL LOW (ref 6.5–8.1)

## 2023-01-21 LAB — CBC WITH DIFFERENTIAL (CANCER CENTER ONLY)
Abs Immature Granulocytes: 0.02 10*3/uL (ref 0.00–0.07)
Basophils Absolute: 0 10*3/uL (ref 0.0–0.1)
Basophils Relative: 0 %
Eosinophils Absolute: 0 10*3/uL (ref 0.0–0.5)
Eosinophils Relative: 0 %
HCT: 34.1 % — ABNORMAL LOW (ref 36.0–46.0)
Hemoglobin: 12.1 g/dL (ref 12.0–15.0)
Immature Granulocytes: 0 %
Lymphocytes Relative: 6 %
Lymphs Abs: 0.3 10*3/uL — ABNORMAL LOW (ref 0.7–4.0)
MCH: 36.7 pg — ABNORMAL HIGH (ref 26.0–34.0)
MCHC: 35.5 g/dL (ref 30.0–36.0)
MCV: 103.3 fL — ABNORMAL HIGH (ref 80.0–100.0)
Monocytes Absolute: 0.1 10*3/uL (ref 0.1–1.0)
Monocytes Relative: 1 %
Neutro Abs: 4.2 10*3/uL (ref 1.7–7.7)
Neutrophils Relative %: 93 %
Platelet Count: 193 10*3/uL (ref 150–400)
RBC: 3.3 MIL/uL — ABNORMAL LOW (ref 3.87–5.11)
RDW: 13.2 % (ref 11.5–15.5)
WBC Count: 4.6 10*3/uL (ref 4.0–10.5)
nRBC: 0 % (ref 0.0–0.2)

## 2023-01-21 LAB — LACTATE DEHYDROGENASE: LDH: 203 U/L — ABNORMAL HIGH (ref 98–192)

## 2023-01-21 MED ORDER — DEXAMETHASONE 4 MG PO TABS
40.0000 mg | ORAL_TABLET | Freq: Once | ORAL | Status: DC
Start: 2023-01-21 — End: 2023-01-21

## 2023-01-21 MED ORDER — BORTEZOMIB CHEMO SQ INJECTION 3.5 MG (2.5MG/ML)
1.3000 mg/m2 | Freq: Once | INTRAMUSCULAR | Status: AC
  Administered 2023-01-21: 2 mg via SUBCUTANEOUS
  Filled 2023-01-21: qty 0.8

## 2023-01-21 NOTE — Progress Notes (Signed)
Pt took dex at home prior to appointment time

## 2023-01-21 NOTE — Patient Instructions (Signed)
[place]  Discharge Instructions: Thank you for choosing [facility] to provide your oncology and hematology care.   If you have a lab appointment with the Cancer Center, please go directly to the Cancer Center and check in at the registration area.   Wear comfortable clothing and clothing appropriate for easy access to any Portacath or PICC line.   We strive to give you quality time with your provider. You may need to reschedule your appointment if you arrive late (15 or more minutes).  Arriving late affects you and other patients whose appointments are after yours.  Also, if you miss three or more appointments without notifying the office, you may be dismissed from the clinic at the provider's discretion.      For prescription refill requests, have your pharmacy contact our office and allow 72 hours for refills to be completed.    Today you received the following chemotherapy and/or immunotherapy agents: Velcade     To help prevent nausea and vomiting after your treatment, we encourage you to take your nausea medication as directed.  BELOW ARE SYMPTOMS THAT SHOULD BE REPORTED IMMEDIATELY: *FEVER GREATER THAN 100.4 F (38 C) OR HIGHER *CHILLS OR SWEATING *NAUSEA AND VOMITING THAT IS NOT CONTROLLED WITH YOUR NAUSEA MEDICATION *UNUSUAL SHORTNESS OF BREATH *UNUSUAL BRUISING OR BLEEDING *URINARY PROBLEMS (pain or burning when urinating, or frequent urination) *BOWEL PROBLEMS (unusual diarrhea, constipation, pain near the anus) TENDERNESS IN MOUTH AND THROAT WITH OR WITHOUT PRESENCE OF ULCERS (sore throat, sores in mouth, or a toothache) UNUSUAL RASH, SWELLING OR PAIN  UNUSUAL VAGINAL DISCHARGE OR ITCHING   Items with * indicate a potential emergency and should be followed up as soon as possible or go to the Emergency Department if any problems should occur.  Please show the CHEMOTHERAPY ALERT CARD or IMMUNOTHERAPY ALERT CARD at check-in  to the Emergency Department and triage nurse.  Should you have questions after your visit or need to cancel or reschedule your appointment, please contact [facility] [facility]  Dept: [phone]  and follow the prompts.  Office hours are 8:00 a.m. to 4:30 p.m. Monday - Friday. Please note that voicemails left after 4:00 p.m. may not be returned until the following business day.  We are closed weekends and major holidays. You have access to a nurse at all times for urgent questions. Please call the main number to the clinic Dept: [phone] and follow the prompts.   For any non-urgent questions, you may also contact your provider using MyChart. We now offer e-Visits for anyone 18 and older to request care online for non-urgent symptoms. For details visit [email or web address].   Also download the MyChart app! Go to the app store, search "MyChart", open the app, select [place], and log in with your MyChart username and password.   

## 2023-01-21 NOTE — Progress Notes (Signed)
[facility] Spiritual Care Note  Followed up with Ms Halland in infusion. She reports that she is making incremental progress in managing her pain, her anxiety about her pain, and more independent ADLs, which feels relieving and gratifying. She also shared about other prayer concerns. Prayer is a central part of her meaning-making and coping.  Will continue to follow through infusion visits.   [address] Rush Barer, [place], [facility] Pager [phone] Voicemail [phone]

## 2023-01-23 ENCOUNTER — Other Ambulatory Visit: Payer: Self-pay | Admitting: Nurse Practitioner

## 2023-01-23 DIAGNOSIS — G893 Neoplasm related pain (acute) (chronic): Secondary | ICD-10-CM

## 2023-01-23 DIAGNOSIS — C9000 Multiple myeloma not having achieved remission: Secondary | ICD-10-CM

## 2023-01-23 DIAGNOSIS — Z515 Encounter for palliative care: Secondary | ICD-10-CM

## 2023-01-23 NOTE — Telephone Encounter (Signed)
Refill sent per patient's request. I have reviewed the PDMP during this encounter.

## 2023-01-25 DIAGNOSIS — C9030 Solitary plasmacytoma not having achieved remission: Secondary | ICD-10-CM | POA: Diagnosis not present

## 2023-01-25 DIAGNOSIS — K5641 Fecal impaction: Secondary | ICD-10-CM | POA: Diagnosis not present

## 2023-01-25 DIAGNOSIS — F419 Anxiety disorder, unspecified: Secondary | ICD-10-CM | POA: Diagnosis not present

## 2023-01-25 DIAGNOSIS — M8458XD Pathological fracture in neoplastic disease, other specified site, subsequent encounter for fracture with routine healing: Secondary | ICD-10-CM | POA: Diagnosis not present

## 2023-01-25 DIAGNOSIS — Z79899 Other long term (current) drug therapy: Secondary | ICD-10-CM | POA: Diagnosis not present

## 2023-01-25 DIAGNOSIS — M1990 Unspecified osteoarthritis, unspecified site: Secondary | ICD-10-CM | POA: Diagnosis not present

## 2023-01-28 ENCOUNTER — Other Ambulatory Visit: Payer: Self-pay | Admitting: Hematology and Oncology

## 2023-01-28 ENCOUNTER — Inpatient Hospital Stay (HOSPITAL_BASED_OUTPATIENT_CLINIC_OR_DEPARTMENT_OTHER): Payer: Medicare Other | Admitting: Hematology and Oncology

## 2023-01-28 ENCOUNTER — Inpatient Hospital Stay: Payer: Medicare Other

## 2023-01-28 ENCOUNTER — Other Ambulatory Visit: Payer: Self-pay

## 2023-01-28 VITALS — BP 143/74 | HR 94 | Temp 97.9°F | Resp 16 | Wt 121.0 lb

## 2023-01-28 DIAGNOSIS — G893 Neoplasm related pain (acute) (chronic): Secondary | ICD-10-CM | POA: Diagnosis not present

## 2023-01-28 DIAGNOSIS — Z79899 Other long term (current) drug therapy: Secondary | ICD-10-CM | POA: Diagnosis not present

## 2023-01-28 DIAGNOSIS — C9000 Multiple myeloma not having achieved remission: Secondary | ICD-10-CM

## 2023-01-28 DIAGNOSIS — Z5112 Encounter for antineoplastic immunotherapy: Secondary | ICD-10-CM | POA: Diagnosis not present

## 2023-01-28 LAB — CMP (CANCER CENTER ONLY)
ALT: 10 U/L (ref 0–44)
AST: 12 U/L — ABNORMAL LOW (ref 15–41)
Albumin: 4 g/dL (ref 3.5–5.0)
Alkaline Phosphatase: 141 U/L — ABNORMAL HIGH (ref 38–126)
Anion gap: 8 (ref 5–15)
BUN: 9 mg/dL (ref 8–23)
CO2: 26 mmol/L (ref 22–32)
Calcium: 9.4 mg/dL (ref 8.9–10.3)
Chloride: 105 mmol/L (ref 98–111)
Creatinine: 0.61 mg/dL (ref 0.44–1.00)
GFR, Estimated: >60 mL/min (ref >60)
Glucose, Bld: 166 mg/dL — ABNORMAL HIGH (ref 70–99)
Potassium: 4.1 mmol/L (ref 3.5–5.1)
Sodium: 139 mmol/L (ref 135–145)
Total Bilirubin: 0.6 mg/dL (ref 0.3–1.2)
Total Protein: 5.9 g/dL — ABNORMAL LOW (ref 6.5–8.1)

## 2023-01-28 LAB — CBC WITH DIFFERENTIAL (CANCER CENTER ONLY)
Abs Immature Granulocytes: 0.01 10*3/uL (ref 0.00–0.07)
Basophils Absolute: 0 10*3/uL (ref 0.0–0.1)
Basophils Relative: 0 %
Eosinophils Absolute: 0 10*3/uL (ref 0.0–0.5)
Eosinophils Relative: 1 %
HCT: 35.8 % — ABNORMAL LOW (ref 36.0–46.0)
Hemoglobin: 12.2 g/dL (ref 12.0–15.0)
Immature Granulocytes: 0 %
Lymphocytes Relative: 6 %
Lymphs Abs: 0.3 10*3/uL — ABNORMAL LOW (ref 0.7–4.0)
MCH: 35.8 pg — ABNORMAL HIGH (ref 26.0–34.0)
MCHC: 34.1 g/dL (ref 30.0–36.0)
MCV: 105 fL — ABNORMAL HIGH (ref 80.0–100.0)
Monocytes Absolute: 0.1 10*3/uL (ref 0.1–1.0)
Monocytes Relative: 2 %
Neutro Abs: 4.5 10*3/uL (ref 1.7–7.7)
Neutrophils Relative %: 91 %
Platelet Count: 182 10*3/uL (ref 150–400)
RBC: 3.41 MIL/uL — ABNORMAL LOW (ref 3.87–5.11)
RDW: 13 % (ref 11.5–15.5)
WBC Count: 5 10*3/uL (ref 4.0–10.5)
nRBC: 0 % (ref 0.0–0.2)

## 2023-01-28 MED ORDER — BORTEZOMIB CHEMO SQ INJECTION 3.5 MG (2.5MG/ML)
1.3000 mg/m2 | Freq: Once | INTRAMUSCULAR | Status: AC
  Administered 2023-01-28: 2 mg via SUBCUTANEOUS
  Filled 2023-01-28: qty 0.8

## 2023-01-28 NOTE — Progress Notes (Signed)
[facility] [phone]   [phone]  PROGRESS NOTE  Patient Care Team: [name], MD as PCP - General (Family Medicine) Pickenpack-Cousar, [name], NP as Nurse Practitioner (Nurse Practitioner)  Hematological/Oncological History # Plasmacytoma of Thoracic Spine  # IgG Kappa Multiple myeloma 09/22/2022: Patient underwent MRI thoracic spine which showed acute or subacute T7, T10, and T11 compression fractures 10/10/2022: biopsy of compression fracture of T11 reveals a plasmacytoma 11/13/2022: establish care with Dr. Leonides Schanz  12/06/2022: bone marrow biopsy showed plasma cell myeloma involving 80% of the marrow.  12/24/2022: Cycle 1 Day 1 of Velcade/Dex 01/14/2023: Cycle 2 Day 1 of Velcade/Dex  Interval History:  Alice Anthony 82 y.o. female with medical history significant for newly diagnosed IgG kappa multiple myeloma who presents for a follow up visit. The patient's last visit was on 01/14/2023.  In the interim since the last visit she has continued on Velcade therapy.   On exam today Alice Anthony reports that she was taking a shower earlier this week and dropped the soap.  She notes that as she was getting out of the shower she slipped and unfortunately stretched in uncomfortable fashion.  Fortunately she did not hit her head.  She notes that she takes her tramadol sparingly as it does cause constipation.  She notes that she does have muscle relaxers on hand and would like to take them for her current pain.  She is taking a Xanax currently nightly.  She notes that she is not having any major side effects as result of the chemotherapy.  She notes her energy levels are good and she is not having any numbness or tingling of her fingers or toes..  Overall she is tolerating treatment well and is willing and able to continue at this time.  She denies any fevers, chills, sweats, nausea, vomiting or diarrhea.  A full 10 point ROS is otherwise negative.   MEDICAL  HISTORY:  Past Medical History:  Diagnosis Date   Anxiety    Arthritis    Asthma    Hx: of   Cancer (HCC)    Hx: of skin cancer   Diverticulosis    GERD (gastroesophageal reflux disease)    Hx: of   History of kidney stones    Hyperlipemia    Hypertension    Pre-diabetes     SURGICAL HISTORY: Past Surgical History:  Procedure Laterality Date   ABDOMINAL SURGERY  09/19/2012   Ruptured Diverticuli, Ostomy Placed.   BREAST BIOPSY  06/06/2012   breast biopsy   CATARACT EXTRACTION W/ INTRAOCULAR LENS IMPLANT Bilateral    COLONOSCOPY  03/06/2012   COLOSTOMY CLOSURE  12/25/2012   Dr Luisa Hart   COLOSTOMY CLOSURE N/A 12/25/2012   Procedure: COLOSTOMY CLOSURE;  Surgeon: Clovis Pu. Cornett, MD;  Location: [facility] OR;  Service: General;  Laterality: N/A;   EXTRACORPOREAL SHOCK WAVE LITHOTRIPSY Left 11/17/2018   Procedure: EXTRACORPOREAL SHOCK WAVE LITHOTRIPSY (ESWL);  Surgeon: Malen Gauze, MD;  Location: [facility] ORS;  Service: Urology;  Laterality: Left;   EYE SURGERY  10/19/1998   Hx: of cyst removed from left eye   KYPHOPLASTY N/A 10/10/2022   Procedure: KYPHOPLASTY Thoracic seven , Thoracic ten,Thoracic eleven;  Surgeon: Tressie Stalker, MD;  Location: [facility] OR;  Service: Neurosurgery;  Laterality: N/A;   LAPAROTOMY N/A 09/19/2012   Procedure: EXPLORATORY LAPAROTOMY, sigmoid colectomy;  Surgeon: Clovis Pu. Cornett, MD;  Location: [facility] ORS;  Service: General;  Laterality: N/A;   SIGMOIDOSCOPY  01/11/1999   TONSILLECTOMY  TRIGGER FINGER RELEASE  12/25/2010   TUBAL LIGATION  10/16/1985    SOCIAL HISTORY: Social History   Socioeconomic History   Marital status: Widowed    Spouse name: Not on file   Number of children: Not on file   Years of education: Not on file   Highest education level: Not on file  Occupational History   Not on file  Tobacco Use   Smoking status: Never   Smokeless tobacco: Never  Vaping Use   Vaping Use: Never used  Substance and Sexual Activity   Alcohol  use: No   Drug use: No   Sexual activity: Not Currently  Other Topics Concern   Not on file  Social History Narrative   Not on file   Social Determinants of Health   Financial Resource Strain: Not on file  Food Insecurity: Not on file  Transportation Needs: Not on file  Physical Activity: Not on file  Stress: Not on file  Social Connections: Not on file  Intimate Partner Violence: Not on file    FAMILY HISTORY: Family History  Problem Relation Age of Onset   Diabetes Father    Alzheimer's disease Father    Basal cell carcinoma Mother        Metastatic    ALLERGIES:  is allergic to vioxx [rofecoxib], codeine, doxycycline hyclate, hydrocodone, and latex.  MEDICATIONS:  Current Outpatient Medications  Medication Sig Dispense Refill   acetaminophen (TYLENOL) 500 MG tablet Take 1,000 mg by mouth every 6 (six) hours as needed for moderate pain.     acyclovir (ZOVIRAX) 400 MG tablet Take 1 tablet (400 mg total) by mouth 2 (two) times daily. 180 tablet 1   ALPRAZolam (XANAX) 0.25 MG tablet Take 0.25 mg by mouth 3 (three) times daily as needed.     amLODipine (NORVASC) 5 MG tablet Take 5 mg by mouth daily.     aspirin 81 MG tablet Take 81 mg by mouth daily.     cetirizine (ZYRTEC) 10 MG tablet Take 10 mg by mouth at bedtime.     Cholecalciferol (VITAMIN D3) 2000 UNITS capsule Take 2,000 Units by mouth daily.     dexamethasone (DECADRON) 4 MG tablet Take 40 mg (10 tablets) by mouth once a week. 40 tablet 1   dextromethorphan-guaiFENesin (TUSSIN DM) 10-100 MG/5ML liquid Take 10 mLs by mouth every 4 (four) hours as needed for cough.     fluticasone (FLONASE) 50 MCG/ACT nasal spray Place 2 sprays into the nose at bedtime.     glimepiride (AMARYL) 2 MG tablet Take 1-2 mg by mouth daily as needed (high blood sugar). (Patient not taking: Reported on 01/14/2023)     hydrOXYzine (ATARAX) 25 MG tablet Take 25 mg by mouth at bedtime.     metoCLOPramide (REGLAN) 10 MG tablet Take 1 tablet (10  mg total) by mouth every 6 (six) hours. 60 tablet 0   prochlorperazine (COMPAZINE) 10 MG tablet Take 1 tablet (10 mg total) by mouth every 6 (six) hours as needed for nausea or vomiting. 30 tablet 0   simvastatin (ZOCOR) 20 MG tablet Take 20 mg by mouth every evening.     traMADol (ULTRAM) 50 MG tablet TAKE 1 TO 2 TABLETS BY MOUTH EVERY 4 HOURS AS NEEDED 90 tablet 0   No current facility-administered medications for this visit.    REVIEW OF SYSTEMS:   Constitutional: ( - ) fevers, ( - )  chills , ( - ) night sweats Eyes: ( - ) blurriness  of vision, ( - ) double vision, ( - ) watery eyes Ears, nose, mouth, throat, and face: ( - ) mucositis, ( - ) sore throat Respiratory: ( - ) cough, ( - ) dyspnea, ( - ) wheezes Cardiovascular: ( - ) palpitation, ( - ) chest discomfort, ( - ) lower extremity swelling Gastrointestinal:  ( - ) nausea, ( - ) heartburn, ( - ) change in bowel habits Skin: ( - ) abnormal skin rashes Lymphatics: ( - ) new lymphadenopathy, ( - ) easy bruising Neurological: ( - ) numbness, ( - ) tingling, ( - ) new weaknesses Behavioral/Psych: ( - ) mood change, ( - ) new changes  All other systems were reviewed with the patient and are negative.  PHYSICAL EXAMINATION: ECOG PERFORMANCE STATUS: 1 - Symptomatic but completely ambulatory  There were no vitals filed for this visit.   There were no vitals filed for this visit.   GENERAL: Well-appearing elderly Caucasian female, alert, no distress and comfortable SKIN: skin color, texture, turgor are normal, no rashes or significant lesions EYES: conjunctiva are pink and non-injected, sclera clear LUNGS: clear to auscultation and percussion with normal breathing effort HEART: regular rate & rhythm and no murmurs and no lower extremity edema Musculoskeletal: no cyanosis of digits and no clubbing  PSYCH: alert & oriented x 3, fluent speech NEURO: no focal motor/sensory deficits  LABORATORY DATA:  I have reviewed the data as  listed    Latest Ref Rng & Units 01/21/2023    1:24 PM 01/14/2023    9:25 AM 01/07/2023    2:18 PM  CBC  WBC 4.0 - 10.5 K/uL 4.6  6.2  3.4   Hemoglobin 12.0 - 15.0 g/dL 27.2  53.6  64.4   Hematocrit 36.0 - 46.0 % 34.1  32.5  33.2   Platelets 150 - 400 K/uL 193  215  141        Latest Ref Rng & Units 01/21/2023    1:24 PM 01/14/2023    9:25 AM 01/07/2023    2:18 PM  CMP  Glucose 70 - 99 mg/dL 034  742  595   BUN 8 - 23 mg/dL 17  12  14    Creatinine 0.44 - 1.00 mg/dL 6.38  7.56  4.33   Sodium 135 - 145 mmol/L 137  141  137   Potassium 3.5 - 5.1 mmol/L 3.9  3.9  3.9   Chloride 98 - 111 mmol/L 106  108  104   CO2 22 - 32 mmol/L 22  26  24    Calcium 8.9 - 10.3 mg/dL 8.9  8.6  9.5   Total Protein 6.5 - 8.1 g/dL 5.9  6.0  6.5   Total Bilirubin 0.3 - 1.2 mg/dL 0.7  0.5  0.8   Alkaline Phos 38 - 126 U/L 155  137  119   AST 15 - 41 U/L 14  12  13    ALT 0 - 44 U/L 11  9  13      Lab Results  Component Value Date   MPROTEIN 0.1 (H) 11/14/2022   Lab Results  Component Value Date   KPAFRELGTCHN 4,793.0 (H) 11/14/2022   LAMBDASER 2.6 (L) 11/14/2022   KAPLAMBRATIO 735.45 (H) 11/29/2022   KAPLAMBRATIO 1,843.46 (H) 11/14/2022    RADIOGRAPHIC STUDIES: No results found.  ASSESSMENT & PLAN Alice Anthony 82 y.o. female with medical history significant for newly diagnosed IgG kappa multiple myeloma who presents for a follow up visit.   #  IgG Kappa Multiple Myeloma # Plasmacytoma of Spine  -- Patient diagnosed with plasmacytoma of the spine, subsequent bone marrow biopsy confirmed an IgG kappa multiple myeloma involving the bone marrow --start of Velcade/Dex Cycle 1 Day 1 on 12/24/2022. Plan: -- Labs today show creatinine 0.61, white blood cell 5.0, hemoglobin 12.2, MCV 105, and platelets of 182 -- Okay to proceed with treatment.  Cycle 2 day 15 --Can consider the addition of Revlimid to the patient's treatment regimen at a later time. -- Return to clinic in 2 weeks with interval 1 week  treatments.  #Supportive Care -- chemotherapy education complete -- port placement not required   -- zofran 8mg  q8H PRN and compazine 10mg  PO q6H for nausea -- acyclovir 400mg  PO BID for VCZ prophylaxis -- Recieved dental clearance prior to the start of Xgeva/Zometa. First dose on 01/07/2023.  -- Tramadol 50 mg every 6 hours as needed for pain control   No orders of the defined types were placed in this encounter.   All questions were answered. The patient knows to call the clinic with any problems, questions or concerns.  A total of more than 30 minutes were spent on this encounter with face-to-face time and non-face-to-face time, including preparing to see the patient, ordering tests and/or medications, counseling the patient and coordination of care as outlined above.   Ulysees Barns, MD Department of Hematology/Oncology [facility] Phone: [phone] Pager: [phone] Email: [email or web address]  01/28/2023 7:31 AM

## 2023-01-29 LAB — KAPPA/LAMBDA LIGHT CHAINS
Kappa free light chain: 714.1 mg/L — ABNORMAL HIGH (ref 3.3–19.4)
Kappa, lambda light chain ratio: 246.24 — ABNORMAL HIGH (ref 0.26–1.65)
Lambda free light chains: 2.9 mg/L — ABNORMAL LOW (ref 5.7–26.3)

## 2023-01-30 DIAGNOSIS — M1990 Unspecified osteoarthritis, unspecified site: Secondary | ICD-10-CM | POA: Diagnosis not present

## 2023-01-30 DIAGNOSIS — K5641 Fecal impaction: Secondary | ICD-10-CM | POA: Diagnosis not present

## 2023-01-30 DIAGNOSIS — C9030 Solitary plasmacytoma not having achieved remission: Secondary | ICD-10-CM | POA: Diagnosis not present

## 2023-01-30 DIAGNOSIS — Z79899 Other long term (current) drug therapy: Secondary | ICD-10-CM | POA: Diagnosis not present

## 2023-01-30 DIAGNOSIS — F419 Anxiety disorder, unspecified: Secondary | ICD-10-CM | POA: Diagnosis not present

## 2023-01-30 DIAGNOSIS — M8458XD Pathological fracture in neoplastic disease, other specified site, subsequent encounter for fracture with routine healing: Secondary | ICD-10-CM | POA: Diagnosis not present

## 2023-02-01 DIAGNOSIS — C9030 Solitary plasmacytoma not having achieved remission: Secondary | ICD-10-CM | POA: Diagnosis not present

## 2023-02-01 DIAGNOSIS — Z79899 Other long term (current) drug therapy: Secondary | ICD-10-CM | POA: Diagnosis not present

## 2023-02-01 DIAGNOSIS — M1990 Unspecified osteoarthritis, unspecified site: Secondary | ICD-10-CM | POA: Diagnosis not present

## 2023-02-01 DIAGNOSIS — K5641 Fecal impaction: Secondary | ICD-10-CM | POA: Diagnosis not present

## 2023-02-01 DIAGNOSIS — F419 Anxiety disorder, unspecified: Secondary | ICD-10-CM | POA: Diagnosis not present

## 2023-02-01 DIAGNOSIS — M8458XD Pathological fracture in neoplastic disease, other specified site, subsequent encounter for fracture with routine healing: Secondary | ICD-10-CM | POA: Diagnosis not present

## 2023-02-01 LAB — MULTIPLE MYELOMA PANEL, SERUM
Albumin SerPl Elph-Mcnc: 3.9 g/dL (ref 2.9–4.4)
Albumin/Glob SerPl: 1.7 (ref 0.7–1.7)
Alpha 1: 0.3 g/dL (ref 0.0–0.4)
Alpha2 Glob SerPl Elph-Mcnc: 0.9 g/dL (ref 0.4–1.0)
B-Globulin SerPl Elph-Mcnc: 0.9 g/dL (ref 0.7–1.3)
Gamma Glob SerPl Elph-Mcnc: 0.2 g/dL — ABNORMAL LOW (ref 0.4–1.8)
Globulin, Total: 2.3 g/dL (ref 2.2–3.9)
IgA: 6 mg/dL — ABNORMAL LOW (ref 64–422)
IgG (Immunoglobin G), Serum: 166 mg/dL — ABNORMAL LOW (ref 586–1602)
IgM (Immunoglobulin M), Srm: 7 mg/dL — ABNORMAL LOW (ref 26–217)
Total Protein ELP: 6.2 g/dL (ref 6.0–8.5)

## 2023-02-04 ENCOUNTER — Other Ambulatory Visit: Payer: Self-pay

## 2023-02-04 ENCOUNTER — Inpatient Hospital Stay: Payer: Medicare Other

## 2023-02-04 ENCOUNTER — Inpatient Hospital Stay: Payer: Medicare Other | Attending: Hematology and Oncology

## 2023-02-04 VITALS — BP 129/64 | HR 109 | Temp 97.5°F | Resp 28 | Wt 123.5 lb

## 2023-02-04 DIAGNOSIS — Z79899 Other long term (current) drug therapy: Secondary | ICD-10-CM | POA: Diagnosis not present

## 2023-02-04 DIAGNOSIS — C9000 Multiple myeloma not having achieved remission: Secondary | ICD-10-CM | POA: Diagnosis not present

## 2023-02-04 DIAGNOSIS — Z5112 Encounter for antineoplastic immunotherapy: Secondary | ICD-10-CM | POA: Diagnosis not present

## 2023-02-04 LAB — CBC WITH DIFFERENTIAL (CANCER CENTER ONLY)
Abs Immature Granulocytes: 0.01 10*3/uL (ref 0.00–0.07)
Basophils Absolute: 0 10*3/uL (ref 0.0–0.1)
Basophils Relative: 0 %
Eosinophils Absolute: 0 10*3/uL (ref 0.0–0.5)
Eosinophils Relative: 0 %
HCT: 38.6 % (ref 36.0–46.0)
Hemoglobin: 13.1 g/dL (ref 12.0–15.0)
Immature Granulocytes: 0 %
Lymphocytes Relative: 5 %
Lymphs Abs: 0.3 10*3/uL — ABNORMAL LOW (ref 0.7–4.0)
MCH: 35.3 pg — ABNORMAL HIGH (ref 26.0–34.0)
MCHC: 33.9 g/dL (ref 30.0–36.0)
MCV: 104 fL — ABNORMAL HIGH (ref 80.0–100.0)
Monocytes Absolute: 0 10*3/uL — ABNORMAL LOW (ref 0.1–1.0)
Monocytes Relative: 1 %
Neutro Abs: 6.1 10*3/uL (ref 1.7–7.7)
Neutrophils Relative %: 94 %
Platelet Count: 228 10*3/uL (ref 150–400)
RBC: 3.71 MIL/uL — ABNORMAL LOW (ref 3.87–5.11)
RDW: 12.3 % (ref 11.5–15.5)
WBC Count: 6.5 10*3/uL (ref 4.0–10.5)
nRBC: 0 % (ref 0.0–0.2)

## 2023-02-04 LAB — CMP (CANCER CENTER ONLY)
ALT: 9 U/L (ref 0–44)
AST: 12 U/L — ABNORMAL LOW (ref 15–41)
Albumin: 4.1 g/dL (ref 3.5–5.0)
Alkaline Phosphatase: 149 U/L — ABNORMAL HIGH (ref 38–126)
Anion gap: 9 (ref 5–15)
BUN: 15 mg/dL (ref 8–23)
CO2: 25 mmol/L (ref 22–32)
Calcium: 8.7 mg/dL — ABNORMAL LOW (ref 8.9–10.3)
Chloride: 105 mmol/L (ref 98–111)
Creatinine: 0.69 mg/dL (ref 0.44–1.00)
GFR, Estimated: >60 mL/min (ref >60)
Glucose, Bld: 246 mg/dL — ABNORMAL HIGH (ref 70–99)
Potassium: 4 mmol/L (ref 3.5–5.1)
Sodium: 139 mmol/L (ref 135–145)
Total Bilirubin: 0.5 mg/dL (ref 0.3–1.2)
Total Protein: 6.5 g/dL (ref 6.5–8.1)

## 2023-02-04 MED ORDER — BORTEZOMIB CHEMO SQ INJECTION 3.5 MG (2.5MG/ML)
1.3000 mg/m2 | Freq: Once | INTRAMUSCULAR | Status: AC
  Administered 2023-02-04: 2 mg via SUBCUTANEOUS
  Filled 2023-02-04: qty 0.8

## 2023-02-04 NOTE — Progress Notes (Signed)
Per Dr. Leonides Schanz, OK to treat with pulse 109.

## 2023-02-04 NOTE — Progress Notes (Signed)
Patient states she took her dexamethasone at home today prior to arriving for her injection.

## 2023-02-04 NOTE — Patient Instructions (Signed)
[facility]  Discharge Instructions: Thank you for choosing [facility] to provide your oncology and hematology care.   If you have a lab appointment with the Cancer Center, please go directly to the Cancer Center and check in at the registration area.   Wear comfortable clothing and clothing appropriate for easy access to any Portacath or PICC line.   We strive to give you quality time with your provider. You may need to reschedule your appointment if you arrive late (15 or more minutes).  Arriving late affects you and other patients whose appointments are after yours.  Also, if you miss three or more appointments without notifying the office, you may be dismissed from the clinic at the provider's discretion.      For prescription refill requests, have your pharmacy contact our office and allow 72 hours for refills to be completed.    Today you received the following chemotherapy and/or immunotherapy agents: Velcade     To help prevent nausea and vomiting after your treatment, we encourage you to take your nausea medication as directed.  BELOW ARE SYMPTOMS THAT SHOULD BE REPORTED IMMEDIATELY: *FEVER GREATER THAN 100.4 F (38 C) OR HIGHER *CHILLS OR SWEATING *NAUSEA AND VOMITING THAT IS NOT CONTROLLED WITH YOUR NAUSEA MEDICATION *UNUSUAL SHORTNESS OF BREATH *UNUSUAL BRUISING OR BLEEDING *URINARY PROBLEMS (pain or burning when urinating, or frequent urination) *BOWEL PROBLEMS (unusual diarrhea, constipation, pain near the anus) TENDERNESS IN MOUTH AND THROAT WITH OR WITHOUT PRESENCE OF ULCERS (sore throat, sores in mouth, or a toothache) UNUSUAL RASH, SWELLING OR PAIN  UNUSUAL VAGINAL DISCHARGE OR ITCHING   Items with * indicate a potential emergency and should be followed up as soon as possible or go to the Emergency Department if any problems should occur.  Please show the CHEMOTHERAPY ALERT CARD or IMMUNOTHERAPY ALERT CARD at check-in  to the Emergency Department and triage nurse.  Should you have questions after your visit or need to cancel or reschedule your appointment, please contact [facility]  Dept: [phone]  and follow the prompts.  Office hours are 8:00 a.m. to 4:30 p.m. Monday - Friday. Please note that voicemails left after 4:00 p.m. may not be returned until the following business day.  We are closed weekends and major holidays. You have access to a nurse at all times for urgent questions. Please call the main number to the clinic Dept: [phone] and follow the prompts.   For any non-urgent questions, you may also contact your provider using MyChart. We now offer e-Visits for anyone 18 and older to request care online for non-urgent symptoms. For details visit [email or web address].   Also download the MyChart app! Go to the app store, search "MyChart", open the app, select [facility], and log in with your MyChart username and password.   

## 2023-02-08 ENCOUNTER — Telehealth: Payer: Self-pay | Admitting: *Deleted

## 2023-02-08 NOTE — Telephone Encounter (Signed)
TCT patient's daughter, [name].  No answer but was able to leave vm message on her identified phone #.  Advised that she had an impressive drop in her Kappa light chains. Her therapy appears to be working very well. We will continue on her current treatment and Karena Addison will be seeing her on 02/12/2023. Advised that [name] could call back if she had any questions to [phone]

## 2023-02-08 NOTE — Telephone Encounter (Signed)
-----   Message from Jaci Standard, MD sent at 02/07/2023  5:06 PM EDT ----- Please let Mrs. Tomczyk know about the impressive drop in her Kappa light chains. Her therapy appears to be working very well. We will continue on her current treatment and Karena Addison will be seeing her on 02/12/2023.   ----- Message ----- From: Leory Plowman, Lab In [place] Sent: 01/28/2023  11:42 AM EDT To: Jaci Standard, MD

## 2023-02-11 NOTE — Progress Notes (Unsigned)
Palliative Medicine [facility]  Telephone:(336) [phone] Fax:(336) [phone]   Name: Alice Anthony Date: 02/11/2023 MRN: [id number]  DOB: 11-Feb-1941  Patient Care Team: [name], MD as PCP - General (Family Medicine) Pickenpack-Cousar, [name], NP as Nurse Practitioner (Nurse Practitioner)    INTERVAL HISTORY: Alice Anthony is a 82 y.o. female with oncologic medical history including recent diagnosis of multiple myeloma (12/2022), DM, and posterior vitreous detachment of left and right eyes. Palliative ask to see for symptom management and goals of care   SOCIAL HISTORY:     reports that she has never smoked. She has never used smokeless tobacco. She reports that she does not drink alcohol and does not use drugs.  ADVANCE DIRECTIVES:  Advanced directives on file  CODE STATUS: Full code  PAST MEDICAL HISTORY: Past Medical History:  Diagnosis Date   Anxiety    Arthritis    Asthma    Hx: of   Cancer (HCC)    Hx: of skin cancer   Diverticulosis    GERD (gastroesophageal reflux disease)    Hx: of   History of kidney stones    Hyperlipemia    Hypertension    Pre-diabetes     ALLERGIES:  is allergic to vioxx [rofecoxib], codeine, doxycycline hyclate, hydrocodone, and latex.  MEDICATIONS:  Current Outpatient Medications  Medication Sig Dispense Refill   acetaminophen (TYLENOL) 500 MG tablet Take 1,000 mg by mouth every 6 (six) hours as needed for moderate pain.     acyclovir (ZOVIRAX) 400 MG tablet Take 1 tablet (400 mg total) by mouth 2 (two) times daily. 180 tablet 1   ALPRAZolam (XANAX) 0.25 MG tablet Take 0.25 mg by mouth 3 (three) times daily as needed.     amLODipine (NORVASC) 5 MG tablet Take 5 mg by mouth daily.     aspirin 81 MG tablet Take 81 mg by mouth daily.     cetirizine (ZYRTEC) 10 MG tablet Take 10 mg by mouth at bedtime.     Cholecalciferol (VITAMIN D3) 2000 UNITS capsule Take 2,000 Units by mouth daily.     dexamethasone  (DECADRON) 4 MG tablet Take 40 mg (10 tablets) by mouth once a week. 40 tablet 1   dextromethorphan-guaiFENesin (TUSSIN DM) 10-100 MG/5ML liquid Take 10 mLs by mouth every 4 (four) hours as needed for cough.     fluticasone (FLONASE) 50 MCG/ACT nasal spray Place 2 sprays into the nose at bedtime.     glimepiride (AMARYL) 2 MG tablet Take 1-2 mg by mouth daily as needed (high blood sugar). (Patient not taking: Reported on 01/14/2023)     hydrOXYzine (ATARAX) 25 MG tablet Take 25 mg by mouth at bedtime.     metoCLOPramide (REGLAN) 10 MG tablet Take 1 tablet (10 mg total) by mouth every 6 (six) hours. 60 tablet 0   prochlorperazine (COMPAZINE) 10 MG tablet Take 1 tablet (10 mg total) by mouth every 6 (six) hours as needed for nausea or vomiting. 30 tablet 0   simvastatin (ZOCOR) 20 MG tablet Take 20 mg by mouth every evening.     traMADol (ULTRAM) 50 MG tablet TAKE 1 TO 2 TABLETS BY MOUTH EVERY 4 HOURS AS NEEDED 90 tablet 0   No current facility-administered medications for this visit.    VITAL SIGNS: There were no vitals taken for this visit. There were no vitals filed for this visit.  Estimated body mass index is 23.34 kg/m as calculated from the following:  Height as of 12/24/22: 5\' 1"  (1.549 m).   Weight as of 02/04/23: 123 lb 8 oz (56 kg).   PERFORMANCE STATUS (ECOG) : 2 - Symptomatic, <50% confined to bed   Physical Exam General: NAD, sitting in recliner  Cardiovascular: regular rate and rhythm Pulmonary: normal breathing pattern  Extremities: no edema, no joint deformities Skin: no rashes Neurological: AAO x4  IMPRESSION: I saw Alice Anthony during her infusion. No acute distress. Family is present. Patient denies nausea, vomiting, or diarrhea. Shares she recently purchased an adjustable bed allowing her raise her head. She feels this has assisted in her pain and constipation challenges. Overall Alice Anthony feels she is doing well.   Neoplasm related pain Alice Anthony reports pain  is well controlled on current regimen. She is tolerating Tramadol 50-100mg .    We discussed continuing current regimen without changes at this time.   Will continue close follow-up.    Nausea  Controlled   3. Constipation Is better controlled with regimen. Challenging at times.   4. Goals of Care  5/20- We discussed her current illness and what it means in the larger context of her on-going co-morbidities. Natural disease trajectory and expectations were discussed.   Patient and daughter are realistic in their understanding of current illness. Is remaining hopeful for stability and improve quality of life.   We discussed Her current illness and what it means in the larger context of Her on-going co-morbidities. Natural disease trajectory and expectations were discussed.  I discussed the importance of continued conversation with family and their medical providers regarding overall plan of care and treatment options, ensuring decisions are within the context of the patients values and GOCs.  PLAN:  Tramadol 50-100 mg every 4-6 hours as needed Senna-S 2 pills twice daily Compazine as needed Reglan every 6 hours Ongoing support and symptom management I will plan to see patient back in 3-4 weeks in collaboration to other oncology appointments.    Patient expressed understanding and was in agreement with this plan. She also understands that She can call the clinic at any time with any questions, concerns, or complaints.   Any controlled substances utilized were prescribed in the context of palliative care. PDMP has been reviewed.    Visit consisted of counseling and education dealing with the complex and emotionally intense issues of symptom management and palliative care in the setting of serious and potentially life-threatening illness.Greater than 50%  of this time was spent counseling and coordinating care related to the above assessment and plan.  Willette Alma,  AGPCNP-BC  Palliative Medicine Team/[facility]  *Please note that this is a verbal dictation therefore any spelling or grammatical errors are due to the "[employer] One" system interpretation.

## 2023-02-12 ENCOUNTER — Inpatient Hospital Stay (HOSPITAL_BASED_OUTPATIENT_CLINIC_OR_DEPARTMENT_OTHER): Payer: Medicare Other | Admitting: Physician Assistant

## 2023-02-12 ENCOUNTER — Inpatient Hospital Stay: Payer: Medicare Other

## 2023-02-12 ENCOUNTER — Other Ambulatory Visit: Payer: Self-pay

## 2023-02-12 ENCOUNTER — Inpatient Hospital Stay (HOSPITAL_BASED_OUTPATIENT_CLINIC_OR_DEPARTMENT_OTHER): Payer: Medicare Other | Admitting: Nurse Practitioner

## 2023-02-12 ENCOUNTER — Encounter: Payer: Self-pay | Admitting: Nurse Practitioner

## 2023-02-12 ENCOUNTER — Other Ambulatory Visit: Payer: Self-pay | Admitting: Physician Assistant

## 2023-02-12 VITALS — HR 106

## 2023-02-12 DIAGNOSIS — C9000 Multiple myeloma not having achieved remission: Secondary | ICD-10-CM

## 2023-02-12 DIAGNOSIS — Z5112 Encounter for antineoplastic immunotherapy: Secondary | ICD-10-CM | POA: Diagnosis not present

## 2023-02-12 DIAGNOSIS — R530 Neoplastic (malignant) related fatigue: Secondary | ICD-10-CM | POA: Diagnosis not present

## 2023-02-12 DIAGNOSIS — Z79899 Other long term (current) drug therapy: Secondary | ICD-10-CM | POA: Diagnosis not present

## 2023-02-12 DIAGNOSIS — Z515 Encounter for palliative care: Secondary | ICD-10-CM

## 2023-02-12 DIAGNOSIS — G893 Neoplasm related pain (acute) (chronic): Secondary | ICD-10-CM | POA: Diagnosis not present

## 2023-02-12 LAB — CBC WITH DIFFERENTIAL (CANCER CENTER ONLY)
Abs Immature Granulocytes: 0.01 10*3/uL (ref 0.00–0.07)
Basophils Absolute: 0 10*3/uL (ref 0.0–0.1)
Basophils Relative: 0 %
Eosinophils Absolute: 0 10*3/uL (ref 0.0–0.5)
Eosinophils Relative: 0 %
HCT: 39.8 % (ref 36.0–46.0)
Hemoglobin: 13.8 g/dL (ref 12.0–15.0)
Immature Granulocytes: 0 %
Lymphocytes Relative: 7 %
Lymphs Abs: 0.2 10*3/uL — ABNORMAL LOW (ref 0.7–4.0)
MCH: 35.4 pg — ABNORMAL HIGH (ref 26.0–34.0)
MCHC: 34.7 g/dL (ref 30.0–36.0)
MCV: 102.1 fL — ABNORMAL HIGH (ref 80.0–100.0)
Monocytes Absolute: 0 10*3/uL — ABNORMAL LOW (ref 0.1–1.0)
Monocytes Relative: 1 %
Neutro Abs: 2.9 10*3/uL (ref 1.7–7.7)
Neutrophils Relative %: 92 %
Platelet Count: 235 10*3/uL (ref 150–400)
RBC: 3.9 MIL/uL (ref 3.87–5.11)
RDW: 12.1 % (ref 11.5–15.5)
Smear Review: NORMAL
WBC Count: 3.2 10*3/uL — ABNORMAL LOW (ref 4.0–10.5)
nRBC: 0 % (ref 0.0–0.2)

## 2023-02-12 LAB — CMP (CANCER CENTER ONLY)
ALT: 8 U/L (ref 0–44)
AST: 11 U/L — ABNORMAL LOW (ref 15–41)
Albumin: 4.2 g/dL (ref 3.5–5.0)
Alkaline Phosphatase: 170 U/L — ABNORMAL HIGH (ref 38–126)
Anion gap: 10 (ref 5–15)
BUN: 14 mg/dL (ref 8–23)
CO2: 23 mmol/L (ref 22–32)
Calcium: 9.1 mg/dL (ref 8.9–10.3)
Chloride: 106 mmol/L (ref 98–111)
Creatinine: 0.79 mg/dL (ref 0.44–1.00)
GFR, Estimated: >60 mL/min (ref >60)
Glucose, Bld: 300 mg/dL — ABNORMAL HIGH (ref 70–99)
Potassium: 4 mmol/L (ref 3.5–5.1)
Sodium: 139 mmol/L (ref 135–145)
Total Bilirubin: 0.6 mg/dL (ref 0.3–1.2)
Total Protein: 6.6 g/dL (ref 6.5–8.1)

## 2023-02-12 MED ORDER — BORTEZOMIB CHEMO SQ INJECTION 3.5 MG (2.5MG/ML)
1.3000 mg/m2 | Freq: Once | INTRAMUSCULAR | Status: AC
  Administered 2023-02-12: 2 mg via SUBCUTANEOUS
  Filled 2023-02-12: qty 0.8

## 2023-02-12 NOTE — Progress Notes (Signed)
[facility] [phone]   [phone]  PROGRESS NOTE  Patient Care Team: Alice Palmer, MD as PCP - General (Family Medicine) Pickenpack-Cousar, Alice Baumgartner, NP as Nurse Practitioner (Nurse Practitioner)  Hematological/Oncological History # Plasmacytoma of Thoracic Spine  # IgG Kappa Multiple myeloma 09/22/2022: Patient underwent MRI thoracic spine which showed acute or subacute T7, T10, and T11 compression fractures 10/10/2022: biopsy of compression fracture of T11 reveals a plasmacytoma 11/13/2022: establish care with Dr. Leonides Schanz  12/06/2022: bone marrow biopsy showed plasma cell myeloma involving 80% of the marrow.  12/24/2022: Cycle 1 Day 1 of Velcade/Dex 01/14/2023: Cycle 2 Day 1 of Velcade/Dex 02/04/2023: Cycle 3 Day 1 of Velcade/Dex  Interval History:  Alice Anthony 82 y.o. female with medical history significant for newly diagnosed IgG kappa multiple myeloma who presents for a follow up visit. The patient's last visit was on 01/28/2023.  In the interim since the last visit she has continued on Velcade therapy.   On exam today Alice Anthony reports that she is slowly improving after starting her Velcade/Dex therapy. She has more energy and has a good appetite. She denies any nausea, vomiting or abdominal pain. Her bowel habits are unchanged and is able to control her chronic constipation. She is still struggling with anxiety and takes Xanax daily. She denies any new or concerning side effects from her chemotherapy.  Overall she is tolerating treatment well and is willing and able to continue at this time.  She denies any fevers, chills, sweats, shortness of breath, chest pain or cough.  A full 10 point ROS is otherwise negative.   MEDICAL HISTORY:  Past Medical History:  Diagnosis Date   Anxiety    Arthritis    Asthma    Hx: of   Cancer (HCC)    Hx: of skin cancer   Diverticulosis    GERD (gastroesophageal reflux disease)    Hx: of   History of  kidney stones    Hyperlipemia    Hypertension    Pre-diabetes     SURGICAL HISTORY: Past Surgical History:  Procedure Laterality Date   ABDOMINAL SURGERY  09/19/2012   Ruptured Diverticuli, Ostomy Placed.   BREAST BIOPSY  06/06/2012   breast biopsy   CATARACT EXTRACTION W/ INTRAOCULAR LENS IMPLANT Bilateral    COLONOSCOPY  03/06/2012   COLOSTOMY CLOSURE  12/25/2012   Dr Luisa Hart   COLOSTOMY CLOSURE N/A 12/25/2012   Procedure: COLOSTOMY CLOSURE;  Surgeon: Clovis Pu. Cornett, MD;  Location: [facility] OR;  Service: General;  Laterality: N/A;   EXTRACORPOREAL SHOCK WAVE LITHOTRIPSY Left 11/17/2018   Procedure: EXTRACORPOREAL SHOCK WAVE LITHOTRIPSY (ESWL);  Surgeon: Malen Gauze, MD;  Location: [facility] ORS;  Service: Urology;  Laterality: Left;   EYE SURGERY  10/19/1998   Hx: of cyst removed from left eye   KYPHOPLASTY N/A 10/10/2022   Procedure: KYPHOPLASTY Thoracic seven , Thoracic ten,Thoracic eleven;  Surgeon: Tressie Stalker, MD;  Location: [facility] OR;  Service: Neurosurgery;  Laterality: N/A;   LAPAROTOMY N/A 09/19/2012   Procedure: EXPLORATORY LAPAROTOMY, sigmoid colectomy;  Surgeon: Clovis Pu. Cornett, MD;  Location: [facility];  Service: General;  Laterality: N/A;   SIGMOIDOSCOPY  01/11/1999   TONSILLECTOMY     TRIGGER FINGER RELEASE  12/25/2010   TUBAL LIGATION  10/16/1985    SOCIAL HISTORY: Social History   Socioeconomic History   Marital status: Widowed    Spouse name: Not on file   Number of children: Not on file   Years of education:  Not on file   Highest education level: Not on file  Occupational History   Not on file  Tobacco Use   Smoking status: Never   Smokeless tobacco: Never  Vaping Use   Vaping Use: Never used  Substance and Sexual Activity   Alcohol use: No   Drug use: No   Sexual activity: Not Currently  Other Topics Concern   Not on file  Social History Narrative   Not on file   Social Determinants of Health   Financial Resource Strain: Not on file  Food  Insecurity: Not on file  Transportation Needs: Not on file  Physical Activity: Not on file  Stress: Not on file  Social Connections: Not on file  Intimate Partner Violence: Not on file    FAMILY HISTORY: Family History  Problem Relation Age of Onset   Diabetes Father    Alzheimer's disease Father    Basal cell carcinoma Mother        Metastatic    ALLERGIES:  is allergic to hydrocodone, vioxx [rofecoxib], codeine, doxycycline hyclate, and latex.  MEDICATIONS:  Current Outpatient Medications  Medication Sig Dispense Refill   acyclovir (ZOVIRAX) 400 MG tablet Take 1 tablet (400 mg total) by mouth 2 (two) times daily. 180 tablet 1   ALPRAZolam (XANAX) 0.25 MG tablet Take 0.25 mg by mouth 3 (three) times daily as needed.     amLODipine (NORVASC) 5 MG tablet Take 5 mg by mouth daily.     aspirin 81 MG tablet Take 81 mg by mouth daily.     cetirizine (ZYRTEC) 10 MG tablet Take 10 mg by mouth at bedtime.     Cholecalciferol (VITAMIN D3) 2000 UNITS capsule Take 2,000 Units by mouth daily.     dexamethasone (DECADRON) 4 MG tablet Take 40 mg (10 tablets) by mouth once a week. 40 tablet 1   dextromethorphan-guaiFENesin (TUSSIN DM) 10-100 MG/5ML liquid Take 10 mLs by mouth every 4 (four) hours as needed for cough.     fluticasone (FLONASE) 50 MCG/ACT nasal spray Place 2 sprays into the nose at bedtime.     glimepiride (AMARYL) 2 MG tablet Take 1-2 mg by mouth daily as needed (high blood sugar).     hydrOXYzine (ATARAX) 25 MG tablet Take 25 mg by mouth at bedtime.     metoCLOPramide (REGLAN) 10 MG tablet Take 1 tablet (10 mg total) by mouth every 6 (six) hours. 60 tablet 0   Probiotic Product (ALIGN PO) Take by mouth daily.     simvastatin (ZOCOR) 20 MG tablet Take 20 mg by mouth every evening.     traMADol (ULTRAM) 50 MG tablet TAKE 1 TO 2 TABLETS BY MOUTH EVERY 4 HOURS AS NEEDED (Patient taking differently: Take 50-100 mg by mouth every 4 (four) hours as needed. Only taking once a day) 90  tablet 0   acetaminophen (TYLENOL) 500 MG tablet Take 1,000 mg by mouth every 6 (six) hours as needed for moderate pain. (Patient not taking: Reported on 02/12/2023)     prochlorperazine (COMPAZINE) 10 MG tablet Take 1 tablet (10 mg total) by mouth every 6 (six) hours as needed for nausea or vomiting. (Patient not taking: Reported on 02/12/2023) 30 tablet 0   No current facility-administered medications for this visit.    REVIEW OF SYSTEMS:   Constitutional: ( - ) fevers, ( - )  chills , ( - ) night sweats Eyes: ( - ) blurriness of vision, ( - ) double vision, ( - ) watery  eyes Ears, nose, mouth, throat, and face: ( - ) mucositis, ( - ) sore throat Respiratory: ( - ) cough, ( - ) dyspnea, ( - ) wheezes Cardiovascular: ( - ) palpitation, ( - ) chest discomfort, ( - ) lower extremity swelling Gastrointestinal:  ( - ) nausea, ( - ) heartburn, ( - ) change in bowel habits Skin: ( - ) abnormal skin rashes Lymphatics: ( - ) new lymphadenopathy, ( - ) easy bruising Neurological: ( - ) numbness, ( - ) tingling, ( - ) new weaknesses Behavioral/Psych: ( - ) mood change, ( - ) new changes  All other systems were reviewed with the patient and are negative.  PHYSICAL EXAMINATION: ECOG PERFORMANCE STATUS: 1 - Symptomatic but completely ambulatory  Vitals:   02/12/23 1447  BP: (!) 144/76  Pulse: (!) 116  Resp: 20  Temp: (!) 97.5 F (36.4 C)  SpO2: 98%     Filed Weights   02/12/23 1447  Weight: 122 lb (55.3 kg)     GENERAL: Well-appearing elderly Caucasian female, alert, no distress and comfortable SKIN: skin color, texture, turgor are normal, no rashes or significant lesions EYES: conjunctiva are pink and non-injected, sclera clear LUNGS: clear to auscultation and percussion with normal breathing effort HEART: regular rate & rhythm and no murmurs and no lower extremity edema Musculoskeletal: no cyanosis of digits and no clubbing  PSYCH: alert & oriented x 3, fluent speech NEURO: no focal  motor/sensory deficits  LABORATORY DATA:  I have reviewed the data as listed    Latest Ref Rng & Units 02/12/2023    2:29 PM 02/04/2023    2:13 PM 01/28/2023   11:31 AM  CBC  WBC 4.0 - 10.5 K/uL 3.2  6.5  5.0   Hemoglobin 12.0 - 15.0 g/dL 16.1  09.6  04.5   Hematocrit 36.0 - 46.0 % 39.8  38.6  35.8   Platelets 150 - 400 K/uL 235  228  182        Latest Ref Rng & Units 02/12/2023    2:29 PM 02/04/2023    2:13 PM 01/28/2023   11:31 AM  CMP  Glucose 70 - 99 mg/dL 409  811  914   BUN 8 - 23 mg/dL 14  15  9    Creatinine 0.44 - 1.00 mg/dL 7.82  9.56  2.13   Sodium 135 - 145 mmol/L 139  139  139   Potassium 3.5 - 5.1 mmol/L 4.0  4.0  4.1   Chloride 98 - 111 mmol/L 106  105  105   CO2 22 - 32 mmol/L 23  25  26    Calcium 8.9 - 10.3 mg/dL 9.1  8.7  9.4   Total Protein 6.5 - 8.1 g/dL 6.6  6.5  5.9   Total Bilirubin 0.3 - 1.2 mg/dL 0.6  0.5  0.6   Alkaline Phos 38 - 126 U/L 170  149  141   AST 15 - 41 U/L 11  12  12    ALT 0 - 44 U/L 8  9  10      Lab Results  Component Value Date   MPROTEIN Not Observed 01/28/2023   MPROTEIN 0.1 (H) 11/14/2022   Lab Results  Component Value Date   KPAFRELGTCHN 714.1 (H) 01/28/2023   KPAFRELGTCHN 4,793.0 (H) 11/14/2022   LAMBDASER 2.9 (L) 01/28/2023   LAMBDASER 2.6 (L) 11/14/2022   KAPLAMBRATIO 246.24 (H) 01/28/2023   KAPLAMBRATIO 735.45 (H) 11/29/2022   KAPLAMBRATIO 0,865.78 (H) 11/14/2022  RADIOGRAPHIC STUDIES: No results found.  ASSESSMENT & PLAN Alice Anthony 82 y.o. female with medical history significant for IgG kappa multiple myeloma who presents for a follow up visit.   # IgG Kappa Multiple Myeloma # Plasmacytoma of Spine  -- Patient diagnosed with plasmacytoma of the spine, subsequent bone marrow biopsy confirmed an IgG kappa multiple myeloma involving the bone marrow --start of Velcade/Dex Cycle 1 Day 1 on 12/24/2022. Plan: -- Labs today show creatinine 0.79, white blood cell 3.2, hemoglobin 13.8, MCV 102.1, and platelets of  235 -- Okay to proceed with treatment.  Cycle 3 day 9 --Can consider the addition of Revlimid to the patient's treatment regimen at a later time. -- Return to clinic in 2 weeks with interval 1 week treatments.  #Supportive Care -- chemotherapy education complete -- port placement not required   -- zofran 8mg  q8H PRN and compazine 10mg  PO q6H for nausea -- acyclovir 400mg  PO BID for VCZ prophylaxis -- Recieved dental clearance prior to the start of Xgeva/Zometa. First dose on 01/07/2023.  -- Tramadol 50 mg every 6 hours as needed for pain control   No orders of the defined types were placed in this encounter.   All questions were answered. The patient knows to call the clinic with any problems, questions or concerns.  I have spent a total of 30 minutes minutes of face-to-face and non-face-to-face time, preparing to see the patient, performing a medically appropriate examination, counseling and educating the patient,  documenting clinical information in the electronic health record,and care coordination.   Georga Kaufmann PA-C Dept of Hematology and Oncology [facility] Phone: [phone]   02/12/2023 4:02 PM

## 2023-02-12 NOTE — Patient Instructions (Signed)
[facility]  Discharge Instructions: Thank you for choosing [facility] to provide your oncology and hematology care.   If you have a lab appointment with the Cancer Center, please go directly to the Cancer Center and check in at the registration area.   Wear comfortable clothing and clothing appropriate for easy access to any Portacath or PICC line.   We strive to give you quality time with your provider. You may need to reschedule your appointment if you arrive late (15 or more minutes).  Arriving late affects you and other patients whose appointments are after yours.  Also, if you miss three or more appointments without notifying the office, you may be dismissed from the clinic at the provider's discretion.      For prescription refill requests, have your pharmacy contact our office and allow 72 hours for refills to be completed.    Today you received the following chemotherapy and/or immunotherapy agents: Velcade     To help prevent nausea and vomiting after your treatment, we encourage you to take your nausea medication as directed.  BELOW ARE SYMPTOMS THAT SHOULD BE REPORTED IMMEDIATELY: *FEVER GREATER THAN 100.4 F (38 C) OR HIGHER *CHILLS OR SWEATING *NAUSEA AND VOMITING THAT IS NOT CONTROLLED WITH YOUR NAUSEA MEDICATION *UNUSUAL SHORTNESS OF BREATH *UNUSUAL BRUISING OR BLEEDING *URINARY PROBLEMS (pain or burning when urinating, or frequent urination) *BOWEL PROBLEMS (unusual diarrhea, constipation, pain near the anus) TENDERNESS IN MOUTH AND THROAT WITH OR WITHOUT PRESENCE OF ULCERS (sore throat, sores in mouth, or a toothache) UNUSUAL RASH, SWELLING OR PAIN  UNUSUAL VAGINAL DISCHARGE OR ITCHING   Items with * indicate a potential emergency and should be followed up as soon as possible or go to the Emergency Department if any problems should occur.  Please show the CHEMOTHERAPY ALERT CARD or IMMUNOTHERAPY ALERT CARD at check-in  to the Emergency Department and triage nurse.  Should you have questions after your visit or need to cancel or reschedule your appointment, please contact [facility]  Dept: [phone]  and follow the prompts.  Office hours are 8:00 a.m. to 4:30 p.m. Monday - Friday. Please note that voicemails left after 4:00 p.m. may not be returned until the following business day.  We are closed weekends and major holidays. You have access to a nurse at all times for urgent questions. Please call the main number to the clinic Dept: [phone] and follow the prompts.   For any non-urgent questions, you may also contact your provider using MyChart. We now offer e-Visits for anyone 18 and older to request care online for non-urgent symptoms. For details visit [email or web address].   Also download the MyChart app! Go to the app store, search "MyChart", open the app, select [place], and log in with your MyChart username and password.   

## 2023-02-12 NOTE — Progress Notes (Signed)
Patient states she took her dexamethasone at 0800 today.  Per Georga Kaufmann, PA-C, OK to treat with pulse 106.

## 2023-02-13 DIAGNOSIS — F419 Anxiety disorder, unspecified: Secondary | ICD-10-CM | POA: Diagnosis not present

## 2023-02-13 DIAGNOSIS — M8458XD Pathological fracture in neoplastic disease, other specified site, subsequent encounter for fracture with routine healing: Secondary | ICD-10-CM | POA: Diagnosis not present

## 2023-02-13 DIAGNOSIS — K5641 Fecal impaction: Secondary | ICD-10-CM | POA: Diagnosis not present

## 2023-02-13 DIAGNOSIS — M1990 Unspecified osteoarthritis, unspecified site: Secondary | ICD-10-CM | POA: Diagnosis not present

## 2023-02-13 DIAGNOSIS — C9030 Solitary plasmacytoma not having achieved remission: Secondary | ICD-10-CM | POA: Diagnosis not present

## 2023-02-13 DIAGNOSIS — Z79899 Other long term (current) drug therapy: Secondary | ICD-10-CM | POA: Diagnosis not present

## 2023-02-15 DIAGNOSIS — K5641 Fecal impaction: Secondary | ICD-10-CM | POA: Diagnosis not present

## 2023-02-15 DIAGNOSIS — Z79899 Other long term (current) drug therapy: Secondary | ICD-10-CM | POA: Diagnosis not present

## 2023-02-15 DIAGNOSIS — M8458XD Pathological fracture in neoplastic disease, other specified site, subsequent encounter for fracture with routine healing: Secondary | ICD-10-CM | POA: Diagnosis not present

## 2023-02-15 DIAGNOSIS — F419 Anxiety disorder, unspecified: Secondary | ICD-10-CM | POA: Diagnosis not present

## 2023-02-15 DIAGNOSIS — C9030 Solitary plasmacytoma not having achieved remission: Secondary | ICD-10-CM | POA: Diagnosis not present

## 2023-02-15 DIAGNOSIS — M1990 Unspecified osteoarthritis, unspecified site: Secondary | ICD-10-CM | POA: Diagnosis not present

## 2023-02-18 ENCOUNTER — Encounter: Payer: Self-pay | Admitting: Hematology and Oncology

## 2023-02-18 ENCOUNTER — Inpatient Hospital Stay: Payer: Medicare Other

## 2023-02-18 ENCOUNTER — Other Ambulatory Visit: Payer: Self-pay

## 2023-02-18 VITALS — BP 117/62 | HR 98 | Temp 98.9°F | Resp 16

## 2023-02-18 DIAGNOSIS — C9000 Multiple myeloma not having achieved remission: Secondary | ICD-10-CM | POA: Diagnosis not present

## 2023-02-18 DIAGNOSIS — Z5112 Encounter for antineoplastic immunotherapy: Secondary | ICD-10-CM | POA: Diagnosis not present

## 2023-02-18 DIAGNOSIS — Z79899 Other long term (current) drug therapy: Secondary | ICD-10-CM | POA: Diagnosis not present

## 2023-02-18 LAB — CBC WITH DIFFERENTIAL (CANCER CENTER ONLY)
Abs Immature Granulocytes: 0 10*3/uL (ref 0.00–0.07)
Basophils Absolute: 0 10*3/uL (ref 0.0–0.1)
Basophils Relative: 0 %
Eosinophils Absolute: 0 10*3/uL (ref 0.0–0.5)
Eosinophils Relative: 0 %
HCT: 38.4 % (ref 36.0–46.0)
Hemoglobin: 13.6 g/dL (ref 12.0–15.0)
Immature Granulocytes: 0 %
Lymphocytes Relative: 12 %
Lymphs Abs: 0.3 10*3/uL — ABNORMAL LOW (ref 0.7–4.0)
MCH: 35.8 pg — ABNORMAL HIGH (ref 26.0–34.0)
MCHC: 35.4 g/dL (ref 30.0–36.0)
MCV: 101.1 fL — ABNORMAL HIGH (ref 80.0–100.0)
Monocytes Absolute: 0 10*3/uL — ABNORMAL LOW (ref 0.1–1.0)
Monocytes Relative: 1 %
Neutro Abs: 2.3 10*3/uL (ref 1.7–7.7)
Neutrophils Relative %: 87 %
Platelet Count: 214 10*3/uL (ref 150–400)
RBC: 3.8 MIL/uL — ABNORMAL LOW (ref 3.87–5.11)
RDW: 12.2 % (ref 11.5–15.5)
WBC Count: 2.7 10*3/uL — ABNORMAL LOW (ref 4.0–10.5)
nRBC: 0 % (ref 0.0–0.2)

## 2023-02-18 LAB — CMP (CANCER CENTER ONLY)
ALT: 9 U/L (ref 0–44)
AST: 12 U/L — ABNORMAL LOW (ref 15–41)
Albumin: 4.3 g/dL (ref 3.5–5.0)
Alkaline Phosphatase: 166 U/L — ABNORMAL HIGH (ref 38–126)
Anion gap: 9 (ref 5–15)
BUN: 14 mg/dL (ref 8–23)
CO2: 25 mmol/L (ref 22–32)
Calcium: 9.1 mg/dL (ref 8.9–10.3)
Chloride: 106 mmol/L (ref 98–111)
Creatinine: 0.85 mg/dL (ref 0.44–1.00)
GFR, Estimated: >60 mL/min (ref >60)
Glucose, Bld: 248 mg/dL — ABNORMAL HIGH (ref 70–99)
Potassium: 4 mmol/L (ref 3.5–5.1)
Sodium: 140 mmol/L (ref 135–145)
Total Bilirubin: 0.6 mg/dL (ref 0.3–1.2)
Total Protein: 6.3 g/dL — ABNORMAL LOW (ref 6.5–8.1)

## 2023-02-18 MED ORDER — BORTEZOMIB CHEMO SQ INJECTION 3.5 MG (2.5MG/ML)
1.3000 mg/m2 | Freq: Once | INTRAMUSCULAR | Status: AC
  Administered 2023-02-18: 2 mg via SUBCUTANEOUS
  Filled 2023-02-18: qty 0.8

## 2023-02-18 NOTE — Patient Instructions (Signed)
 [facility]  Discharge Instructions: Thank you for choosing [facility] to provide your oncology and hematology care.   If you have a lab appointment with the Cancer Center, please go directly to the Cancer Center and check in at the registration area.   Wear comfortable clothing and clothing appropriate for easy access to any Portacath or PICC line.   We strive to give you quality time with your provider. You may need to reschedule your appointment if you arrive late (15 or more minutes).  Arriving late affects you and other patients whose appointments are after yours.  Also, if you miss three or more appointments without notifying the office, you may be dismissed from the clinic at the provider's discretion.      For prescription refill requests, have your pharmacy contact our office and allow 72 hours for refills to be completed.    Today you received the following chemotherapy and/or immunotherapy agents velcade      To help prevent nausea and vomiting after your treatment, we encourage you to take your nausea medication as directed.  BELOW ARE SYMPTOMS THAT SHOULD BE REPORTED IMMEDIATELY: *FEVER GREATER THAN 100.4 F (38 C) OR HIGHER *CHILLS OR SWEATING *NAUSEA AND VOMITING THAT IS NOT CONTROLLED WITH YOUR NAUSEA MEDICATION *UNUSUAL SHORTNESS OF BREATH *UNUSUAL BRUISING OR BLEEDING *URINARY PROBLEMS (pain or burning when urinating, or frequent urination) *BOWEL PROBLEMS (unusual diarrhea, constipation, pain near the anus) TENDERNESS IN MOUTH AND THROAT WITH OR WITHOUT PRESENCE OF ULCERS (sore throat, sores in mouth, or a toothache) UNUSUAL RASH, SWELLING OR PAIN  UNUSUAL VAGINAL DISCHARGE OR ITCHING   Items with * indicate a potential emergency and should be followed up as soon as possible or go to the Emergency Department if any problems should occur.  Please show the CHEMOTHERAPY ALERT CARD or IMMUNOTHERAPY ALERT CARD at check-in  to the Emergency Department and triage nurse.  Should you have questions after your visit or need to cancel or reschedule your appointment, please contact [facility] [facility]  Dept: [phone]  and follow the prompts.  Office hours are 8:00 a.m. to 4:30 p.m. Monday - Friday. Please note that voicemails left after 4:00 p.m. may not be returned until the following business day.  We are closed weekends and major holidays. You have access to a nurse at all times for urgent questions. Please call the main number to the clinic Dept: [phone] and follow the prompts.   For any non-urgent questions, you may also contact your provider using MyChart. We now offer e-Visits for anyone 18 and older to request care online for non-urgent symptoms. For details visit [email or web address].   Also download the MyChart app! Go to the app store, search "MyChart", open the app, select [facility], and log in with your MyChart username and password.   

## 2023-02-18 NOTE — Progress Notes (Signed)
Pt states she took steroids at 0800 today, 02/18/23.

## 2023-02-20 DIAGNOSIS — K5790 Diverticulosis of intestine, part unspecified, without perforation or abscess without bleeding: Secondary | ICD-10-CM | POA: Diagnosis not present

## 2023-02-20 DIAGNOSIS — Z87442 Personal history of urinary calculi: Secondary | ICD-10-CM | POA: Diagnosis not present

## 2023-02-20 DIAGNOSIS — J45909 Unspecified asthma, uncomplicated: Secondary | ICD-10-CM | POA: Diagnosis not present

## 2023-02-20 DIAGNOSIS — I10 Essential (primary) hypertension: Secondary | ICD-10-CM | POA: Diagnosis not present

## 2023-02-20 DIAGNOSIS — F419 Anxiety disorder, unspecified: Secondary | ICD-10-CM | POA: Diagnosis not present

## 2023-02-20 DIAGNOSIS — M1990 Unspecified osteoarthritis, unspecified site: Secondary | ICD-10-CM | POA: Diagnosis not present

## 2023-02-20 DIAGNOSIS — Z7982 Long term (current) use of aspirin: Secondary | ICD-10-CM | POA: Diagnosis not present

## 2023-02-20 DIAGNOSIS — K219 Gastro-esophageal reflux disease without esophagitis: Secondary | ICD-10-CM | POA: Diagnosis not present

## 2023-02-20 DIAGNOSIS — Z7984 Long term (current) use of oral hypoglycemic drugs: Secondary | ICD-10-CM | POA: Diagnosis not present

## 2023-02-20 DIAGNOSIS — E785 Hyperlipidemia, unspecified: Secondary | ICD-10-CM | POA: Diagnosis not present

## 2023-02-20 DIAGNOSIS — Z853 Personal history of malignant neoplasm of breast: Secondary | ICD-10-CM | POA: Diagnosis not present

## 2023-02-20 DIAGNOSIS — K5903 Drug induced constipation: Secondary | ICD-10-CM | POA: Diagnosis not present

## 2023-02-20 DIAGNOSIS — Z79891 Long term (current) use of opiate analgesic: Secondary | ICD-10-CM | POA: Diagnosis not present

## 2023-02-20 DIAGNOSIS — Z79899 Other long term (current) drug therapy: Secondary | ICD-10-CM | POA: Diagnosis not present

## 2023-02-20 DIAGNOSIS — C9030 Solitary plasmacytoma not having achieved remission: Secondary | ICD-10-CM | POA: Diagnosis not present

## 2023-02-20 DIAGNOSIS — Z85828 Personal history of other malignant neoplasm of skin: Secondary | ICD-10-CM | POA: Diagnosis not present

## 2023-02-20 DIAGNOSIS — M8458XD Pathological fracture in neoplastic disease, other specified site, subsequent encounter for fracture with routine healing: Secondary | ICD-10-CM | POA: Diagnosis not present

## 2023-02-20 DIAGNOSIS — R7303 Prediabetes: Secondary | ICD-10-CM | POA: Diagnosis not present

## 2023-02-23 ENCOUNTER — Telehealth: Payer: Medicare Other | Admitting: Nurse Practitioner

## 2023-02-23 ENCOUNTER — Telehealth: Payer: Medicare Other | Admitting: Family Medicine

## 2023-02-23 DIAGNOSIS — H00012 Hordeolum externum right lower eyelid: Secondary | ICD-10-CM

## 2023-02-23 MED ORDER — MOXIFLOXACIN HCL 0.5 % OP SOLN: 1.0000 [drp] | Freq: Three times a day (TID) | OPHTHALMIC | 0 refills | Status: AC

## 2023-02-23 MED ORDER — BACITRA-NEOMYCIN-POLYMYXIN-HC 1 % OP OINT: 1.0000 | TOPICAL_OINTMENT | Freq: Two times a day (BID) | OPHTHALMIC | 0 refills | Status: DC

## 2023-02-23 NOTE — Progress Notes (Signed)
  E-Visit for Stye   We are sorry that you are not feeling well. Here is how we plan to help!  Based on what you have shared with me it looks like you have a stye.  A stye is an inflammation of the eyelid.  It is often a red, painful lump near the edge of the eyelid that may look like a boil or a pimple.  A stye develops when an infection occurs at the base of an eyelash.   We have made appropriate suggestions for you based upon your presentation: Your symptoms may indicate an infection of the sclera.  The use of anti-inflammatory and antibiotic eye drops for a week will help resolve this condition.  I have sent in neomycin-polymyxin HC opthalmic suspension, two to three drops in the affected eye every 4 hours.  If your symptoms do not improve over the next two to three days you should be seen in your doctor's office.  HOME CARE:  Wash your hands often! Let the stye open on its own. Don't squeeze or open it. Don't rub your eyes. This can irritate your eyes and let in bacteria.  If you need to touch your eyes, wash your hands first. Don't wear eye makeup or contact lenses until the area has healed.  GET HELP RIGHT AWAY IF:  Your symptoms do not improve. You develop blurred or loss of vision. Your symptoms worsen (increased discharge, pain or redness).   Thank you for choosing an e-visit.  Your e-visit answers were reviewed by a board certified advanced clinical practitioner to complete your personal care plan. Depending upon the condition, your plan could have included both over the counter or prescription medications.  Please review your pharmacy choice. Make sure the pharmacy is open so you can pick up prescription now. If there is a problem, you may contact your provider through [employer] and have the prescription routed to another pharmacy.  Your safety is important to [place]. If you have drug allergies check your prescription carefully.   For the next 24 hours you can use  MyChart to ask questions about today's visit, request a non-urgent call back, or ask for a work or school excuse. You will get an email in the next two days asking about your experience. I hope that your e-visit has been valuable and will speed your recovery.  Mary-Margaret Hassell Done, FNP   5-10 minutes spent reviewing and documenting in chart.

## 2023-02-23 NOTE — Patient Instructions (Signed)
Chalazion  A chalazion is a swelling or lump on the eyelid. It can affect the upper eyelid or the lower eyelid. What are the causes? This condition may be caused by: Long-lasting (chronic) inflammation of the eyelid glands. A blocked oil gland in the eyelid. What are the signs or symptoms? Symptoms of this condition include: Swelling of the eyelid that: May spread to areas around the eye. May be painful. A hard lump on the eyelid. Blurry vision. The lump may make it hard to see out of the eye. How is this diagnosed? This condition is diagnosed with an examination of the eye. How is this treated? This condition is treated by applying a warm, moist cloth (warm compress) to the eyelid. If the condition does not improve, it may be treated with: Medicine that is applied to the eye. Oral medicines. Medicine that is injected into the chalazion. Surgery. Follow these instructions at home: Managing pain and swelling Apply a warm compress to the eyelid for 10-15 minutes, 4 to 6 times a day. This will help to open any blocked glands and to reduce redness and swelling. Take and apply over-the-counter and prescription medicines only as told by your health care provider. General instructions Do not touch the chalazion. Do not try to remove the pus. Do not squeeze the chalazion or stick it with a pin or needle. Do not rub your eyes. Wash your hands often with soap and water for at least 20 seconds. Dry your hands with a clean towel. Keep your face, scalp, and eyebrows clean. Avoid wearing eye makeup. Keep all follow-up visits. This is important. Contact a health care provider if: Your eyelid is getting worse. You have a fever. The chalazion does not break open (rupture) or go away on its own and your eyelid has not improved for 4 weeks. Get help right away if: You have pain in your eye. Your vision worsens. The chalazion becomes painful or red. The chalazion gets bigger. Summary A  chalazion is a swelling or lump on the upper or lower eyelid. It may be caused by chronic inflammation or a blocked oil gland. Apply a warm compress to the eyelid for 10-15 minutes, 4 to 6 times a day. Keep your face, scalp, and eyebrows clean. This information is not intended to replace advice given to you by your health care provider. Make sure you discuss any questions you have with your health care provider. Document Revised: 09/28/2020 Document Reviewed: 09/28/2020 Elsevier Patient Education  2024 [employer].

## 2023-02-23 NOTE — Progress Notes (Signed)
Virtual Visit Consent   Alice Anthony, you are scheduled for a virtual visit with a [facility] provider today. Just as with appointments in the office, your consent must be obtained to participate. Your consent will be active for this visit and any virtual visit you may have with one of our providers in the next 365 days. If you have a MyChart account, a copy of this consent can be sent to you electronically.  As this is a virtual visit, video technology does not allow for your provider to perform a traditional examination. This may limit your provider's ability to fully assess your condition. If your provider identifies any concerns that need to be evaluated in person or the need to arrange testing (such as labs, EKG, etc.), we will make arrangements to do so. Although advances in technology are sophisticated, we cannot ensure that it will always work on either your end or our end. If the connection with a video visit is poor, the visit may have to be switched to a telephone visit. With either a video or telephone visit, we are not always able to ensure that we have a secure connection.  By engaging in this virtual visit, you consent to the provision of healthcare and authorize for your insurance to be billed (if applicable) for the services provided during this visit. Depending on your insurance coverage, you may receive a charge related to this service.  I need to obtain your verbal consent now. Are you willing to proceed with your visit today? Alice Anthony has provided verbal consent on 02/23/2023 for a virtual visit (video or telephone). Georgana Curio, FNP  Date: 02/23/2023 12:18 PM  Virtual Visit via Video Note   I, Georgana Curio, connected with  Alice Anthony  ([id number], [birth year]) on 02/23/23 at 12:15 PM EDT by a video-enabled telemedicine application and verified that I am speaking with the correct person using two identifiers.  Location: Patient: Virtual Visit Location Patient:  Home Provider: Virtual Visit Location Provider: Home Office   I discussed the limitations of evaluation and management by telemedicine and the availability of in person appointments. The patient expressed understanding and agreed to proceed.    History of Present Illness: Alice Anthony is a 82 y.o. who identifies as a female who was assigned female at birth, and is being seen today for stye rt lower eye lid with irritation. She does not wear makeup. Marland Kitchen  HPI: HPI  Problems:  Patient Active Problem List   Diagnosis Date Noted   Multiple myeloma (HCC) 12/19/2022   Diabetes mellitus without complication (HCC) 05/25/2020   Posterior vitreous detachment of right eye 05/18/2020   Posterior vitreous detachment of left eye 05/18/2020   Pseudophakia, both eyes 05/18/2020   Postoperative wound infection 01/05/2013   Hyperkalemia 09/21/2012   Acute diverticulitis 09/20/2012   Perforated bowel (HCC) 09/20/2012   Intra-abdominal abscess (HCC) 09/20/2012   Nausea and vomiting 09/14/2012   Abdominal pain 09/14/2012   Colon wall thickening 09/14/2012   Abnormal CT scan, pelvis 09/14/2012   Hypertension     Allergies:  Allergies  Allergen Reactions   Hydrocodone Nausea And Vomiting    Severe vomiting   Vioxx [Rofecoxib] Shortness Of Breath   Codeine Nausea And Vomiting    NOTHING WITH CODEINE   Doxycycline Hyclate     kidney stone   Latex Rash   Medications:  Current Outpatient Medications:    moxifloxacin (VIGAMOX) 0.5 % ophthalmic solution, Place 1 drop into the  right eye 3 (three) times daily for 7 days., Disp: 3 mL, Rfl: 0   acetaminophen (TYLENOL) 500 MG tablet, Take 1,000 mg by mouth every 6 (six) hours as needed for moderate pain. (Patient not taking: Reported on 02/12/2023), Disp: , Rfl:    acyclovir (ZOVIRAX) 400 MG tablet, Take 1 tablet (400 mg total) by mouth 2 (two) times daily., Disp: 180 tablet, Rfl: 1   ALPRAZolam (XANAX) 0.25 MG tablet, Take 0.25 mg by mouth 3 (three) times  daily as needed., Disp: , Rfl:    amLODipine (NORVASC) 5 MG tablet, Take 5 mg by mouth daily., Disp: , Rfl:    aspirin 81 MG tablet, Take 81 mg by mouth daily., Disp: , Rfl:    bacitracin-neomycin-polymyxin-hydrocortisone (CORTISPORIN) 1 % ophthalmic ointment, Place 1 Application into the right eye 2 (two) times daily., Disp: 3.5 g, Rfl: 0   cetirizine (ZYRTEC) 10 MG tablet, Take 10 mg by mouth at bedtime., Disp: , Rfl:    Cholecalciferol (VITAMIN D3) 2000 UNITS capsule, Take 2,000 Units by mouth daily., Disp: , Rfl:    dexamethasone (DECADRON) 4 MG tablet, Take 40 mg (10 tablets) by mouth once a week., Disp: 40 tablet, Rfl: 1   dextromethorphan-guaiFENesin (TUSSIN DM) 10-100 MG/5ML liquid, Take 10 mLs by mouth every 4 (four) hours as needed for cough., Disp: , Rfl:    fluticasone (FLONASE) 50 MCG/ACT nasal spray, Place 2 sprays into the nose at bedtime., Disp: , Rfl:    glimepiride (AMARYL) 2 MG tablet, Take 1-2 mg by mouth daily as needed (high blood sugar)., Disp: , Rfl:    hydrOXYzine (ATARAX) 25 MG tablet, Take 25 mg by mouth at bedtime., Disp: , Rfl:    metoCLOPramide (REGLAN) 10 MG tablet, Take 1 tablet (10 mg total) by mouth every 6 (six) hours., Disp: 60 tablet, Rfl: 0   Probiotic Product (ALIGN PO), Take by mouth daily., Disp: , Rfl:    prochlorperazine (COMPAZINE) 10 MG tablet, Take 1 tablet (10 mg total) by mouth every 6 (six) hours as needed for nausea or vomiting. (Patient not taking: Reported on 02/12/2023), Disp: 30 tablet, Rfl: 0   simvastatin (ZOCOR) 20 MG tablet, Take 20 mg by mouth every evening., Disp: , Rfl:    traMADol (ULTRAM) 50 MG tablet, TAKE 1 TO 2 TABLETS BY MOUTH EVERY 4 HOURS AS NEEDED, Disp: 90 tablet, Rfl: 0  Observations/Objective: Patient is well-developed, well-nourished in no acute distress.  Resting comfortably  at home.  Head is normocephalic, atraumatic.  No labored breathing.  Speech is clear and coherent with logical content.  Patient is alert and  oriented at baseline.    Assessment and Plan: 1. Hordeolum externum of right lower eyelid  Warm compresses, med use and side effects discussed, UC if sx worsen.   Follow Up Instructions: I discussed the assessment and treatment plan with the patient. The patient was provided an opportunity to ask questions and all were answered. The patient agreed with the plan and demonstrated an understanding of the instructions.  A copy of instructions were sent to the patient via MyChart unless otherwise noted below.     The patient was advised to call back or seek an in-person evaluation if the symptoms worsen or if the condition fails to improve as anticipated.  Time:  I spent 10 minutes with the patient via telehealth technology discussing the above problems/concerns.    Georgana Curio, FNP

## 2023-02-25 ENCOUNTER — Telehealth: Payer: Self-pay | Admitting: Nurse Practitioner

## 2023-02-25 NOTE — Telephone Encounter (Signed)
[facility] pharmacy called to let MMM know that the bacitracin-neomycin-polymyxin-hydrocortisone (CORTISPORIN) 1 % ophthalmic ointment is on back order and wont be in stock until end of August but says they have the ointment in stock with everything except the hydrocortisone if MMM is ok with them filling that for pt.  Confirmed with MMM nurse that it is ok to fill that Rx for pt.

## 2023-02-26 ENCOUNTER — Other Ambulatory Visit: Payer: Self-pay

## 2023-02-26 ENCOUNTER — Encounter: Payer: Self-pay | Admitting: Hematology and Oncology

## 2023-02-26 ENCOUNTER — Other Ambulatory Visit: Payer: Self-pay | Admitting: Hematology and Oncology

## 2023-02-26 ENCOUNTER — Inpatient Hospital Stay: Payer: Medicare Other

## 2023-02-26 ENCOUNTER — Inpatient Hospital Stay: Payer: Medicare Other | Admitting: Physician Assistant

## 2023-02-26 VITALS — BP 141/69 | HR 86 | Temp 98.1°F | Resp 16 | Ht 61.0 in | Wt 123.5 lb

## 2023-02-26 DIAGNOSIS — C9000 Multiple myeloma not having achieved remission: Secondary | ICD-10-CM

## 2023-02-26 DIAGNOSIS — H00012 Hordeolum externum right lower eyelid: Secondary | ICD-10-CM | POA: Diagnosis not present

## 2023-02-26 DIAGNOSIS — Z5112 Encounter for antineoplastic immunotherapy: Secondary | ICD-10-CM | POA: Diagnosis not present

## 2023-02-26 DIAGNOSIS — Z79899 Other long term (current) drug therapy: Secondary | ICD-10-CM | POA: Diagnosis not present

## 2023-02-26 LAB — CBC WITH DIFFERENTIAL (CANCER CENTER ONLY)
Abs Immature Granulocytes: 0.01 10*3/uL (ref 0.00–0.07)
Basophils Absolute: 0 10*3/uL (ref 0.0–0.1)
Basophils Relative: 1 %
Eosinophils Absolute: 0.1 10*3/uL (ref 0.0–0.5)
Eosinophils Relative: 2 %
HCT: 36.9 % (ref 36.0–46.0)
Hemoglobin: 13.1 g/dL (ref 12.0–15.0)
Immature Granulocytes: 0 %
Lymphocytes Relative: 10 %
Lymphs Abs: 0.4 10*3/uL — ABNORMAL LOW (ref 0.7–4.0)
MCH: 35.9 pg — ABNORMAL HIGH (ref 26.0–34.0)
MCHC: 35.5 g/dL (ref 30.0–36.0)
MCV: 101.1 fL — ABNORMAL HIGH (ref 80.0–100.0)
Monocytes Absolute: 0.1 10*3/uL (ref 0.1–1.0)
Monocytes Relative: 3 %
Neutro Abs: 3.1 10*3/uL (ref 1.7–7.7)
Neutrophils Relative %: 84 %
Platelet Count: 211 10*3/uL (ref 150–400)
RBC: 3.65 MIL/uL — ABNORMAL LOW (ref 3.87–5.11)
RDW: 12.2 % (ref 11.5–15.5)
WBC Count: 3.6 10*3/uL — ABNORMAL LOW (ref 4.0–10.5)
nRBC: 0 % (ref 0.0–0.2)

## 2023-02-26 LAB — CMP (CANCER CENTER ONLY)
ALT: 8 U/L (ref 0–44)
AST: 13 U/L — ABNORMAL LOW (ref 15–41)
Albumin: 4.2 g/dL (ref 3.5–5.0)
Alkaline Phosphatase: 116 U/L (ref 38–126)
Anion gap: 7 (ref 5–15)
BUN: 9 mg/dL (ref 8–23)
CO2: 26 mmol/L (ref 22–32)
Calcium: 9 mg/dL (ref 8.9–10.3)
Chloride: 108 mmol/L (ref 98–111)
Creatinine: 0.59 mg/dL (ref 0.44–1.00)
GFR, Estimated: >60 mL/min (ref >60)
Glucose, Bld: 125 mg/dL — ABNORMAL HIGH (ref 70–99)
Potassium: 4 mmol/L (ref 3.5–5.1)
Sodium: 141 mmol/L (ref 135–145)
Total Bilirubin: 0.6 mg/dL (ref 0.3–1.2)
Total Protein: 5.9 g/dL — ABNORMAL LOW (ref 6.5–8.1)

## 2023-02-26 MED ORDER — AZITHROMYCIN 250 MG PO TABS: ORAL_TABLET | ORAL | 0 refills | Status: DC

## 2023-02-26 NOTE — Progress Notes (Signed)
ON PATHWAY REGIMEN - Multiple Myeloma and Other Plasma Cell Dyscrasias  No Change  Continue With Treatment as Ordered.  Original Decision Date/Time: 12/19/2022 07:30     A cycle is every 21 days:     Bortezomib      Lenalidomide      Dexamethasone   **Always confirm dose/schedule in your pharmacy ordering system**  Patient Characteristics: Multiple Myeloma, Newly Diagnosed, Transplant Ineligible or Refused, Unknown or Awaiting Test Results Disease Classification: Multiple Myeloma Therapeutic Status: Newly Diagnosed R2-ISS Staging: II Is Patient Eligible for Transplant<= Transplant Ineligible or Refused Risk Status: Awaiting Test Results Intent of Therapy: Curative Intent, Discussed with Patient

## 2023-02-26 NOTE — Progress Notes (Signed)
[facility] [phone]   [phone]  PROGRESS NOTE  Patient Care Team: [name], MD as PCP - General (Family Medicine) Pickenpack-Cousar, [name], NP as Nurse Practitioner (Nurse Practitioner)  Hematological/Oncological History # Plasmacytoma of Thoracic Spine  # IgG Kappa Multiple myeloma 09/22/2022: Patient underwent MRI thoracic spine which showed acute or subacute T7, T10, and T11 compression fractures 10/10/2022: biopsy of compression fracture of T11 reveals a plasmacytoma 11/13/2022: establish care with Dr. Leonides Schanz  12/06/2022: bone marrow biopsy showed plasma cell myeloma involving 80% of the marrow.  12/24/2022: Cycle 1 Day 1 of Velcade/Dex 01/14/2023: Cycle 2 Day 1 of Velcade/Dex 02/04/2023: Cycle 3 Day 1 of Velcade/Dex 02/26/2023: Cycle 4 Day 1 of Velcade/Dex HELD due to ocular toxicity  Interval History:  Alice Anthony 82 y.o. female with medical history significant for newly diagnosed IgG kappa multiple myeloma who presents for a follow up visit. The patient's last visit was on 02/12/2023.  In the interim since the last visit she has continued on Velcade therapy.   On exam today Alice Anthony reports that she was evaluated by her PCP on 02/23/2023 after developing a stye of the right eyelid that became infected. She started antibiotic eyedrops which has improved her symptoms. She is otherwise feeling well and has no other side effects. Her appetite and energy levels are stable.She denies any nausea, vomiting or abdominal pain. Her bowel habits are unchanged and is able to control her chronic constipation. She denies any fevers, chills, sweats, shortness of breath, chest pain or cough.  A full 10 point ROS is otherwise negative.   MEDICAL HISTORY:  Past Medical History:  Diagnosis Date   Anxiety    Arthritis    Asthma    Hx: of   Cancer (HCC)    Hx: of skin cancer   Diverticulosis    GERD (gastroesophageal reflux disease)    Hx: of    History of kidney stones    Hyperlipemia    Hypertension    Pre-diabetes     SURGICAL HISTORY: Past Surgical History:  Procedure Laterality Date   ABDOMINAL SURGERY  09/19/2012   Ruptured Diverticuli, Ostomy Placed.   BREAST BIOPSY  06/06/2012   breast biopsy   CATARACT EXTRACTION W/ INTRAOCULAR LENS IMPLANT Bilateral    COLONOSCOPY  03/06/2012   COLOSTOMY CLOSURE  12/25/2012   Dr Luisa Hart   COLOSTOMY CLOSURE N/A 12/25/2012   Procedure: COLOSTOMY CLOSURE;  Surgeon: Clovis Pu. Cornett, MD;  Location: [facility] OR;  Service: General;  Laterality: N/A;   EXTRACORPOREAL SHOCK WAVE LITHOTRIPSY Left 11/17/2018   Procedure: EXTRACORPOREAL SHOCK WAVE LITHOTRIPSY (ESWL);  Surgeon: Malen Gauze, MD;  Location: [facility] OR[facility];  Service: Urology;  Laterality: Left;   EYE SURGERY  10/19/1998   Hx: of cyst removed from left eye   KYPHOPLASTY N/A 10/10/2022   Procedure: KYPHOPLASTY Thoracic seven , Thoracic ten,Thoracic eleven;  Surgeon: Tressie Stalker, MD;  Location: [facility] OR;  Service: Neurosurgery;  Laterality: N/A;   LAPAROTOMY N/A 09/19/2012   Procedure: EXPLORATORY LAPAROTOMY, sigmoid colectomy;  Surgeon: Clovis Pu. Cornett, MD;  Location: [facility];  Service: General;  Laterality: N/A;   SIGMOIDOSCOPY  01/11/1999   TONSILLECTOMY     TRIGGER FINGER RELEASE  12/25/2010   TUBAL LIGATION  10/16/1985    SOCIAL HISTORY: Social History   Socioeconomic History   Marital status: Widowed    Spouse name: Not on file   Number of children: Not on file   Years of education:  Not on file   Highest education level: Not on file  Occupational History   Not on file  Tobacco Use   Smoking status: Never   Smokeless tobacco: Never  Vaping Use   Vaping status: Never Used  Substance and Sexual Activity   Alcohol use: No   Drug use: No   Sexual activity: Not Currently  Other Topics Concern   Not on file  Social History Narrative   Not on file   Social Determinants of Health   Financial Resource Strain: Not  on file  Food Insecurity: Not on file  Transportation Needs: Not on file  Physical Activity: Not on file  Stress: Not on file  Social Connections: Not on file  Intimate Partner Violence: Not on file    FAMILY HISTORY: Family History  Problem Relation Age of Onset   Diabetes Father    Alzheimer's disease Father    Basal cell carcinoma Mother        Metastatic    ALLERGIES:  is allergic to hydrocodone, vioxx [rofecoxib], codeine, doxycycline hyclate, and latex.  MEDICATIONS:  Current Outpatient Medications  Medication Sig Dispense Refill   acyclovir (ZOVIRAX) 400 MG tablet Take 1 tablet (400 mg total) by mouth 2 (two) times daily. 180 tablet 1   ALPRAZolam (XANAX) 0.25 MG tablet Take 0.25 mg by mouth 3 (three) times daily as needed.     amLODipine (NORVASC) 5 MG tablet Take 5 mg by mouth daily.     aspirin 81 MG tablet Take 81 mg by mouth daily.     azithromycin (ZITHROMAX Z-PAK) 250 MG tablet Take 2 tablets (500 mg) by mouth on day 1, followed by 1 tablet (250 mg) by mouth for four more days. 6 each 0   bacitracin-neomycin-polymyxin-hydrocortisone (CORTISPORIN) 1 % ophthalmic ointment Place 1 Application into the right eye 2 (two) times daily. (Patient taking differently: Place 1 Application into the right eye 2 (two) times daily. Direction on bottle are three times a day for 7 days) 3.5 g 0   cetirizine (ZYRTEC) 10 MG tablet Take 10 mg by mouth at bedtime.     Cholecalciferol (VITAMIN D3) 2000 UNITS capsule Take 2,000 Units by mouth daily.     dexamethasone (DECADRON) 4 MG tablet Take 40 mg (10 tablets) by mouth once a week. 40 tablet 1   dextromethorphan-guaiFENesin (TUSSIN DM) 10-100 MG/5ML liquid Take 10 mLs by mouth every 4 (four) hours as needed for cough.     fluticasone (FLONASE) 50 MCG/ACT nasal spray Place 2 sprays into the nose at bedtime.     glimepiride (AMARYL) 2 MG tablet Take 1-2 mg by mouth daily as needed (high blood sugar).     hydrOXYzine (ATARAX) 25 MG tablet  Take 25 mg by mouth at bedtime.     moxifloxacin (VIGAMOX) 0.5 % ophthalmic solution Place 1 drop into the right eye 3 (three) times daily for 7 days. 3 mL 0   Probiotic Product (ALIGN PO) Take by mouth daily.     simvastatin (ZOCOR) 20 MG tablet Take 20 mg by mouth every evening.     traMADol (ULTRAM) 50 MG tablet TAKE 1 TO 2 TABLETS BY MOUTH EVERY 4 HOURS AS NEEDED (Patient taking differently: Take 50-100 mg by mouth every 4 (four) hours as needed. Currently taking 2 tablets PO daily) 90 tablet 0   acetaminophen (TYLENOL) 500 MG tablet Take 1,000 mg by mouth every 6 (six) hours as needed for moderate pain. (Patient not taking: Reported on 02/12/2023)  metoCLOPramide (REGLAN) 10 MG tablet Take 1 tablet (10 mg total) by mouth every 6 (six) hours. (Patient not taking: Reported on 02/26/2023) 60 tablet 0   prochlorperazine (COMPAZINE) 10 MG tablet Take 1 tablet (10 mg total) by mouth every 6 (six) hours as needed for nausea or vomiting. (Patient not taking: Reported on 02/12/2023) 30 tablet 0   No current facility-administered medications for this visit.    REVIEW OF SYSTEMS:   Constitutional: ( - ) fevers, ( - )  chills , ( - ) night sweats Eyes: ( - ) blurriness of vision, ( - ) double vision, ( - ) watery eyes Ears, nose, mouth, throat, and face: ( - ) mucositis, ( - ) sore throat Respiratory: ( - ) cough, ( - ) dyspnea, ( - ) wheezes Cardiovascular: ( - ) palpitation, ( - ) chest discomfort, ( - ) lower extremity swelling Gastrointestinal:  ( - ) nausea, ( - ) heartburn, ( - ) change in bowel habits Skin: ( - ) abnormal skin rashes Lymphatics: ( - ) new lymphadenopathy, ( - ) easy bruising Neurological: ( - ) numbness, ( - ) tingling, ( - ) new weaknesses Behavioral/Psych: ( - ) mood change, ( - ) new changes  All other systems were reviewed with the patient and are negative.  PHYSICAL EXAMINATION: ECOG PERFORMANCE STATUS: 1 - Symptomatic but completely ambulatory  Vitals:   02/26/23  1132  BP: (!) 141/69  Pulse: 86  Resp: 16  Temp: 98.1 F (36.7 C)  SpO2: 100%     Filed Weights   02/26/23 1132  Weight: 123 lb 8 oz (56 kg)     GENERAL: Well-appearing elderly Caucasian female, alert, no distress and comfortable SKIN: skin color, texture, turgor are normal, no rashes or significant lesions EYES: conjunctiva are pink and non-injected, sclera clear. Stye on lower eyelid of right eye that is erythematous. No discharge noted.  LUNGS: clear to auscultation and percussion with normal breathing effort HEART: regular rate & rhythm and no murmurs and no lower extremity edema Musculoskeletal: no cyanosis of digits and no clubbing  PSYCH: alert & oriented x 3, fluent speech NEURO: no focal motor/sensory deficits  LABORATORY DATA:  I have reviewed the data as listed    Latest Ref Rng & Units 02/26/2023   10:53 AM 02/18/2023    2:24 PM 02/12/2023    2:29 PM  CBC  WBC 4.0 - 10.5 K/uL 3.6  2.7  3.2   Hemoglobin 12.0 - 15.0 g/dL 52.8  41.3  24.4   Hematocrit 36.0 - 46.0 % 36.9  38.4  39.8   Platelets 150 - 400 K/uL 211  214  235        Latest Ref Rng & Units 02/26/2023   10:53 AM 02/18/2023    2:24 PM 02/12/2023    2:29 PM  CMP  Glucose 70 - 99 mg/dL 010  272  536   BUN 8 - 23 mg/dL 9  14  14    Creatinine 0.44 - 1.00 mg/dL 6.44  0.34  7.42   Sodium 135 - 145 mmol/L 141  140  139   Potassium 3.5 - 5.1 mmol/L 4.0  4.0  4.0   Chloride 98 - 111 mmol/L 108  106  106   CO2 22 - 32 mmol/L 26  25  23    Calcium 8.9 - 10.3 mg/dL 9.0  9.1  9.1   Total Protein 6.5 - 8.1 g/dL 5.9  6.3  6.6  Total Bilirubin 0.3 - 1.2 mg/dL 0.6  0.6  0.6   Alkaline Phos 38 - 126 U/L 116  166  170   AST 15 - 41 U/L 13  12  11    ALT 0 - 44 U/L 8  9  8      Lab Results  Component Value Date   MPROTEIN Not Observed 01/28/2023   MPROTEIN 0.1 (H) 11/14/2022   Lab Results  Component Value Date   KPAFRELGTCHN 714.1 (H) 01/28/2023   KPAFRELGTCHN 4,793.0 (H) 11/14/2022   LAMBDASER 2.9 (L)  01/28/2023   LAMBDASER 2.6 (L) 11/14/2022   KAPLAMBRATIO 246.24 (H) 01/28/2023   KAPLAMBRATIO 735.45 (H) 11/29/2022   KAPLAMBRATIO 8,295.62 (H) 11/14/2022    RADIOGRAPHIC STUDIES: No results found.  ASSESSMENT & PLAN Alice Anthony 82 y.o. female with medical history significant for IgG kappa multiple myeloma who presents for a follow up visit.   # IgG Kappa Multiple Myeloma # Plasmacytoma of Spine  -- Patient diagnosed with plasmacytoma of the spine, subsequent bone marrow biopsy confirmed an IgG kappa multiple myeloma involving the bone marrow --start of Velcade/Dex Cycle 1 Day 1 on 12/24/2022. Plan: -- Labs today show creatinine 0.59, white blood cell 3.6, hemoglobin 13.1, MCV 101.1, and platelets of 211 --Due to ocular toxicity, recommend to discontinue Velcade therapy and switch to Daratumumab.  --Can consider the addition of Revlimid to the patient's treatment regimen at a later time. -- Return to clinic in 1 week to start Dara/Dex.   #Ocular toxicity/Stye --Secondary to Velcade therapy --Currently on Moxifloxacin drops with improvement --Sent azithromycin x 5 days.   #Supportive Care -- chemotherapy education complete -- port placement not required   -- zofran 8mg  q8H PRN and compazine 10mg  PO q6H for nausea -- acyclovir 400mg  PO BID for VCZ prophylaxis -- Recieved dental clearance prior to the start of Xgeva/Zometa. First dose on 01/07/2023.  -- Tramadol 50 mg every 6 hours as needed for pain control   No orders of the defined types were placed in this encounter.   All questions were answered. The patient knows to call the clinic with any problems, questions or concerns.  I have spent a total of 30 minutes minutes of face-to-face and non-face-to-face time, preparing to see the patient, performing a medically appropriate examination, counseling and educating the patient,  documenting clinical information in the electronic health record,and care coordination.   Georga Kaufmann PA-C Dept of Hematology and Oncology [facility] Phone: [phone]   Patient was seen with Dr. Leonides Schanz.   I have read the above note and personally examined the patient. I agree with the assessment and plan as noted above.  Briefly Alice Anthony has developed styes of her right eye.  At this time I am concerned this is a side effect of her Velcade therapy.  As such I would recommend that we transition her from Velcade to Darzalex.  In the interim recommend hot compresses, continuing her antibiotic eyedrops, and a course of doxycycline.  Patient voiced understanding of our findings and the reasoning behind changing her therapy.   Ulysees Barns, MD Department of Hematology/Oncology [facility] Phone: [phone] Pager: [phone] Email: [email or web address]   02/26/2023 12:52 PM

## 2023-02-26 NOTE — Progress Notes (Signed)
DISCONTINUE ON PATHWAY REGIMEN - Multiple Myeloma and Other Plasma Cell Dyscrasias     A cycle is every 21 days:     Bortezomib      Lenalidomide      Dexamethasone   **Always confirm dose/schedule in your pharmacy ordering system**  REASON: Toxicities / Adverse Event PRIOR TREATMENT: [id number]: VRd (Bortezomib 1.3 mg/m2 SUBQ D1, 8, 15 + Lenalidomide 25 mg + Dexamethasone 40 mg) q21 Days x 8 Cycles TREATMENT RESPONSE: Partial Response (PR)  START ON PATHWAY REGIMEN - Multiple Myeloma and Other Plasma Cell Dyscrasias     Cycles 1 and 2: A cycle is every 28 days:     Lenalidomide      Dexamethasone      Daratumumab and hyaluronidase-fihj    Cycles 3 through 6: A cycle is every 28 days:     Lenalidomide      Dexamethasone      Daratumumab and hyaluronidase-fihj    Cycles 7 and beyond: A cycle is every 28 days:     Lenalidomide      Dexamethasone      Daratumumab and hyaluronidase-fihj   **Always confirm dose/schedule in your pharmacy ordering system**  Patient Characteristics: Multiple Myeloma, Newly Diagnosed, Transplant Ineligible or Refused, Unknown or Awaiting Test Results Disease Classification: Multiple Myeloma Therapeutic Status: Newly Diagnosed R2-ISS Staging: II Is Patient Eligible for Transplant<= Transplant Ineligible or Refused Risk Status: Awaiting Test Results Intent of Therapy: Curative Intent, Discussed with Patient

## 2023-02-27 DIAGNOSIS — J45909 Unspecified asthma, uncomplicated: Secondary | ICD-10-CM | POA: Diagnosis not present

## 2023-02-27 DIAGNOSIS — F419 Anxiety disorder, unspecified: Secondary | ICD-10-CM | POA: Diagnosis not present

## 2023-02-27 DIAGNOSIS — M8458XD Pathological fracture in neoplastic disease, other specified site, subsequent encounter for fracture with routine healing: Secondary | ICD-10-CM | POA: Diagnosis not present

## 2023-02-27 DIAGNOSIS — Z79899 Other long term (current) drug therapy: Secondary | ICD-10-CM | POA: Diagnosis not present

## 2023-02-27 DIAGNOSIS — C9030 Solitary plasmacytoma not having achieved remission: Secondary | ICD-10-CM | POA: Diagnosis not present

## 2023-02-27 DIAGNOSIS — M1990 Unspecified osteoarthritis, unspecified site: Secondary | ICD-10-CM | POA: Diagnosis not present

## 2023-02-27 LAB — KAPPA/LAMBDA LIGHT CHAINS
Kappa free light chain: 235.6 mg/L — ABNORMAL HIGH (ref 3.3–19.4)
Kappa, lambda light chain ratio: 71.39 — ABNORMAL HIGH (ref 0.26–1.65)
Lambda free light chains: 3.3 mg/L — ABNORMAL LOW (ref 5.7–26.3)

## 2023-02-28 LAB — MULTIPLE MYELOMA PANEL, SERUM
Albumin SerPl Elph-Mcnc: 3.8 g/dL (ref 2.9–4.4)
Albumin/Glob SerPl: 1.9 — ABNORMAL HIGH (ref 0.7–1.7)
Alpha 1: 0.3 g/dL (ref 0.0–0.4)
Alpha2 Glob SerPl Elph-Mcnc: 0.8 g/dL (ref 0.4–1.0)
B-Globulin SerPl Elph-Mcnc: 0.8 g/dL (ref 0.7–1.3)
Gamma Glob SerPl Elph-Mcnc: 0.1 g/dL — ABNORMAL LOW (ref 0.4–1.8)
Globulin, Total: 2.1 g/dL — ABNORMAL LOW (ref 2.2–3.9)
IgA: 7 mg/dL — ABNORMAL LOW (ref 64–422)
IgG (Immunoglobin G), Serum: 156 mg/dL — ABNORMAL LOW (ref 586–1602)
IgM (Immunoglobulin M), Srm: 12 mg/dL — ABNORMAL LOW (ref 26–217)
Total Protein ELP: 5.9 g/dL — ABNORMAL LOW (ref 6.0–8.5)

## 2023-03-03 ENCOUNTER — Encounter: Payer: Self-pay | Admitting: Hematology and Oncology

## 2023-03-04 ENCOUNTER — Inpatient Hospital Stay: Payer: Medicare Other

## 2023-03-04 ENCOUNTER — Other Ambulatory Visit: Payer: Self-pay | Admitting: *Deleted

## 2023-03-04 ENCOUNTER — Encounter: Payer: Self-pay | Admitting: General Practice

## 2023-03-04 ENCOUNTER — Other Ambulatory Visit: Payer: Self-pay

## 2023-03-04 ENCOUNTER — Other Ambulatory Visit: Payer: Self-pay | Admitting: Physician Assistant

## 2023-03-04 ENCOUNTER — Encounter: Payer: Self-pay | Admitting: Hematology and Oncology

## 2023-03-04 ENCOUNTER — Other Ambulatory Visit: Payer: Self-pay | Admitting: Medical Oncology

## 2023-03-04 VITALS — BP 115/64 | HR 99 | Temp 97.8°F | Resp 18

## 2023-03-04 DIAGNOSIS — Z79899 Other long term (current) drug therapy: Secondary | ICD-10-CM | POA: Diagnosis not present

## 2023-03-04 DIAGNOSIS — Z5112 Encounter for antineoplastic immunotherapy: Secondary | ICD-10-CM | POA: Diagnosis not present

## 2023-03-04 DIAGNOSIS — C9000 Multiple myeloma not having achieved remission: Secondary | ICD-10-CM

## 2023-03-04 LAB — TYPE AND SCREEN
ABO/RH(D): O POS
Antibody Screen: NEGATIVE

## 2023-03-04 LAB — CMP (CANCER CENTER ONLY)
ALT: 8 U/L (ref 0–44)
AST: 11 U/L — ABNORMAL LOW (ref 15–41)
Albumin: 4.1 g/dL (ref 3.5–5.0)
Alkaline Phosphatase: 109 U/L (ref 38–126)
Anion gap: 6 (ref 5–15)
BUN: 14 mg/dL (ref 8–23)
CO2: 27 mmol/L (ref 22–32)
Calcium: 9.2 mg/dL (ref 8.9–10.3)
Chloride: 107 mmol/L (ref 98–111)
Creatinine: 0.71 mg/dL (ref 0.44–1.00)
GFR, Estimated: >60 mL/min (ref >60)
Glucose, Bld: 133 mg/dL — ABNORMAL HIGH (ref 70–99)
Potassium: 4.2 mmol/L (ref 3.5–5.1)
Sodium: 140 mmol/L (ref 135–145)
Total Bilirubin: 0.6 mg/dL (ref 0.3–1.2)
Total Protein: 5.6 g/dL — ABNORMAL LOW (ref 6.5–8.1)

## 2023-03-04 LAB — PRETREATMENT RBC PHENOTYPE

## 2023-03-04 LAB — CBC WITH DIFFERENTIAL (CANCER CENTER ONLY)
Abs Immature Granulocytes: 0.01 10*3/uL (ref 0.00–0.07)
Basophils Absolute: 0.1 10*3/uL (ref 0.0–0.1)
Basophils Relative: 1 %
Eosinophils Absolute: 0.1 10*3/uL (ref 0.0–0.5)
Eosinophils Relative: 2 %
HCT: 36.9 % (ref 36.0–46.0)
Hemoglobin: 12.8 g/dL (ref 12.0–15.0)
Immature Granulocytes: 0 %
Lymphocytes Relative: 7 %
Lymphs Abs: 0.3 10*3/uL — ABNORMAL LOW (ref 0.7–4.0)
MCH: 35 pg — ABNORMAL HIGH (ref 26.0–34.0)
MCHC: 34.7 g/dL (ref 30.0–36.0)
MCV: 100.8 fL — ABNORMAL HIGH (ref 80.0–100.0)
Monocytes Absolute: 0.1 10*3/uL (ref 0.1–1.0)
Monocytes Relative: 3 %
Neutro Abs: 4.1 10*3/uL (ref 1.7–7.7)
Neutrophils Relative %: 87 %
Platelet Count: 302 10*3/uL (ref 150–400)
RBC: 3.66 MIL/uL — ABNORMAL LOW (ref 3.87–5.11)
RDW: 12.2 % (ref 11.5–15.5)
WBC Count: 4.7 10*3/uL (ref 4.0–10.5)
nRBC: 0 % (ref 0.0–0.2)

## 2023-03-04 LAB — GLUCOSE, CAPILLARY: Glucose-Capillary: 299 mg/dL — ABNORMAL HIGH (ref 70–99)

## 2023-03-04 MED ORDER — DARATUMUMAB-HYALURONIDASE-FIHJ 1800-30000 MG-UT/15ML ~~LOC~~ SOLN
1800.0000 mg | Freq: Once | SUBCUTANEOUS | Status: AC
  Administered 2023-03-04: 1800 mg via SUBCUTANEOUS
  Filled 2023-03-04: qty 15

## 2023-03-04 MED ORDER — MONTELUKAST SODIUM 10 MG PO TABS
10.0000 mg | ORAL_TABLET | Freq: Once | ORAL | Status: AC
  Administered 2023-03-04: 10 mg via ORAL
  Filled 2023-03-04: qty 1

## 2023-03-04 MED ORDER — DIPHENHYDRAMINE HCL 25 MG PO CAPS
50.0000 mg | ORAL_CAPSULE | Freq: Once | ORAL | Status: AC
  Administered 2023-03-04: 50 mg via ORAL
  Filled 2023-03-04: qty 2

## 2023-03-04 MED ORDER — ACETAMINOPHEN 325 MG PO TABS
650.0000 mg | ORAL_TABLET | Freq: Once | ORAL | Status: AC
  Administered 2023-03-04: 650 mg via ORAL
  Filled 2023-03-04: qty 2

## 2023-03-04 NOTE — Progress Notes (Signed)
Pt reported she took dexamethasone 40 mg this am.   1400-fingerstick glucose =299.  Administered 4 units of Novolog . Pt tolerated cycle 1 day 1 Daratumumab

## 2023-03-04 NOTE — Progress Notes (Signed)
[facility] Spiritual Care Note  Followed up with Ms Kirschbaum and her daughter [name] in infusion. She shared more sources of meaning in her life, processed family updates, reflected on the progress she is making in terms of physical movement/ADLs and pain management, and shared prayer requests.  Provided reflective listening and spiritual/emotional support. We plan to follow up at a future treatment.   [address] [place], [place], [facility] Pager [phone] Voicemail [phone]

## 2023-03-04 NOTE — Patient Instructions (Signed)
Daratumumab; Hyaluronidase Injection What is this medication? DARATUMUMAB; HYALURONIDASE (dar a toom ue mab; hye al ur ON i dase) treats multiple myeloma, a type of bone marrow cancer. Daratumumab works by blocking a protein that causes cancer cells to grow and multiply. This helps to slow or stop the spread of cancer cells. Hyaluronidase works by increasing the absorption of other medications in the body to help them work better. This medication may also be used treat amyloidosis, a condition that causes the buildup of a protein (amyloid) in your body. It works by reducing the buildup of this protein, which decreases symptoms. It is a combination medication that contains a monoclonal antibody. This medicine may be used for other purposes; ask your health care provider or pharmacist if you have questions. COMMON BRAND NAME(S): DARZALEX FASPRO What should I tell my care team before I take this medication? They need to know if you have any of these conditions: Heart disease Infection, such as chickenpox, cold sores, herpes, hepatitis B Lung or breathing disease An unusual or allergic reaction to daratumumab, hyaluronidase, other medications, foods, dyes, or preservatives Pregnant or trying to get pregnant Breast-feeding How should I use this medication? This medication is injected under the skin. It is given by your care team in a hospital or clinic setting. Talk to your care team about the use of this medication in children. Special care may be needed. Overdosage: If you think you have taken too much of this medicine contact a poison control center or emergency room at once. NOTE: This medicine is only for you. Do not share this medicine with others. What if I miss a dose? Keep appointments for follow-up doses. It is important not to miss your dose. Call your care team if you are unable to keep an appointment. What may interact with this medication? Interactions have not been studied. This list  may not describe all possible interactions. Give your health care provider a list of all the medicines, herbs, non-prescription drugs, or dietary supplements you use. Also tell them if you smoke, drink alcohol, or use illegal drugs. Some items may interact with your medicine. What should I watch for while using this medication? Your condition will be monitored carefully while you are receiving this medication. This medication can cause serious allergic reactions. To reduce your risk, your care team may give you other medication to take before receiving this one. Be sure to follow the directions from your care team. This medication can affect the results of blood tests to match your blood type. These changes can last for up to 6 months after the final dose. Your care team will do blood tests to match your blood type before you start treatment. Tell all of your care team that you are being treated with this medication before receiving a blood transfusion. This medication can affect the results of some tests used to determine treatment response; extra tests may be needed to evaluate response. Talk to your care team if you wish to become pregnant or think you are pregnant. This medication can cause serious birth defects if taken during pregnancy and for 3 months after the last dose. A reliable form of contraception is recommended while taking this medication and for 3 months after the last dose. Talk to your care team about effective forms of contraception. Do not breast-feed while taking this medication. What side effects may I notice from receiving this medication? Side effects that you should report to your care team as soon as   possible: Allergic reactions--skin rash, itching, hives, swelling of the face, lips, tongue, or throat Heart rhythm changes--fast or irregular heartbeat, dizziness, feeling faint or lightheaded, chest pain, trouble breathing Infection--fever, chills, cough, sore throat, wounds that  don't heal, pain or trouble when passing urine, general feeling of discomfort or being unwell Infusion reactions--chest pain, shortness of breath or trouble breathing, feeling faint or lightheaded Sudden eye pain or change in vision such as blurry vision, seeing halos around lights, vision loss Unusual bruising or bleeding Side effects that usually do not require medical attention (report to your care team if they continue or are bothersome): Constipation Diarrhea Fatigue Nausea Pain, tingling, or numbness in the hands or feet Swelling of the ankles, hands, or feet This list may not describe all possible side effects. Call your doctor for medical advice about side effects. You may report side effects to FDA at 1-800-FDA-1088. Where should I keep my medication? This medication is given in a hospital or clinic. It will not be stored at home. NOTE: This sheet is a summary. It may not cover all possible information. If you have questions about this medicine, talk to your doctor, pharmacist, or health care provider.  2024 Elsevier/Gold Standard (2021-11-28 00:00:00)  

## 2023-03-06 ENCOUNTER — Telehealth: Payer: Self-pay | Admitting: Hematology and Oncology

## 2023-03-06 DIAGNOSIS — J45909 Unspecified asthma, uncomplicated: Secondary | ICD-10-CM | POA: Diagnosis not present

## 2023-03-06 DIAGNOSIS — F419 Anxiety disorder, unspecified: Secondary | ICD-10-CM | POA: Diagnosis not present

## 2023-03-06 DIAGNOSIS — M8458XD Pathological fracture in neoplastic disease, other specified site, subsequent encounter for fracture with routine healing: Secondary | ICD-10-CM | POA: Diagnosis not present

## 2023-03-06 DIAGNOSIS — C9030 Solitary plasmacytoma not having achieved remission: Secondary | ICD-10-CM | POA: Diagnosis not present

## 2023-03-06 DIAGNOSIS — M1990 Unspecified osteoarthritis, unspecified site: Secondary | ICD-10-CM | POA: Diagnosis not present

## 2023-03-06 DIAGNOSIS — Z79899 Other long term (current) drug therapy: Secondary | ICD-10-CM | POA: Diagnosis not present

## 2023-03-08 ENCOUNTER — Other Ambulatory Visit: Payer: Self-pay | Admitting: *Deleted

## 2023-03-08 ENCOUNTER — Inpatient Hospital Stay: Payer: Medicare Other | Admitting: Hematology and Oncology

## 2023-03-08 ENCOUNTER — Other Ambulatory Visit: Payer: Self-pay

## 2023-03-08 ENCOUNTER — Inpatient Hospital Stay: Payer: Medicare Other | Attending: Hematology and Oncology

## 2023-03-08 DIAGNOSIS — Z79620 Long term (current) use of immunosuppressive biologic: Secondary | ICD-10-CM | POA: Diagnosis not present

## 2023-03-08 DIAGNOSIS — E099 Drug or chemical induced diabetes mellitus without complications: Secondary | ICD-10-CM

## 2023-03-08 DIAGNOSIS — Z79899 Other long term (current) drug therapy: Secondary | ICD-10-CM | POA: Diagnosis not present

## 2023-03-08 DIAGNOSIS — C9000 Multiple myeloma not having achieved remission: Secondary | ICD-10-CM

## 2023-03-08 DIAGNOSIS — Z5112 Encounter for antineoplastic immunotherapy: Secondary | ICD-10-CM | POA: Diagnosis not present

## 2023-03-08 DIAGNOSIS — Z0000 Encounter for general adult medical examination without abnormal findings: Secondary | ICD-10-CM

## 2023-03-08 LAB — CMP (CANCER CENTER ONLY)
ALT: 9 U/L (ref 0–44)
AST: 19 U/L (ref 15–41)
Albumin: 4.2 g/dL (ref 3.5–5.0)
Alkaline Phosphatase: 107 U/L (ref 38–126)
Anion gap: 9 (ref 5–15)
BUN: 11 mg/dL (ref 8–23)
CO2: 25 mmol/L (ref 22–32)
Calcium: 8.7 mg/dL — ABNORMAL LOW (ref 8.9–10.3)
Chloride: 104 mmol/L (ref 98–111)
Creatinine: 0.8 mg/dL (ref 0.44–1.00)
GFR, Estimated: >60 mL/min (ref >60)
Glucose, Bld: 167 mg/dL — ABNORMAL HIGH (ref 70–99)
Potassium: 4.4 mmol/L (ref 3.5–5.1)
Sodium: 138 mmol/L (ref 135–145)
Total Bilirubin: 0.6 mg/dL (ref 0.3–1.2)
Total Protein: 6.4 g/dL — ABNORMAL LOW (ref 6.5–8.1)

## 2023-03-08 LAB — CBC WITH DIFFERENTIAL (CANCER CENTER ONLY)
Abs Immature Granulocytes: 0.02 K/uL (ref 0.00–0.07)
Basophils Absolute: 0 K/uL (ref 0.0–0.1)
Basophils Relative: 0 %
Eosinophils Absolute: 0.1 K/uL (ref 0.0–0.5)
Eosinophils Relative: 1 %
HCT: 39.7 % (ref 36.0–46.0)
Hemoglobin: 13.6 g/dL (ref 12.0–15.0)
Immature Granulocytes: 0 %
Lymphocytes Relative: 6 %
Lymphs Abs: 0.7 K/uL (ref 0.7–4.0)
MCH: 34.4 pg — ABNORMAL HIGH (ref 26.0–34.0)
MCHC: 34.3 g/dL (ref 30.0–36.0)
MCV: 100.5 fL — ABNORMAL HIGH (ref 80.0–100.0)
Monocytes Absolute: 0.7 K/uL (ref 0.1–1.0)
Monocytes Relative: 6 %
Neutro Abs: 9.6 K/uL — ABNORMAL HIGH (ref 1.7–7.7)
Neutrophils Relative %: 87 %
Platelet Count: 254 K/uL (ref 150–400)
RBC: 3.95 MIL/uL (ref 3.87–5.11)
RDW: 12.1 % (ref 11.5–15.5)
WBC Count: 11.1 K/uL — ABNORMAL HIGH (ref 4.0–10.5)
nRBC: 0 % (ref 0.0–0.2)

## 2023-03-08 LAB — HEMOGLOBIN A1C
Hgb A1c MFr Bld: 6 % — ABNORMAL HIGH (ref 4.8–5.6)
Mean Plasma Glucose: 125.5 mg/dL

## 2023-03-08 MED ORDER — DEXAMETHASONE 4 MG PO TABS: ORAL_TABLET | ORAL | 3 refills | Status: DC

## 2023-03-08 NOTE — Progress Notes (Unsigned)
[facility] [phone]   [phone]  PROGRESS NOTE  Patient Care Team: [name], MD as PCP - General (Family Medicine) Pickenpack-Cousar, [name], NP as Nurse Practitioner (Nurse Practitioner)  Hematological/Oncological History # Plasmacytoma of Thoracic Spine  # IgG Kappa Multiple myeloma 09/22/2022: Patient underwent MRI thoracic spine which showed acute or subacute T7, T10, and T11 compression fractures 10/10/2022: biopsy of compression fracture of T11 reveals a plasmacytoma 11/13/2022: establish care with Dr. Leonides Schanz  12/06/2022: bone marrow biopsy showed plasma cell myeloma involving 80% of the marrow.  12/24/2022: Cycle 1 Day 1 of Velcade/Dex 01/14/2023: Cycle 2 Day 1 of Velcade/Dex 02/04/2023: Cycle 3 Day 1 of Velcade/Dex 02/26/2023: Cycle 4 Day 1 of Velcade/Dex HELD due to ocular toxicity 03/04/2023: Cycle 1 Day 1 of Dara/Dex   Interval History:  Alice Anthony 82 y.o. female with medical history significant for newly diagnosed IgG kappa multiple myeloma who presents for a follow up visit. The patient's last visit was on 02/26/2023.  In the interim since the last visit she has continued on Velcade therapy.   On exam today [name] reports she is status post her first dose of daratumumab.  She reports that her eye stye is improving though it is still little painful.  She is complete her antibiotics and her drops but continues to hot compresses.  She reports that the Darzalex has not caused any side effects and she could not tell the difference.  She is having a little bit of tiredness after her shots and having a little bit of redness at her injection site but no itching or bruising.  She reports her appetite is good and her energy is strong.  She reports that she feels like she is steadily improving.  She otherwise is at her baseline level of health with no questions concerns or complaints today.. She denies any fevers, chills, sweats, shortness of  breath, chest pain or cough.  A full 10 point ROS is otherwise negative.   MEDICAL HISTORY:  Past Medical History:  Diagnosis Date   Anxiety    Arthritis    Asthma    Hx: of   Cancer (HCC)    Hx: of skin cancer   Diverticulosis    GERD (gastroesophageal reflux disease)    Hx: of   History of kidney stones    Hyperlipemia    Hypertension    Pre-diabetes     SURGICAL HISTORY: Past Surgical History:  Procedure Laterality Date   ABDOMINAL SURGERY  09/19/2012   Ruptured Diverticuli, Ostomy Placed.   BREAST BIOPSY  06/06/2012   breast biopsy   CATARACT EXTRACTION W/ INTRAOCULAR LENS IMPLANT Bilateral    COLONOSCOPY  03/06/2012   COLOSTOMY CLOSURE  12/25/2012   Dr Luisa Hart   COLOSTOMY CLOSURE N/A 12/25/2012   Procedure: COLOSTOMY CLOSURE;  Surgeon: Clovis Pu. Cornett, MD;  Location: [facility] OR;  Service: General;  Laterality: N/A;   EXTRACORPOREAL SHOCK WAVE LITHOTRIPSY Left 11/17/2018   Procedure: EXTRACORPOREAL SHOCK WAVE LITHOTRIPSY (ESWL);  Surgeon: Malen Gauze, MD;  Location: [facility] ORS;  Service: Urology;  Laterality: Left;   EYE SURGERY  10/19/1998   Hx: of cyst removed from left eye   KYPHOPLASTY N/A 10/10/2022   Procedure: KYPHOPLASTY Thoracic seven , Thoracic ten,Thoracic eleven;  Surgeon: Tressie Stalker, MD;  Location[facility];  Service: Neurosurgery;  Laterality: N/A;   LAPAROTOMY N/A 09/19/2012   Procedure: EXPLORATORY LAPAROTOMY, sigmoid colectomy;  Surgeon: Clovis Pu. Cornett, MD;  Location: [facility] ORS;  Service: General;  Laterality: N/A;   SIGMOIDOSCOPY  01/11/1999   TONSILLECTOMY     TRIGGER FINGER RELEASE  12/25/2010   TUBAL LIGATION  10/16/1985    SOCIAL HISTORY: Social History   Socioeconomic History   Marital status: Widowed    Spouse name: Not on file   Number of children: Not on file   Years of education: Not on file   Highest education level: Not on file  Occupational History   Not on file  Tobacco Use   Smoking status: Never   Smokeless tobacco:  Never  Vaping Use   Vaping status: Never Used  Substance and Sexual Activity   Alcohol use: No   Drug use: No   Sexual activity: Not Currently  Other Topics Concern   Not on file  Social History Narrative   Not on file   Social Determinants of Health   Financial Resource Strain: Not on file  Food Insecurity: Not on file  Transportation Needs: Not on file  Physical Activity: Not on file  Stress: Not on file  Social Connections: Not on file  Intimate Partner Violence: Not on file    FAMILY HISTORY: Family History  Problem Relation Age of Onset   Diabetes Father    Alzheimer's disease Father    Basal cell carcinoma Mother        Metastatic    ALLERGIES:  is allergic to hydrocodone, vioxx [rofecoxib], codeine, doxycycline hyclate, and latex.  MEDICATIONS:  Current Outpatient Medications  Medication Sig Dispense Refill   acetaminophen (TYLENOL) 500 MG tablet Take 1,000 mg by mouth every 6 (six) hours as needed for moderate pain. (Patient not taking: Reported on 02/12/2023)     acyclovir (ZOVIRAX) 400 MG tablet Take 1 tablet (400 mg total) by mouth 2 (two) times daily. 180 tablet 1   ALPRAZolam (XANAX) 0.25 MG tablet Take 0.25 mg by mouth 3 (three) times daily as needed.     amLODipine (NORVASC) 5 MG tablet Take 5 mg by mouth daily.     aspirin 81 MG tablet Take 81 mg by mouth daily.     bacitracin-neomycin-polymyxin-hydrocortisone (CORTISPORIN) 1 % ophthalmic ointment Place 1 Application into the right eye 2 (two) times daily. (Patient taking differently: Place 1 Application into the right eye 2 (two) times daily. Direction on bottle are three times a day for 7 days) 3.5 g 0   cetirizine (ZYRTEC) 10 MG tablet Take 10 mg by mouth at bedtime.     Cholecalciferol (VITAMIN D3) 2000 UNITS capsule Take 2,000 Units by mouth daily.     dexamethasone (DECADRON) 4 MG tablet Take 40 mg (10 tablets) by mouth once a week. 40 tablet 3   dextromethorphan-guaiFENesin (TUSSIN DM) 10-100 MG/5ML  liquid Take 10 mLs by mouth every 4 (four) hours as needed for cough.     fluticasone (FLONASE) 50 MCG/ACT nasal spray Place 2 sprays into the nose at bedtime.     glimepiride (AMARYL) 2 MG tablet Take 1-2 mg by mouth daily as needed (high blood sugar).     hydrOXYzine (ATARAX) 25 MG tablet Take 25 mg by mouth at bedtime.     metoCLOPramide (REGLAN) 10 MG tablet Take 1 tablet (10 mg total) by mouth every 6 (six) hours. (Patient not taking: Reported on 02/26/2023) 60 tablet 0   Probiotic Product (ALIGN PO) Take by mouth daily.     prochlorperazine (COMPAZINE) 10 MG tablet Take 1 tablet (10 mg total) by mouth every 6 (six) hours as needed for  nausea or vomiting. (Patient not taking: Reported on 02/12/2023) 30 tablet 0   simvastatin (ZOCOR) 20 MG tablet Take 20 mg by mouth every evening.     traMADol (ULTRAM) 50 MG tablet TAKE 1 TO 2 TABLETS BY MOUTH EVERY 4 HOURS AS NEEDED (Patient taking differently: Take 50-100 mg by mouth every 4 (four) hours as needed. Currently taking 2 tablets PO daily) 90 tablet 0   No current facility-administered medications for this visit.    REVIEW OF SYSTEMS:   Constitutional: ( - ) fevers, ( - )  chills , ( - ) night sweats Eyes: ( - ) blurriness of vision, ( - ) double vision, ( - ) watery eyes Ears, nose, mouth, throat, and face: ( - ) mucositis, ( - ) sore throat Respiratory: ( - ) cough, ( - ) dyspnea, ( - ) wheezes Cardiovascular: ( - ) palpitation, ( - ) chest discomfort, ( - ) lower extremity swelling Gastrointestinal:  ( - ) nausea, ( - ) heartburn, ( - ) change in bowel habits Skin: ( - ) abnormal skin rashes Lymphatics: ( - ) new lymphadenopathy, ( - ) easy bruising Neurological: ( - ) numbness, ( - ) tingling, ( - ) new weaknesses Behavioral/Psych: ( - ) mood change, ( - ) new changes  All other systems were reviewed with the patient and are negative.  PHYSICAL EXAMINATION: ECOG PERFORMANCE STATUS: 1 - Symptomatic but completely ambulatory  Vitals:    03/08/23 1440  BP: 121/63  Pulse: 91  Resp: 15  Temp: 97.9 F (36.6 C)  SpO2: 99%     Filed Weights   03/08/23 1440  Weight: 121 lb 9.6 oz (55.2 kg)     GENERAL: Well-appearing elderly Caucasian female, alert, no distress and comfortable SKIN: skin color, texture, turgor are normal, no rashes or significant lesions EYES: conjunctiva are pink and non-injected, sclera clear. Stye on lower eyelid of right eye that is erythematous. No discharge noted.  LUNGS: clear to auscultation and percussion with normal breathing effort HEART: regular rate & rhythm and no murmurs and no lower extremity edema Musculoskeletal: no cyanosis of digits and no clubbing  PSYCH: alert & oriented x 3, fluent speech NEURO: no focal motor/sensory deficits  LABORATORY DATA:  I have reviewed the data as listed    Latest Ref Rng & Units 03/08/2023    2:12 PM 03/04/2023    9:27 AM 02/26/2023   10:53 AM  CBC  WBC 4.0 - 10.5 K/uL 11.1  4.7  3.6   Hemoglobin 12.0 - 15.0 g/dL 16.6  06.3  01.6   Hematocrit 36.0 - 46.0 % 39.7  36.9  36.9   Platelets 150 - 400 K/uL 254  302  211        Latest Ref Rng & Units 03/08/2023    2:12 PM 03/04/2023    9:27 AM 02/26/2023   10:53 AM  CMP  Glucose 70 - 99 mg/dL 010  932  355   BUN 8 - 23 mg/dL 11  14  9    Creatinine 0.44 - 1.00 mg/dL 7.32  2.02  5.42   Sodium 135 - 145 mmol/L 138  140  141   Potassium 3.5 - 5.1 mmol/L 4.4  4.2  4.0   Chloride 98 - 111 mmol/L 104  107  108   CO2 22 - 32 mmol/L 25  27  26    Calcium 8.9 - 10.3 mg/dL 8.7  9.2  9.0   Total Protein 6.5 -  8.1 g/dL 6.4  5.6  5.9   Total Bilirubin 0.3 - 1.2 mg/dL 0.6  0.6  0.6   Alkaline Phos 38 - 126 U/L 107  109  116   AST 15 - 41 U/L 19  11  13    ALT 0 - 44 U/L 9  8  8      Lab Results  Component Value Date   MPROTEIN Not Observed 02/26/2023   MPROTEIN Not Observed 01/28/2023   MPROTEIN 0.1 (H) 11/14/2022   Lab Results  Component Value Date   KPAFRELGTCHN 235.6 (H) 02/26/2023   KPAFRELGTCHN 714.1  (H) 01/28/2023   KPAFRELGTCHN 4,793.0 (H) 11/14/2022   LAMBDASER 3.3 (L) 02/26/2023   LAMBDASER 2.9 (L) 01/28/2023   LAMBDASER 2.6 (L) 11/14/2022   KAPLAMBRATIO 71.39 (H) 02/26/2023   KAPLAMBRATIO 246.24 (H) 01/28/2023   KAPLAMBRATIO 735.45 (H) 11/29/2022    RADIOGRAPHIC STUDIES: No results found.  ASSESSMENT & PLAN VILA DORY 82 y.o. female with medical history significant for IgG kappa multiple myeloma who presents for a follow up visit.   # IgG Kappa Multiple Myeloma # Plasmacytoma of Spine  -- Patient diagnosed with plasmacytoma of the spine, subsequent bone marrow biopsy confirmed an IgG kappa multiple myeloma involving the bone marrow --start of Velcade/Dex Cycle 1 Day 1 on 12/24/2022. Plan: -- Labs today show creatinine 0.8 we need to be on her again this week, WBC 11.1, Hgb 13.6, MCV 100.5, Plt 254 --Due to ocular toxicity, recommend to discontinue Velcade therapy and switch to Daratumumab.  --Can consider the addition of Revlimid to the patient's treatment regimen at a later time. -- Return to clinic in 2 weeks with continued Dara/Dex.   #Ocular toxicity/Stye --Secondary to Velcade therapy --Currently on Moxifloxacin drops with improvement --Sent azithromycin x 5 days.   #Supportive Care -- chemotherapy education complete -- port placement not required   -- zofran 8mg  q8H PRN and compazine 10mg  PO q6H for nausea -- acyclovir 400mg  PO BID for VCZ prophylaxis -- Recieved dental clearance prior to the start of Xgeva/Zometa. First dose on 01/07/2023.  Next due Sept 2024.  -- Tramadol 50 mg every 6 hours as needed for pain control   No orders of the defined types were placed in this encounter.   All questions were answered. The patient knows to call the clinic with any problems, questions or concerns.  I have spent a total of 30 minutes minutes of face-to-face and non-face-to-face time, preparing to see the patient, performing a medically appropriate examination,  counseling and educating the patient,  documenting clinical information in the electronic health record,and care coordination.   Ulysees Barns, MD Department of Hematology/Oncology [facility] Phone: [phone] Pager: [phone] Email: [email or web address]   03/10/2023 8:29 PM

## 2023-03-10 ENCOUNTER — Encounter: Payer: Self-pay | Admitting: Hematology and Oncology

## 2023-03-11 ENCOUNTER — Ambulatory Visit: Payer: Medicare Other

## 2023-03-11 ENCOUNTER — Inpatient Hospital Stay: Payer: Medicare Other | Admitting: Hematology and Oncology

## 2023-03-11 ENCOUNTER — Inpatient Hospital Stay: Payer: Medicare Other

## 2023-03-11 ENCOUNTER — Encounter: Payer: Self-pay | Admitting: Hematology and Oncology

## 2023-03-11 VITALS — BP 118/61 | HR 84 | Temp 97.4°F | Resp 16

## 2023-03-11 DIAGNOSIS — C9000 Multiple myeloma not having achieved remission: Secondary | ICD-10-CM | POA: Diagnosis not present

## 2023-03-11 DIAGNOSIS — Z79620 Long term (current) use of immunosuppressive biologic: Secondary | ICD-10-CM | POA: Diagnosis not present

## 2023-03-11 DIAGNOSIS — Z79899 Other long term (current) drug therapy: Secondary | ICD-10-CM | POA: Diagnosis not present

## 2023-03-11 DIAGNOSIS — Z5112 Encounter for antineoplastic immunotherapy: Secondary | ICD-10-CM | POA: Diagnosis not present

## 2023-03-11 MED ORDER — DARATUMUMAB-HYALURONIDASE-FIHJ 1800-30000 MG-UT/15ML ~~LOC~~ SOLN
1800.0000 mg | Freq: Once | SUBCUTANEOUS | Status: AC
  Administered 2023-03-11: 1800 mg via SUBCUTANEOUS
  Filled 2023-03-11: qty 15

## 2023-03-11 MED ORDER — MONTELUKAST SODIUM 10 MG PO TABS
10.0000 mg | ORAL_TABLET | Freq: Once | ORAL | Status: AC
  Administered 2023-03-11: 10 mg via ORAL
  Filled 2023-03-11: qty 1

## 2023-03-11 MED ORDER — ACETAMINOPHEN 325 MG PO TABS
650.0000 mg | ORAL_TABLET | Freq: Once | ORAL | Status: AC
  Administered 2023-03-11: 650 mg via ORAL
  Filled 2023-03-11: qty 2

## 2023-03-11 MED ORDER — DIPHENHYDRAMINE HCL 25 MG PO CAPS
50.0000 mg | ORAL_CAPSULE | Freq: Once | ORAL | Status: AC
  Administered 2023-03-11: 50 mg via ORAL
  Filled 2023-03-11: qty 2

## 2023-03-11 NOTE — Progress Notes (Signed)
Dexamethasone -Pt took her dexamethasone 20 mg today at 0800. She also took Tramadol and xanax this am.

## 2023-03-11 NOTE — Patient Instructions (Signed)
Daratumumab; Hyaluronidase Injection What is this medication? DARATUMUMAB; HYALURONIDASE (dar a toom ue mab; hye al ur ON i dase) treats multiple myeloma, a type of bone marrow cancer. Daratumumab works by blocking a protein that causes cancer cells to grow and multiply. This helps to slow or stop the spread of cancer cells. Hyaluronidase works by increasing the absorption of other medications in the body to help them work better. This medication may also be used treat amyloidosis, a condition that causes the buildup of a protein (amyloid) in your body. It works by reducing the buildup of this protein, which decreases symptoms. It is a combination medication that contains a monoclonal antibody. This medicine may be used for other purposes; ask your health care provider or pharmacist if you have questions. COMMON BRAND NAME(S): DARZALEX FASPRO What should I tell my care team before I take this medication? They need to know if you have any of these conditions: Heart disease Infection, such as chickenpox, cold sores, herpes, hepatitis B Lung or breathing disease An unusual or allergic reaction to daratumumab, hyaluronidase, other medications, foods, dyes, or preservatives Pregnant or trying to get pregnant Breast-feeding How should I use this medication? This medication is injected under the skin. It is given by your care team in a hospital or clinic setting. Talk to your care team about the use of this medication in children. Special care may be needed. Overdosage: If you think you have taken too much of this medicine contact a poison control center or emergency room at once. NOTE: This medicine is only for you. Do not share this medicine with others. What if I miss a dose? Keep appointments for follow-up doses. It is important not to miss your dose. Call your care team if you are unable to keep an appointment. What may interact with this medication? Interactions have not been studied. This list  may not describe all possible interactions. Give your health care provider a list of all the medicines, herbs, non-prescription drugs, or dietary supplements you use. Also tell them if you smoke, drink alcohol, or use illegal drugs. Some items may interact with your medicine. What should I watch for while using this medication? Your condition will be monitored carefully while you are receiving this medication. This medication can cause serious allergic reactions. To reduce your risk, your care team may give you other medication to take before receiving this one. Be sure to follow the directions from your care team. This medication can affect the results of blood tests to match your blood type. These changes can last for up to 6 months after the final dose. Your care team will do blood tests to match your blood type before you start treatment. Tell all of your care team that you are being treated with this medication before receiving a blood transfusion. This medication can affect the results of some tests used to determine treatment response; extra tests may be needed to evaluate response. Talk to your care team if you wish to become pregnant or think you are pregnant. This medication can cause serious birth defects if taken during pregnancy and for 3 months after the last dose. A reliable form of contraception is recommended while taking this medication and for 3 months after the last dose. Talk to your care team about effective forms of contraception. Do not breast-feed while taking this medication. What side effects may I notice from receiving this medication? Side effects that you should report to your care team as soon as   possible: Allergic reactions--skin rash, itching, hives, swelling of the face, lips, tongue, or throat Heart rhythm changes--fast or irregular heartbeat, dizziness, feeling faint or lightheaded, chest pain, trouble breathing Infection--fever, chills, cough, sore throat, wounds that  don't heal, pain or trouble when passing urine, general feeling of discomfort or being unwell Infusion reactions--chest pain, shortness of breath or trouble breathing, feeling faint or lightheaded Sudden eye pain or change in vision such as blurry vision, seeing halos around lights, vision loss Unusual bruising or bleeding Side effects that usually do not require medical attention (report to your care team if they continue or are bothersome): Constipation Diarrhea Fatigue Nausea Pain, tingling, or numbness in the hands or feet Swelling of the ankles, hands, or feet This list may not describe all possible side effects. Call your doctor for medical advice about side effects. You may report side effects to FDA at 1-800-FDA-1088. Where should I keep my medication? This medication is given in a hospital or clinic. It will not be stored at home. NOTE: This sheet is a summary. It may not cover all possible information. If you have questions about this medicine, talk to your doctor, pharmacist, or health care provider.  2024 Elsevier/Gold Standard (2021-11-28 00:00:00)  

## 2023-03-12 ENCOUNTER — Ambulatory Visit: Payer: Medicare Other

## 2023-03-13 DIAGNOSIS — M8458XD Pathological fracture in neoplastic disease, other specified site, subsequent encounter for fracture with routine healing: Secondary | ICD-10-CM | POA: Diagnosis not present

## 2023-03-13 DIAGNOSIS — J45909 Unspecified asthma, uncomplicated: Secondary | ICD-10-CM | POA: Diagnosis not present

## 2023-03-13 DIAGNOSIS — C9030 Solitary plasmacytoma not having achieved remission: Secondary | ICD-10-CM | POA: Diagnosis not present

## 2023-03-13 DIAGNOSIS — Z79899 Other long term (current) drug therapy: Secondary | ICD-10-CM | POA: Diagnosis not present

## 2023-03-13 DIAGNOSIS — M1990 Unspecified osteoarthritis, unspecified site: Secondary | ICD-10-CM | POA: Diagnosis not present

## 2023-03-13 DIAGNOSIS — F419 Anxiety disorder, unspecified: Secondary | ICD-10-CM | POA: Diagnosis not present

## 2023-03-18 ENCOUNTER — Inpatient Hospital Stay: Payer: Medicare Other

## 2023-03-18 ENCOUNTER — Encounter: Payer: Self-pay | Admitting: General Practice

## 2023-03-18 ENCOUNTER — Other Ambulatory Visit: Payer: Medicare Other

## 2023-03-18 ENCOUNTER — Ambulatory Visit: Payer: Medicare Other

## 2023-03-18 ENCOUNTER — Other Ambulatory Visit: Payer: Self-pay

## 2023-03-18 VITALS — BP 120/64 | HR 95 | Temp 97.8°F | Resp 16 | Wt 120.8 lb

## 2023-03-18 DIAGNOSIS — C9000 Multiple myeloma not having achieved remission: Secondary | ICD-10-CM

## 2023-03-18 DIAGNOSIS — Z79899 Other long term (current) drug therapy: Secondary | ICD-10-CM | POA: Diagnosis not present

## 2023-03-18 DIAGNOSIS — Z5112 Encounter for antineoplastic immunotherapy: Secondary | ICD-10-CM | POA: Diagnosis not present

## 2023-03-18 DIAGNOSIS — Z79620 Long term (current) use of immunosuppressive biologic: Secondary | ICD-10-CM | POA: Diagnosis not present

## 2023-03-18 LAB — CBC WITH DIFFERENTIAL (CANCER CENTER ONLY)
Abs Immature Granulocytes: 0.02 10*3/uL (ref 0.00–0.07)
Basophils Absolute: 0 10*3/uL (ref 0.0–0.1)
Basophils Relative: 0 %
Eosinophils Absolute: 0 10*3/uL (ref 0.0–0.5)
Eosinophils Relative: 0 %
HCT: 40.6 % (ref 36.0–46.0)
Hemoglobin: 14.2 g/dL (ref 12.0–15.0)
Immature Granulocytes: 0 %
Lymphocytes Relative: 2 %
Lymphs Abs: 0.1 10*3/uL — ABNORMAL LOW (ref 0.7–4.0)
MCH: 34.4 pg — ABNORMAL HIGH (ref 26.0–34.0)
MCHC: 35 g/dL (ref 30.0–36.0)
MCV: 98.3 fL (ref 80.0–100.0)
Monocytes Absolute: 0 10*3/uL — ABNORMAL LOW (ref 0.1–1.0)
Monocytes Relative: 0 %
Neutro Abs: 6 10*3/uL (ref 1.7–7.7)
Neutrophils Relative %: 98 %
Platelet Count: 220 10*3/uL (ref 150–400)
RBC: 4.13 MIL/uL (ref 3.87–5.11)
RDW: 11.9 % (ref 11.5–15.5)
WBC Count: 6.2 10*3/uL (ref 4.0–10.5)
nRBC: 0 % (ref 0.0–0.2)

## 2023-03-18 LAB — CMP (CANCER CENTER ONLY)
ALT: 7 U/L (ref 0–44)
AST: 10 U/L — ABNORMAL LOW (ref 15–41)
Albumin: 4.3 g/dL (ref 3.5–5.0)
Alkaline Phosphatase: 100 U/L (ref 38–126)
Anion gap: 9 (ref 5–15)
BUN: 17 mg/dL (ref 8–23)
CO2: 23 mmol/L (ref 22–32)
Calcium: 8.8 mg/dL — ABNORMAL LOW (ref 8.9–10.3)
Chloride: 106 mmol/L (ref 98–111)
Creatinine: 0.74 mg/dL (ref 0.44–1.00)
GFR, Estimated: >60 mL/min (ref >60)
Glucose, Bld: 331 mg/dL — ABNORMAL HIGH (ref 70–99)
Potassium: 3.9 mmol/L (ref 3.5–5.1)
Sodium: 138 mmol/L (ref 135–145)
Total Bilirubin: 0.6 mg/dL (ref 0.3–1.2)
Total Protein: 6.4 g/dL — ABNORMAL LOW (ref 6.5–8.1)

## 2023-03-18 MED ORDER — DEXAMETHASONE 4 MG PO TABS
20.0000 mg | ORAL_TABLET | Freq: Once | ORAL | Status: DC
  Filled 2023-03-18: qty 5

## 2023-03-18 MED ORDER — ACETAMINOPHEN 325 MG PO TABS
650.0000 mg | ORAL_TABLET | Freq: Once | ORAL | Status: AC
  Administered 2023-03-18: 650 mg via ORAL
  Filled 2023-03-18: qty 2

## 2023-03-18 MED ORDER — MONTELUKAST SODIUM 10 MG PO TABS
10.0000 mg | ORAL_TABLET | Freq: Once | ORAL | Status: AC
  Administered 2023-03-18: 10 mg via ORAL
  Filled 2023-03-18: qty 1

## 2023-03-18 MED ORDER — DIPHENHYDRAMINE HCL 25 MG PO CAPS
50.0000 mg | ORAL_CAPSULE | Freq: Once | ORAL | Status: AC
  Administered 2023-03-18: 50 mg via ORAL
  Filled 2023-03-18: qty 2

## 2023-03-18 MED ORDER — DARATUMUMAB-HYALURONIDASE-FIHJ 1800-30000 MG-UT/15ML ~~LOC~~ SOLN
1800.0000 mg | Freq: Once | SUBCUTANEOUS | Status: AC
  Administered 2023-03-18: 1800 mg via SUBCUTANEOUS
  Filled 2023-03-18: qty 15

## 2023-03-18 NOTE — Progress Notes (Signed)
Pt informed this RN and Equities trader that she took dexamethasone at home prior to her appts today.

## 2023-03-18 NOTE — Progress Notes (Signed)
[facility] Spiritual Care Note  Reached Ms Bentle by phone for pastoral check-in because our schedules don't align for an in-person visit during her treatment this afternoon. She was appreciative of call and shared that her new treatment is more difficult to tolerate overall than the previous one, though she certainly gives thanks for the preservation of her vision. She continues to use such perspective and gratitude to cope with diagnosis and treatment. Faith, prayer, and looking toward the positive are key meaning-making tools for her.  We plan to follow up on Monday 8/19 in infusion.   [address] [place], [place], [facility] Pager [phone] Voicemail [phone]

## 2023-03-18 NOTE — Patient Instructions (Signed)

## 2023-03-20 DIAGNOSIS — F419 Anxiety disorder, unspecified: Secondary | ICD-10-CM | POA: Diagnosis not present

## 2023-03-20 DIAGNOSIS — C9030 Solitary plasmacytoma not having achieved remission: Secondary | ICD-10-CM | POA: Diagnosis not present

## 2023-03-20 DIAGNOSIS — J45909 Unspecified asthma, uncomplicated: Secondary | ICD-10-CM | POA: Diagnosis not present

## 2023-03-20 DIAGNOSIS — M8458XD Pathological fracture in neoplastic disease, other specified site, subsequent encounter for fracture with routine healing: Secondary | ICD-10-CM | POA: Diagnosis not present

## 2023-03-20 DIAGNOSIS — M1990 Unspecified osteoarthritis, unspecified site: Secondary | ICD-10-CM | POA: Diagnosis not present

## 2023-03-20 DIAGNOSIS — Z79899 Other long term (current) drug therapy: Secondary | ICD-10-CM | POA: Diagnosis not present

## 2023-03-22 DIAGNOSIS — Z79891 Long term (current) use of opiate analgesic: Secondary | ICD-10-CM | POA: Diagnosis not present

## 2023-03-22 DIAGNOSIS — K219 Gastro-esophageal reflux disease without esophagitis: Secondary | ICD-10-CM | POA: Diagnosis not present

## 2023-03-22 DIAGNOSIS — Z85828 Personal history of other malignant neoplasm of skin: Secondary | ICD-10-CM | POA: Diagnosis not present

## 2023-03-22 DIAGNOSIS — Z7982 Long term (current) use of aspirin: Secondary | ICD-10-CM | POA: Diagnosis not present

## 2023-03-22 DIAGNOSIS — F419 Anxiety disorder, unspecified: Secondary | ICD-10-CM | POA: Diagnosis not present

## 2023-03-22 DIAGNOSIS — J45909 Unspecified asthma, uncomplicated: Secondary | ICD-10-CM | POA: Diagnosis not present

## 2023-03-22 DIAGNOSIS — K5903 Drug induced constipation: Secondary | ICD-10-CM | POA: Diagnosis not present

## 2023-03-22 DIAGNOSIS — Z853 Personal history of malignant neoplasm of breast: Secondary | ICD-10-CM | POA: Diagnosis not present

## 2023-03-22 DIAGNOSIS — E785 Hyperlipidemia, unspecified: Secondary | ICD-10-CM | POA: Diagnosis not present

## 2023-03-22 DIAGNOSIS — M1990 Unspecified osteoarthritis, unspecified site: Secondary | ICD-10-CM | POA: Diagnosis not present

## 2023-03-22 DIAGNOSIS — Z7984 Long term (current) use of oral hypoglycemic drugs: Secondary | ICD-10-CM | POA: Diagnosis not present

## 2023-03-22 DIAGNOSIS — Z79899 Other long term (current) drug therapy: Secondary | ICD-10-CM | POA: Diagnosis not present

## 2023-03-22 DIAGNOSIS — I10 Essential (primary) hypertension: Secondary | ICD-10-CM | POA: Diagnosis not present

## 2023-03-22 DIAGNOSIS — M8458XD Pathological fracture in neoplastic disease, other specified site, subsequent encounter for fracture with routine healing: Secondary | ICD-10-CM | POA: Diagnosis not present

## 2023-03-22 DIAGNOSIS — Z87442 Personal history of urinary calculi: Secondary | ICD-10-CM | POA: Diagnosis not present

## 2023-03-22 DIAGNOSIS — C9030 Solitary plasmacytoma not having achieved remission: Secondary | ICD-10-CM | POA: Diagnosis not present

## 2023-03-22 DIAGNOSIS — R7303 Prediabetes: Secondary | ICD-10-CM | POA: Diagnosis not present

## 2023-03-22 DIAGNOSIS — K5790 Diverticulosis of intestine, part unspecified, without perforation or abscess without bleeding: Secondary | ICD-10-CM | POA: Diagnosis not present

## 2023-03-25 ENCOUNTER — Inpatient Hospital Stay: Payer: Medicare Other

## 2023-03-25 ENCOUNTER — Inpatient Hospital Stay (HOSPITAL_BASED_OUTPATIENT_CLINIC_OR_DEPARTMENT_OTHER): Payer: Medicare Other | Admitting: Nurse Practitioner

## 2023-03-25 ENCOUNTER — Encounter: Payer: Self-pay | Admitting: Nurse Practitioner

## 2023-03-25 ENCOUNTER — Telehealth: Payer: Self-pay | Admitting: Medical Oncology

## 2023-03-25 ENCOUNTER — Inpatient Hospital Stay (HOSPITAL_BASED_OUTPATIENT_CLINIC_OR_DEPARTMENT_OTHER): Payer: Medicare Other | Admitting: Hematology and Oncology

## 2023-03-25 VITALS — BP 137/71 | HR 89 | Temp 97.9°F | Resp 17 | Wt 121.1 lb

## 2023-03-25 VITALS — BP 124/65 | HR 97 | Temp 98.2°F | Resp 16

## 2023-03-25 DIAGNOSIS — G893 Neoplasm related pain (acute) (chronic): Secondary | ICD-10-CM | POA: Diagnosis not present

## 2023-03-25 DIAGNOSIS — C9000 Multiple myeloma not having achieved remission: Secondary | ICD-10-CM

## 2023-03-25 DIAGNOSIS — Z515 Encounter for palliative care: Secondary | ICD-10-CM

## 2023-03-25 DIAGNOSIS — R530 Neoplastic (malignant) related fatigue: Secondary | ICD-10-CM

## 2023-03-25 DIAGNOSIS — Z5112 Encounter for antineoplastic immunotherapy: Secondary | ICD-10-CM | POA: Diagnosis not present

## 2023-03-25 DIAGNOSIS — Z79899 Other long term (current) drug therapy: Secondary | ICD-10-CM | POA: Diagnosis not present

## 2023-03-25 DIAGNOSIS — Z79620 Long term (current) use of immunosuppressive biologic: Secondary | ICD-10-CM | POA: Diagnosis not present

## 2023-03-25 LAB — CBC WITH DIFFERENTIAL (CANCER CENTER ONLY)
Abs Immature Granulocytes: 0.03 10*3/uL (ref 0.00–0.07)
Basophils Absolute: 0 10*3/uL (ref 0.0–0.1)
Basophils Relative: 1 %
Eosinophils Absolute: 0 10*3/uL (ref 0.0–0.5)
Eosinophils Relative: 0 %
HCT: 43.7 % (ref 36.0–46.0)
Hemoglobin: 15 g/dL (ref 12.0–15.0)
Immature Granulocytes: 0 %
Lymphocytes Relative: 3 %
Lymphs Abs: 0.2 10*3/uL — ABNORMAL LOW (ref 0.7–4.0)
MCH: 34.3 pg — ABNORMAL HIGH (ref 26.0–34.0)
MCHC: 34.3 g/dL (ref 30.0–36.0)
MCV: 100 fL (ref 80.0–100.0)
Monocytes Absolute: 0.1 10*3/uL (ref 0.1–1.0)
Monocytes Relative: 1 %
Neutro Abs: 8.1 10*3/uL — ABNORMAL HIGH (ref 1.7–7.7)
Neutrophils Relative %: 95 %
Platelet Count: 235 10*3/uL (ref 150–400)
RBC: 4.37 MIL/uL (ref 3.87–5.11)
RDW: 12 % (ref 11.5–15.5)
WBC Count: 8.5 10*3/uL (ref 4.0–10.5)
nRBC: 0 % (ref 0.0–0.2)

## 2023-03-25 LAB — CMP (CANCER CENTER ONLY)
ALT: 8 U/L (ref 0–44)
AST: 11 U/L — ABNORMAL LOW (ref 15–41)
Albumin: 4.4 g/dL (ref 3.5–5.0)
Alkaline Phosphatase: 89 U/L (ref 38–126)
Anion gap: 8 (ref 5–15)
BUN: 11 mg/dL (ref 8–23)
CO2: 26 mmol/L (ref 22–32)
Calcium: 9.2 mg/dL (ref 8.9–10.3)
Chloride: 105 mmol/L (ref 98–111)
Creatinine: 0.64 mg/dL (ref 0.44–1.00)
GFR, Estimated: >60 mL/min (ref >60)
Glucose, Bld: 157 mg/dL — ABNORMAL HIGH (ref 70–99)
Potassium: 3.7 mmol/L (ref 3.5–5.1)
Sodium: 139 mmol/L (ref 135–145)
Total Bilirubin: 0.5 mg/dL (ref 0.3–1.2)
Total Protein: 6.6 g/dL (ref 6.5–8.1)

## 2023-03-25 MED ORDER — MONTELUKAST SODIUM 10 MG PO TABS
10.0000 mg | ORAL_TABLET | Freq: Every day | ORAL | Status: DC
  Administered 2023-03-25: 10 mg via ORAL
  Filled 2023-03-25: qty 1

## 2023-03-25 MED ORDER — DIPHENHYDRAMINE HCL 25 MG PO CAPS
50.0000 mg | ORAL_CAPSULE | Freq: Once | ORAL | Status: AC
  Administered 2023-03-25: 25 mg via ORAL
  Filled 2023-03-25: qty 2

## 2023-03-25 MED ORDER — DEXAMETHASONE 4 MG PO TABS: 20.0000 mg | ORAL_TABLET | Freq: Once | ORAL | Status: DC

## 2023-03-25 MED ORDER — ACETAMINOPHEN 325 MG PO TABS
650.0000 mg | ORAL_TABLET | Freq: Once | ORAL | Status: AC
  Administered 2023-03-25: 650 mg via ORAL
  Filled 2023-03-25: qty 2

## 2023-03-25 MED ORDER — DARATUMUMAB-HYALURONIDASE-FIHJ 1800-30000 MG-UT/15ML ~~LOC~~ SOLN
1800.0000 mg | Freq: Once | SUBCUTANEOUS | Status: AC
  Administered 2023-03-25: 1800 mg via SUBCUTANEOUS
  Filled 2023-03-25: qty 15

## 2023-03-25 NOTE — Progress Notes (Signed)
Palliative Medicine [facility]  Telephone:([phone] [phone]   Name: Alice Anthony Date: 03/25/2023 MRN: [id number]  DOB: 23-May-1941  Patient Care Team: [name], MD as PCP - General (Family Medicine) Pickenpack-Cousar, [name], NP as Nurse Practitioner (Nurse Practitioner)    INTERVAL HISTORY: Alice Anthony is a 82 y.o. female with oncologic medical history including recent diagnosis of multiple myeloma (12/2022), DM, and posterior vitreous detachment of left and right eyes. Palliative ask to see for symptom management and goals of care   SOCIAL HISTORY:     reports that she has never smoked. She has never used smokeless tobacco. She reports that she does not drink alcohol and does not use drugs.  ADVANCE DIRECTIVES:  Advanced directives on file  CODE STATUS: Full code  PAST MEDICAL HISTORY: Past Medical History:  Diagnosis Date   Anxiety    Arthritis    Asthma    Hx: of   Cancer (HCC)    Hx: of skin cancer   Diverticulosis    GERD (gastroesophageal reflux disease)    Hx: of   History of kidney stones    Hyperlipemia    Hypertension    Pre-diabetes     ALLERGIES:  is allergic to hydrocodone, vioxx [rofecoxib], codeine, doxycycline hyclate, and latex.  MEDICATIONS:  Current Outpatient Medications  Medication Sig Dispense Refill   acetaminophen (TYLENOL) 500 MG tablet Take 1,000 mg by mouth every 6 (six) hours as needed for moderate pain. (Patient not taking: Reported on 02/12/2023)     acyclovir (ZOVIRAX) 400 MG tablet Take 1 tablet (400 mg total) by mouth 2 (two) times daily. 180 tablet 1   ALPRAZolam (XANAX) 0.25 MG tablet Take 0.25 mg by mouth 3 (three) times daily as needed.     amLODipine (NORVASC) 5 MG tablet Take 5 mg by mouth daily.     aspirin 81 MG tablet Take 81 mg by mouth daily.     bacitracin-neomycin-polymyxin-hydrocortisone (CORTISPORIN) 1 % ophthalmic ointment Place 1 Application into the right eye 2 (two)  times daily. (Patient taking differently: Place 1 Application into the right eye 2 (two) times daily. Direction on bottle are three times a day for 7 days) 3.5 g 0   cetirizine (ZYRTEC) 10 MG tablet Take 10 mg by mouth at bedtime.     Cholecalciferol (VITAMIN D3) 2000 UNITS capsule Take 2,000 Units by mouth daily.     dexamethasone (DECADRON) 4 MG tablet Take 40 mg (10 tablets) by mouth once a week. 40 tablet 3   dextromethorphan-guaiFENesin (TUSSIN DM) 10-100 MG/5ML liquid Take 10 mLs by mouth every 4 (four) hours as needed for cough.     fluticasone (FLONASE) 50 MCG/ACT nasal spray Place 2 sprays into the nose at bedtime.     glimepiride (AMARYL) 2 MG tablet Take 1-2 mg by mouth daily as needed (high blood sugar).     hydrOXYzine (ATARAX) 25 MG tablet Take 25 mg by mouth at bedtime.     metoCLOPramide (REGLAN) 10 MG tablet Take 1 tablet (10 mg total) by mouth every 6 (six) hours. (Patient not taking: Reported on 02/26/2023) 60 tablet 0   Probiotic Product (ALIGN PO) Take by mouth daily.     prochlorperazine (COMPAZINE) 10 MG tablet Take 1 tablet (10 mg total) by mouth every 6 (six) hours as needed for nausea or vomiting. (Patient not taking: Reported on 02/12/2023) 30 tablet 0   simvastatin (ZOCOR) 20 MG tablet Take 20 mg by  mouth every evening.     traMADol (ULTRAM) 50 MG tablet TAKE 1 TO 2 TABLETS BY MOUTH EVERY 4 HOURS AS NEEDED (Patient taking differently: Take 50-100 mg by mouth every 4 (four) hours as needed. Currently taking 2 tablets PO daily) 90 tablet 0   No current facility-administered medications for this visit.    VITAL SIGNS: There were no vitals taken for this visit. There were no vitals filed for this visit.  Estimated body mass index is 22.82 kg/m as calculated from the following:   Height as of 02/26/23: 5\' 1"  (1.549 m).   Weight as of 03/18/23: 120 lb 12 oz (54.8 kg).   PERFORMANCE STATUS (ECOG) : 2 - Symptomatic, <50% confined to bed   Physical Exam General: NAD,  sitting in recliner  Cardiovascular: regular rate and rhythm Pulmonary: normal breathing pattern  Extremities: no edema, no joint deformities Skin: no rashes Neurological: AAO x4  IMPRESSION: I saw Alice Anthony during her infusion. No acute distress. Tolerating treatment without difficulty. Denies nausea, vomiting, constipation, or diarrhea. Doing well overall.   Neoplasm related pain Alice Anthony reports pain is well controlled on current regimen. She is tolerating Tramadol 50-100mg .    We discussed continuing current regimen without changes at this time.   Will continue close follow-up.    Nausea  Controlled   3. Constipation Is better controlled with regimen. Challenging at times.   4. Goals of Care  5/20- We discussed her current illness and what it means in the larger context of her on-going co-morbidities. Natural disease trajectory and expectations were discussed.   Patient and daughter are realistic in their understanding of current illness. Is remaining hopeful for stability and improve quality of life.   We discussed Her current illness and what it means in the larger context of Her on-going co-morbidities. Natural disease trajectory and expectations were discussed.  I discussed the importance of continued conversation with family and their medical providers regarding overall plan of care and treatment options, ensuring decisions are within the context of the patients values and GOCs.  PLAN:  Tramadol 50-100 mg every 4-6 hours as needed Senna-S 2 pills twice daily Compazine as needed Reglan every 6 hours Ongoing support and symptom management I will plan to see patient back in 3-4 weeks in collaboration to other oncology appointments.    Patient expressed understanding and was in agreement with this plan. She also understands that She can call the clinic at any time with any questions, concerns, or complaints.   Any controlled substances utilized were prescribed  in the context of palliative care. PDMP has been reviewed.    Visit consisted of counseling and education dealing with the complex and emotionally intense issues of symptom management and palliative care in the setting of serious and potentially life-threatening illness.Greater than 50%  of this time was spent counseling and coordinating care related to the above assessment and plan.  Willette Alma, AGPCNP-BC  Palliative Medicine Team/[facility]  *Please note that this is a verbal dictation therefore any spelling or grammatical errors are due to the "Dragon [employer] One" system interpretation.

## 2023-03-25 NOTE — Progress Notes (Signed)
[facility] [phone]   Fax:(336) [phone]  PROGRESS NOTE  Patient Care Team: [name], MD as PCP - General (Family Medicine) Pickenpack-Cousar, [name], NP as Nurse Practitioner (Nurse Practitioner)  Hematological/Oncological History # Plasmacytoma of Thoracic Spine  # IgG Kappa Multiple myeloma 09/22/2022: Patient underwent MRI thoracic spine which showed acute or subacute T7, T10, and T11 compression fractures 10/10/2022: biopsy of compression fracture of T11 reveals a plasmacytoma 11/13/2022: establish care with Dr. Leonides Schanz  12/06/2022: bone marrow biopsy showed plasma cell myeloma involving 80% of the marrow.  12/24/2022: Cycle 1 Day 1 of Velcade/Dex 01/14/2023: Cycle 2 Day 1 of Velcade/Dex 02/04/2023: Cycle 3 Day 1 of Velcade/Dex 02/26/2023: Cycle 4 Day 1 of Velcade/Dex HELD due to ocular toxicity 03/04/2023: Cycle 1 Day 1 of Dara/Dex   Interval History:  [name] 82 y.o. female with medical history significant for newly diagnosed IgG kappa multiple myeloma who presents for a follow up visit. The patient's last visit was on 03/08/2023.  In the interim since the last visit she has continued on Velcade therapy.   On exam today Ms. Lanes reports she has been well overall in the interim since our last visit.  She reports that she thinks the Darzalex chemotherapy is "a little more rough than the last one".  She notes that she has had increases in her blood sugars and also thinks that receiving the 4 antihistamines has been a bit much.  She notes that she would like to decrease her Benadryl dose.  She notes that her styes are still there but improving greatly.  She notes that she can sometimes wake up with "green goop" in her eye.  She notes he is eating well with a fair appetite.  Otherwise she is having no symptoms.  She is willing and able to continue with chemotherapy at this time. She denies any fevers, chills, sweats, shortness of breath, chest pain or  cough.  A full 10 point ROS is otherwise negative.   MEDICAL HISTORY:  Past Medical History:  Diagnosis Date   Anxiety    Arthritis    Asthma    Hx: of   Cancer (HCC)    Hx: of skin cancer   Diverticulosis    GERD (gastroesophageal reflux disease)    Hx: of   History of kidney stones    Hyperlipemia    Hypertension    Pre-diabetes     SURGICAL HISTORY: Past Surgical History:  Procedure Laterality Date   ABDOMINAL SURGERY  09/19/2012   Ruptured Diverticuli, Ostomy Placed.   BREAST BIOPSY  06/06/2012   breast biopsy   CATARACT EXTRACTION W/ INTRAOCULAR LENS IMPLANT Bilateral    COLONOSCOPY  03/06/2012   COLOSTOMY CLOSURE  12/25/2012   Dr Luisa Hart   COLOSTOMY CLOSURE N/A 12/25/2012   Procedure: COLOSTOMY CLOSURE;  Surgeon: Clovis Pu. Cornett, MD;  Location: [facility] OR;  Service: General;  Laterality: N/A;   EXTRACORPOREAL SHOCK WAVE LITHOTRIPSY Left 11/17/2018   Procedure: EXTRACORPOREAL SHOCK WAVE LITHOTRIPSY (ESWL);  Surgeon: Malen Gauze, MD;  Location: [facility] ORS;  Service: Urology;  Laterality: Left;   EYE SURGERY  10/19/1998   Hx: of cyst removed from left eye   KYPHOPLASTY N/A 10/10/2022   Procedure: KYPHOPLASTY Thoracic seven , Thoracic ten,Thoracic eleven;  Surgeon: Tressie Stalker, MD;  Location: [facility] OR;  Service: Neurosurgery;  Laterality: N/A;   LAPAROTOMY N/A 09/19/2012   Procedure: EXPLORATORY LAPAROTOMY, sigmoid colectomy;  Surgeon: Clovis Pu. Cornett, MD;  Location: [facility] ORS;  Service: General;  Laterality: N/A;   SIGMOIDOSCOPY  01/11/1999   TONSILLECTOMY     TRIGGER FINGER RELEASE  12/25/2010   TUBAL LIGATION  10/16/1985    SOCIAL HISTORY: Social History   Socioeconomic History   Marital status: Widowed    Spouse name: Not on file   Number of children: Not on file   Years of education: Not on file   Highest education level: Not on file  Occupational History   Not on file  Tobacco Use   Smoking status: Never   Smokeless tobacco: Never  Vaping Use    Vaping status: Never Used  Substance and Sexual Activity   Alcohol use: No   Drug use: No   Sexual activity: Not Currently  Other Topics Concern   Not on file  Social History Narrative   Not on file   Social Determinants of Health   Financial Resource Strain: Not on file  Food Insecurity: Not on file  Transportation Needs: Not on file  Physical Activity: Not on file  Stress: Not on file  Social Connections: Not on file  Intimate Partner Violence: Not on file    FAMILY HISTORY: Family History  Problem Relation Age of Onset   Diabetes Father    Alzheimer's disease Father    Basal cell carcinoma Mother        Metastatic    ALLERGIES:  is allergic to hydrocodone, vioxx [rofecoxib], codeine, doxycycline hyclate, and latex.  MEDICATIONS:  Current Outpatient Medications  Medication Sig Dispense Refill   acetaminophen (TYLENOL) 500 MG tablet Take 1,000 mg by mouth every 6 (six) hours as needed for moderate pain.     bacitracin-neomycin-polymyxin-hydrocortisone (CORTISPORIN) 1 % ophthalmic ointment Place 1 Application into the right eye 2 (two) times daily. (Patient taking differently: Place 1 Application into the right eye 2 (two) times daily. Direction on bottle are three times a day for 7 days) 3.5 g 0   metoCLOPramide (REGLAN) 10 MG tablet Take 1 tablet (10 mg total) by mouth every 6 (six) hours. 60 tablet 0   prochlorperazine (COMPAZINE) 10 MG tablet Take 1 tablet (10 mg total) by mouth every 6 (six) hours as needed for nausea or vomiting. 30 tablet 0   acyclovir (ZOVIRAX) 400 MG tablet Take 1 tablet (400 mg total) by mouth 2 (two) times daily. 180 tablet 1   ALPRAZolam (XANAX) 0.25 MG tablet Take 0.25 mg by mouth 3 (three) times daily as needed.     amLODipine (NORVASC) 5 MG tablet Take 5 mg by mouth daily.     aspirin 81 MG tablet Take 81 mg by mouth daily.     cetirizine (ZYRTEC) 10 MG tablet Take 10 mg by mouth at bedtime.     Cholecalciferol (VITAMIN D3) 2000 UNITS  capsule Take 2,000 Units by mouth daily.     dexamethasone (DECADRON) 4 MG tablet Take 40 mg (10 tablets) by mouth once a week. 40 tablet 3   dextromethorphan-guaiFENesin (TUSSIN DM) 10-100 MG/5ML liquid Take 10 mLs by mouth every 4 (four) hours as needed for cough.     fluticasone (FLONASE) 50 MCG/ACT nasal spray Place 2 sprays into the nose at bedtime.     hydrOXYzine (ATARAX) 25 MG tablet Take 25 mg by mouth at bedtime.     Probiotic Product (ALIGN PO) Take by mouth daily.     simvastatin (ZOCOR) 20 MG tablet Take 20 mg by mouth every evening.     traMADol (ULTRAM) 50 MG tablet TAKE 1 TO  2 TABLETS BY MOUTH EVERY 4 HOURS AS NEEDED (Patient taking differently: Take 50-100 mg by mouth every 4 (four) hours as needed. Currently taking 2 tablets PO daily) 90 tablet 0   No current facility-administered medications for this visit.    REVIEW OF SYSTEMS:   Constitutional: ( - ) fevers, ( - )  chills , ( - ) night sweats Eyes: ( - ) blurriness of vision, ( - ) double vision, ( - ) watery eyes Ears, nose, mouth, throat, and face: ( - ) mucositis, ( - ) sore throat Respiratory: ( - ) cough, ( - ) dyspnea, ( - ) wheezes Cardiovascular: ( - ) palpitation, ( - ) chest discomfort, ( - ) lower extremity swelling Gastrointestinal:  ( - ) nausea, ( - ) heartburn, ( - ) change in bowel habits Skin: ( - ) abnormal skin rashes Lymphatics: ( - ) new lymphadenopathy, ( - ) easy bruising Neurological: ( - ) numbness, ( - ) tingling, ( - ) new weaknesses Behavioral/Psych: ( - ) mood change, ( - ) new changes  All other systems were reviewed with the patient and are negative.  PHYSICAL EXAMINATION: ECOG PERFORMANCE STATUS: 1 - Symptomatic but completely ambulatory  Vitals:   03/25/23 1145  BP: 137/71  Pulse: 89  Resp: 17  Temp: 97.9 F (36.6 C)  SpO2: 98%      Filed Weights   03/25/23 1145  Weight: 121 lb 1.6 oz (54.9 kg)      GENERAL: Well-appearing elderly Caucasian female, alert, no distress  and comfortable SKIN: skin color, texture, turgor are normal, no rashes or significant lesions EYES: conjunctiva are pink and non-injected, sclera clear. Stye on lower eyelid of right eye that is erythematous. No discharge noted.  LUNGS: clear to auscultation and percussion with normal breathing effort HEART: regular rate & rhythm and no murmurs and no lower extremity edema Musculoskeletal: no cyanosis of digits and no clubbing  PSYCH: alert & oriented x 3, fluent speech NEURO: no focal motor/sensory deficits  LABORATORY DATA:  I have reviewed the data as listed    Latest Ref Rng & Units 03/25/2023   11:17 AM 03/18/2023    1:57 PM 03/08/2023    2:12 PM  CBC  WBC 4.0 - 10.5 K/uL 8.5  6.2  11.1   Hemoglobin 12.0 - 15.0 g/dL 16.1  09.6  04.5   Hematocrit 36.0 - 46.0 % 43.7  40.6  39.7   Platelets 150 - 400 K/uL 235  220  254        Latest Ref Rng & Units 03/25/2023   11:17 AM 03/18/2023    1:57 PM 03/08/2023    2:12 PM  CMP  Glucose 70 - 99 mg/dL 409  811  914   BUN 8 - 23 mg/dL 11  17  11    Creatinine 0.44 - 1.00 mg/dL 7.82  9.56  2.13   Sodium 135 - 145 mmol/L 139  138  138   Potassium 3.5 - 5.1 mmol/L 3.7  3.9  4.4   Chloride 98 - 111 mmol/L 105  106  104   CO2 22 - 32 mmol/L 26  23  25    Calcium 8.9 - 10.3 mg/dL 9.2  8.8  8.7   Total Protein 6.5 - 8.1 g/dL 6.6  6.4  6.4   Total Bilirubin 0.3 - 1.2 mg/dL 0.5  0.6  0.6   Alkaline Phos 38 - 126 U/L 89  100  107  AST 15 - 41 U/L 11  10  19    ALT 0 - 44 U/L 8  7  9      Lab Results  Component Value Date   MPROTEIN Not Observed 02/26/2023   MPROTEIN Not Observed 01/28/2023   MPROTEIN 0.1 (H) 11/14/2022   Lab Results  Component Value Date   KPAFRELGTCHN 235.6 (H) 02/26/2023   KPAFRELGTCHN 714.1 (H) 01/28/2023   KPAFRELGTCHN 4,793.0 (H) 11/14/2022   LAMBDASER 3.3 (L) 02/26/2023   LAMBDASER 2.9 (L) 01/28/2023   LAMBDASER 2.6 (L) 11/14/2022   KAPLAMBRATIO 71.39 (H) 02/26/2023   KAPLAMBRATIO 246.24 (H) 01/28/2023    KAPLAMBRATIO 735.45 (H) 11/29/2022    RADIOGRAPHIC STUDIES: No results found.  ASSESSMENT & PLAN Alice Anthony 82 y.o. female with medical history significant for IgG kappa multiple myeloma who presents for a follow up visit.   # IgG Kappa Multiple Myeloma # Plasmacytoma of Spine  -- Patient diagnosed with plasmacytoma of the spine, subsequent bone marrow biopsy confirmed an IgG kappa multiple myeloma involving the bone marrow --start of Velcade/Dex Cycle 1 Day 1 on 12/24/2022. Plan: -- Labs today show creatinine 0.64, white blood cell 8.5, hemoglobin 15.0, MCV 100, and platelets of 235 --Due to ocular toxicity, recommend to discontinue Velcade therapy and switch to Daratumumab.  Patient is still recovering from styes. --Can consider the addition of Revlimid to the patient's treatment regimen at a later time. -- Return to clinic in 2 weeks with continued Dara/Dex.   #Ocular toxicity/Stye --Secondary to Velcade therapy --Currently on Moxifloxacin drops with improvement --Sent azithromycin x 5 days.   #Supportive Care -- chemotherapy education complete -- port placement not required   -- zofran 8mg  q8H PRN and compazine 10mg  PO q6H for nausea -- acyclovir 400mg  PO BID for VCZ prophylaxis -- Recieved dental clearance prior to the start of Xgeva/Zometa. First dose on 01/07/2023.  Next due today.  -- Tramadol 50 mg every 6 hours as needed for pain control   Orders Placed This Encounter  Procedures   Multiple Myeloma Panel (SPEP&IFE w/QIG)    Standing Status:   Future    Standing Expiration Date:   03/31/2024   Kappa/lambda light chains    Standing Status:   Future    Standing Expiration Date:   03/31/2024   CMP (Cancer Center only)    Standing Status:   Future    Standing Expiration Date:   03/31/2024   CBC with Differential (Cancer Center Only)    Standing Status:   Future    Standing Expiration Date:   03/31/2024   CMP (Cancer Center only)    Standing Status:   Future     Standing Expiration Date:   04/07/2024   CBC with Differential (Cancer Center Only)    Standing Status:   Future    Standing Expiration Date:   04/07/2024   CMP (Cancer Center only)    Standing Status:   Future    Standing Expiration Date:   04/14/2024   CBC with Differential (Cancer Center Only)    Standing Status:   Future    Standing Expiration Date:   04/14/2024   CMP (Cancer Center only)    Standing Status:   Future    Standing Expiration Date:   04/21/2024   CBC with Differential (Cancer Center Only)    Standing Status:   Future    Standing Expiration Date:   04/21/2024   Multiple Myeloma Panel (SPEP&IFE w/QIG)    Standing Status:  Future    Standing Expiration Date:   04/28/2024   Kappa/lambda light chains    Standing Status:   Future    Standing Expiration Date:   04/28/2024   CMP (Cancer Center only)    Standing Status:   Future    Standing Expiration Date:   04/28/2024   CBC with Differential (Cancer Center Only)    Standing Status:   Future    Standing Expiration Date:   04/28/2024   CMP (Cancer Center only)    Standing Status:   Future    Standing Expiration Date:   05/12/2024   CBC with Differential (Cancer Center Only)    Standing Status:   Future    Standing Expiration Date:   05/12/2024   Multiple Myeloma Panel (SPEP&IFE w/QIG)    Standing Status:   Future    Standing Expiration Date:   05/26/2024   Kappa/lambda light chains    Standing Status:   Future    Standing Expiration Date:   05/26/2024   CMP (Cancer Center only)    Standing Status:   Future    Standing Expiration Date:   05/26/2024   CBC with Differential (Cancer Center Only)    Standing Status:   Future    Standing Expiration Date:   05/26/2024   CMP (Cancer Center only)    Standing Status:   Future    Standing Expiration Date:   06/09/2024   CBC with Differential (Cancer Center Only)    Standing Status:   Future    Standing Expiration Date:   06/09/2024    All questions were answered. The patient knows  to call the clinic with any problems, questions or concerns.  I have spent a total of 30 minutes minutes of face-to-face and non-face-to-face time, preparing to see the patient, performing a medically appropriate examination, counseling and educating the patient,  documenting clinical information in the electronic health record,and care coordination.   Ulysees Barns, MD Department of Hematology/Oncology [facility] Phone: [phone] Pager: [phone] Email: [email or web address]   03/25/2023 3:58 PM

## 2023-03-25 NOTE — Telephone Encounter (Signed)
Faxed hgb a-1C and CBC/diff and CMP.

## 2023-03-25 NOTE — Progress Notes (Signed)
Pt. states she took Decadron 40 mg orally at 0715 this am

## 2023-03-25 NOTE — Patient Instructions (Addendum)
[place] [facility]  Discharge Instructions: Thank you for choosing [facility] to provide your oncology and hematology care.   If you have a lab appointment with the Cancer Center, please go directly to the Cancer Center and check in at the registration area.   Wear comfortable clothing and clothing appropriate for easy access to any Portacath or PICC line.   We strive to give you quality time with your provider. You may need to reschedule your appointment if you arrive late (15 or more minutes).  Arriving late affects you and other patients whose appointments are after yours.  Also, if you miss three or more appointments without notifying the office, you may be dismissed from the clinic at the provider's discretion.      For prescription refill requests, have your pharmacy contact our office and allow 72 hours for refills to be completed.    Today you received the following chemotherapy and/or immunotherapy agent: Daratumumab Hyaluronidase (Darzalex Faspro)    To help prevent nausea and vomiting after your treatment, we encourage you to take your nausea medication as directed.  BELOW ARE SYMPTOMS THAT SHOULD BE REPORTED IMMEDIATELY: *FEVER GREATER THAN 100.4 F (38 C) OR HIGHER *CHILLS OR SWEATING *NAUSEA AND VOMITING THAT IS NOT CONTROLLED WITH YOUR NAUSEA MEDICATION *UNUSUAL SHORTNESS OF BREATH *UNUSUAL BRUISING OR BLEEDING *URINARY PROBLEMS (pain or burning when urinating, or frequent urination) *BOWEL PROBLEMS (unusual diarrhea, constipation, pain near the anus) TENDERNESS IN MOUTH AND THROAT WITH OR WITHOUT PRESENCE OF ULCERS (sore throat, sores in mouth, or a toothache) UNUSUAL RASH, SWELLING OR PAIN  UNUSUAL VAGINAL DISCHARGE OR ITCHING   Items with * indicate a potential emergency and should be followed up as soon as possible or go to the Emergency Department if any problems should occur.  Please show the CHEMOTHERAPY ALERT CARD or  IMMUNOTHERAPY ALERT CARD at check-in to the Emergency Department and triage nurse.  Should you have questions after your visit or need to cancel or reschedule your appointment, please contact [place] [facility]  Dept: [phone]  and follow the prompts.  Office hours are 8:00 a.m. to 4:30 p.m. Monday - Friday. Please note that voicemails left after 4:00 p.m. may not be returned until the following business day.  We are closed weekends and major holidays. You have access to a nurse at all times for urgent questions. Please call the main number to the clinic Dept: [phone] and follow the prompts.   For any non-urgent questions, you may also contact your provider using MyChart. We now offer e-Visits for anyone 32 and older to request care online for non-urgent symptoms. For details visit [email or web address].   Also download the MyChart app! Go to the app store, search "MyChart", open the app, select [place], and log in with your MyChart username and password.  Daratumumab; Hyaluronidase Injection What is this medication? DARATUMUMAB; HYALURONIDASE (dar a toom ue mab; hye al ur ON i dase) treats multiple myeloma, a type of bone marrow cancer. Daratumumab works by blocking a protein that causes cancer cells to grow and multiply. This helps to slow or stop the spread of cancer cells. Hyaluronidase works by increasing the absorption of other medications in the body to help them work better. This medication may also be used treat amyloidosis, a condition that causes the buildup of a protein (amyloid) in your body. It works by reducing the buildup of this protein, which decreases symptoms. It  is a combination medication that contains a monoclonal antibody. This medicine may be used for other purposes; ask your health care provider or pharmacist if you have questions. COMMON BRAND NAME(S): DARZALEX FASPRO What should I tell my care team before I take this  medication? They need to know if you have any of these conditions: Heart disease Infection, such as chickenpox, cold sores, herpes, hepatitis B Lung or breathing disease An unusual or allergic reaction to daratumumab, hyaluronidase, other medications, foods, dyes, or preservatives Pregnant or trying to get pregnant Breast-feeding How should I use this medication? This medication is injected under the skin. It is given by your care team in a hospital or clinic setting. Talk to your care team about the use of this medication in children. Special care may be needed. Overdosage: If you think you have taken too much of this medicine contact a poison control center or emergency room at once. NOTE: This medicine is only for you. Do not share this medicine with others. What if I miss a dose? Keep appointments for follow-up doses. It is important not to miss your dose. Call your care team if you are unable to keep an appointment. What may interact with this medication? Interactions have not been studied. This list may not describe all possible interactions. Give your health care provider a list of all the medicines, herbs, non-prescription drugs, or dietary supplements you use. Also tell them if you smoke, drink alcohol, or use illegal drugs. Some items may interact with your medicine. What should I watch for while using this medication? Your condition will be monitored carefully while you are receiving this medication. This medication can cause serious allergic reactions. To reduce your risk, your care team may give you other medication to take before receiving this one. Be sure to follow the directions from your care team. This medication can affect the results of blood tests to match your blood type. These changes can last for up to 6 months after the final dose. Your care team will do blood tests to match your blood type before you start treatment. Tell all of your care team that you are being  treated with this medication before receiving a blood transfusion. This medication can affect the results of some tests used to determine treatment response; extra tests may be needed to evaluate response. Talk to your care team if you wish to become pregnant or think you are pregnant. This medication can cause serious birth defects if taken during pregnancy and for 3 months after the last dose. A reliable form of contraception is recommended while taking this medication and for 3 months after the last dose. Talk to your care team about effective forms of contraception. Do not breast-feed while taking this medication. What side effects may I notice from receiving this medication? Side effects that you should report to your care team as soon as possible: Allergic reactions--skin rash, itching, hives, swelling of the face, lips, tongue, or throat Heart rhythm changes--fast or irregular heartbeat, dizziness, feeling faint or lightheaded, chest pain, trouble breathing Infection--fever, chills, cough, sore throat, wounds that don't heal, pain or trouble when passing urine, general feeling of discomfort or being unwell Infusion reactions--chest pain, shortness of breath or trouble breathing, feeling faint or lightheaded Sudden eye pain or change in vision such as blurry vision, seeing halos around lights, vision loss Unusual bruising or bleeding Side effects that usually do not require medical attention (report to your care team if they continue or are  bothersome): Constipation Diarrhea Fatigue Nausea Pain, tingling, or numbness in the hands or feet Swelling of the ankles, hands, or feet This list may not describe all possible side effects. Call your doctor for medical advice about side effects. You may report side effects to FDA at 1-800-FDA-1088. Where should I keep my medication? This medication is given in a hospital or clinic. It will not be stored at home. NOTE: This sheet is a summary. It may  not cover all possible information. If you have questions about this medicine, talk to your doctor, pharmacist, or health care provider.  2024 Elsevier/Gold Standard (2021-11-28 00:00:00)

## 2023-04-01 ENCOUNTER — Other Ambulatory Visit: Payer: Self-pay | Admitting: Hematology and Oncology

## 2023-04-01 ENCOUNTER — Inpatient Hospital Stay: Payer: Medicare Other

## 2023-04-01 ENCOUNTER — Other Ambulatory Visit: Payer: Medicare Other

## 2023-04-01 ENCOUNTER — Ambulatory Visit: Payer: Medicare Other

## 2023-04-01 VITALS — BP 118/62 | HR 90 | Temp 98.4°F | Resp 16 | Wt 121.2 lb

## 2023-04-01 DIAGNOSIS — C9000 Multiple myeloma not having achieved remission: Secondary | ICD-10-CM | POA: Diagnosis not present

## 2023-04-01 DIAGNOSIS — Z5112 Encounter for antineoplastic immunotherapy: Secondary | ICD-10-CM | POA: Diagnosis not present

## 2023-04-01 DIAGNOSIS — Z79899 Other long term (current) drug therapy: Secondary | ICD-10-CM | POA: Diagnosis not present

## 2023-04-01 DIAGNOSIS — Z79620 Long term (current) use of immunosuppressive biologic: Secondary | ICD-10-CM | POA: Diagnosis not present

## 2023-04-01 LAB — CBC WITH DIFFERENTIAL (CANCER CENTER ONLY)
Abs Immature Granulocytes: 0.01 10*3/uL (ref 0.00–0.07)
Basophils Absolute: 0 10*3/uL (ref 0.0–0.1)
Basophils Relative: 1 %
Eosinophils Absolute: 0 10*3/uL (ref 0.0–0.5)
Eosinophils Relative: 0 %
HCT: 42.2 % (ref 36.0–46.0)
Hemoglobin: 14.4 g/dL (ref 12.0–15.0)
Immature Granulocytes: 0 %
Lymphocytes Relative: 3 %
Lymphs Abs: 0.2 10*3/uL — ABNORMAL LOW (ref 0.7–4.0)
MCH: 34 pg (ref 26.0–34.0)
MCHC: 34.1 g/dL (ref 30.0–36.0)
MCV: 99.5 fL (ref 80.0–100.0)
Monocytes Absolute: 0 10*3/uL — ABNORMAL LOW (ref 0.1–1.0)
Monocytes Relative: 0 %
Neutro Abs: 5.5 10*3/uL (ref 1.7–7.7)
Neutrophils Relative %: 96 %
Platelet Count: 183 10*3/uL (ref 150–400)
RBC: 4.24 MIL/uL (ref 3.87–5.11)
RDW: 12.1 % (ref 11.5–15.5)
WBC Count: 5.7 10*3/uL (ref 4.0–10.5)
nRBC: 0 % (ref 0.0–0.2)

## 2023-04-01 LAB — CMP (CANCER CENTER ONLY)
ALT: 9 U/L (ref 0–44)
AST: 12 U/L — ABNORMAL LOW (ref 15–41)
Albumin: 4.3 g/dL (ref 3.5–5.0)
Alkaline Phosphatase: 86 U/L (ref 38–126)
Anion gap: 9 (ref 5–15)
BUN: 12 mg/dL (ref 8–23)
CO2: 24 mmol/L (ref 22–32)
Calcium: 8.7 mg/dL — ABNORMAL LOW (ref 8.9–10.3)
Chloride: 104 mmol/L (ref 98–111)
Creatinine: 0.71 mg/dL (ref 0.44–1.00)
GFR, Estimated: >60 mL/min (ref >60)
Glucose, Bld: 290 mg/dL — ABNORMAL HIGH (ref 70–99)
Potassium: 3.8 mmol/L (ref 3.5–5.1)
Sodium: 137 mmol/L (ref 135–145)
Total Bilirubin: 0.6 mg/dL (ref 0.3–1.2)
Total Protein: 6.2 g/dL — ABNORMAL LOW (ref 6.5–8.1)

## 2023-04-01 MED ORDER — DARATUMUMAB-HYALURONIDASE-FIHJ 1800-30000 MG-UT/15ML ~~LOC~~ SOLN
1800.0000 mg | Freq: Once | SUBCUTANEOUS | Status: AC
  Administered 2023-04-01: 1800 mg via SUBCUTANEOUS
  Filled 2023-04-01: qty 15

## 2023-04-01 MED ORDER — ACETAMINOPHEN 325 MG PO TABS
650.0000 mg | ORAL_TABLET | Freq: Once | ORAL | Status: AC
  Administered 2023-04-01: 650 mg via ORAL
  Filled 2023-04-01: qty 2

## 2023-04-01 MED ORDER — MONTELUKAST SODIUM 10 MG PO TABS
10.0000 mg | ORAL_TABLET | Freq: Once | ORAL | Status: AC
  Administered 2023-04-01: 10 mg via ORAL
  Filled 2023-04-01: qty 1

## 2023-04-01 MED ORDER — DEXAMETHASONE 4 MG PO TABS: 20.0000 mg | ORAL_TABLET | Freq: Once | ORAL | Status: DC

## 2023-04-01 MED ORDER — DIPHENHYDRAMINE HCL 25 MG PO CAPS
50.0000 mg | ORAL_CAPSULE | Freq: Once | ORAL | Status: AC
  Administered 2023-04-01: 25 mg via ORAL
  Filled 2023-04-01: qty 2

## 2023-04-01 NOTE — Progress Notes (Signed)
Pt. states she took Decadron 40 mg po this am.

## 2023-04-01 NOTE — Patient Instructions (Signed)
[place] [facility]  Discharge Instructions: Thank you for choosing [facility] to provide your oncology and hematology care.   If you have a lab appointment with the Cancer Center, please go directly to the Cancer Center and check in at the registration area.   Wear comfortable clothing and clothing appropriate for easy access to any Portacath or PICC line.   We strive to give you quality time with your provider. You may need to reschedule your appointment if you arrive late (15 or more minutes).  Arriving late affects you and other patients whose appointments are after yours.  Also, if you miss three or more appointments without notifying the office, you may be dismissed from the clinic at the provider's discretion.      For prescription refill requests, have your pharmacy contact our office and allow 72 hours for refills to be completed.    Today you received the following chemotherapy and/or immunotherapy agent: Daratumumab (Darzalex Faspro)   To help prevent nausea and vomiting after your treatment, we encourage you to take your nausea medication as directed.  BELOW ARE SYMPTOMS THAT SHOULD BE REPORTED IMMEDIATELY: *FEVER GREATER THAN 100.4 F (38 C) OR HIGHER *CHILLS OR SWEATING *NAUSEA AND VOMITING THAT IS NOT CONTROLLED WITH YOUR NAUSEA MEDICATION *UNUSUAL SHORTNESS OF BREATH *UNUSUAL BRUISING OR BLEEDING *URINARY PROBLEMS (pain or burning when urinating, or frequent urination) *BOWEL PROBLEMS (unusual diarrhea, constipation, pain near the anus) TENDERNESS IN MOUTH AND THROAT WITH OR WITHOUT PRESENCE OF ULCERS (sore throat, sores in mouth, or a toothache) UNUSUAL RASH, SWELLING OR PAIN  UNUSUAL VAGINAL DISCHARGE OR ITCHING   Items with * indicate a potential emergency and should be followed up as soon as possible or go to the Emergency Department if any problems should occur.  Please show the CHEMOTHERAPY ALERT CARD or IMMUNOTHERAPY  ALERT CARD at check-in to the Emergency Department and triage nurse.  Should you have questions after your visit or need to cancel or reschedule your appointment, please contact [place] [facility]  Dept: [phone]  and follow the prompts.  Office hours are 8:00 a.m. to 4:30 p.m. Monday - Friday. Please note that voicemails left after 4:00 p.m. may not be returned until the following business day.  We are closed weekends and major holidays. You have access to a nurse at all times for urgent questions. Please call the main number to the clinic Dept: [phone] and follow the prompts.   For any non-urgent questions, you may also contact your provider using MyChart. We now offer e-Visits for anyone 74 and older to request care online for non-urgent symptoms. For details visit [email or web address].   Also download the MyChart app! Go to the app store, search "MyChart", open the app, select [place], and log in with your MyChart username and password.  Daratumumab; Hyaluronidase Injection What is this medication? DARATUMUMAB; HYALURONIDASE (dar a toom ue mab; hye al ur ON i dase) treats multiple myeloma, a type of bone marrow cancer. Daratumumab works by blocking a protein that causes cancer cells to grow and multiply. This helps to slow or stop the spread of cancer cells. Hyaluronidase works by increasing the absorption of other medications in the body to help them work better. This medication may also be used treat amyloidosis, a condition that causes the buildup of a protein (amyloid) in your body. It works by reducing the buildup of this protein, which decreases symptoms. It is a  combination medication that contains a monoclonal antibody. This medicine may be used for other purposes; ask your health care provider or pharmacist if you have questions. COMMON BRAND NAME(S): DARZALEX FASPRO What should I tell my care team before I take this medication? They need to  know if you have any of these conditions: Heart disease Infection, such as chickenpox, cold sores, herpes, hepatitis B Lung or breathing disease An unusual or allergic reaction to daratumumab, hyaluronidase, other medications, foods, dyes, or preservatives Pregnant or trying to get pregnant Breast-feeding How should I use this medication? This medication is injected under the skin. It is given by your care team in a hospital or clinic setting. Talk to your care team about the use of this medication in children. Special care may be needed. Overdosage: If you think you have taken too much of this medicine contact a poison control center or emergency room at once. NOTE: This medicine is only for you. Do not share this medicine with others. What if I miss a dose? Keep appointments for follow-up doses. It is important not to miss your dose. Call your care team if you are unable to keep an appointment. What may interact with this medication? Interactions have not been studied. This list may not describe all possible interactions. Give your health care provider a list of all the medicines, herbs, non-prescription drugs, or dietary supplements you use. Also tell them if you smoke, drink alcohol, or use illegal drugs. Some items may interact with your medicine. What should I watch for while using this medication? Your condition will be monitored carefully while you are receiving this medication. This medication can cause serious allergic reactions. To reduce your risk, your care team may give you other medication to take before receiving this one. Be sure to follow the directions from your care team. This medication can affect the results of blood tests to match your blood type. These changes can last for up to 6 months after the final dose. Your care team will do blood tests to match your blood type before you start treatment. Tell all of your care team that you are being treated with this medication  before receiving a blood transfusion. This medication can affect the results of some tests used to determine treatment response; extra tests may be needed to evaluate response. Talk to your care team if you wish to become pregnant or think you are pregnant. This medication can cause serious birth defects if taken during pregnancy and for 3 months after the last dose. A reliable form of contraception is recommended while taking this medication and for 3 months after the last dose. Talk to your care team about effective forms of contraception. Do not breast-feed while taking this medication. What side effects may I notice from receiving this medication? Side effects that you should report to your care team as soon as possible: Allergic reactions--skin rash, itching, hives, swelling of the face, lips, tongue, or throat Heart rhythm changes--fast or irregular heartbeat, dizziness, feeling faint or lightheaded, chest pain, trouble breathing Infection--fever, chills, cough, sore throat, wounds that don't heal, pain or trouble when passing urine, general feeling of discomfort or being unwell Infusion reactions--chest pain, shortness of breath or trouble breathing, feeling faint or lightheaded Sudden eye pain or change in vision such as blurry vision, seeing halos around lights, vision loss Unusual bruising or bleeding Side effects that usually do not require medical attention (report to your care team if they continue or are bothersome): Constipation  Diarrhea Fatigue Nausea Pain, tingling, or numbness in the hands or feet Swelling of the ankles, hands, or feet This list may not describe all possible side effects. Call your doctor for medical advice about side effects. You may report side effects to FDA at 1-800-FDA-1088. Where should I keep my medication? This medication is given in a hospital or clinic. It will not be stored at home. NOTE: This sheet is a summary. It may not cover all possible  information. If you have questions about this medicine, talk to your doctor, pharmacist, or health care provider.  2024 Elsevier/Gold Standard (2021-11-28 00:00:00)

## 2023-04-03 DIAGNOSIS — C9030 Solitary plasmacytoma not having achieved remission: Secondary | ICD-10-CM | POA: Diagnosis not present

## 2023-04-03 DIAGNOSIS — J45909 Unspecified asthma, uncomplicated: Secondary | ICD-10-CM | POA: Diagnosis not present

## 2023-04-03 DIAGNOSIS — Z79899 Other long term (current) drug therapy: Secondary | ICD-10-CM | POA: Diagnosis not present

## 2023-04-03 DIAGNOSIS — M8458XD Pathological fracture in neoplastic disease, other specified site, subsequent encounter for fracture with routine healing: Secondary | ICD-10-CM | POA: Diagnosis not present

## 2023-04-03 DIAGNOSIS — M1990 Unspecified osteoarthritis, unspecified site: Secondary | ICD-10-CM | POA: Diagnosis not present

## 2023-04-03 DIAGNOSIS — F419 Anxiety disorder, unspecified: Secondary | ICD-10-CM | POA: Diagnosis not present

## 2023-04-03 LAB — KAPPA/LAMBDA LIGHT CHAINS
Kappa free light chain: 174.6 mg/L — ABNORMAL HIGH (ref 3.3–19.4)
Kappa, lambda light chain ratio: 83.14 — ABNORMAL HIGH (ref 0.26–1.65)
Lambda free light chains: 2.1 mg/L — ABNORMAL LOW (ref 5.7–26.3)

## 2023-04-05 LAB — MULTIPLE MYELOMA PANEL, SERUM
Albumin SerPl Elph-Mcnc: 3.6 g/dL (ref 2.9–4.4)
Albumin/Glob SerPl: 1.7 (ref 0.7–1.7)
Alpha 1: 0.3 g/dL (ref 0.0–0.4)
Alpha2 Glob SerPl Elph-Mcnc: 0.8 g/dL (ref 0.4–1.0)
B-Globulin SerPl Elph-Mcnc: 0.9 g/dL (ref 0.7–1.3)
Gamma Glob SerPl Elph-Mcnc: 0.3 g/dL — ABNORMAL LOW (ref 0.4–1.8)
Globulin, Total: 2.2 g/dL (ref 2.2–3.9)
IgA: 9 mg/dL — ABNORMAL LOW (ref 64–422)
IgG (Immunoglobin G), Serum: 291 mg/dL — ABNORMAL LOW (ref 586–1602)
IgM (Immunoglobulin M), Srm: 10 mg/dL — ABNORMAL LOW (ref 26–217)
M Protein SerPl Elph-Mcnc: 0.1 g/dL — ABNORMAL HIGH
Total Protein ELP: 5.8 g/dL — ABNORMAL LOW (ref 6.0–8.5)

## 2023-04-09 ENCOUNTER — Inpatient Hospital Stay: Payer: Medicare Other

## 2023-04-09 ENCOUNTER — Inpatient Hospital Stay: Payer: Medicare Other | Attending: Hematology and Oncology

## 2023-04-09 ENCOUNTER — Ambulatory Visit: Payer: Medicare Other

## 2023-04-09 ENCOUNTER — Other Ambulatory Visit: Payer: Medicare Other

## 2023-04-09 VITALS — BP 118/62 | HR 88 | Temp 98.2°F | Resp 18 | Wt 121.2 lb

## 2023-04-09 DIAGNOSIS — Z79899 Other long term (current) drug therapy: Secondary | ICD-10-CM | POA: Insufficient documentation

## 2023-04-09 DIAGNOSIS — Z5112 Encounter for antineoplastic immunotherapy: Secondary | ICD-10-CM | POA: Diagnosis not present

## 2023-04-09 DIAGNOSIS — C9000 Multiple myeloma not having achieved remission: Secondary | ICD-10-CM | POA: Insufficient documentation

## 2023-04-09 LAB — CBC WITH DIFFERENTIAL (CANCER CENTER ONLY)
Abs Immature Granulocytes: 0.01 10*3/uL (ref 0.00–0.07)
Basophils Absolute: 0 10*3/uL (ref 0.0–0.1)
Basophils Relative: 1 %
Eosinophils Absolute: 0 10*3/uL (ref 0.0–0.5)
Eosinophils Relative: 0 %
HCT: 41.4 % (ref 36.0–46.0)
Hemoglobin: 14.1 g/dL (ref 12.0–15.0)
Immature Granulocytes: 0 %
Lymphocytes Relative: 3 %
Lymphs Abs: 0.2 10*3/uL — ABNORMAL LOW (ref 0.7–4.0)
MCH: 33.5 pg (ref 26.0–34.0)
MCHC: 34.1 g/dL (ref 30.0–36.0)
MCV: 98.3 fL (ref 80.0–100.0)
Monocytes Absolute: 0 10*3/uL — ABNORMAL LOW (ref 0.1–1.0)
Monocytes Relative: 1 %
Neutro Abs: 5.5 10*3/uL (ref 1.7–7.7)
Neutrophils Relative %: 95 %
Platelet Count: 215 10*3/uL (ref 150–400)
RBC: 4.21 MIL/uL (ref 3.87–5.11)
RDW: 12.4 % (ref 11.5–15.5)
Smear Review: NORMAL
WBC Count: 5.7 10*3/uL (ref 4.0–10.5)
nRBC: 0 % (ref 0.0–0.2)

## 2023-04-09 LAB — CMP (CANCER CENTER ONLY)
ALT: 10 U/L (ref 0–44)
AST: 13 U/L — ABNORMAL LOW (ref 15–41)
Albumin: 4.3 g/dL (ref 3.5–5.0)
Alkaline Phosphatase: 87 U/L (ref 38–126)
Anion gap: 8 (ref 5–15)
BUN: 13 mg/dL (ref 8–23)
CO2: 24 mmol/L (ref 22–32)
Calcium: 8.8 mg/dL — ABNORMAL LOW (ref 8.9–10.3)
Chloride: 105 mmol/L (ref 98–111)
Creatinine: 0.75 mg/dL (ref 0.44–1.00)
GFR, Estimated: >60 mL/min (ref >60)
Glucose, Bld: 275 mg/dL — ABNORMAL HIGH (ref 70–99)
Potassium: 3.8 mmol/L (ref 3.5–5.1)
Sodium: 137 mmol/L (ref 135–145)
Total Bilirubin: 0.5 mg/dL (ref 0.3–1.2)
Total Protein: 6.2 g/dL — ABNORMAL LOW (ref 6.5–8.1)

## 2023-04-09 MED ORDER — ZOLEDRONIC ACID 4 MG/5ML IV CONC
3.3000 mg | Freq: Once | INTRAVENOUS | Status: AC
  Administered 2023-04-09: 3.3 mg via INTRAVENOUS
  Filled 2023-04-09: qty 4.13

## 2023-04-09 MED ORDER — DIPHENHYDRAMINE HCL 25 MG PO CAPS
25.0000 mg | ORAL_CAPSULE | Freq: Once | ORAL | Status: AC
  Administered 2023-04-09: 25 mg via ORAL
  Filled 2023-04-09: qty 1

## 2023-04-09 MED ORDER — DARATUMUMAB-HYALURONIDASE-FIHJ 1800-30000 MG-UT/15ML ~~LOC~~ SOLN
1800.0000 mg | Freq: Once | SUBCUTANEOUS | Status: AC
  Administered 2023-04-09: 1800 mg via SUBCUTANEOUS
  Filled 2023-04-09: qty 15

## 2023-04-09 MED ORDER — SODIUM CHLORIDE 0.9 % IV SOLN: Freq: Once | INTRAVENOUS | Status: AC

## 2023-04-09 MED ORDER — MONTELUKAST SODIUM 10 MG PO TABS
10.0000 mg | ORAL_TABLET | Freq: Once | ORAL | Status: AC
  Administered 2023-04-09: 10 mg via ORAL
  Filled 2023-04-09: qty 1

## 2023-04-09 MED ORDER — ACETAMINOPHEN 325 MG PO TABS
650.0000 mg | ORAL_TABLET | Freq: Once | ORAL | Status: AC
  Administered 2023-04-09: 650 mg via ORAL
  Filled 2023-04-09: qty 2

## 2023-04-09 NOTE — Progress Notes (Signed)
Ok to proceed with Zometa today per Provider with low calcium level. Patient took steroid at home before coming.

## 2023-04-10 DIAGNOSIS — M1990 Unspecified osteoarthritis, unspecified site: Secondary | ICD-10-CM | POA: Diagnosis not present

## 2023-04-10 DIAGNOSIS — F419 Anxiety disorder, unspecified: Secondary | ICD-10-CM | POA: Diagnosis not present

## 2023-04-10 DIAGNOSIS — C9030 Solitary plasmacytoma not having achieved remission: Secondary | ICD-10-CM | POA: Diagnosis not present

## 2023-04-10 DIAGNOSIS — M8458XD Pathological fracture in neoplastic disease, other specified site, subsequent encounter for fracture with routine healing: Secondary | ICD-10-CM | POA: Diagnosis not present

## 2023-04-10 DIAGNOSIS — J45909 Unspecified asthma, uncomplicated: Secondary | ICD-10-CM | POA: Diagnosis not present

## 2023-04-10 DIAGNOSIS — Z79899 Other long term (current) drug therapy: Secondary | ICD-10-CM | POA: Diagnosis not present

## 2023-04-15 ENCOUNTER — Other Ambulatory Visit: Payer: Medicare Other

## 2023-04-15 ENCOUNTER — Ambulatory Visit: Payer: Medicare Other

## 2023-04-15 ENCOUNTER — Ambulatory Visit: Payer: Medicare Other | Admitting: Hematology and Oncology

## 2023-04-15 ENCOUNTER — Inpatient Hospital Stay: Payer: Medicare Other

## 2023-04-15 ENCOUNTER — Inpatient Hospital Stay (HOSPITAL_BASED_OUTPATIENT_CLINIC_OR_DEPARTMENT_OTHER): Payer: Medicare Other | Admitting: Hematology and Oncology

## 2023-04-15 VITALS — BP 121/61 | HR 86 | Resp 16

## 2023-04-15 VITALS — BP 146/79 | HR 73 | Temp 98.0°F | Resp 15 | Wt 122.4 lb

## 2023-04-15 DIAGNOSIS — C9000 Multiple myeloma not having achieved remission: Secondary | ICD-10-CM

## 2023-04-15 DIAGNOSIS — G893 Neoplasm related pain (acute) (chronic): Secondary | ICD-10-CM

## 2023-04-15 DIAGNOSIS — R530 Neoplastic (malignant) related fatigue: Secondary | ICD-10-CM | POA: Diagnosis not present

## 2023-04-15 DIAGNOSIS — Z5112 Encounter for antineoplastic immunotherapy: Secondary | ICD-10-CM | POA: Diagnosis not present

## 2023-04-15 DIAGNOSIS — Z79899 Other long term (current) drug therapy: Secondary | ICD-10-CM | POA: Diagnosis not present

## 2023-04-15 LAB — CBC WITH DIFFERENTIAL (CANCER CENTER ONLY)
Abs Immature Granulocytes: 0.02 10*3/uL (ref 0.00–0.07)
Basophils Absolute: 0 10*3/uL (ref 0.0–0.1)
Basophils Relative: 1 %
Eosinophils Absolute: 0.1 10*3/uL (ref 0.0–0.5)
Eosinophils Relative: 1 %
HCT: 41.4 % (ref 36.0–46.0)
Hemoglobin: 14.4 g/dL (ref 12.0–15.0)
Immature Granulocytes: 0 %
Lymphocytes Relative: 6 %
Lymphs Abs: 0.4 10*3/uL — ABNORMAL LOW (ref 0.7–4.0)
MCH: 34.4 pg — ABNORMAL HIGH (ref 26.0–34.0)
MCHC: 34.8 g/dL (ref 30.0–36.0)
MCV: 98.8 fL (ref 80.0–100.0)
Monocytes Absolute: 0.2 10*3/uL (ref 0.1–1.0)
Monocytes Relative: 3 %
Neutro Abs: 5.1 10*3/uL (ref 1.7–7.7)
Neutrophils Relative %: 89 %
Platelet Count: 215 10*3/uL (ref 150–400)
RBC: 4.19 MIL/uL (ref 3.87–5.11)
RDW: 12.5 % (ref 11.5–15.5)
WBC Count: 5.7 10*3/uL (ref 4.0–10.5)
nRBC: 0 % (ref 0.0–0.2)

## 2023-04-15 LAB — CMP (CANCER CENTER ONLY)
ALT: 9 U/L (ref 0–44)
AST: 12 U/L — ABNORMAL LOW (ref 15–41)
Albumin: 4.2 g/dL (ref 3.5–5.0)
Alkaline Phosphatase: 85 U/L (ref 38–126)
Anion gap: 6 (ref 5–15)
BUN: 15 mg/dL (ref 8–23)
CO2: 26 mmol/L (ref 22–32)
Calcium: 9 mg/dL (ref 8.9–10.3)
Chloride: 107 mmol/L (ref 98–111)
Creatinine: 0.69 mg/dL (ref 0.44–1.00)
GFR, Estimated: >60 mL/min (ref >60)
Glucose, Bld: 132 mg/dL — ABNORMAL HIGH (ref 70–99)
Potassium: 4.2 mmol/L (ref 3.5–5.1)
Sodium: 139 mmol/L (ref 135–145)
Total Bilirubin: 0.4 mg/dL (ref 0.3–1.2)
Total Protein: 6.1 g/dL — ABNORMAL LOW (ref 6.5–8.1)

## 2023-04-15 MED ORDER — DIPHENHYDRAMINE HCL 25 MG PO CAPS
25.0000 mg | ORAL_CAPSULE | Freq: Once | ORAL | Status: AC
  Administered 2023-04-15: 25 mg via ORAL
  Filled 2023-04-15: qty 1

## 2023-04-15 MED ORDER — MONTELUKAST SODIUM 10 MG PO TABS
10.0000 mg | ORAL_TABLET | Freq: Once | ORAL | Status: AC
  Administered 2023-04-15: 10 mg via ORAL
  Filled 2023-04-15: qty 1

## 2023-04-15 MED ORDER — ACETAMINOPHEN 325 MG PO TABS
650.0000 mg | ORAL_TABLET | Freq: Once | ORAL | Status: AC
  Administered 2023-04-15: 650 mg via ORAL
  Filled 2023-04-15: qty 2

## 2023-04-15 MED ORDER — DARATUMUMAB-HYALURONIDASE-FIHJ 1800-30000 MG-UT/15ML ~~LOC~~ SOLN
1800.0000 mg | Freq: Once | SUBCUTANEOUS | Status: AC
  Administered 2023-04-15: 1800 mg via SUBCUTANEOUS
  Filled 2023-04-15: qty 15

## 2023-04-15 NOTE — Progress Notes (Unsigned)
[facility] [phone]   [phone]  PROGRESS NOTE  Patient Care Team: [name], MD as PCP - General (Family Medicine) Pickenpack-Cousar, [name], NP as Nurse Practitioner (Nurse Practitioner)  Hematological/Oncological History # Plasmacytoma of Thoracic Spine  # IgG Kappa Multiple myeloma 09/22/2022: Patient underwent MRI thoracic spine which showed acute or subacute T7, T10, and T11 compression fractures 10/10/2022: biopsy of compression fracture of T11 reveals a plasmacytoma 11/13/2022: establish care with Dr. Leonides Schanz  12/06/2022: bone marrow biopsy showed plasma cell myeloma involving 80% of the marrow.  12/24/2022: Cycle 1 Day 1 of Velcade/Dex 01/14/2023: Cycle 2 Day 1 of Velcade/Dex 02/04/2023: Cycle 3 Day 1 of Velcade/Dex 02/26/2023: Cycle 4 Day 1 of Velcade/Dex HELD due to ocular toxicity 03/04/2023: Cycle 1 Day 1 of Dara/Dex   Interval History:  Alice Anthony 82 y.o. female with medical history significant for newly diagnosed IgG kappa multiple myeloma who presents for a follow up visit. The patient's last visit was on 03/25/2023.  In the interim since the last visit she has continued on Velcade therapy.   On exam today Alice Anthony reports she would like to reduce her steroid dosage down to 20 mg of Dex because she feels like the higher dose has been challenging for her.  She reports that the stye in her left eye is still there but resolving.  She is using salve as well as occasional hot compresses.  She reports there is rarely some blurriness of vision.  She is not having any nausea or vomiting but does have some diarrhea the day after chemotherapy.  She notes her appetite is "so-so" mostly because her taste has been off.  She notes that she took some of her sweet potato pie with a secret ingredient of vanilla.  Overall she feels well and is not having any major side effects as a result of her chemotherapy treatment..  Otherwise she is having no  symptoms.  She is willing and able to continue with chemotherapy at this time. She denies any fevers, chills, sweats, shortness of breath, chest pain or cough.  A full 10 point ROS is otherwise negative.   MEDICAL HISTORY:  Past Medical History:  Diagnosis Date   Anxiety    Arthritis    Asthma    Hx: of   Cancer (HCC)    Hx: of skin cancer   Diverticulosis    GERD (gastroesophageal reflux disease)    Hx: of   History of kidney stones    Hyperlipemia    Hypertension    Pre-diabetes     SURGICAL HISTORY: Past Surgical History:  Procedure Laterality Date   ABDOMINAL SURGERY  09/19/2012   Ruptured Diverticuli, Ostomy Placed.   BREAST BIOPSY  06/06/2012   breast biopsy   CATARACT EXTRACTION W/ INTRAOCULAR LENS IMPLANT Bilateral    COLONOSCOPY  03/06/2012   COLOSTOMY CLOSURE  12/25/2012   Dr Luisa Hart   COLOSTOMY CLOSURE N/A 12/25/2012   Procedure: COLOSTOMY CLOSURE;  Surgeon: Clovis Pu. Cornett, MD;  Location: [facility] OR;  Service: General;  Laterality: N/A;   EXTRACORPOREAL SHOCK WAVE LITHOTRIPSY Left 11/17/2018   Procedure: EXTRACORPOREAL SHOCK WAVE LITHOTRIPSY (ESWL);  Surgeon: Malen Gauze, MD;  Location: [facility];  Service: Urology;  Laterality: Left;   EYE SURGERY  10/19/1998   Hx: of cyst removed from left eye   KYPHOPLASTY N/A 10/10/2022   Procedure: KYPHOPLASTY Thoracic seven , Thoracic ten,Thoracic eleven;  Surgeon: Tressie Stalker, MD;  Location: [facility] OR;  Service: Neurosurgery;  Laterality: N/A;   LAPAROTOMY N/A 09/19/2012   Procedure: EXPLORATORY LAPAROTOMY, sigmoid colectomy;  Surgeon: Clovis Pu. Cornett, MD;  Location: [facility];  Service: General;  Laterality: N/A;   SIGMOIDOSCOPY  01/11/1999   TONSILLECTOMY     TRIGGER FINGER RELEASE  12/25/2010   TUBAL LIGATION  10/16/1985    SOCIAL HISTORY: Social History   Socioeconomic History   Marital status: Widowed    Spouse name: Not on file   Number of children: Not on file   Years of education: Not on file    Highest education level: Not on file  Occupational History   Not on file  Tobacco Use   Smoking status: Never   Smokeless tobacco: Never  Vaping Use   Vaping status: Never Used  Substance and Sexual Activity   Alcohol use: No   Drug use: No   Sexual activity: Not Currently  Other Topics Concern   Not on file  Social History Narrative   Not on file   Social Determinants of Health   Financial Resource Strain: Not on file  Food Insecurity: Not on file  Transportation Needs: Not on file  Physical Activity: Not on file  Stress: Not on file  Social Connections: Not on file  Intimate Partner Violence: Not on file    FAMILY HISTORY: Family History  Problem Relation Age of Onset   Diabetes Father    Alzheimer's disease Father    Basal cell carcinoma Mother        Metastatic    ALLERGIES:  is allergic to hydrocodone, vioxx [rofecoxib], codeine, doxycycline hyclate, and latex.  MEDICATIONS:  Current Outpatient Medications  Medication Sig Dispense Refill   metoCLOPramide (REGLAN) 10 MG tablet Take 1 tablet (10 mg total) by mouth every 6 (six) hours. 60 tablet 0   acyclovir (ZOVIRAX) 400 MG tablet Take 1 tablet (400 mg total) by mouth 2 (two) times daily. 180 tablet 1   ALPRAZolam (XANAX) 0.25 MG tablet Take 0.25 mg by mouth 3 (three) times daily as needed.     amLODipine (NORVASC) 5 MG tablet Take 5 mg by mouth daily.     aspirin 81 MG tablet Take 81 mg by mouth daily.     bacitracin-neomycin-polymyxin-hydrocortisone (CORTISPORIN) 1 % ophthalmic ointment Place 1 Application into the right eye 2 (two) times daily. (Patient taking differently: Place 1 Application into the right eye 2 (two) times daily. Direction on bottle are three times a day for 7 days) 3.5 g 0   cetirizine (ZYRTEC) 10 MG tablet Take 10 mg by mouth at bedtime.     Cholecalciferol (VITAMIN D3) 2000 UNITS capsule Take 2,000 Units by mouth daily.     dexamethasone (DECADRON) 4 MG tablet Take 40 mg (10 tablets) by  mouth once a week. 40 tablet 3   dextromethorphan-guaiFENesin (TUSSIN DM) 10-100 MG/5ML liquid Take 10 mLs by mouth every 4 (four) hours as needed for cough.     fluticasone (FLONASE) 50 MCG/ACT nasal spray Place 2 sprays into the nose at bedtime.     hydrOXYzine (ATARAX) 25 MG tablet Take 25 mg by mouth at bedtime.     Probiotic Product (ALIGN PO) Take by mouth daily.     simvastatin (ZOCOR) 20 MG tablet Take 20 mg by mouth every evening.     traMADol (ULTRAM) 50 MG tablet TAKE 1 TO 2 TABLETS BY MOUTH EVERY 4 HOURS AS NEEDED (Patient taking differently: Take 50-100 mg by mouth every 4 (four) hours as needed.  Currently taking 2 tablets PO daily) 90 tablet 0   No current facility-administered medications for this visit.    REVIEW OF SYSTEMS:   Constitutional: ( - ) fevers, ( - )  chills , ( - ) night sweats Eyes: ( - ) blurriness of vision, ( - ) double vision, ( - ) watery eyes Ears, nose, mouth, throat, and face: ( - ) mucositis, ( - ) sore throat Respiratory: ( - ) cough, ( - ) dyspnea, ( - ) wheezes Cardiovascular: ( - ) palpitation, ( - ) chest discomfort, ( - ) lower extremity swelling Gastrointestinal:  ( - ) nausea, ( - ) heartburn, ( - ) change in bowel habits Skin: ( - ) abnormal skin rashes Lymphatics: ( - ) new lymphadenopathy, ( - ) easy bruising Neurological: ( - ) numbness, ( - ) tingling, ( - ) new weaknesses Behavioral/Psych: ( - ) mood change, ( - ) new changes  All other systems were reviewed with the patient and are negative.  PHYSICAL EXAMINATION: ECOG PERFORMANCE STATUS: 1 - Symptomatic but completely ambulatory  Vitals:   04/15/23 1149  BP: (!) 146/79  Pulse: 73  Resp: 15  Temp: 98 F (36.7 C)  SpO2: 98%       Filed Weights   04/15/23 1149  Weight: 122 lb 6.4 oz (55.5 kg)       GENERAL: Well-appearing elderly Caucasian female, alert, no distress and comfortable SKIN: skin color, texture, turgor are normal, no rashes or significant lesions EYES:  conjunctiva are pink and non-injected, sclera clear. Stye on lower eyelid of right eye that is erythematous. No discharge noted.  LUNGS: clear to auscultation and percussion with normal breathing effort HEART: regular rate & rhythm and no murmurs and no lower extremity edema Musculoskeletal: no cyanosis of digits and no clubbing  PSYCH: alert & oriented x 3, fluent speech NEURO: no focal motor/sensory deficits  LABORATORY DATA:  I have reviewed the data as listed    Latest Ref Rng & Units 04/15/2023   10:28 AM 04/09/2023    1:26 PM 04/01/2023    1:45 PM  CBC  WBC 4.0 - 10.5 K/uL 5.7  5.7  5.7   Hemoglobin 12.0 - 15.0 g/dL 16.1  09.6  04.5   Hematocrit 36.0 - 46.0 % 41.4  41.4  42.2   Platelets 150 - 400 K/uL 215  215  183        Latest Ref Rng & Units 04/15/2023   10:28 AM 04/09/2023    1:26 PM 04/01/2023    1:45 PM  CMP  Glucose 70 - 99 mg/dL 409  811  914   BUN 8 - 23 mg/dL 15  13  12    Creatinine 0.44 - 1.00 mg/dL 7.82  9.56  2.13   Sodium 135 - 145 mmol/L 139  137  137   Potassium 3.5 - 5.1 mmol/L 4.2  3.8  3.8   Chloride 98 - 111 mmol/L 107  105  104   CO2 22 - 32 mmol/L 26  24  24    Calcium 8.9 - 10.3 mg/dL 9.0  8.8  8.7   Total Protein 6.5 - 8.1 g/dL 6.1  6.2  6.2   Total Bilirubin 0.3 - 1.2 mg/dL 0.4  0.5  0.6   Alkaline Phos 38 - 126 U/L 85  87  86   AST 15 - 41 U/L 12  13  12    ALT 0 - 44 U/L 9  10  9     Lab Results  Component Value Date   MPROTEIN 0.1 (H) 04/01/2023   MPROTEIN Not Observed 02/26/2023   MPROTEIN Not Observed 01/28/2023   Lab Results  Component Value Date   KPAFRELGTCHN 174.6 (H) 04/01/2023   KPAFRELGTCHN 235.6 (H) 02/26/2023   KPAFRELGTCHN 714.1 (H) 01/28/2023   LAMBDASER 2.1 (L) 04/01/2023   LAMBDASER 3.3 (L) 02/26/2023   LAMBDASER 2.9 (L) 01/28/2023   KAPLAMBRATIO 83.14 (H) 04/01/2023   KAPLAMBRATIO 71.39 (H) 02/26/2023   KAPLAMBRATIO 246.24 (H) 01/28/2023    RADIOGRAPHIC STUDIES: No results found.  ASSESSMENT & PLAN Alice Anthony  82 y.o. female with medical history significant for IgG kappa multiple myeloma who presents for a follow up visit.   # IgG Kappa Multiple Myeloma # Plasmacytoma of Spine  -- Patient diagnosed with plasmacytoma of the spine, subsequent bone marrow biopsy confirmed an IgG kappa multiple myeloma involving the bone marrow --start of Velcade/Dex Cycle 1 Day 1 on 12/24/2022. Plan: -- Labs today show creatinine 0.69, white blood cell 5.7, Hgb 14.4, MCV 98.8, Plt 215 --Due to ocular toxicity, recommend to discontinue Velcade therapy and switch to Daratumumab.  Patient is still recovering from styes. --Last M protein 0.1 on 04/01/2023 with kappa free light chains of 174.6, lambda 2.1, and ratio of 83.14 --Can consider the addition of Revlimid to the patient's treatment regimen at a later time.  Her hemoglobin is strong and her kidney function is good, we should be able to add this shortly. -- Return to clinic in 2 weeks with continued Dara/Dex.   #Ocular toxicity/Stye --Secondary to Velcade therapy --Used Moxifloxacin drops with improvement --Received azithromycin x 5 days.   #Supportive Care -- chemotherapy education complete -- port placement not required   -- zofran 8mg  q8H PRN and compazine 10mg  PO q6H for nausea -- acyclovir 400mg  PO BID for VCZ prophylaxis -- Recieved dental clearance prior to the start of Xgeva/Zometa. First dose on 01/07/2023. Last performed on 04/09/2023. Next dose in Dec 2024.  -- Tramadol 50 mg every 6 hours as needed for pain control  No orders of the defined types were placed in this encounter.  All questions were answered. The patient knows to call the clinic with any problems, questions or concerns.  I have spent a total of 30 minutes minutes of face-to-face and non-face-to-face time, preparing to see the patient, performing a medically appropriate examination, counseling and educating the patient,  documenting clinical information in the electronic health record,and  care coordination.   Ulysees Barns, MD Department of Hematology/Oncology [facility] Phone: [phone] Pager: [phone] Email: [email or web address]   04/16/2023 4:40 PM

## 2023-04-15 NOTE — Progress Notes (Signed)
Per patient, dexamethasone 40mg  was taken at home prior to appts today.

## 2023-04-16 ENCOUNTER — Encounter: Payer: Self-pay | Admitting: Hematology and Oncology

## 2023-04-16 DIAGNOSIS — C9030 Solitary plasmacytoma not having achieved remission: Secondary | ICD-10-CM | POA: Diagnosis not present

## 2023-04-16 DIAGNOSIS — J45909 Unspecified asthma, uncomplicated: Secondary | ICD-10-CM | POA: Diagnosis not present

## 2023-04-16 DIAGNOSIS — M8458XD Pathological fracture in neoplastic disease, other specified site, subsequent encounter for fracture with routine healing: Secondary | ICD-10-CM | POA: Diagnosis not present

## 2023-04-16 DIAGNOSIS — Z79899 Other long term (current) drug therapy: Secondary | ICD-10-CM | POA: Diagnosis not present

## 2023-04-16 DIAGNOSIS — F419 Anxiety disorder, unspecified: Secondary | ICD-10-CM | POA: Diagnosis not present

## 2023-04-16 DIAGNOSIS — M1990 Unspecified osteoarthritis, unspecified site: Secondary | ICD-10-CM | POA: Diagnosis not present

## 2023-04-18 ENCOUNTER — Other Ambulatory Visit: Payer: Self-pay | Admitting: Nurse Practitioner

## 2023-04-18 DIAGNOSIS — C9000 Multiple myeloma not having achieved remission: Secondary | ICD-10-CM

## 2023-04-18 DIAGNOSIS — Z515 Encounter for palliative care: Secondary | ICD-10-CM

## 2023-04-18 DIAGNOSIS — G893 Neoplasm related pain (acute) (chronic): Secondary | ICD-10-CM

## 2023-04-20 ENCOUNTER — Other Ambulatory Visit: Payer: Self-pay | Admitting: Hematology and Oncology

## 2023-04-21 DIAGNOSIS — J45909 Unspecified asthma, uncomplicated: Secondary | ICD-10-CM | POA: Diagnosis not present

## 2023-04-21 DIAGNOSIS — F419 Anxiety disorder, unspecified: Secondary | ICD-10-CM | POA: Diagnosis not present

## 2023-04-21 DIAGNOSIS — M8458XD Pathological fracture in neoplastic disease, other specified site, subsequent encounter for fracture with routine healing: Secondary | ICD-10-CM | POA: Diagnosis not present

## 2023-04-21 DIAGNOSIS — Z7982 Long term (current) use of aspirin: Secondary | ICD-10-CM | POA: Diagnosis not present

## 2023-04-21 DIAGNOSIS — Z87442 Personal history of urinary calculi: Secondary | ICD-10-CM | POA: Diagnosis not present

## 2023-04-21 DIAGNOSIS — C9030 Solitary plasmacytoma not having achieved remission: Secondary | ICD-10-CM | POA: Diagnosis not present

## 2023-04-21 DIAGNOSIS — K5790 Diverticulosis of intestine, part unspecified, without perforation or abscess without bleeding: Secondary | ICD-10-CM | POA: Diagnosis not present

## 2023-04-21 DIAGNOSIS — K219 Gastro-esophageal reflux disease without esophagitis: Secondary | ICD-10-CM | POA: Diagnosis not present

## 2023-04-21 DIAGNOSIS — Z853 Personal history of malignant neoplasm of breast: Secondary | ICD-10-CM | POA: Diagnosis not present

## 2023-04-21 DIAGNOSIS — M1990 Unspecified osteoarthritis, unspecified site: Secondary | ICD-10-CM | POA: Diagnosis not present

## 2023-04-21 DIAGNOSIS — Z85828 Personal history of other malignant neoplasm of skin: Secondary | ICD-10-CM | POA: Diagnosis not present

## 2023-04-21 DIAGNOSIS — E785 Hyperlipidemia, unspecified: Secondary | ICD-10-CM | POA: Diagnosis not present

## 2023-04-21 DIAGNOSIS — I10 Essential (primary) hypertension: Secondary | ICD-10-CM | POA: Diagnosis not present

## 2023-04-21 DIAGNOSIS — R7303 Prediabetes: Secondary | ICD-10-CM | POA: Diagnosis not present

## 2023-04-21 DIAGNOSIS — Z79899 Other long term (current) drug therapy: Secondary | ICD-10-CM | POA: Diagnosis not present

## 2023-04-21 DIAGNOSIS — K5903 Drug induced constipation: Secondary | ICD-10-CM | POA: Diagnosis not present

## 2023-04-22 ENCOUNTER — Encounter: Payer: Self-pay | Admitting: Nurse Practitioner

## 2023-04-22 ENCOUNTER — Inpatient Hospital Stay: Payer: Medicare Other

## 2023-04-22 ENCOUNTER — Ambulatory Visit: Payer: Medicare Other | Admitting: Hematology and Oncology

## 2023-04-22 ENCOUNTER — Other Ambulatory Visit: Payer: Medicare Other

## 2023-04-22 ENCOUNTER — Inpatient Hospital Stay (HOSPITAL_BASED_OUTPATIENT_CLINIC_OR_DEPARTMENT_OTHER): Payer: Medicare Other | Admitting: Nurse Practitioner

## 2023-04-22 ENCOUNTER — Ambulatory Visit: Payer: Medicare Other

## 2023-04-22 VITALS — BP 135/69 | HR 90 | Temp 98.5°F | Resp 18 | Ht 61.0 in | Wt 123.0 lb

## 2023-04-22 DIAGNOSIS — C9000 Multiple myeloma not having achieved remission: Secondary | ICD-10-CM

## 2023-04-22 DIAGNOSIS — K5900 Constipation, unspecified: Secondary | ICD-10-CM | POA: Diagnosis not present

## 2023-04-22 DIAGNOSIS — Z5112 Encounter for antineoplastic immunotherapy: Secondary | ICD-10-CM | POA: Diagnosis not present

## 2023-04-22 DIAGNOSIS — Z515 Encounter for palliative care: Secondary | ICD-10-CM | POA: Diagnosis not present

## 2023-04-22 DIAGNOSIS — Z79899 Other long term (current) drug therapy: Secondary | ICD-10-CM | POA: Diagnosis not present

## 2023-04-22 DIAGNOSIS — G893 Neoplasm related pain (acute) (chronic): Secondary | ICD-10-CM | POA: Diagnosis not present

## 2023-04-22 LAB — CMP (CANCER CENTER ONLY)
ALT: 9 U/L (ref 0–44)
AST: 12 U/L — ABNORMAL LOW (ref 15–41)
Albumin: 4.3 g/dL (ref 3.5–5.0)
Alkaline Phosphatase: 83 U/L (ref 38–126)
Anion gap: 8 (ref 5–15)
BUN: 16 mg/dL (ref 8–23)
CO2: 22 mmol/L (ref 22–32)
Calcium: 8.8 mg/dL — ABNORMAL LOW (ref 8.9–10.3)
Chloride: 107 mmol/L (ref 98–111)
Creatinine: 0.71 mg/dL (ref 0.44–1.00)
GFR, Estimated: >60 mL/min (ref >60)
Glucose, Bld: 286 mg/dL — ABNORMAL HIGH (ref 70–99)
Potassium: 4 mmol/L (ref 3.5–5.1)
Sodium: 137 mmol/L (ref 135–145)
Total Bilirubin: 0.4 mg/dL (ref 0.3–1.2)
Total Protein: 6.4 g/dL — ABNORMAL LOW (ref 6.5–8.1)

## 2023-04-22 LAB — CBC WITH DIFFERENTIAL (CANCER CENTER ONLY)
Abs Immature Granulocytes: 0.02 10*3/uL (ref 0.00–0.07)
Basophils Absolute: 0 10*3/uL (ref 0.0–0.1)
Basophils Relative: 0 %
Eosinophils Absolute: 0 10*3/uL (ref 0.0–0.5)
Eosinophils Relative: 0 %
HCT: 42.2 % (ref 36.0–46.0)
Hemoglobin: 14.6 g/dL (ref 12.0–15.0)
Immature Granulocytes: 0 %
Lymphocytes Relative: 3 %
Lymphs Abs: 0.2 10*3/uL — ABNORMAL LOW (ref 0.7–4.0)
MCH: 33.8 pg (ref 26.0–34.0)
MCHC: 34.6 g/dL (ref 30.0–36.0)
MCV: 97.7 fL (ref 80.0–100.0)
Monocytes Absolute: 0 10*3/uL — ABNORMAL LOW (ref 0.1–1.0)
Monocytes Relative: 0 %
Neutro Abs: 6.9 10*3/uL (ref 1.7–7.7)
Neutrophils Relative %: 97 %
Platelet Count: 214 10*3/uL (ref 150–400)
RBC: 4.32 MIL/uL (ref 3.87–5.11)
RDW: 12.5 % (ref 11.5–15.5)
WBC Count: 7.2 10*3/uL (ref 4.0–10.5)
nRBC: 0 % (ref 0.0–0.2)

## 2023-04-22 MED ORDER — MONTELUKAST SODIUM 10 MG PO TABS
10.0000 mg | ORAL_TABLET | Freq: Once | ORAL | Status: AC
  Administered 2023-04-22: 10 mg via ORAL
  Filled 2023-04-22: qty 1

## 2023-04-22 MED ORDER — DIPHENHYDRAMINE HCL 25 MG PO CAPS
25.0000 mg | ORAL_CAPSULE | Freq: Once | ORAL | Status: AC
  Administered 2023-04-22: 25 mg via ORAL
  Filled 2023-04-22: qty 1

## 2023-04-22 MED ORDER — ACETAMINOPHEN 325 MG PO TABS
650.0000 mg | ORAL_TABLET | Freq: Once | ORAL | Status: AC
  Administered 2023-04-22: 650 mg via ORAL
  Filled 2023-04-22: qty 2

## 2023-04-22 MED ORDER — DARATUMUMAB-HYALURONIDASE-FIHJ 1800-30000 MG-UT/15ML ~~LOC~~ SOLN
1800.0000 mg | Freq: Once | SUBCUTANEOUS | Status: AC
  Administered 2023-04-22: 1800 mg via SUBCUTANEOUS
  Filled 2023-04-22: qty 15

## 2023-04-22 NOTE — Progress Notes (Signed)
Pt. States she took Decadron 20 mg at home this afternoon.

## 2023-04-22 NOTE — Progress Notes (Signed)
Palliative Medicine [facility]  Telephone:([phone] Fax:([phone]   Name: Alice Anthony Date: 04/22/2023 MRN: [id number]  DOB: 1940-11-15  Patient Care Team: [name], MD as PCP - General (Family Medicine) Pickenpack-Cousar, [name], NP as Nurse Practitioner (Nurse Practitioner)    INTERVAL HISTORY: Alice Anthony is a 82 y.o. female with oncologic medical history including recent diagnosis of multiple myeloma (12/2022), DM, and posterior vitreous detachment of left and right eyes. Palliative ask to see for symptom management and goals of care   SOCIAL HISTORY:     reports that she has never smoked. She has never used smokeless tobacco. She reports that she does not drink alcohol and does not use drugs.  ADVANCE DIRECTIVES:  Advanced directives on file  CODE STATUS: Full code  PAST MEDICAL HISTORY: Past Medical History:  Diagnosis Date   Anxiety    Arthritis    Asthma    Hx: of   Cancer (HCC)    Hx: of skin cancer   Diverticulosis    GERD (gastroesophageal reflux disease)    Hx: of   History of kidney stones    Hyperlipemia    Hypertension    Pre-diabetes     ALLERGIES:  is allergic to hydrocodone, vioxx [rofecoxib], codeine, doxycycline hyclate, and latex.  MEDICATIONS:  Current Outpatient Medications  Medication Sig Dispense Refill   acyclovir (ZOVIRAX) 400 MG tablet Take 1 tablet (400 mg total) by mouth 2 (two) times daily. 180 tablet 1   ALPRAZolam (XANAX) 0.25 MG tablet Take 0.25 mg by mouth 3 (three) times daily as needed.     amLODipine (NORVASC) 5 MG tablet Take 5 mg by mouth daily.     aspirin 81 MG tablet Take 81 mg by mouth daily.     bacitracin-neomycin-polymyxin-hydrocortisone (CORTISPORIN) 1 % ophthalmic ointment Place 1 Application into the right eye 2 (two) times daily. (Patient taking differently: Place 1 Application into the right eye 2 (two) times daily. Direction on bottle are three times a day for 7 days)  3.5 g 0   cetirizine (ZYRTEC) 10 MG tablet Take 10 mg by mouth at bedtime.     Cholecalciferol (VITAMIN D3) 2000 UNITS capsule Take 2,000 Units by mouth daily.     dexamethasone (DECADRON) 4 MG tablet Take 40 mg (10 tablets) by mouth once a week. 40 tablet 3   dextromethorphan-guaiFENesin (TUSSIN DM) 10-100 MG/5ML liquid Take 10 mLs by mouth every 4 (four) hours as needed for cough.     fluticasone (FLONASE) 50 MCG/ACT nasal spray Place 2 sprays into the nose at bedtime.     hydrOXYzine (ATARAX) 25 MG tablet Take 25 mg by mouth at bedtime.     metoCLOPramide (REGLAN) 10 MG tablet Take 1 tablet (10 mg total) by mouth every 6 (six) hours. 60 tablet 0   Probiotic Product (ALIGN PO) Take by mouth daily.     simvastatin (ZOCOR) 20 MG tablet Take 20 mg by mouth every evening.     traMADol (ULTRAM) 50 MG tablet TAKE 1 TO 2 TABLETS BY MOUTH EVERY 4 HOURS AS NEEDED 90 tablet 0   No current facility-administered medications for this visit.    VITAL SIGNS: There were no vitals taken for this visit. There were no vitals filed for this visit.  Estimated body mass index is 23.13 kg/m as calculated from the following:   Height as of 02/26/23: 5\' 1"  (1.549 m).   Weight as of 04/15/23: 122 lb 6.4  oz (55.5 kg).   PERFORMANCE STATUS (ECOG) : 2 - Symptomatic, <50% confined to bed   Physical Exam General: NAD, sitting in recliner  Cardiovascular: regular rate and rhythm Pulmonary: normal breathing pattern  Extremities: no edema, no joint deformities Skin: no rashes Neurological: AAO x4  IMPRESSION: I saw Alice Anthony during her infusion. No acute distress noted. Resting comfortably in recliner. Denies nausea or vomiting. Taking things one day at a time. Overall doing well. Appetite is good.   Neoplasm related pain Ms. Mangini reports pain is well controlled on current regimen. She is tolerating Tramadol 50-100mg .    We discussed continuing current regimen without changes at this time. Taking  medication as needed.   Will continue close follow-up.    Nausea  Controlled   3. Constipation Is better controlled with regimen. Challenging at times.   4. Goals of Care  5/20- We discussed her current illness and what it means in the larger context of her on-going co-morbidities. Natural disease trajectory and expectations were discussed.   Patient and daughter are realistic in their understanding of current illness. Is remaining hopeful for stability and improve quality of life.   We discussed Her current illness and what it means in the larger context of Her on-going co-morbidities. Natural disease trajectory and expectations were discussed.  I discussed the importance of continued conversation with family and their medical providers regarding overall plan of care and treatment options, ensuring decisions are within the context of the patients values and GOCs.  PLAN:  Tramadol 50-100 mg every 4-6 hours as needed Senna-S 2 pills twice daily Compazine as needed Reglan every 6 hours Ongoing support and symptom management I will plan to see patient back in 3-4 weeks in collaboration to other oncology appointments.    Patient expressed understanding and was in agreement with this plan. She also understands that She can call the clinic at any time with any questions, concerns, or complaints.   Any controlled substances utilized were prescribed in the context of palliative care. PDMP has been reviewed.    Visit consisted of counseling and education dealing with the complex and emotionally intense issues of symptom management and palliative care in the setting of serious and potentially life-threatening illness.Greater than 50%  of this time was spent counseling and coordinating care related to the above assessment and plan.  Alice Anthony, AGPCNP-BC  Palliative Medicine Team/[facility]  *Please note that this is a verbal dictation therefore any spelling  or grammatical errors are due to the "[employer] One" system interpretation.

## 2023-04-24 DIAGNOSIS — J45909 Unspecified asthma, uncomplicated: Secondary | ICD-10-CM | POA: Diagnosis not present

## 2023-04-24 DIAGNOSIS — F419 Anxiety disorder, unspecified: Secondary | ICD-10-CM | POA: Diagnosis not present

## 2023-04-24 DIAGNOSIS — M1990 Unspecified osteoarthritis, unspecified site: Secondary | ICD-10-CM | POA: Diagnosis not present

## 2023-04-24 DIAGNOSIS — M8458XD Pathological fracture in neoplastic disease, other specified site, subsequent encounter for fracture with routine healing: Secondary | ICD-10-CM | POA: Diagnosis not present

## 2023-04-24 DIAGNOSIS — C9030 Solitary plasmacytoma not having achieved remission: Secondary | ICD-10-CM | POA: Diagnosis not present

## 2023-04-24 DIAGNOSIS — E785 Hyperlipidemia, unspecified: Secondary | ICD-10-CM | POA: Diagnosis not present

## 2023-04-28 NOTE — Progress Notes (Unsigned)
[facility] [phone]   [phone]  PROGRESS NOTE  Patient Care Team: [name], MD as PCP - General (Family Medicine) Pickenpack-Cousar, [name], NP as Nurse Practitioner (Nurse Practitioner)  Hematological/Oncological History # Plasmacytoma of Thoracic Spine  # IgG Kappa Multiple myeloma 09/22/2022: Patient underwent MRI thoracic spine which showed acute or subacute T7, T10, and T11 compression fractures 10/10/2022: biopsy of compression fracture of T11 reveals a plasmacytoma 11/13/2022: establish care with Dr. Leonides Schanz  12/06/2022: bone marrow biopsy showed plasma cell myeloma involving 80% of the marrow.  12/24/2022: Cycle 1 Day 1 of Velcade/Dex 01/14/2023: Cycle 2 Day 1 of Velcade/Dex 02/04/2023: Cycle 3 Day 1 of Velcade/Dex 02/26/2023: Cycle 4 Day 1 of Velcade/Dex HELD due to ocular toxicity 03/04/2023: Cycle 1 Day 1 of Dara/Dex  04/01/2023: Cycle 2 Day 1 of Dara/Dex  04/29/2023: Cycle 3 Day 1 of Dara/Dex   Interval History:  Alice Anthony 82 y.o. female with medical history significant for newly diagnosed IgG kappa multiple myeloma who presents for a follow up visit. The patient's last visit was on 04/15/2023.  In the interim since the last visit she has continued on Velcade therapy.   On exam today [name] reports she has been well overall in the interim since her last visit.  Her styes have very minimal residual left and she does still have some watery/mucus drainage from the eyes in the morning.  She reports that she is no longer using any hot compresses.  She reports that she has not had any redness, itching, or rash at the site of her Darzalex injection.  She reports her energy is strong and her appetite is good.  She reports that she has her appetite "all figured out".  She does have diarrhea that last for about 2 days after receiving her shot but no nausea or vomiting.  Overall she feels well and is not having any major side effects as a  result of her chemotherapy treatment..  Otherwise she is having no symptoms.  She is willing and able to continue with chemotherapy at this time. She denies any fevers, chills, sweats, shortness of breath, chest pain or cough.  A full 10 point ROS is otherwise negative.   MEDICAL HISTORY:  Past Medical History:  Diagnosis Date   Anxiety    Arthritis    Asthma    Hx: of   Cancer (HCC)    Hx: of skin cancer   Diverticulosis    GERD (gastroesophageal reflux disease)    Hx: of   History of kidney stones    Hyperlipemia    Hypertension    Pre-diabetes     SURGICAL HISTORY: Past Surgical History:  Procedure Laterality Date   ABDOMINAL SURGERY  09/19/2012   Ruptured Diverticuli, Ostomy Placed.   BREAST BIOPSY  06/06/2012   breast biopsy   CATARACT EXTRACTION W/ INTRAOCULAR LENS IMPLANT Bilateral    COLONOSCOPY  03/06/2012   COLOSTOMY CLOSURE  12/25/2012   Dr Luisa Hart   COLOSTOMY CLOSURE N/A 12/25/2012   Procedure: COLOSTOMY CLOSURE;  Surgeon: Clovis Pu. Cornett, MD;  Location: [facility] OR;  Service: General;  Laterality: N/A;   EXTRACORPOREAL SHOCK WAVE LITHOTRIPSY Left 11/17/2018   Procedure: EXTRACORPOREAL SHOCK WAVE LITHOTRIPSY (ESWL);  Surgeon: Malen Gauze, MD;  Location: [facility] ORS;  Service: Urology;  Laterality: Left;   EYE SURGERY  10/19/1998   Hx: of cyst removed from left eye   KYPHOPLASTY N/A 10/10/2022   Procedure: KYPHOPLASTY Thoracic seven ,  Thoracic ten,Thoracic eleven;  Surgeon: Tressie Stalker, MD;  Location: [facility] OR;  Service: Neurosurgery;  Laterality: N/A;   LAPAROTOMY N/A 09/19/2012   Procedure: EXPLORATORY LAPAROTOMY, sigmoid colectomy;  Surgeon: Clovis Pu. Cornett, MD;  Location: [facility];  Service: General;  Laterality: N/A;   SIGMOIDOSCOPY  01/11/1999   TONSILLECTOMY     TRIGGER FINGER RELEASE  12/25/2010   TUBAL LIGATION  10/16/1985    SOCIAL HISTORY: Social History   Socioeconomic History   Marital status: Widowed    Spouse name: Not on file   Number of  children: Not on file   Years of education: Not on file   Highest education level: Not on file  Occupational History   Not on file  Tobacco Use   Smoking status: Never   Smokeless tobacco: Never  Vaping Use   Vaping status: Never Used  Substance and Sexual Activity   Alcohol use: No   Drug use: No   Sexual activity: Not Currently  Other Topics Concern   Not on file  Social History Narrative   Not on file   Social Determinants of Health   Financial Resource Strain: Not on file  Food Insecurity: Not on file  Transportation Needs: Not on file  Physical Activity: Not on file  Stress: Not on file  Social Connections: Not on file  Intimate Partner Violence: Not on file    FAMILY HISTORY: Family History  Problem Relation Age of Onset   Diabetes Father    Alzheimer's disease Father    Basal cell carcinoma Mother        Metastatic    ALLERGIES:  is allergic to hydrocodone, vioxx [rofecoxib], codeine, doxycycline hyclate, and latex.  MEDICATIONS:  Current Outpatient Medications  Medication Sig Dispense Refill   acyclovir (ZOVIRAX) 400 MG tablet Take 1 tablet (400 mg total) by mouth 2 (two) times daily. 180 tablet 1   ALPRAZolam (XANAX) 0.25 MG tablet Take 0.25 mg by mouth 3 (three) times daily as needed.     amLODipine (NORVASC) 5 MG tablet Take 5 mg by mouth daily.     aspirin 81 MG tablet Take 81 mg by mouth daily.     bacitracin-neomycin-polymyxin-hydrocortisone (CORTISPORIN) 1 % ophthalmic ointment Place 1 Application into the right eye 2 (two) times daily. (Patient taking differently: Place 1 Application into the right eye 2 (two) times daily. Direction on bottle are three times a day for 7 days) 3.5 g 0   cetirizine (ZYRTEC) 10 MG tablet Take 10 mg by mouth at bedtime.     Cholecalciferol (VITAMIN D3) 2000 UNITS capsule Take 2,000 Units by mouth daily.     dexamethasone (DECADRON) 4 MG tablet Take 40 mg (10 tablets) by mouth once a week. 40 tablet 3    dextromethorphan-guaiFENesin (TUSSIN DM) 10-100 MG/5ML liquid Take 10 mLs by mouth every 4 (four) hours as needed for cough.     fluticasone (FLONASE) 50 MCG/ACT nasal spray Place 2 sprays into the nose at bedtime.     hydrOXYzine (ATARAX) 25 MG tablet Take 25 mg by mouth at bedtime.     Probiotic Product (ALIGN PO) Take by mouth daily.     simvastatin (ZOCOR) 20 MG tablet Take 20 mg by mouth every evening.     traMADol (ULTRAM) 50 MG tablet TAKE 1 TO 2 TABLETS BY MOUTH EVERY 4 HOURS AS NEEDED 90 tablet 0   No current facility-administered medications for this visit.    REVIEW OF SYSTEMS:   Constitutional: ( - )  fevers, ( - )  chills , ( - ) night sweats Eyes: ( - ) blurriness of vision, ( - ) double vision, ( - ) watery eyes Ears, nose, mouth, throat, and face: ( - ) mucositis, ( - ) sore throat Respiratory: ( - ) cough, ( - ) dyspnea, ( - ) wheezes Cardiovascular: ( - ) palpitation, ( - ) chest discomfort, ( - ) lower extremity swelling Gastrointestinal:  ( - ) nausea, ( - ) heartburn, ( - ) change in bowel habits Skin: ( - ) abnormal skin rashes Lymphatics: ( - ) new lymphadenopathy, ( - ) easy bruising Neurological: ( - ) numbness, ( - ) tingling, ( - ) new weaknesses Behavioral/Psych: ( - ) mood change, ( - ) new changes  All other systems were reviewed with the patient and are negative.  PHYSICAL EXAMINATION: ECOG PERFORMANCE STATUS: 1 - Symptomatic but completely ambulatory  Vitals:   04/29/23 1008  BP: (!) 142/72  Pulse: 80  Resp: 15  Temp: 98.5 F (36.9 C)  SpO2: 100%   Filed Weights   04/29/23 1008  Weight: 121 lb 12.8 oz (55.2 kg)    GENERAL: Well-appearing elderly Caucasian female, alert, no distress and comfortable SKIN: skin color, texture, turgor are normal, no rashes or significant lesions EYES: conjunctiva are pink and non-injected, sclera clear. Stye on lower eyelid of right eye that is erythematous. No discharge noted.  LUNGS: clear to auscultation and  percussion with normal breathing effort HEART: regular rate & rhythm and no murmurs and no lower extremity edema Musculoskeletal: no cyanosis of digits and no clubbing  PSYCH: alert & oriented x 3, fluent speech NEURO: no focal motor/sensory deficits  LABORATORY DATA:  I have reviewed the data as listed    Latest Ref Rng & Units 04/29/2023    9:40 AM 04/22/2023    2:13 PM 04/15/2023   10:28 AM  CBC  WBC 4.0 - 10.5 K/uL 8.1  7.2  5.7   Hemoglobin 12.0 - 15.0 g/dL 16.1  09.6  04.5   Hematocrit 36.0 - 46.0 % 42.5  42.2  41.4   Platelets 150 - 400 K/uL 240  214  215        Latest Ref Rng & Units 04/29/2023    9:40 AM 04/22/2023    2:13 PM 04/15/2023   10:28 AM  CMP  Glucose 70 - 99 mg/dL 409  811  914   BUN 8 - 23 mg/dL 12  16  15    Creatinine 0.44 - 1.00 mg/dL 7.82  9.56  2.13   Sodium 135 - 145 mmol/L 142  137  139   Potassium 3.5 - 5.1 mmol/L 4.3  4.0  4.2   Chloride 98 - 111 mmol/L 107  107  107   CO2 22 - 32 mmol/L 29  22  26    Calcium 8.9 - 10.3 mg/dL 9.2  8.8  9.0   Total Protein 6.5 - 8.1 g/dL 6.1  6.4  6.1   Total Bilirubin 0.3 - 1.2 mg/dL 0.6  0.4  0.4   Alkaline Phos 38 - 126 U/L 87  83  85   AST 15 - 41 U/L 13  12  12    ALT 0 - 44 U/L 10  9  9      Lab Results  Component Value Date   MPROTEIN 0.1 (H) 04/01/2023   MPROTEIN Not Observed 02/26/2023   MPROTEIN Not Observed 01/28/2023   Lab Results  Component Value  Date   KPAFRELGTCHN 174.6 (H) 04/01/2023   KPAFRELGTCHN 235.6 (H) 02/26/2023   KPAFRELGTCHN 714.1 (H) 01/28/2023   LAMBDASER 2.1 (L) 04/01/2023   LAMBDASER 3.3 (L) 02/26/2023   LAMBDASER 2.9 (L) 01/28/2023   KAPLAMBRATIO 83.14 (H) 04/01/2023   KAPLAMBRATIO 71.39 (H) 02/26/2023   KAPLAMBRATIO 246.24 (H) 01/28/2023    RADIOGRAPHIC STUDIES: No results found.  ASSESSMENT & PLAN LORRAIN DURU 82 y.o. female with medical history significant for IgG kappa multiple myeloma who presents for a follow up visit.   # IgG Kappa Multiple Myeloma #  Plasmacytoma of Spine  -- Patient diagnosed with plasmacytoma of the spine, subsequent bone marrow biopsy confirmed an IgG kappa multiple myeloma involving the bone marrow --start of Velcade/Dex Cycle 1 Day 1 on 12/24/2022. Plan: -- Labs today show creatinine 0.77, white blood cell 8.1, hemoglobin 14.9, MCV 96.4, and platelets of 240 --Due to ocular toxicity, recommend to discontinue Velcade therapy and switch to Daratumumab.  Patient is still recovering from styes. --Last M protein 0.1 on 04/01/2023 with kappa free light chains of 174.6, lambda 2.1, and ratio of 83.14 --Can consider the addition of Revlimid to the patient's treatment regimen at a later time.  Her hemoglobin is strong and her kidney function is good, we should be able to add this shortly. -- Return to clinic in 2 weeks with continued Dara/Dex.   #Ocular toxicity/Stye --Secondary to Velcade therapy --Used Moxifloxacin drops with improvement --Received azithromycin x 5 days.   #Supportive Care -- chemotherapy education complete -- port placement not required   -- zofran 8mg  q8H PRN and compazine 10mg  PO q6H for nausea -- acyclovir 400mg  PO BID for VCZ prophylaxis -- Recieved dental clearance prior to the start of Xgeva/Zometa. First dose on 01/07/2023. Last performed on 04/09/2023. Next dose in Dec 2024.  -- Tramadol 50 mg every 6 hours as needed for pain control  No orders of the defined types were placed in this encounter.  All questions were answered. The patient knows to call the clinic with any problems, questions or concerns.  I have spent a total of 30 minutes minutes of face-to-face and non-face-to-face time, preparing to see the patient, performing a medically appropriate examination, counseling and educating the patient,  documenting clinical information in the electronic health record,and care coordination.   Ulysees Barns, MD Department of Hematology/Oncology [facility] Phone: [phone] Pager: [phone] Email: [email or web address]   04/29/2023 2:20 PM

## 2023-04-29 ENCOUNTER — Inpatient Hospital Stay: Payer: Medicare Other

## 2023-04-29 ENCOUNTER — Inpatient Hospital Stay (HOSPITAL_BASED_OUTPATIENT_CLINIC_OR_DEPARTMENT_OTHER): Payer: Medicare Other | Admitting: Hematology and Oncology

## 2023-04-29 VITALS — BP 142/72 | HR 80 | Temp 98.5°F | Resp 15 | Wt 121.8 lb

## 2023-04-29 DIAGNOSIS — C9000 Multiple myeloma not having achieved remission: Secondary | ICD-10-CM

## 2023-04-29 DIAGNOSIS — Z79899 Other long term (current) drug therapy: Secondary | ICD-10-CM | POA: Diagnosis not present

## 2023-04-29 DIAGNOSIS — Z5112 Encounter for antineoplastic immunotherapy: Secondary | ICD-10-CM | POA: Diagnosis not present

## 2023-04-29 LAB — CMP (CANCER CENTER ONLY)
ALT: 10 U/L (ref 0–44)
AST: 13 U/L — ABNORMAL LOW (ref 15–41)
Albumin: 4.2 g/dL (ref 3.5–5.0)
Alkaline Phosphatase: 87 U/L (ref 38–126)
Anion gap: 6 (ref 5–15)
BUN: 12 mg/dL (ref 8–23)
CO2: 29 mmol/L (ref 22–32)
Calcium: 9.2 mg/dL (ref 8.9–10.3)
Chloride: 107 mmol/L (ref 98–111)
Creatinine: 0.77 mg/dL (ref 0.44–1.00)
GFR, Estimated: >60 mL/min (ref >60)
Glucose, Bld: 122 mg/dL — ABNORMAL HIGH (ref 70–99)
Potassium: 4.3 mmol/L (ref 3.5–5.1)
Sodium: 142 mmol/L (ref 135–145)
Total Bilirubin: 0.6 mg/dL (ref 0.3–1.2)
Total Protein: 6.1 g/dL — ABNORMAL LOW (ref 6.5–8.1)

## 2023-04-29 LAB — CBC WITH DIFFERENTIAL (CANCER CENTER ONLY)
Abs Immature Granulocytes: 0.02 10*3/uL (ref 0.00–0.07)
Basophils Absolute: 0.1 10*3/uL (ref 0.0–0.1)
Basophils Relative: 1 %
Eosinophils Absolute: 0.1 10*3/uL (ref 0.0–0.5)
Eosinophils Relative: 1 %
HCT: 42.5 % (ref 36.0–46.0)
Hemoglobin: 14.9 g/dL (ref 12.0–15.0)
Immature Granulocytes: 0 %
Lymphocytes Relative: 9 %
Lymphs Abs: 0.8 10*3/uL (ref 0.7–4.0)
MCH: 33.8 pg (ref 26.0–34.0)
MCHC: 35.1 g/dL (ref 30.0–36.0)
MCV: 96.4 fL (ref 80.0–100.0)
Monocytes Absolute: 0.4 10*3/uL (ref 0.1–1.0)
Monocytes Relative: 5 %
Neutro Abs: 6.8 10*3/uL (ref 1.7–7.7)
Neutrophils Relative %: 84 %
Platelet Count: 240 10*3/uL (ref 150–400)
RBC: 4.41 MIL/uL (ref 3.87–5.11)
RDW: 12.8 % (ref 11.5–15.5)
WBC Count: 8.1 10*3/uL (ref 4.0–10.5)
nRBC: 0 % (ref 0.0–0.2)

## 2023-04-29 MED ORDER — MONTELUKAST SODIUM 10 MG PO TABS
10.0000 mg | ORAL_TABLET | Freq: Once | ORAL | Status: AC
  Administered 2023-04-29: 10 mg via ORAL
  Filled 2023-04-29: qty 1

## 2023-04-29 MED ORDER — DIPHENHYDRAMINE HCL 25 MG PO CAPS
25.0000 mg | ORAL_CAPSULE | Freq: Once | ORAL | Status: AC
  Administered 2023-04-29: 25 mg via ORAL
  Filled 2023-04-29: qty 1

## 2023-04-29 MED ORDER — DARATUMUMAB-HYALURONIDASE-FIHJ 1800-30000 MG-UT/15ML ~~LOC~~ SOLN
1800.0000 mg | Freq: Once | SUBCUTANEOUS | Status: AC
  Administered 2023-04-29: 1800 mg via SUBCUTANEOUS
  Filled 2023-04-29: qty 15

## 2023-04-29 MED ORDER — ACETAMINOPHEN 325 MG PO TABS
650.0000 mg | ORAL_TABLET | Freq: Once | ORAL | Status: AC
  Administered 2023-04-29: 650 mg via ORAL
  Filled 2023-04-29: qty 2

## 2023-04-29 NOTE — Progress Notes (Signed)
Patient took home decadron at 8am this morning.

## 2023-04-29 NOTE — Patient Instructions (Signed)
[place] [facility]  Discharge Instructions: Thank you for choosing [facility] to provide your oncology and hematology care.   If you have a lab appointment with the Cancer Center, please go directly to the Cancer Center and check in at the registration area.   Wear comfortable clothing and clothing appropriate for easy access to any Portacath or PICC line.   We strive to give you quality time with your provider. You may need to reschedule your appointment if you arrive late (15 or more minutes).  Arriving late affects you and other patients whose appointments are after yours.  Also, if you miss three or more appointments without notifying the office, you may be dismissed from the clinic at the provider's discretion.      For prescription refill requests, have your pharmacy contact our office and allow 72 hours for refills to be completed.    Today you received the following chemotherapy and/or immunotherapy agent: Daratumumab (Darzalex Faspro)   To help prevent nausea and vomiting after your treatment, we encourage you to take your nausea medication as directed.  BELOW ARE SYMPTOMS THAT SHOULD BE REPORTED IMMEDIATELY: *FEVER GREATER THAN 100.4 F (38 C) OR HIGHER *CHILLS OR SWEATING *NAUSEA AND VOMITING THAT IS NOT CONTROLLED WITH YOUR NAUSEA MEDICATION *UNUSUAL SHORTNESS OF BREATH *UNUSUAL BRUISING OR BLEEDING *URINARY PROBLEMS (pain or burning when urinating, or frequent urination) *BOWEL PROBLEMS (unusual diarrhea, constipation, pain near the anus) TENDERNESS IN MOUTH AND THROAT WITH OR WITHOUT PRESENCE OF ULCERS (sore throat, sores in mouth, or a toothache) UNUSUAL RASH, SWELLING OR PAIN  UNUSUAL VAGINAL DISCHARGE OR ITCHING   Items with * indicate a potential emergency and should be followed up as soon as possible or go to the Emergency Department if any problems should occur.  Please show the CHEMOTHERAPY ALERT CARD or IMMUNOTHERAPY  ALERT CARD at check-in to the Emergency Department and triage nurse.  Should you have questions after your visit or need to cancel or reschedule your appointment, please contact [place] [facility]  Dept: [phone]  and follow the prompts.  Office hours are 8:00 a.m. to 4:30 p.m. Monday - Friday. Please note that voicemails left after 4:00 p.m. may not be returned until the following business day.  We are closed weekends and major holidays. You have access to a nurse at all times for urgent questions. Please call the main number to the clinic Dept: [phone] and follow the prompts.   For any non-urgent questions, you may also contact your provider using MyChart. We now offer e-Visits for anyone 58 and older to request care online for non-urgent symptoms. For details visit [email or web address].   Also download the MyChart app! Go to the app store, search "MyChart", open the app, select [facility], and log in with your MyChart username and password.  Daratumumab; Hyaluronidase Injection What is this medication? DARATUMUMAB; HYALURONIDASE (dar a toom ue mab; hye al ur ON i dase) treats multiple myeloma, a type of bone marrow cancer. Daratumumab works by blocking a protein that causes cancer cells to grow and multiply. This helps to slow or stop the spread of cancer cells. Hyaluronidase works by increasing the absorption of other medications in the body to help them work better. This medication may also be used treat amyloidosis, a condition that causes the buildup of a protein (amyloid) in your body. It works by reducing the buildup of this protein, which decreases symptoms. It is a  combination medication that contains a monoclonal antibody. This medicine may be used for other purposes; ask your health care provider or pharmacist if you have questions. COMMON BRAND NAME(S): DARZALEX FASPRO What should I tell my care team before I take this medication? They need to  know if you have any of these conditions: Heart disease Infection, such as chickenpox, cold sores, herpes, hepatitis B Lung or breathing disease An unusual or allergic reaction to daratumumab, hyaluronidase, other medications, foods, dyes, or preservatives Pregnant or trying to get pregnant Breast-feeding How should I use this medication? This medication is injected under the skin. It is given by your care team in a hospital or clinic setting. Talk to your care team about the use of this medication in children. Special care may be needed. Overdosage: If you think you have taken too much of this medicine contact a poison control center or emergency room at once. NOTE: This medicine is only for you. Do not share this medicine with others. What if I miss a dose? Keep appointments for follow-up doses. It is important not to miss your dose. Call your care team if you are unable to keep an appointment. What may interact with this medication? Interactions have not been studied. This list may not describe all possible interactions. Give your health care provider a list of all the medicines, herbs, non-prescription drugs, or dietary supplements you use. Also tell them if you smoke, drink alcohol, or use illegal drugs. Some items may interact with your medicine. What should I watch for while using this medication? Your condition will be monitored carefully while you are receiving this medication. This medication can cause serious allergic reactions. To reduce your risk, your care team may give you other medication to take before receiving this one. Be sure to follow the directions from your care team. This medication can affect the results of blood tests to match your blood type. These changes can last for up to 6 months after the final dose. Your care team will do blood tests to match your blood type before you start treatment. Tell all of your care team that you are being treated with this medication  before receiving a blood transfusion. This medication can affect the results of some tests used to determine treatment response; extra tests may be needed to evaluate response. Talk to your care team if you wish to become pregnant or think you are pregnant. This medication can cause serious birth defects if taken during pregnancy and for 3 months after the last dose. A reliable form of contraception is recommended while taking this medication and for 3 months after the last dose. Talk to your care team about effective forms of contraception. Do not breast-feed while taking this medication. What side effects may I notice from receiving this medication? Side effects that you should report to your care team as soon as possible: Allergic reactions--skin rash, itching, hives, swelling of the face, lips, tongue, or throat Heart rhythm changes--fast or irregular heartbeat, dizziness, feeling faint or lightheaded, chest pain, trouble breathing Infection--fever, chills, cough, sore throat, wounds that don't heal, pain or trouble when passing urine, general feeling of discomfort or being unwell Infusion reactions--chest pain, shortness of breath or trouble breathing, feeling faint or lightheaded Sudden eye pain or change in vision such as blurry vision, seeing halos around lights, vision loss Unusual bruising or bleeding Side effects that usually do not require medical attention (report to your care team if they continue or are bothersome): Constipation  Diarrhea Fatigue Nausea Pain, tingling, or numbness in the hands or feet Swelling of the ankles, hands, or feet This list may not describe all possible side effects. Call your doctor for medical advice about side effects. You may report side effects to FDA at 1-800-FDA-1088. Where should I keep my medication? This medication is given in a hospital or clinic. It will not be stored at home. NOTE: This sheet is a summary. It may not cover all possible  information. If you have questions about this medicine, talk to your doctor, pharmacist, or health care provider.  2024 Elsevier/Gold Standard (2021-11-28 00:00:00)

## 2023-04-30 LAB — KAPPA/LAMBDA LIGHT CHAINS
Kappa free light chain: 191.5 mg/L — ABNORMAL HIGH (ref 3.3–19.4)
Lambda free light chains: <1.5 mg/L — ABNORMAL LOW (ref 5.7–26.3)

## 2023-05-01 DIAGNOSIS — E785 Hyperlipidemia, unspecified: Secondary | ICD-10-CM | POA: Diagnosis not present

## 2023-05-01 DIAGNOSIS — J45909 Unspecified asthma, uncomplicated: Secondary | ICD-10-CM | POA: Diagnosis not present

## 2023-05-01 DIAGNOSIS — M8458XD Pathological fracture in neoplastic disease, other specified site, subsequent encounter for fracture with routine healing: Secondary | ICD-10-CM | POA: Diagnosis not present

## 2023-05-01 DIAGNOSIS — M1990 Unspecified osteoarthritis, unspecified site: Secondary | ICD-10-CM | POA: Diagnosis not present

## 2023-05-01 DIAGNOSIS — C9030 Solitary plasmacytoma not having achieved remission: Secondary | ICD-10-CM | POA: Diagnosis not present

## 2023-05-01 DIAGNOSIS — F419 Anxiety disorder, unspecified: Secondary | ICD-10-CM | POA: Diagnosis not present

## 2023-05-03 LAB — MULTIPLE MYELOMA PANEL, SERUM
Albumin SerPl Elph-Mcnc: 3.6 g/dL (ref 2.9–4.4)
Albumin/Glob SerPl: 1.7 (ref 0.7–1.7)
Alpha 1: 0.3 g/dL (ref 0.0–0.4)
Alpha2 Glob SerPl Elph-Mcnc: 0.8 g/dL (ref 0.4–1.0)
B-Globulin SerPl Elph-Mcnc: 0.9 g/dL (ref 0.7–1.3)
Gamma Glob SerPl Elph-Mcnc: 0.3 g/dL — ABNORMAL LOW (ref 0.4–1.8)
Globulin, Total: 2.2 g/dL (ref 2.2–3.9)
IgA: 7 mg/dL — ABNORMAL LOW (ref 64–422)
IgG (Immunoglobin G), Serum: 382 mg/dL — ABNORMAL LOW (ref 586–1602)
IgM (Immunoglobulin M), Srm: 9 mg/dL — ABNORMAL LOW (ref 26–217)
M Protein SerPl Elph-Mcnc: 0.2 g/dL — ABNORMAL HIGH
Total Protein ELP: 5.8 g/dL — ABNORMAL LOW (ref 6.0–8.5)

## 2023-05-08 DIAGNOSIS — J45909 Unspecified asthma, uncomplicated: Secondary | ICD-10-CM | POA: Diagnosis not present

## 2023-05-08 DIAGNOSIS — M1990 Unspecified osteoarthritis, unspecified site: Secondary | ICD-10-CM | POA: Diagnosis not present

## 2023-05-08 DIAGNOSIS — M8458XD Pathological fracture in neoplastic disease, other specified site, subsequent encounter for fracture with routine healing: Secondary | ICD-10-CM | POA: Diagnosis not present

## 2023-05-08 DIAGNOSIS — E785 Hyperlipidemia, unspecified: Secondary | ICD-10-CM | POA: Diagnosis not present

## 2023-05-08 DIAGNOSIS — F419 Anxiety disorder, unspecified: Secondary | ICD-10-CM | POA: Diagnosis not present

## 2023-05-08 DIAGNOSIS — C9030 Solitary plasmacytoma not having achieved remission: Secondary | ICD-10-CM | POA: Diagnosis not present

## 2023-05-13 ENCOUNTER — Inpatient Hospital Stay: Payer: Medicare Other

## 2023-05-13 ENCOUNTER — Inpatient Hospital Stay (HOSPITAL_BASED_OUTPATIENT_CLINIC_OR_DEPARTMENT_OTHER): Payer: Medicare Other | Admitting: Hematology and Oncology

## 2023-05-13 ENCOUNTER — Inpatient Hospital Stay: Payer: Medicare Other | Attending: Hematology and Oncology

## 2023-05-13 VITALS — BP 133/68 | HR 100 | Temp 98.2°F | Resp 18 | Wt 115.1 lb

## 2023-05-13 DIAGNOSIS — Z5112 Encounter for antineoplastic immunotherapy: Secondary | ICD-10-CM | POA: Insufficient documentation

## 2023-05-13 DIAGNOSIS — T380X5D Adverse effect of glucocorticoids and synthetic analogues, subsequent encounter: Secondary | ICD-10-CM | POA: Diagnosis not present

## 2023-05-13 DIAGNOSIS — E099 Drug or chemical induced diabetes mellitus without complications: Secondary | ICD-10-CM | POA: Diagnosis not present

## 2023-05-13 DIAGNOSIS — G893 Neoplasm related pain (acute) (chronic): Secondary | ICD-10-CM | POA: Diagnosis not present

## 2023-05-13 DIAGNOSIS — C9000 Multiple myeloma not having achieved remission: Secondary | ICD-10-CM | POA: Diagnosis not present

## 2023-05-13 DIAGNOSIS — Z23 Encounter for immunization: Secondary | ICD-10-CM | POA: Diagnosis not present

## 2023-05-13 LAB — CMP (CANCER CENTER ONLY)
ALT: 10 U/L (ref 0–44)
AST: 16 U/L (ref 15–41)
Albumin: 4.4 g/dL (ref 3.5–5.0)
Alkaline Phosphatase: 85 U/L (ref 38–126)
Anion gap: 8 (ref 5–15)
BUN: 13 mg/dL (ref 8–23)
CO2: 24 mmol/L (ref 22–32)
Calcium: 9 mg/dL (ref 8.9–10.3)
Chloride: 107 mmol/L (ref 98–111)
Creatinine: 0.8 mg/dL (ref 0.44–1.00)
GFR, Estimated: >60 mL/min
Glucose, Bld: 255 mg/dL — ABNORMAL HIGH (ref 70–99)
Potassium: 3.9 mmol/L (ref 3.5–5.1)
Sodium: 139 mmol/L (ref 135–145)
Total Bilirubin: 0.6 mg/dL (ref 0.3–1.2)
Total Protein: 6.3 g/dL — ABNORMAL LOW (ref 6.5–8.1)

## 2023-05-13 LAB — CBC WITH DIFFERENTIAL (CANCER CENTER ONLY)
Abs Immature Granulocytes: 0.02 10*3/uL (ref 0.00–0.07)
Basophils Absolute: 0 10*3/uL (ref 0.0–0.1)
Basophils Relative: 0 %
Eosinophils Absolute: 0 10*3/uL (ref 0.0–0.5)
Eosinophils Relative: 0 %
HCT: 40.1 % (ref 36.0–46.0)
Hemoglobin: 14.4 g/dL (ref 12.0–15.0)
Immature Granulocytes: 0 %
Lymphocytes Relative: 3 %
Lymphs Abs: 0.2 10*3/uL — ABNORMAL LOW (ref 0.7–4.0)
MCH: 34.3 pg — ABNORMAL HIGH (ref 26.0–34.0)
MCHC: 35.9 g/dL (ref 30.0–36.0)
MCV: 95.5 fL (ref 80.0–100.0)
Monocytes Absolute: 0 10*3/uL — ABNORMAL LOW (ref 0.1–1.0)
Monocytes Relative: 0 %
Neutro Abs: 7.5 10*3/uL (ref 1.7–7.7)
Neutrophils Relative %: 97 %
Platelet Count: 224 10*3/uL (ref 150–400)
RBC: 4.2 MIL/uL (ref 3.87–5.11)
RDW: 12.9 % (ref 11.5–15.5)
WBC Count: 7.8 10*3/uL (ref 4.0–10.5)
nRBC: 0 % (ref 0.0–0.2)

## 2023-05-13 MED ORDER — MONTELUKAST SODIUM 10 MG PO TABS
10.0000 mg | ORAL_TABLET | Freq: Once | ORAL | Status: AC
  Administered 2023-05-13: 10 mg via ORAL
  Filled 2023-05-13: qty 1

## 2023-05-13 MED ORDER — ACETAMINOPHEN 325 MG PO TABS
650.0000 mg | ORAL_TABLET | Freq: Once | ORAL | Status: AC
  Administered 2023-05-13: 650 mg via ORAL
  Filled 2023-05-13: qty 2

## 2023-05-13 MED ORDER — DIPHENHYDRAMINE HCL 25 MG PO CAPS
25.0000 mg | ORAL_CAPSULE | Freq: Once | ORAL | Status: AC
  Administered 2023-05-13: 25 mg via ORAL
  Filled 2023-05-13: qty 1

## 2023-05-13 MED ORDER — DARATUMUMAB-HYALURONIDASE-FIHJ 1800-30000 MG-UT/15ML ~~LOC~~ SOLN
1800.0000 mg | Freq: Once | SUBCUTANEOUS | Status: AC
  Administered 2023-05-13: 1800 mg via SUBCUTANEOUS
  Filled 2023-05-13: qty 15

## 2023-05-13 NOTE — Progress Notes (Signed)
[facility] [phone]   [phone]  PROGRESS NOTE  Patient Care Team: [name], MD as PCP - General (Family Medicine) Pickenpack-Cousar, [name], NP as Nurse Practitioner (Nurse Practitioner)  Hematological/Oncological History # Plasmacytoma of Thoracic Spine  # IgG Kappa Multiple myeloma 09/22/2022: Patient underwent MRI thoracic spine which showed acute or subacute T7, T10, and T11 compression fractures 10/10/2022: biopsy of compression fracture of T11 reveals a plasmacytoma 11/13/2022: establish care with Dr. Leonides Schanz  12/06/2022: bone marrow biopsy showed plasma cell myeloma involving 80% of the marrow.  12/24/2022: Cycle 1 Day 1 of Velcade/Dex 01/14/2023: Cycle 2 Day 1 of Velcade/Dex 02/04/2023: Cycle 3 Day 1 of Velcade/Dex 02/26/2023: Cycle 4 Day 1 of Velcade/Dex HELD due to ocular toxicity 03/04/2023: Cycle 1 Day 1 of Dara/Dex  04/01/2023: Cycle 2 Day 1 of Dara/Dex  04/29/2023: Cycle 3 Day 1 of Dara/Dex   Interval History:  Alice Anthony 82 y.o. female with medical history significant for newly diagnosed IgG kappa multiple myeloma who presents for a follow up visit. The patient's last visit was on 04/29/2023.  In the interim since the last visit she has continued on Velcade therapy.   On exam today Alice Anthony is accompanied by her daughter today.  She reports over the reduction of the dose and steroids down to 20 mg she has been having difficulty with increased allergies.  She would like to increase back to the 40 mg of Dex weekly.  She reports otherwise she is tolerating the Darzalex well with no other major symptoms.  She continues to have styes in her eyes which do appear to be improving steadily but are still present.  She reports she stopped the ophthalmic antibiotic.  She has her chronic back pain which is stable but otherwise no nausea, vomiting, or diarrhea.  She denies any lightheadedness, dizziness, shortness of breath.  Her appetite is good  and her weight is increased by 3 pounds.  She enjoys cooking sweet potato pies.  She is willing and able to continue with chemotherapy at this time. She denies any fevers, chills, sweats, shortness of breath, chest pain or cough.  A full 10 point ROS is otherwise negative.   MEDICAL HISTORY:  Past Medical History:  Diagnosis Date   Anxiety    Arthritis    Asthma    Hx: of   Cancer (HCC)    Hx: of skin cancer   Diverticulosis    GERD (gastroesophageal reflux disease)    Hx: of   History of kidney stones    Hyperlipemia    Hypertension    Pre-diabetes     SURGICAL HISTORY: Past Surgical History:  Procedure Laterality Date   ABDOMINAL SURGERY  09/19/2012   Ruptured Diverticuli, Ostomy Placed.   BREAST BIOPSY  06/06/2012   breast biopsy   CATARACT EXTRACTION W/ INTRAOCULAR LENS IMPLANT Bilateral    COLONOSCOPY  03/06/2012   COLOSTOMY CLOSURE  12/25/2012   Dr Luisa Hart   COLOSTOMY CLOSURE N/A 12/25/2012   Procedure: COLOSTOMY CLOSURE;  Surgeon: Clovis Pu. Cornett, MD;  Location: [facility] OR;  Service: General;  Laterality: N/A;   EXTRACORPOREAL SHOCK WAVE LITHOTRIPSY Left 11/17/2018   Procedure: EXTRACORPOREAL SHOCK WAVE LITHOTRIPSY (ESWL);  Surgeon: Malen Gauze, MD;  Location: [facility] ORS;  Service: Urology;  Laterality: Left;   EYE SURGERY  10/19/1998   Hx: of cyst removed from left eye   KYPHOPLASTY N/A 10/10/2022   Procedure: KYPHOPLASTY Thoracic seven , Thoracic ten,Thoracic eleven;  Surgeon:  Tressie Stalker, MD;  Location: [facility] OR;  Service: Neurosurgery;  Laterality: N/A;   LAPAROTOMY N/A 09/19/2012   Procedure: EXPLORATORY LAPAROTOMY, sigmoid colectomy;  Surgeon: Clovis Pu. Cornett, MD;  Location: [facility] OR[facility];  Service: General;  Laterality: N/A;   SIGMOIDOSCOPY  01/11/1999   TONSILLECTOMY     TRIGGER FINGER RELEASE  12/25/2010   TUBAL LIGATION  10/16/1985    SOCIAL HISTORY: Social History   Socioeconomic History   Marital status: Widowed    Spouse name: Not on file   Number  of children: Not on file   Years of education: Not on file   Highest education level: Not on file  Occupational History   Not on file  Tobacco Use   Smoking status: Never   Smokeless tobacco: Never  Vaping Use   Vaping status: Never Used  Substance and Sexual Activity   Alcohol use: No   Drug use: No   Sexual activity: Not Currently  Other Topics Concern   Not on file  Social History Narrative   Not on file   Social Determinants of Health   Financial Resource Strain: Not on file  Food Insecurity: Not on file  Transportation Needs: Not on file  Physical Activity: Not on file  Stress: Not on file  Social Connections: Not on file  Intimate Partner Violence: Not on file    FAMILY HISTORY: Family History  Problem Relation Age of Onset   Diabetes Father    Alzheimer's disease Father    Basal cell carcinoma Mother        Metastatic    ALLERGIES:  is allergic to hydrocodone, vioxx [rofecoxib], codeine, doxycycline hyclate, and latex.  MEDICATIONS:  Current Outpatient Medications  Medication Sig Dispense Refill   acyclovir (ZOVIRAX) 400 MG tablet Take 1 tablet (400 mg total) by mouth 2 (two) times daily. 180 tablet 1   ALPRAZolam (XANAX) 0.25 MG tablet Take 0.25 mg by mouth 3 (three) times daily as needed.     amLODipine (NORVASC) 5 MG tablet Take 5 mg by mouth daily.     aspirin 81 MG tablet Take 81 mg by mouth daily.     bacitracin-neomycin-polymyxin-hydrocortisone (CORTISPORIN) 1 % ophthalmic ointment Place 1 Application into the right eye 2 (two) times daily. (Patient taking differently: Place 1 Application into the right eye 2 (two) times daily. Direction on bottle are three times a day for 7 days) 3.5 g 0   cetirizine (ZYRTEC) 10 MG tablet Take 10 mg by mouth at bedtime.     Cholecalciferol (VITAMIN D3) 2000 UNITS capsule Take 2,000 Units by mouth daily.     dexamethasone (DECADRON) 4 MG tablet Take 40 mg (10 tablets) by mouth once a week. 40 tablet 3    dextromethorphan-guaiFENesin (TUSSIN DM) 10-100 MG/5ML liquid Take 10 mLs by mouth every 4 (four) hours as needed for cough.     fluticasone (FLONASE) 50 MCG/ACT nasal spray Place 2 sprays into the nose at bedtime.     hydrOXYzine (ATARAX) 25 MG tablet Take 25 mg by mouth at bedtime.     Probiotic Product (ALIGN PO) Take by mouth daily.     simvastatin (ZOCOR) 20 MG tablet Take 20 mg by mouth every evening.     traMADol (ULTRAM) 50 MG tablet TAKE 1 TO 2 TABLETS BY MOUTH EVERY 4 HOURS AS NEEDED 90 tablet 0   No current facility-administered medications for this visit.    REVIEW OF SYSTEMS:   Constitutional: ( - ) fevers, ( - )  chills , ( - ) night sweats Eyes: ( - ) blurriness of vision, ( - ) double vision, ( - ) watery eyes Ears, nose, mouth, throat, and face: ( - ) mucositis, ( - ) sore throat Respiratory: ( - ) cough, ( - ) dyspnea, ( - ) wheezes Cardiovascular: ( - ) palpitation, ( - ) chest discomfort, ( - ) lower extremity swelling Gastrointestinal:  ( - ) nausea, ( - ) heartburn, ( - ) change in bowel habits Skin: ( - ) abnormal skin rashes Lymphatics: ( - ) new lymphadenopathy, ( - ) easy bruising Neurological: ( - ) numbness, ( - ) tingling, ( - ) new weaknesses Behavioral/Psych: ( - ) mood change, ( - ) new changes  All other systems were reviewed with the patient and are negative.  PHYSICAL EXAMINATION: ECOG PERFORMANCE STATUS: 1 - Symptomatic but completely ambulatory  There were no vitals filed for this visit.  There were no vitals filed for this visit.   GENERAL: Well-appearing elderly Caucasian female, alert, no distress and comfortable SKIN: skin color, texture, turgor are normal, no rashes or significant lesions EYES: conjunctiva are pink and non-injected, sclera clear. Stye on lower eyelid of right eye that is erythematous. No discharge noted.  LUNGS: clear to auscultation and percussion with normal breathing effort HEART: regular rate & rhythm and no murmurs and  no lower extremity edema Musculoskeletal: no cyanosis of digits and no clubbing  PSYCH: alert & oriented x 3, fluent speech NEURO: no focal motor/sensory deficits  LABORATORY DATA:  I have reviewed the data as listed    Latest Ref Rng & Units 05/13/2023    1:37 PM 04/29/2023    9:40 AM 04/22/2023    2:13 PM  CBC  WBC 4.0 - 10.5 K/uL 7.8  8.1  7.2   Hemoglobin 12.0 - 15.0 g/dL 60.4  54.0  98.1   Hematocrit 36.0 - 46.0 % 40.1  42.5  42.2   Platelets 150 - 400 K/uL 224  240  214        Latest Ref Rng & Units 05/13/2023    1:37 PM 04/29/2023    9:40 AM 04/22/2023    2:13 PM  CMP  Glucose 70 - 99 mg/dL 191  478  295   BUN 8 - 23 mg/dL 13  12  16    Creatinine 0.44 - 1.00 mg/dL 6.21  3.08  6.57   Sodium 135 - 145 mmol/L 139  142  137   Potassium 3.5 - 5.1 mmol/L 3.9  4.3  4.0   Chloride 98 - 111 mmol/L 107  107  107   CO2 22 - 32 mmol/L 24  29  22    Calcium 8.9 - 10.3 mg/dL 9.0  9.2  8.8   Total Protein 6.5 - 8.1 g/dL 6.3  6.1  6.4   Total Bilirubin 0.3 - 1.2 mg/dL 0.6  0.6  0.4   Alkaline Phos 38 - 126 U/L 85  87  83   AST 15 - 41 U/L 16  13  12    ALT 0 - 44 U/L 10  10  9      Lab Results  Component Value Date   MPROTEIN 0.2 (H) 04/29/2023   MPROTEIN 0.1 (H) 04/01/2023   MPROTEIN Not Observed 02/26/2023   Lab Results  Component Value Date   KPAFRELGTCHN 191.5 (H) 04/29/2023   KPAFRELGTCHN 174.6 (H) 04/01/2023   KPAFRELGTCHN 235.6 (H) 02/26/2023   LAMBDASER <1.5 (L) 04/29/2023  LAMBDASER 2.1 (L) 04/01/2023   LAMBDASER 3.3 (L) 02/26/2023   KAPLAMBRATIO Note: (A) 04/29/2023   KAPLAMBRATIO 83.14 (H) 04/01/2023   KAPLAMBRATIO 71.39 (H) 02/26/2023   Lab Results  Component Value Date   MPROTEIN 0.2 (H) 04/29/2023   MPROTEIN 0.1 (H) 04/01/2023   MPROTEIN Not Observed 02/26/2023   Lab Results  Component Value Date   KPAFRELGTCHN 191.5 (H) 04/29/2023   KPAFRELGTCHN 174.6 (H) 04/01/2023   KPAFRELGTCHN 235.6 (H) 02/26/2023   LAMBDASER <1.5 (L) 04/29/2023   LAMBDASER 2.1  (L) 04/01/2023   LAMBDASER 3.3 (L) 02/26/2023   KAPLAMBRATIO Note: (A) 04/29/2023   KAPLAMBRATIO 83.14 (H) 04/01/2023   KAPLAMBRATIO 71.39 (H) 02/26/2023     RADIOGRAPHIC STUDIES: No results found.  ASSESSMENT & PLAN BEVAN VU 82 y.o. female with medical history significant for IgG kappa multiple myeloma who presents for a follow up visit.   # IgG Kappa Multiple Myeloma # Plasmacytoma of Spine  -- Patient diagnosed with plasmacytoma of the spine, subsequent bone marrow biopsy confirmed an IgG kappa multiple myeloma involving the bone marrow --start of Velcade/Dex Cycle 1 Day 1 on 12/24/2022. Plan: -- Labs today show creatinine 0.8, white blood cell 7.8, hemoglobin 14.4, MCV 95.5, and platelets of 224 --Due to ocular toxicity, recommend to discontinue Velcade therapy and switch to Daratumumab.  Patient is still recovering from styes. --Last M protein 0.2 on 04/29/2023 with kappa free light chains of  191.5, lambda <1.5, and ratio of unable to calculate.  --Can consider the addition of Revlimid to the patient's treatment regimen at a later time.  Her hemoglobin is strong and her kidney function is good, we should be able to add this shortly. -- Return to clinic in 2 weeks with continued Dara/Dex.   #Ocular toxicity/Stye --Secondary to Velcade therapy --Used Moxifloxacin drops with improvement --Received azithromycin x 5 days.   #Supportive Care -- chemotherapy education complete -- port placement not required   -- zofran 8mg  q8H PRN and compazine 10mg  PO q6H for nausea -- acyclovir 400mg  PO BID for VCZ prophylaxis -- Recieved dental clearance prior to the start of Xgeva/Zometa. First dose on 01/07/2023. Last performed on 04/09/2023. Next dose in Dec 2024.  -- Tramadol 50 mg every 6 hours as needed for pain control  No orders of the defined types were placed in this encounter.  All questions were answered. The patient knows to call the clinic with any problems, questions or  concerns.  I have spent a total of 30 minutes minutes of face-to-face and non-face-to-face time, preparing to see the patient, performing a medically appropriate examination, counseling and educating the patient,  documenting clinical information in the electronic health record,and care coordination.   Ulysees Barns, MD Department of Hematology/Oncology [facility] Phone: [phone] Pager: [phone] Email: [email or web address]   05/13/2023 5:03 PM

## 2023-05-13 NOTE — Progress Notes (Signed)
Patient took 20mg  of decadron at home at 9am.

## 2023-05-13 NOTE — Patient Instructions (Addendum)
[facility]  Discharge Instructions: Thank you for choosing [facility] to provide your oncology and hematology care.   If you have a lab appointment with the Cancer Center, please go directly to the Cancer Center and check in at the registration area.   Wear comfortable clothing and clothing appropriate for easy access to any Portacath or PICC line.   We strive to give you quality time with your provider. You may need to reschedule your appointment if you arrive late (15 or more minutes).  Arriving late affects you and other patients whose appointments are after yours.  Also, if you miss three or more appointments without notifying the office, you may be dismissed from the clinic at the provider's discretion.      For prescription refill requests, have your pharmacy contact our office and allow 72 hours for refills to be completed.    Today you received the following chemotherapy and/or immunotherapy agent: Daratumumab (Darzalex Faspro)   To help prevent nausea and vomiting after your treatment, we encourage you to take your nausea medication as directed.  BELOW ARE SYMPTOMS THAT SHOULD BE REPORTED IMMEDIATELY: *FEVER GREATER THAN 100.4 F (38 C) OR HIGHER *CHILLS OR SWEATING *NAUSEA AND VOMITING THAT IS NOT CONTROLLED WITH YOUR NAUSEA MEDICATION *UNUSUAL SHORTNESS OF BREATH *UNUSUAL BRUISING OR BLEEDING *URINARY PROBLEMS (pain or burning when urinating, or frequent urination) *BOWEL PROBLEMS (unusual diarrhea, constipation, pain near the anus) TENDERNESS IN MOUTH AND THROAT WITH OR WITHOUT PRESENCE OF ULCERS (sore throat, sores in mouth, or a toothache) UNUSUAL RASH, SWELLING OR PAIN  UNUSUAL VAGINAL DISCHARGE OR ITCHING   Items with * indicate a potential emergency and should be followed up as soon as possible or go to the Emergency Department if any problems should occur.  Please show the CHEMOTHERAPY ALERT CARD or IMMUNOTHERAPY  ALERT CARD at check-in to the Emergency Department and triage nurse.  Should you have questions after your visit or need to cancel or reschedule your appointment, please contact [facility]  Dept: [phone]  and follow the prompts.  Office hours are 8:00 a.m. to 4:30 p.m. Monday - Friday. Please note that voicemails left after 4:00 p.m. may not be returned until the following business day.  We are closed weekends and major holidays. You have access to a nurse at all times for urgent questions. Please call the main number to the clinic Dept: [phone] and follow the prompts.   For any non-urgent questions, you may also contact your provider using MyChart. We now offer e-Visits for anyone 74 and older to request care online for non-urgent symptoms. For details visit [email or web address].   Also download the MyChart app! Go to the app store, search "MyChart", open the app, select [facility], and log in with your MyChart username and password.  Daratumumab; Hyaluronidase Injection What is this medication? DARATUMUMAB; HYALURONIDASE (dar a toom ue mab; hye al ur ON i dase) treats multiple myeloma, a type of bone marrow cancer. Daratumumab works by blocking a protein that causes cancer cells to grow and multiply. This helps to slow or stop the spread of cancer cells. Hyaluronidase works by increasing the absorption of other medications in the body to help them work better. This medication may also be used treat amyloidosis, a condition that causes the buildup of a protein (amyloid) in your body. It works by reducing the buildup of this protein, which decreases symptoms. It is a  combination medication that contains a monoclonal antibody. This medicine may be used for other purposes; ask your health care provider or pharmacist if you have questions. COMMON BRAND NAME(S): DARZALEX FASPRO What should I tell my care team before I take this medication? They need to  know if you have any of these conditions: Heart disease Infection, such as chickenpox, cold sores, herpes, hepatitis B Lung or breathing disease An unusual or allergic reaction to daratumumab, hyaluronidase, other medications, foods, dyes, or preservatives Pregnant or trying to get pregnant Breast-feeding How should I use this medication? This medication is injected under the skin. It is given by your care team in a hospital or clinic setting. Talk to your care team about the use of this medication in children. Special care may be needed. Overdosage: If you think you have taken too much of this medicine contact a poison control center or emergency room at once. NOTE: This medicine is only for you. Do not share this medicine with others. What if I miss a dose? Keep appointments for follow-up doses. It is important not to miss your dose. Call your care team if you are unable to keep an appointment. What may interact with this medication? Interactions have not been studied. This list may not describe all possible interactions. Give your health care provider a list of all the medicines, herbs, non-prescription drugs, or dietary supplements you use. Also tell them if you smoke, drink alcohol, or use illegal drugs. Some items may interact with your medicine. What should I watch for while using this medication? Your condition will be monitored carefully while you are receiving this medication. This medication can cause serious allergic reactions. To reduce your risk, your care team may give you other medication to take before receiving this one. Be sure to follow the directions from your care team. This medication can affect the results of blood tests to match your blood type. These changes can last for up to 6 months after the final dose. Your care team will do blood tests to match your blood type before you start treatment. Tell all of your care team that you are being treated with this medication  before receiving a blood transfusion. This medication can affect the results of some tests used to determine treatment response; extra tests may be needed to evaluate response. Talk to your care team if you wish to become pregnant or think you are pregnant. This medication can cause serious birth defects if taken during pregnancy and for 3 months after the last dose. A reliable form of contraception is recommended while taking this medication and for 3 months after the last dose. Talk to your care team about effective forms of contraception. Do not breast-feed while taking this medication. What side effects may I notice from receiving this medication? Side effects that you should report to your care team as soon as possible: Allergic reactions--skin rash, itching, hives, swelling of the face, lips, tongue, or throat Heart rhythm changes--fast or irregular heartbeat, dizziness, feeling faint or lightheaded, chest pain, trouble breathing Infection--fever, chills, cough, sore throat, wounds that don't heal, pain or trouble when passing urine, general feeling of discomfort or being unwell Infusion reactions--chest pain, shortness of breath or trouble breathing, feeling faint or lightheaded Sudden eye pain or change in vision such as blurry vision, seeing halos around lights, vision loss Unusual bruising or bleeding Side effects that usually do not require medical attention (report to your care team if they continue or are bothersome): Constipation  Diarrhea Fatigue Nausea Pain, tingling, or numbness in the hands or feet Swelling of the ankles, hands, or feet This list may not describe all possible side effects. Call your doctor for medical advice about side effects. You may report side effects to FDA at 1-800-FDA-1088. Where should I keep my medication? This medication is given in a hospital or clinic. It will not be stored at home. NOTE: This sheet is a summary. It may not cover all possible  information. If you have questions about this medicine, talk to your doctor, pharmacist, or health care provider.  2024 Elsevier/Gold Standard (2021-11-28 00:00:00)

## 2023-05-16 DIAGNOSIS — M8458XD Pathological fracture in neoplastic disease, other specified site, subsequent encounter for fracture with routine healing: Secondary | ICD-10-CM | POA: Diagnosis not present

## 2023-05-16 DIAGNOSIS — C9030 Solitary plasmacytoma not having achieved remission: Secondary | ICD-10-CM | POA: Diagnosis not present

## 2023-05-16 DIAGNOSIS — J45909 Unspecified asthma, uncomplicated: Secondary | ICD-10-CM | POA: Diagnosis not present

## 2023-05-16 DIAGNOSIS — C9000 Multiple myeloma not having achieved remission: Secondary | ICD-10-CM | POA: Diagnosis not present

## 2023-05-16 DIAGNOSIS — F330 Major depressive disorder, recurrent, mild: Secondary | ICD-10-CM | POA: Diagnosis not present

## 2023-05-16 DIAGNOSIS — E785 Hyperlipidemia, unspecified: Secondary | ICD-10-CM | POA: Diagnosis not present

## 2023-05-16 DIAGNOSIS — F419 Anxiety disorder, unspecified: Secondary | ICD-10-CM | POA: Diagnosis not present

## 2023-05-16 DIAGNOSIS — M1990 Unspecified osteoarthritis, unspecified site: Secondary | ICD-10-CM | POA: Diagnosis not present

## 2023-05-16 DIAGNOSIS — E1122 Type 2 diabetes mellitus with diabetic chronic kidney disease: Secondary | ICD-10-CM | POA: Diagnosis not present

## 2023-05-26 NOTE — Progress Notes (Unsigned)
Patient Care Team: Alice Palmer, MD as PCP - General (Family Medicine) Pickenpack-Cousar, Alice Baumgartner, NP as Nurse Practitioner (Nurse Practitioner)   CHIEF COMPLAINT: Follow up IgG Kappa multiple myeloma   Oncology History  Multiple myeloma (HCC)  12/19/2022 Initial Diagnosis   Multiple myeloma (HCC)   12/24/2022 - 02/18/2023 Chemotherapy   Patient is on Treatment Plan : MYELOMA NON-TRANSPLANT CANDIDATES Vd weekly q21d     03/04/2023 -  Chemotherapy   Patient is on Treatment Plan : MYELOMA RELAPSED REFRACTORY Daratumumab SQ + Lenalidomide + Dexamethasone (DaraRd) q28d        CURRENT THERAPY:   INTERVAL HISTORY Alice Anthony returns for follow up as scheduled. Last seen by Dr. Leonides Schanz 05/13/23 and completed another cycle of Dara/dex.   ROS   Past Medical History:  Diagnosis Date   Anxiety    Arthritis    Asthma    Hx: of   Cancer (HCC)    Hx: of skin cancer   Diverticulosis    GERD (gastroesophageal reflux disease)    Hx: of   History of kidney stones    Hyperlipemia    Hypertension    Pre-diabetes      Past Surgical History:  Procedure Laterality Date   ABDOMINAL SURGERY  09/19/2012   Ruptured Diverticuli, Ostomy Placed.   BREAST BIOPSY  06/06/2012   breast biopsy   CATARACT EXTRACTION W/ INTRAOCULAR LENS IMPLANT Bilateral    COLONOSCOPY  03/06/2012   COLOSTOMY CLOSURE  12/25/2012   Dr Luisa Hart   COLOSTOMY CLOSURE N/A 12/25/2012   Procedure: COLOSTOMY CLOSURE;  Surgeon: Clovis Pu. Cornett, MD;  Location: [facility] OR;  Service: General;  Laterality: N/A;   EXTRACORPOREAL SHOCK WAVE LITHOTRIPSY Left 11/17/2018   Procedure: EXTRACORPOREAL SHOCK WAVE LITHOTRIPSY (ESWL);  Surgeon: Malen Gauze, MD;  Location: [facility];  Service: Urology;  Laterality: Left;   EYE SURGERY  10/19/1998   Hx: of cyst removed from left eye   KYPHOPLASTY N/A 10/10/2022   Procedure: KYPHOPLASTY Thoracic seven , Thoracic ten,Thoracic eleven;  Surgeon: Tressie Stalker, MD;  Location: [facility] OR;   Service: Neurosurgery;  Laterality: N/A;   LAPAROTOMY N/A 09/19/2012   Procedure: EXPLORATORY LAPAROTOMY, sigmoid colectomy;  Surgeon: Clovis Pu. Cornett, MD;  Location: [facility];  Service: General;  Laterality: N/A;   SIGMOIDOSCOPY  01/11/1999   TONSILLECTOMY     TRIGGER FINGER RELEASE  12/25/2010   TUBAL LIGATION  10/16/1985     Outpatient Encounter Medications as of 05/27/2023  Medication Sig Note   acyclovir (ZOVIRAX) 400 MG tablet Take 1 tablet (400 mg total) by mouth 2 (two) times daily.    ALPRAZolam (XANAX) 0.25 MG tablet Take 0.25 mg by mouth 3 (three) times daily as needed.    amLODipine (NORVASC) 5 MG tablet Take 5 mg by mouth daily.    aspirin 81 MG tablet Take 81 mg by mouth daily.    bacitracin-neomycin-polymyxin-hydrocortisone (CORTISPORIN) 1 % ophthalmic ointment Place 1 Application into the right eye 2 (two) times daily. (Patient taking differently: Place 1 Application into the right eye 2 (two) times daily. Direction on bottle are three times a day for 7 days)    cetirizine (ZYRTEC) 10 MG tablet Take 10 mg by mouth at bedtime.    Cholecalciferol (VITAMIN D3) 2000 UNITS capsule Take 2,000 Units by mouth daily.    dexamethasone (DECADRON) 4 MG tablet Take 40 mg (10 tablets) by mouth once a week. 04/29/2023: Taking 5 tablets now instead of 10 tablets   dextromethorphan-guaiFENesin (  TUSSIN DM) 10-100 MG/5ML liquid Take 10 mLs by mouth every 4 (four) hours as needed for cough.    fluticasone (FLONASE) 50 MCG/ACT nasal spray Place 2 sprays into the nose at bedtime.    hydrOXYzine (ATARAX) 25 MG tablet Take 25 mg by mouth at bedtime.    Probiotic Product (ALIGN PO) Take by mouth daily.    simvastatin (ZOCOR) 20 MG tablet Take 20 mg by mouth every evening.    traMADol (ULTRAM) 50 MG tablet TAKE 1 TO 2 TABLETS BY MOUTH EVERY 4 HOURS AS NEEDED    No facility-administered encounter medications on file as of 05/27/2023.     There were no vitals filed for this visit. There is no height  or weight on file to calculate BMI.   PHYSICAL EXAM GENERAL:alert, no distress and comfortable SKIN: no rash  EYES: sclera clear NECK: without mass LYMPH:  no palpable cervical or supraclavicular lymphadenopathy  LUNGS: clear with normal breathing effort HEART: regular rate & rhythm, no lower extremity edema ABDOMEN: abdomen soft, non-tender and normal bowel sounds NEURO: alert & oriented x 3 with fluent speech, no focal motor/sensory deficits Breast exam:  PAC without erythema    CBC    Component Value Date/Time   WBC 7.8 05/13/2023 1337   WBC 5.6 12/06/2022 0722   RBC 4.20 05/13/2023 1337   HGB 14.4 05/13/2023 1337   HCT 40.1 05/13/2023 1337   PLT 224 05/13/2023 1337   MCV 95.5 05/13/2023 1337   MCH 34.3 (H) 05/13/2023 1337   MCHC 35.9 05/13/2023 1337   RDW 12.9 05/13/2023 1337   LYMPHSABS 0.2 (L) 05/13/2023 1337   MONOABS 0.0 (L) 05/13/2023 1337   EOSABS 0.0 05/13/2023 1337   BASOSABS 0.0 05/13/2023 1337     CMP     Component Value Date/Time   NA 139 05/13/2023 1337   K 3.9 05/13/2023 1337   CL 107 05/13/2023 1337   CO2 24 05/13/2023 1337   GLUCOSE 255 (H) 05/13/2023 1337   BUN 13 05/13/2023 1337   CREATININE 0.80 05/13/2023 1337   CALCIUM 9.0 05/13/2023 1337   PROT 6.3 (L) 05/13/2023 1337   ALBUMIN 4.4 05/13/2023 1337   AST 16 05/13/2023 1337   ALT 10 05/13/2023 1337   ALKPHOS 85 05/13/2023 1337   BILITOT 0.6 05/13/2023 1337   GFRNONAA >60 05/13/2023 1337   GFRAA >60 10/20/2018 1719     ASSESSMENT & PLAN:  # Plasmacytoma of Thoracic Spine  # IgG Kappa Multiple myeloma 09/22/2022: Patient underwent MRI thoracic spine which showed acute or subacute T7, T10, and T11 compression fractures 10/10/2022: biopsy of compression fracture of T11 reveals a plasmacytoma 11/13/2022: establish care with Dr. Leonides Schanz  12/06/2022: bone marrow biopsy showed plasma cell myeloma involving 80% of the marrow.  12/24/2022: Cycle 1 Day 1 of Velcade/Dex 01/14/2023: Cycle 2 Day 1 of  Velcade/Dex 02/04/2023: Cycle 3 Day 1 of Velcade/Dex 02/26/2023: Cycle 4 Day 1 of Velcade/Dex HELD due to ocular toxicity 03/04/2023: Cycle 1 Day 1 of Dara/Dex  04/01/2023: Cycle 2 Day 1 of Dara/Dex  04/29/2023: Cycle 3 Day 1 of Dara/Dex  05/13/2023: Cycle 4 Day 1 of Dara/Dex   PLAN:  No orders of the defined types were placed in this encounter.     All questions were answered. The patient knows to call the clinic with any problems, questions or concerns. No barriers to learning were detected. I spent *** counseling the patient face to face. The total time spent in the appointment was ***  and more than 50% was on counseling, review of test results, and coordination of care.   Alice Glad, NP-C @DATE @

## 2023-05-26 NOTE — Progress Notes (Unsigned)
Palliative Medicine [facility]  [phone] [phone]) [phone]   Name: Alice Anthony Date: 05/26/2023 MRN: [id number]  DOB: 1941/01/24  Patient Care Team: [name], MD as PCP - General (Family Medicine) Pickenpack-Cousar, [name], NP as Nurse Practitioner (Nurse Practitioner)    INTERVAL HISTORY: Alice Anthony is a 82 y.o. female with oncologic medical history including recent diagnosis of multiple myeloma (12/2022), DM, and posterior vitreous detachment of left and right eyes. Palliative ask to see for symptom management and goals of care   SOCIAL HISTORY:     reports that she has never smoked. She has never used smokeless tobacco. She reports that she does not drink alcohol and does not use drugs.  ADVANCE DIRECTIVES:  Advanced directives on file  CODE STATUS: Full code  PAST MEDICAL HISTORY: Past Medical History:  Diagnosis Date   Anxiety    Arthritis    Asthma    Hx: of   Cancer (HCC)    Hx: of skin cancer   Diverticulosis    GERD (gastroesophageal reflux disease)    Hx: of   History of kidney stones    Hyperlipemia    Hypertension    Pre-diabetes     ALLERGIES:  is allergic to hydrocodone, vioxx [rofecoxib], codeine, doxycycline hyclate, and latex.  MEDICATIONS:  Current Outpatient Medications  Medication Sig Dispense Refill   acyclovir (ZOVIRAX) 400 MG tablet Take 1 tablet (400 mg total) by mouth 2 (two) times daily. 180 tablet 1   ALPRAZolam (XANAX) 0.25 MG tablet Take 0.25 mg by mouth 3 (three) times daily as needed.     amLODipine (NORVASC) 5 MG tablet Take 5 mg by mouth daily.     aspirin 81 MG tablet Take 81 mg by mouth daily.     bacitracin-neomycin-polymyxin-hydrocortisone (CORTISPORIN) 1 % ophthalmic ointment Place 1 Application into the right eye 2 (two) times daily. (Patient taking differently: Place 1 Application into the right eye 2 (two) times daily. Direction on bottle are three times a day for 7 days)  3.5 g 0   cetirizine (ZYRTEC) 10 MG tablet Take 10 mg by mouth at bedtime.     Cholecalciferol (VITAMIN D3) 2000 UNITS capsule Take 2,000 Units by mouth daily.     dexamethasone (DECADRON) 4 MG tablet Take 40 mg (10 tablets) by mouth once a week. 40 tablet 3   dextromethorphan-guaiFENesin (TUSSIN DM) 10-100 MG/5ML liquid Take 10 mLs by mouth every 4 (four) hours as needed for cough.     fluticasone (FLONASE) 50 MCG/ACT nasal spray Place 2 sprays into the nose at bedtime.     hydrOXYzine (ATARAX) 25 MG tablet Take 25 mg by mouth at bedtime.     Probiotic Product (ALIGN PO) Take by mouth daily.     simvastatin (ZOCOR) 20 MG tablet Take 20 mg by mouth every evening.     traMADol (ULTRAM) 50 MG tablet TAKE 1 TO 2 TABLETS BY MOUTH EVERY 4 HOURS AS NEEDED 90 tablet 0   No current facility-administered medications for this visit.    VITAL SIGNS: There were no vitals taken for this visit. There were no vitals filed for this visit.  Estimated body mass index is 21.75 kg/m as calculated from the following:   Height as of 04/22/23: 5\' 1"  (1.549 m).   Weight as of 05/13/23: 115 lb 1.9 oz (52.2 kg).   PERFORMANCE STATUS (ECOG) : 2 - Symptomatic, <50% confined to bed   Physical Exam General: NAD,  sitting in recliner  Cardiovascular: regular rate and rhythm Pulmonary: normal breathing pattern  Extremities: no edema, no joint deformities Skin: no rashes Neurological: AAO x4  IMPRESSION:   Neoplasm related pain Ms. Mccreedy reports pain is well controlled on current regimen. She is tolerating Tramadol 50-100mg .    We discussed continuing current regimen without changes at this time. Taking medication as needed.   Will continue close follow-up.    Nausea  Controlled   3. Constipation Is better controlled with regimen. Challenging at times.   4. Goals of Care  5/20- We discussed her current illness and what it means in the larger context of her on-going co-morbidities. Natural disease  trajectory and expectations were discussed.   Patient and daughter are realistic in their understanding of current illness. Is remaining hopeful for stability and improve quality of life.   We discussed Her current illness and what it means in the larger context of Her on-going co-morbidities. Natural disease trajectory and expectations were discussed.  I discussed the importance of continued conversation with family and their medical providers regarding overall plan of care and treatment options, ensuring decisions are within the context of the patients values and GOCs.  PLAN:  Tramadol 50-100 mg every 4-6 hours as needed Senna-S 2 pills twice daily Compazine as needed Reglan every 6 hours Ongoing support and symptom management I will plan to see patient back in 3-4 weeks in collaboration to other oncology appointments.    Patient expressed understanding and was in agreement with this plan. She also understands that She can call the clinic at any time with any questions, concerns, or complaints.   Any controlled substances utilized were prescribed in the context of palliative care. PDMP has been reviewed.    Visit consisted of counseling and education dealing with the complex and emotionally intense issues of symptom management and palliative care in the setting of serious and potentially life-threatening illness.Greater than 50%  of this time was spent counseling and coordinating care related to the above assessment and plan.  Willette Alma, AGPCNP-BC  Palliative Medicine Team/[facility]  *Please note that this is a verbal dictation therefore any spelling or grammatical errors are due to the "Dragon Medical One" system interpretation.

## 2023-05-27 ENCOUNTER — Inpatient Hospital Stay (HOSPITAL_BASED_OUTPATIENT_CLINIC_OR_DEPARTMENT_OTHER): Payer: Medicare Other | Admitting: Nurse Practitioner

## 2023-05-27 ENCOUNTER — Encounter: Payer: Self-pay | Admitting: Nurse Practitioner

## 2023-05-27 ENCOUNTER — Inpatient Hospital Stay: Payer: Medicare Other

## 2023-05-27 VITALS — BP 146/75 | HR 84 | Temp 98.2°F | Resp 17 | Ht 61.0 in | Wt 125.1 lb

## 2023-05-27 DIAGNOSIS — R530 Neoplastic (malignant) related fatigue: Secondary | ICD-10-CM

## 2023-05-27 DIAGNOSIS — Z515 Encounter for palliative care: Secondary | ICD-10-CM

## 2023-05-27 DIAGNOSIS — G893 Neoplasm related pain (acute) (chronic): Secondary | ICD-10-CM | POA: Diagnosis not present

## 2023-05-27 DIAGNOSIS — C9000 Multiple myeloma not having achieved remission: Secondary | ICD-10-CM

## 2023-05-27 DIAGNOSIS — K5900 Constipation, unspecified: Secondary | ICD-10-CM

## 2023-05-27 DIAGNOSIS — Z23 Encounter for immunization: Secondary | ICD-10-CM | POA: Diagnosis not present

## 2023-05-27 DIAGNOSIS — Z5112 Encounter for antineoplastic immunotherapy: Secondary | ICD-10-CM | POA: Diagnosis not present

## 2023-05-27 LAB — CBC WITH DIFFERENTIAL (CANCER CENTER ONLY)
Abs Immature Granulocytes: 0.01 10*3/uL (ref 0.00–0.07)
Basophils Absolute: 0.1 10*3/uL (ref 0.0–0.1)
Basophils Relative: 1 %
Eosinophils Absolute: 0.2 10*3/uL (ref 0.0–0.5)
Eosinophils Relative: 3 %
HCT: 40.7 % (ref 36.0–46.0)
Hemoglobin: 14.3 g/dL (ref 12.0–15.0)
Immature Granulocytes: 0 %
Lymphocytes Relative: 17 %
Lymphs Abs: 1.1 10*3/uL (ref 0.7–4.0)
MCH: 34.3 pg — ABNORMAL HIGH (ref 26.0–34.0)
MCHC: 35.1 g/dL (ref 30.0–36.0)
MCV: 97.6 fL (ref 80.0–100.0)
Monocytes Absolute: 0.4 10*3/uL (ref 0.1–1.0)
Monocytes Relative: 6 %
Neutro Abs: 4.5 10*3/uL (ref 1.7–7.7)
Neutrophils Relative %: 73 %
Platelet Count: 215 10*3/uL (ref 150–400)
RBC: 4.17 MIL/uL (ref 3.87–5.11)
RDW: 13.2 % (ref 11.5–15.5)
WBC Count: 6.2 10*3/uL (ref 4.0–10.5)
nRBC: 0 % (ref 0.0–0.2)

## 2023-05-27 LAB — CMP (CANCER CENTER ONLY)
ALT: 9 U/L (ref 0–44)
AST: 15 U/L (ref 15–41)
Albumin: 4.3 g/dL (ref 3.5–5.0)
Alkaline Phosphatase: 67 U/L (ref 38–126)
Anion gap: 6 (ref 5–15)
BUN: 12 mg/dL (ref 8–23)
CO2: 29 mmol/L (ref 22–32)
Calcium: 8.9 mg/dL (ref 8.9–10.3)
Chloride: 107 mmol/L (ref 98–111)
Creatinine: 0.72 mg/dL (ref 0.44–1.00)
GFR, Estimated: >60 mL/min (ref >60)
Glucose, Bld: 154 mg/dL — ABNORMAL HIGH (ref 70–99)
Potassium: 3.7 mmol/L (ref 3.5–5.1)
Sodium: 142 mmol/L (ref 135–145)
Total Bilirubin: 0.5 mg/dL (ref 0.3–1.2)
Total Protein: 5.9 g/dL — ABNORMAL LOW (ref 6.5–8.1)

## 2023-05-27 MED ORDER — DARATUMUMAB-HYALURONIDASE-FIHJ 1800-30000 MG-UT/15ML ~~LOC~~ SOLN
1800.0000 mg | Freq: Once | SUBCUTANEOUS | Status: AC
  Administered 2023-05-27: 1800 mg via SUBCUTANEOUS
  Filled 2023-05-27: qty 15

## 2023-05-27 MED ORDER — ACETAMINOPHEN 325 MG PO TABS
650.0000 mg | ORAL_TABLET | Freq: Once | ORAL | Status: AC
  Administered 2023-05-27: 650 mg via ORAL
  Filled 2023-05-27: qty 2

## 2023-05-27 MED ORDER — INFLUENZA VAC A&B SURF ANT ADJ 0.5 ML IM SUSY
0.5000 mL | PREFILLED_SYRINGE | Freq: Once | INTRAMUSCULAR | Status: AC
  Administered 2023-05-27: 0.5 mL via INTRAMUSCULAR
  Filled 2023-05-27: qty 0.5

## 2023-05-27 MED ORDER — MONTELUKAST SODIUM 10 MG PO TABS
10.0000 mg | ORAL_TABLET | Freq: Once | ORAL | Status: AC
  Administered 2023-05-27: 10 mg via ORAL
  Filled 2023-05-27: qty 1

## 2023-05-27 MED ORDER — DIPHENHYDRAMINE HCL 25 MG PO CAPS
25.0000 mg | ORAL_CAPSULE | Freq: Once | ORAL | Status: AC
  Administered 2023-05-27: 25 mg via ORAL
  Filled 2023-05-27: qty 1

## 2023-05-27 MED ORDER — TRAMADOL HCL 50 MG PO TABS: 50.0000 mg | ORAL_TABLET | ORAL | 0 refills | Status: DC | PRN

## 2023-05-27 NOTE — Patient Instructions (Signed)

## 2023-05-27 NOTE — Patient Instructions (Signed)
[place] [facility]  Discharge Instructions: Thank you for choosing [facility] to provide your oncology and hematology care.   If you have a lab appointment with the Cancer Center, please go directly to the Cancer Center and check in at the registration area.   Wear comfortable clothing and clothing appropriate for easy access to any Portacath or PICC line.   We strive to give you quality time with your provider. You may need to reschedule your appointment if you arrive late (15 or more minutes).  Arriving late affects you and other patients whose appointments are after yours.  Also, if you miss three or more appointments without notifying the office, you may be dismissed from the clinic at the provider's discretion.      For prescription refill requests, have your pharmacy contact our office and allow 72 hours for refills to be completed.    Today you received the following chemotherapy and/or immunotherapy agent: Daratumumab (Darzalex Faspro)   To help prevent nausea and vomiting after your treatment, we encourage you to take your nausea medication as directed.  BELOW ARE SYMPTOMS THAT SHOULD BE REPORTED IMMEDIATELY: *FEVER GREATER THAN 100.4 F (38 C) OR HIGHER *CHILLS OR SWEATING *NAUSEA AND VOMITING THAT IS NOT CONTROLLED WITH YOUR NAUSEA MEDICATION *UNUSUAL SHORTNESS OF BREATH *UNUSUAL BRUISING OR BLEEDING *URINARY PROBLEMS (pain or burning when urinating, or frequent urination) *BOWEL PROBLEMS (unusual diarrhea, constipation, pain near the anus) TENDERNESS IN MOUTH AND THROAT WITH OR WITHOUT PRESENCE OF ULCERS (sore throat, sores in mouth, or a toothache) UNUSUAL RASH, SWELLING OR PAIN  UNUSUAL VAGINAL DISCHARGE OR ITCHING   Items with * indicate a potential emergency and should be followed up as soon as possible or go to the Emergency Department if any problems should occur.  Please show the CHEMOTHERAPY ALERT CARD or IMMUNOTHERAPY  ALERT CARD at check-in to the Emergency Department and triage nurse.  Should you have questions after your visit or need to cancel or reschedule your appointment, please contact [place] [facility]  Dept: [phone]  and follow the prompts.  Office hours are 8:00 a.m. to 4:30 p.m. Monday - Friday. Please note that voicemails left after 4:00 p.m. may not be returned until the following business day.  We are closed weekends and major holidays. You have access to a nurse at all times for urgent questions. Please call the main number to the clinic Dept: [phone] and follow the prompts.   For any non-urgent questions, you may also contact your provider using MyChart. We now offer e-Visits for anyone 74 and older to request care online for non-urgent symptoms. For details visit [email or web address].   Also download the MyChart app! Go to the app store, search "MyChart", open the app, select [place], and log in with your MyChart username and password.  Daratumumab; Hyaluronidase Injection What is this medication? DARATUMUMAB; HYALURONIDASE (dar a toom ue mab; hye al ur ON i dase) treats multiple myeloma, a type of bone marrow cancer. Daratumumab works by blocking a protein that causes cancer cells to grow and multiply. This helps to slow or stop the spread of cancer cells. Hyaluronidase works by increasing the absorption of other medications in the body to help them work better. This medication may also be used treat amyloidosis, a condition that causes the buildup of a protein (amyloid) in your body. It works by reducing the buildup of this protein, which decreases symptoms. It is a  combination medication that contains a monoclonal antibody. This medicine may be used for other purposes; ask your health care provider or pharmacist if you have questions. COMMON BRAND NAME(S): DARZALEX FASPRO What should I tell my care team before I take this medication? They need to  know if you have any of these conditions: Heart disease Infection, such as chickenpox, cold sores, herpes, hepatitis B Lung or breathing disease An unusual or allergic reaction to daratumumab, hyaluronidase, other medications, foods, dyes, or preservatives Pregnant or trying to get pregnant Breast-feeding How should I use this medication? This medication is injected under the skin. It is given by your care team in a hospital or clinic setting. Talk to your care team about the use of this medication in children. Special care may be needed. Overdosage: If you think you have taken too much of this medicine contact a poison control center or emergency room at once. NOTE: This medicine is only for you. Do not share this medicine with others. What if I miss a dose? Keep appointments for follow-up doses. It is important not to miss your dose. Call your care team if you are unable to keep an appointment. What may interact with this medication? Interactions have not been studied. This list may not describe all possible interactions. Give your health care provider a list of all the medicines, herbs, non-prescription drugs, or dietary supplements you use. Also tell them if you smoke, drink alcohol, or use illegal drugs. Some items may interact with your medicine. What should I watch for while using this medication? Your condition will be monitored carefully while you are receiving this medication. This medication can cause serious allergic reactions. To reduce your risk, your care team may give you other medication to take before receiving this one. Be sure to follow the directions from your care team. This medication can affect the results of blood tests to match your blood type. These changes can last for up to 6 months after the final dose. Your care team will do blood tests to match your blood type before you start treatment. Tell all of your care team that you are being treated with this medication  before receiving a blood transfusion. This medication can affect the results of some tests used to determine treatment response; extra tests may be needed to evaluate response. Talk to your care team if you wish to become pregnant or think you are pregnant. This medication can cause serious birth defects if taken during pregnancy and for 3 months after the last dose. A reliable form of contraception is recommended while taking this medication and for 3 months after the last dose. Talk to your care team about effective forms of contraception. Do not breast-feed while taking this medication. What side effects may I notice from receiving this medication? Side effects that you should report to your care team as soon as possible: Allergic reactions--skin rash, itching, hives, swelling of the face, lips, tongue, or throat Heart rhythm changes--fast or irregular heartbeat, dizziness, feeling faint or lightheaded, chest pain, trouble breathing Infection--fever, chills, cough, sore throat, wounds that don't heal, pain or trouble when passing urine, general feeling of discomfort or being unwell Infusion reactions--chest pain, shortness of breath or trouble breathing, feeling faint or lightheaded Sudden eye pain or change in vision such as blurry vision, seeing halos around lights, vision loss Unusual bruising or bleeding Side effects that usually do not require medical attention (report to your care team if they continue or are bothersome): Constipation  Diarrhea Fatigue Nausea Pain, tingling, or numbness in the hands or feet Swelling of the ankles, hands, or feet This list may not describe all possible side effects. Call your doctor for medical advice about side effects. You may report side effects to FDA at 1-800-FDA-1088. Where should I keep my medication? This medication is given in a hospital or clinic. It will not be stored at home. NOTE: This sheet is a summary. It may not cover all possible  information. If you have questions about this medicine, talk to your doctor, pharmacist, or health care provider.  2024 Elsevier/Gold Standard (2021-11-28 00:00:00)

## 2023-05-28 ENCOUNTER — Encounter: Payer: Self-pay | Admitting: Hematology and Oncology

## 2023-05-28 LAB — KAPPA/LAMBDA LIGHT CHAINS
Kappa free light chain: 153.3 mg/L — ABNORMAL HIGH (ref 3.3–19.4)
Kappa, lambda light chain ratio: 58.96 — ABNORMAL HIGH (ref 0.26–1.65)
Lambda free light chains: 2.6 mg/L — ABNORMAL LOW (ref 5.7–26.3)

## 2023-05-30 LAB — MULTIPLE MYELOMA PANEL, SERUM
Albumin SerPl Elph-Mcnc: 4 g/dL (ref 2.9–4.4)
Albumin/Glob SerPl: 2.1 — ABNORMAL HIGH (ref 0.7–1.7)
Alpha 1: 0.2 g/dL (ref 0.0–0.4)
Alpha2 Glob SerPl Elph-Mcnc: 0.7 g/dL (ref 0.4–1.0)
B-Globulin SerPl Elph-Mcnc: 0.8 g/dL (ref 0.7–1.3)
Gamma Glob SerPl Elph-Mcnc: 0.3 g/dL — ABNORMAL LOW (ref 0.4–1.8)
Globulin, Total: 2 g/dL — ABNORMAL LOW (ref 2.2–3.9)
IgA: 8 mg/dL — ABNORMAL LOW (ref 64–422)
IgG (Immunoglobin G), Serum: 342 mg/dL — ABNORMAL LOW (ref 586–1602)
IgM (Immunoglobulin M), Srm: 12 mg/dL — ABNORMAL LOW (ref 26–217)
M Protein SerPl Elph-Mcnc: 0.2 g/dL — ABNORMAL HIGH
Total Protein ELP: 6 g/dL (ref 6.0–8.5)

## 2023-06-10 ENCOUNTER — Other Ambulatory Visit (HOSPITAL_COMMUNITY): Payer: Self-pay

## 2023-06-10 ENCOUNTER — Telehealth: Payer: Self-pay | Admitting: Pharmacy Technician

## 2023-06-10 ENCOUNTER — Inpatient Hospital Stay: Payer: Medicare Other

## 2023-06-10 ENCOUNTER — Other Ambulatory Visit: Payer: Self-pay | Admitting: *Deleted

## 2023-06-10 ENCOUNTER — Inpatient Hospital Stay: Payer: Medicare Other | Attending: Hematology and Oncology

## 2023-06-10 ENCOUNTER — Inpatient Hospital Stay (HOSPITAL_BASED_OUTPATIENT_CLINIC_OR_DEPARTMENT_OTHER): Payer: Medicare Other | Admitting: Hematology and Oncology

## 2023-06-10 ENCOUNTER — Encounter: Payer: Self-pay | Admitting: Hematology and Oncology

## 2023-06-10 ENCOUNTER — Telehealth: Payer: Self-pay | Admitting: Pharmacist

## 2023-06-10 VITALS — BP 139/80 | HR 76 | Temp 97.8°F | Resp 14 | Wt 125.6 lb

## 2023-06-10 DIAGNOSIS — Z5112 Encounter for antineoplastic immunotherapy: Secondary | ICD-10-CM | POA: Diagnosis not present

## 2023-06-10 DIAGNOSIS — Z79899 Other long term (current) drug therapy: Secondary | ICD-10-CM | POA: Insufficient documentation

## 2023-06-10 DIAGNOSIS — C9000 Multiple myeloma not having achieved remission: Secondary | ICD-10-CM | POA: Insufficient documentation

## 2023-06-10 LAB — CBC WITH DIFFERENTIAL (CANCER CENTER ONLY)
Abs Immature Granulocytes: 0.02 10*3/uL (ref 0.00–0.07)
Basophils Absolute: 0.1 10*3/uL (ref 0.0–0.1)
Basophils Relative: 1 %
Eosinophils Absolute: 0.1 10*3/uL (ref 0.0–0.5)
Eosinophils Relative: 1 %
HCT: 40.3 % (ref 36.0–46.0)
Hemoglobin: 14.2 g/dL (ref 12.0–15.0)
Immature Granulocytes: 0 %
Lymphocytes Relative: 6 %
Lymphs Abs: 0.4 10*3/uL — ABNORMAL LOW (ref 0.7–4.0)
MCH: 34.1 pg — ABNORMAL HIGH (ref 26.0–34.0)
MCHC: 35.2 g/dL (ref 30.0–36.0)
MCV: 96.6 fL (ref 80.0–100.0)
Monocytes Absolute: 0.2 10*3/uL (ref 0.1–1.0)
Monocytes Relative: 2 %
Neutro Abs: 6.1 10*3/uL (ref 1.7–7.7)
Neutrophils Relative %: 90 %
Platelet Count: 199 10*3/uL (ref 150–400)
RBC: 4.17 MIL/uL (ref 3.87–5.11)
RDW: 13.1 % (ref 11.5–15.5)
WBC Count: 6.8 10*3/uL (ref 4.0–10.5)
nRBC: 0 % (ref 0.0–0.2)

## 2023-06-10 LAB — CMP (CANCER CENTER ONLY)
ALT: 9 U/L (ref 0–44)
AST: 15 U/L (ref 15–41)
Albumin: 4.2 g/dL (ref 3.5–5.0)
Alkaline Phosphatase: 61 U/L (ref 38–126)
Anion gap: 5 (ref 5–15)
BUN: 12 mg/dL (ref 8–23)
CO2: 28 mmol/L (ref 22–32)
Calcium: 8.9 mg/dL (ref 8.9–10.3)
Chloride: 106 mmol/L (ref 98–111)
Creatinine: 0.7 mg/dL (ref 0.44–1.00)
GFR, Estimated: >60 mL/min (ref >60)
Glucose, Bld: 163 mg/dL — ABNORMAL HIGH (ref 70–99)
Potassium: 4 mmol/L (ref 3.5–5.1)
Sodium: 139 mmol/L (ref 135–145)
Total Bilirubin: 0.6 mg/dL (ref <1.2)
Total Protein: 6.2 g/dL — ABNORMAL LOW (ref 6.5–8.1)

## 2023-06-10 MED ORDER — DEXAMETHASONE 4 MG PO TABS: ORAL_TABLET | ORAL | 3 refills | Status: DC

## 2023-06-10 MED ORDER — DARATUMUMAB-HYALURONIDASE-FIHJ 1800-30000 MG-UT/15ML ~~LOC~~ SOLN
1800.0000 mg | Freq: Once | SUBCUTANEOUS | Status: AC
  Administered 2023-06-10: 1800 mg via SUBCUTANEOUS
  Filled 2023-06-10: qty 15

## 2023-06-10 MED ORDER — BACITRA-NEOMYCIN-POLYMYXIN-HC 1 % OP OINT: 1.0000 | TOPICAL_OINTMENT | Freq: Two times a day (BID) | OPHTHALMIC | 0 refills | Status: DC

## 2023-06-10 MED ORDER — LENALIDOMIDE 10 MG PO CAPS: 10.0000 mg | ORAL_CAPSULE | Freq: Every day | ORAL | 0 refills | Status: DC

## 2023-06-10 MED ORDER — DIPHENHYDRAMINE HCL 25 MG PO CAPS
25.0000 mg | ORAL_CAPSULE | Freq: Once | ORAL | Status: AC
Start: 2023-06-10 — End: 2023-06-10
  Administered 2023-06-10: 25 mg via ORAL
  Filled 2023-06-10: qty 1

## 2023-06-10 MED ORDER — ACETAMINOPHEN 325 MG PO TABS
650.0000 mg | ORAL_TABLET | Freq: Once | ORAL | Status: AC
  Administered 2023-06-10: 650 mg via ORAL
  Filled 2023-06-10: qty 2

## 2023-06-10 MED ORDER — MONTELUKAST SODIUM 10 MG PO TABS
10.0000 mg | ORAL_TABLET | Freq: Once | ORAL | Status: AC
  Administered 2023-06-10: 10 mg via ORAL
  Filled 2023-06-10: qty 1

## 2023-06-10 NOTE — Telephone Encounter (Signed)
Oral Oncology Patient Advocate Encounter  Prior Authorization for lenalidomide has been approved.    PA# [id number] Effective dates: 06/10/23 through 06/09/24  Patients co-pay is $4,037.33.    Jinger Neighbors, CPhT-Adv Oncology Pharmacy Patient Advocate [facility] Direct Number: [phone]  Fax: [phone]

## 2023-06-10 NOTE — Telephone Encounter (Signed)
Oral Oncology Patient Advocate Encounter  Was successful in securing patient a $12,000 grant from [facility] to provide ,copayment coverage for lenalidomide.  This will keep the out of pocket expense at $0.     [employer] ID: [id number]   The billing information is as follows and has been shared with [facility].    RxBin: [id number] PCN: [id number] Member ID: [id number] Group ID: [id number] Dates of Eligibility: 05/11/23 through 05/09/24  Fund:  MM  Jinger Neighbors, CPhT-Adv Oncology Pharmacy Patient Advocate [facility] Direct Number: [phone]  Fax: [phone]

## 2023-06-10 NOTE — Telephone Encounter (Signed)
Oral Oncology Patient Advocate Encounter   Received notification that prior authorization for lenalidomide is required.   PA submitted on 06/10/23 Key [id number] Status is pending     Jinger Neighbors, CPhT-Adv Oncology Pharmacy Patient Advocate [facility] Direct Number: [phone]  Fax: [phone]

## 2023-06-10 NOTE — Patient Instructions (Signed)
[place] [facility]  Discharge Instructions: Thank you for choosing [facility] to provide your oncology and hematology care.   If you have a lab appointment with the Cancer Center, please go directly to the Cancer Center and check in at the registration area.   Wear comfortable clothing and clothing appropriate for easy access to any Portacath or PICC line.   We strive to give you quality time with your provider. You may need to reschedule your appointment if you arrive late (15 or more minutes).  Arriving late affects you and other patients whose appointments are after yours.  Also, if you miss three or more appointments without notifying the office, you may be dismissed from the clinic at the provider's discretion.      For prescription refill requests, have your pharmacy contact our office and allow 72 hours for refills to be completed.    Today you received the following chemotherapy and/or immunotherapy agent: Daratumumab (Darzalex Faspro)   To help prevent nausea and vomiting after your treatment, we encourage you to take your nausea medication as directed.  BELOW ARE SYMPTOMS THAT SHOULD BE REPORTED IMMEDIATELY: *FEVER GREATER THAN 100.4 F (38 C) OR HIGHER *CHILLS OR SWEATING *NAUSEA AND VOMITING THAT IS NOT CONTROLLED WITH YOUR NAUSEA MEDICATION *UNUSUAL SHORTNESS OF BREATH *UNUSUAL BRUISING OR BLEEDING *URINARY PROBLEMS (pain or burning when urinating, or frequent urination) *BOWEL PROBLEMS (unusual diarrhea, constipation, pain near the anus) TENDERNESS IN MOUTH AND THROAT WITH OR WITHOUT PRESENCE OF ULCERS (sore throat, sores in mouth, or a toothache) UNUSUAL RASH, SWELLING OR PAIN  UNUSUAL VAGINAL DISCHARGE OR ITCHING   Items with * indicate a potential emergency and should be followed up as soon as possible or go to the Emergency Department if any problems should occur.  Please show the CHEMOTHERAPY ALERT CARD or IMMUNOTHERAPY  ALERT CARD at check-in to the Emergency Department and triage nurse.  Should you have questions after your visit or need to cancel or reschedule your appointment, please contact [place] [facility]  Dept: [phone]  and follow the prompts.  Office hours are 8:00 a.m. to 4:30 p.m. Monday - Friday. Please note that voicemails left after 4:00 p.m. may not be returned until the following business day.  We are closed weekends and major holidays. You have access to a nurse at all times for urgent questions. Please call the main number to the clinic Dept: [phone] and follow the prompts.   For any non-urgent questions, you may also contact your provider using MyChart. We now offer e-Visits for anyone 74 and older to request care online for non-urgent symptoms. For details visit [email or web address].   Also download the MyChart app! Go to the app store, search "MyChart", open the app, select [place], and log in with your MyChart username and password.  Daratumumab; Hyaluronidase Injection What is this medication? DARATUMUMAB; HYALURONIDASE (dar a toom ue mab; hye al ur ON i dase) treats multiple myeloma, a type of bone marrow cancer. Daratumumab works by blocking a protein that causes cancer cells to grow and multiply. This helps to slow or stop the spread of cancer cells. Hyaluronidase works by increasing the absorption of other medications in the body to help them work better. This medication may also be used treat amyloidosis, a condition that causes the buildup of a protein (amyloid) in your body. It works by reducing the buildup of this protein, which decreases symptoms. It is a  combination medication that contains a monoclonal antibody. This medicine may be used for other purposes; ask your health care provider or pharmacist if you have questions. COMMON BRAND NAME(S): DARZALEX FASPRO What should I tell my care team before I take this medication? They need to  know if you have any of these conditions: Heart disease Infection, such as chickenpox, cold sores, herpes, hepatitis B Lung or breathing disease An unusual or allergic reaction to daratumumab, hyaluronidase, other medications, foods, dyes, or preservatives Pregnant or trying to get pregnant Breast-feeding How should I use this medication? This medication is injected under the skin. It is given by your care team in a hospital or clinic setting. Talk to your care team about the use of this medication in children. Special care may be needed. Overdosage: If you think you have taken too much of this medicine contact a poison control center or emergency room at once. NOTE: This medicine is only for you. Do not share this medicine with others. What if I miss a dose? Keep appointments for follow-up doses. It is important not to miss your dose. Call your care team if you are unable to keep an appointment. What may interact with this medication? Interactions have not been studied. This list may not describe all possible interactions. Give your health care provider a list of all the medicines, herbs, non-prescription drugs, or dietary supplements you use. Also tell them if you smoke, drink alcohol, or use illegal drugs. Some items may interact with your medicine. What should I watch for while using this medication? Your condition will be monitored carefully while you are receiving this medication. This medication can cause serious allergic reactions. To reduce your risk, your care team may give you other medication to take before receiving this one. Be sure to follow the directions from your care team. This medication can affect the results of blood tests to match your blood type. These changes can last for up to 6 months after the final dose. Your care team will do blood tests to match your blood type before you start treatment. Tell all of your care team that you are being treated with this medication  before receiving a blood transfusion. This medication can affect the results of some tests used to determine treatment response; extra tests may be needed to evaluate response. Talk to your care team if you wish to become pregnant or think you are pregnant. This medication can cause serious birth defects if taken during pregnancy and for 3 months after the last dose. A reliable form of contraception is recommended while taking this medication and for 3 months after the last dose. Talk to your care team about effective forms of contraception. Do not breast-feed while taking this medication. What side effects may I notice from receiving this medication? Side effects that you should report to your care team as soon as possible: Allergic reactions--skin rash, itching, hives, swelling of the face, lips, tongue, or throat Heart rhythm changes--fast or irregular heartbeat, dizziness, feeling faint or lightheaded, chest pain, trouble breathing Infection--fever, chills, cough, sore throat, wounds that don't heal, pain or trouble when passing urine, general feeling of discomfort or being unwell Infusion reactions--chest pain, shortness of breath or trouble breathing, feeling faint or lightheaded Sudden eye pain or change in vision such as blurry vision, seeing halos around lights, vision loss Unusual bruising or bleeding Side effects that usually do not require medical attention (report to your care team if they continue or are bothersome): Constipation  Diarrhea Fatigue Nausea Pain, tingling, or numbness in the hands or feet Swelling of the ankles, hands, or feet This list may not describe all possible side effects. Call your doctor for medical advice about side effects. You may report side effects to FDA at 1-800-FDA-1088. Where should I keep my medication? This medication is given in a hospital or clinic. It will not be stored at home. NOTE: This sheet is a summary. It may not cover all possible  information. If you have questions about this medicine, talk to your doctor, pharmacist, or health care provider.  2024 Elsevier/Gold Standard (2021-11-28 00:00:00)

## 2023-06-10 NOTE — Progress Notes (Unsigned)
[facility] [phone]   Fax:([phone]  PROGRESS NOTE  Patient Care Team: [name], MD as PCP - General (Family Medicine) Pickenpack-Cousar, [name], NP as Nurse Practitioner (Nurse Practitioner)  Hematological/Oncological History # Plasmacytoma of Thoracic Spine  # IgG Kappa Multiple myeloma 09/22/2022: Patient underwent MRI thoracic spine which showed acute or subacute T7, T10, and T11 compression fractures 10/10/2022: biopsy of compression fracture of T11 reveals a plasmacytoma 11/13/2022: establish care with Dr. Leonides Schanz  12/06/2022: bone marrow biopsy showed plasma cell myeloma involving 80% of the marrow.  12/24/2022: Cycle 1 Day 1 of Velcade/Dex 01/14/2023: Cycle 2 Day 1 of Velcade/Dex 02/04/2023: Cycle 3 Day 1 of Velcade/Dex 02/26/2023: Cycle 4 Day 1 of Velcade/Dex HELD due to ocular toxicity 03/04/2023: Cycle 1 Day 1 of Dara/Dex  04/01/2023: Cycle 2 Day 1 of Dara/Dex  04/29/2023: Cycle 3 Day 1 of Dara/Dex  05/27/2023: Cycle 4 Day 1 of Dara/Dex   Interval History:  Alice Anthony 82 y.o. female with medical history significant for newly diagnosed IgG kappa multiple myeloma who presents for a follow up visit. The patient's last visit was on 05/27/2023.  In the interim since the last visit she has continued on Velcade therapy.   On exam today Alice Anthony is accompanied by her daughter today.  She reports she continues to have some inflammation in her left eyelid.  She reports that she has not been doing hot compresses as faithfully but has been continue to use the antibiotic drops.  She reports that she is not having any other problems with the Darzalex therapy.  She reports that she is not having any nausea or vomiting but does have some occasional loose stools.  She reports that she did have a more difficult reaction after her most recent flu shot which is atypical for her.  She reports for about 2 hours after the shot "every bone in my body hurt".  She  notes her appetite has been "so-so" but her energy levels have been good.  Overall she is willing and able to continue with chemotherapy at this time. She denies any fevers, chills, sweats, shortness of breath, chest pain or cough.  A full 10 point ROS is otherwise negative.   MEDICAL HISTORY:  Past Medical History:  Diagnosis Date   Anxiety    Arthritis    Asthma    Hx: of   Cancer (HCC)    Hx: of skin cancer   Diverticulosis    GERD (gastroesophageal reflux disease)    Hx: of   History of kidney stones    Hyperlipemia    Hypertension    Pre-diabetes     SURGICAL HISTORY: Past Surgical History:  Procedure Laterality Date   ABDOMINAL SURGERY  09/19/2012   Ruptured Diverticuli, Ostomy Placed.   BREAST BIOPSY  06/06/2012   breast biopsy   CATARACT EXTRACTION W/ INTRAOCULAR LENS IMPLANT Bilateral    COLONOSCOPY  03/06/2012   COLOSTOMY CLOSURE  12/25/2012   Dr Luisa Hart   COLOSTOMY CLOSURE N/A 12/25/2012   Procedure: COLOSTOMY CLOSURE;  Surgeon: Clovis Pu. Cornett, MD;  Location: [facility] OR;  Service: General;  Laterality: N/A;   EXTRACORPOREAL SHOCK WAVE LITHOTRIPSY Left 11/17/2018   Procedure: EXTRACORPOREAL SHOCK WAVE LITHOTRIPSY (ESWL);  Surgeon: Malen Gauze, MD;  Location: [facility] ORS;  Service: Urology;  Laterality: Left;   EYE SURGERY  10/19/1998   Hx: of cyst removed from left eye   KYPHOPLASTY N/A 10/10/2022   Procedure: KYPHOPLASTY Thoracic seven ,  Thoracic ten,Thoracic eleven;  Surgeon: Tressie Stalker, MD;  Location: [facility] OR;  Service: Neurosurgery;  Laterality: N/A;   LAPAROTOMY N/A 09/19/2012   Procedure: EXPLORATORY LAPAROTOMY, sigmoid colectomy;  Surgeon: Clovis Pu. Cornett, MD;  Location: [facility]S;  Service: General;  Laterality: N/A;   SIGMOIDOSCOPY  01/11/1999   TONSILLECTOMY     TRIGGER FINGER RELEASE  12/25/2010   TUBAL LIGATION  10/16/1985    SOCIAL HISTORY: Social History   Socioeconomic History   Marital status: Widowed    Spouse name: Not on file    Number of children: Not on file   Years of education: Not on file   Highest education level: Not on file  Occupational History   Not on file  Tobacco Use   Smoking status: Never   Smokeless tobacco: Never  Vaping Use   Vaping status: Never Used  Substance and Sexual Activity   Alcohol use: No   Drug use: No   Sexual activity: Not Currently  Other Topics Concern   Not on file  Social History Narrative   Not on file   Social Determinants of Health   Financial Resource Strain: Not on file  Food Insecurity: Not on file  Transportation Needs: Not on file  Physical Activity: Not on file  Stress: Not on file  Social Connections: Not on file  Intimate Partner Violence: Not on file    FAMILY HISTORY: Family History  Problem Relation Age of Onset   Diabetes Father    Alzheimer's disease Father    Basal cell carcinoma Mother        Metastatic    ALLERGIES:  is allergic to hydrocodone, vioxx [rofecoxib], codeine, doxycycline hyclate, and latex.  MEDICATIONS:  Current Outpatient Medications  Medication Sig Dispense Refill   acyclovir (ZOVIRAX) 400 MG tablet Take 1 tablet (400 mg total) by mouth 2 (two) times daily. 180 tablet 1   ALPRAZolam (XANAX) 0.25 MG tablet Take 0.25 mg by mouth 3 (three) times daily as needed.     amLODipine (NORVASC) 5 MG tablet Take 5 mg by mouth daily.     aspirin 81 MG tablet Take 81 mg by mouth daily.     bacitracin-neomycin-polymyxin-hydrocortisone (CORTISPORIN) 1 % ophthalmic ointment Place 1 Application into the right eye 2 (two) times daily. 3.5 g 0   Cholecalciferol (VITAMIN D3) 2000 UNITS capsule Take 2,000 Units by mouth daily.     dexamethasone (DECADRON) 4 MG tablet Take 40 mg (10 tablets) by mouth once a week. 40 tablet 3   dextromethorphan-guaiFENesin (TUSSIN DM) 10-100 MG/5ML liquid Take 10 mLs by mouth every 4 (four) hours as needed for cough.     fluticasone (FLONASE) 50 MCG/ACT nasal spray Place 2 sprays into the nose at bedtime.      hydrOXYzine (ATARAX) 25 MG tablet Take 25 mg by mouth at bedtime.     lenalidomide (REVLIMID) 10 MG capsule Take 1 capsule (10 mg total) by mouth daily. Take for 21 days, then none for 7 days. Repeat every 28 days. [employer] Auth # [id number]; Date Obtained 06/10/23 21 capsule 0   Probiotic Product (ALIGN PO) Take by mouth daily.     simvastatin (ZOCOR) 20 MG tablet Take 20 mg by mouth every evening.     traMADol (ULTRAM) 50 MG tablet Take 1-2 tablets (50-100 mg total) by mouth every 4 (four) hours as needed. 90 tablet 0   No current facility-administered medications for this visit.    REVIEW OF SYSTEMS:   Constitutional: ( - )  fevers, ( - )  chills , ( - ) night sweats Eyes: ( - ) blurriness of vision, ( - ) double vision, ( - ) watery eyes Ears, nose, mouth, throat, and face: ( - ) mucositis, ( - ) sore throat Respiratory: ( - ) cough, ( - ) dyspnea, ( - ) wheezes Cardiovascular: ( - ) palpitation, ( - ) chest discomfort, ( - ) lower extremity swelling Gastrointestinal:  ( - ) nausea, ( - ) heartburn, ( - ) change in bowel habits Skin: ( - ) abnormal skin rashes Lymphatics: ( - ) new lymphadenopathy, ( - ) easy bruising Neurological: ( - ) numbness, ( - ) tingling, ( - ) new weaknesses Behavioral/Psych: ( - ) mood change, ( - ) new changes  All other systems were reviewed with the patient and are negative.  PHYSICAL EXAMINATION: ECOG PERFORMANCE STATUS: 1 - Symptomatic but completely ambulatory  Vitals:   06/10/23 1058  BP: 139/80  Pulse: 76  Resp: 14  Temp: 97.8 F (36.6 C)  SpO2: 98%    Filed Weights   06/10/23 1058  Weight: 125 lb 9.6 oz (57 kg)     GENERAL: Well-appearing elderly Caucasian female, alert, no distress and comfortable SKIN: skin color, texture, turgor are normal, no rashes or significant lesions EYES: conjunctiva are pink and non-injected, sclera clear. Stye on lower eyelid of right eye that is erythematous. No discharge noted.  LUNGS: clear to auscultation  and percussion with normal breathing effort HEART: regular rate & rhythm and no murmurs and no lower extremity edema Musculoskeletal: no cyanosis of digits and no clubbing  PSYCH: alert & oriented x 3, fluent speech NEURO: no focal motor/sensory deficits  LABORATORY DATA:  I have reviewed the data as listed    Latest Ref Rng & Units 06/10/2023   10:28 AM 05/27/2023    9:04 AM 05/13/2023    1:37 PM  CBC  WBC 4.0 - 10.5 K/uL 6.8  6.2  7.8   Hemoglobin 12.0 - 15.0 g/dL 40.9  81.1  91.4   Hematocrit 36.0 - 46.0 % 40.3  40.7  40.1   Platelets 150 - 400 K/uL 199  215  224        Latest Ref Rng & Units 06/10/2023   10:28 AM 05/27/2023    9:04 AM 05/13/2023    1:37 PM  CMP  Glucose 70 - 99 mg/dL 782  956  213   BUN 8 - 23 mg/dL 12  12  13    Creatinine 0.44 - 1.00 mg/dL 0.86  5.78  4.69   Sodium 135 - 145 mmol/L 139  142  139   Potassium 3.5 - 5.1 mmol/L 4.0  3.7  3.9   Chloride 98 - 111 mmol/L 106  107  107   CO2 22 - 32 mmol/L 28  29  24    Calcium 8.9 - 10.3 mg/dL 8.9  8.9  9.0   Total Protein 6.5 - 8.1 g/dL 6.2  5.9  6.3   Total Bilirubin <1.2 mg/dL 0.6  0.5  0.6   Alkaline Phos 38 - 126 U/L 61  67  85   AST 15 - 41 U/L 15  15  16    ALT 0 - 44 U/L 9  9  10      Lab Results  Component Value Date   MPROTEIN 0.2 (H) 05/27/2023   MPROTEIN 0.2 (H) 04/29/2023   MPROTEIN 0.1 (H) 04/01/2023   Lab Results  Component Value Date  KPAFRELGTCHN 153.3 (H) 05/27/2023   KPAFRELGTCHN 191.5 (H) 04/29/2023   KPAFRELGTCHN 174.6 (H) 04/01/2023   LAMBDASER 2.6 (L) 05/27/2023   LAMBDASER <1.5 (L) 04/29/2023   LAMBDASER 2.1 (L) 04/01/2023   KAPLAMBRATIO 58.96 (H) 05/27/2023   KAPLAMBRATIO Note: (A) 04/29/2023   KAPLAMBRATIO 83.14 (H) 04/01/2023   Lab Results  Component Value Date   MPROTEIN 0.2 (H) 05/27/2023   MPROTEIN 0.2 (H) 04/29/2023   MPROTEIN 0.1 (H) 04/01/2023   Lab Results  Component Value Date   KPAFRELGTCHN 153.3 (H) 05/27/2023   KPAFRELGTCHN 191.5 (H) 04/29/2023    KPAFRELGTCHN 174.6 (H) 04/01/2023   LAMBDASER 2.6 (L) 05/27/2023   LAMBDASER <1.5 (L) 04/29/2023   LAMBDASER 2.1 (L) 04/01/2023   KAPLAMBRATIO 58.96 (H) 05/27/2023   KAPLAMBRATIO Note: (A) 04/29/2023   KAPLAMBRATIO 83.14 (H) 04/01/2023     RADIOGRAPHIC STUDIES: No results found.  ASSESSMENT & PLAN Alice Anthony 82 y.o. female with medical history significant for IgG kappa multiple myeloma who presents for a follow up visit.   # IgG Kappa Multiple Myeloma # Plasmacytoma of Spine  -- Patient diagnosed with plasmacytoma of the spine, subsequent bone marrow biopsy confirmed an IgG kappa multiple myeloma involving the bone marrow --start of Velcade/Dex Cycle 1 Day 1 on 12/24/2022. Plan: -- Labs today show creatinine 0.70, white blood cell 6.8, hemoglobin 14.2, MCV 96.6, and platelets of 199 --Due to ocular toxicity, recommend to discontinue Velcade therapy and switch to Daratumumab.  Patient is still recovering from styes. --Last M protein 0.2 on 05/27/2023 with kappa free light chains of  153.35, lambda 2.6, and ratio of 58.96 --will begin process of adding Revlimid to the patient's treatment regimen.  Her hemoglobin is strong and her kidney function is good. Plan to add by start of next cycle.  -- Return to clinic in 2 weeks with continued Dara/Dex.   #Ocular toxicity/Stye --Secondary to Velcade therapy --Used Moxifloxacin drops with improvement --Received azithromycin x 5 days.   #Supportive Care -- chemotherapy education complete -- port placement not required   -- zofran 8mg  q8H PRN and compazine 10mg  PO q6H for nausea -- acyclovir 400mg  PO BID for VCZ prophylaxis -- Recieved dental clearance prior to the start of Xgeva/Zometa. First dose on 01/07/2023. Last performed on 04/09/2023. Next dose in Dec 2024.  -- Tramadol 50 mg every 6 hours as needed for pain control  No orders of the defined types were placed in this encounter.  All questions were answered. The patient knows to  call the clinic with any problems, questions or concerns.  I have spent a total of 30 minutes minutes of face-to-face and non-face-to-face time, preparing to see the patient, performing a medically appropriate examination, counseling and educating the patient,  documenting clinical information in the electronic health record,and care coordination.   Ulysees Barns, MD Department of Hematology/Oncology [facility] Phone: [phone] Pager: [phone] Email: [email or web address]   06/11/2023 3:24 PM

## 2023-06-10 NOTE — Telephone Encounter (Signed)
Oral Oncology Pharmacist Encounter  Received new prescription for Revlimid (lenalidomide) for the treatment of multiple myeloma in conjunction with daratumumab and dexamethasone, planned duration until disease progression or unacceptable drug toxicity.  CBC w/ Diff and CMP from 06/10/23 assessed, patient Scr of 0.70 (CrCl ~56.7 mL/min) - planned dose of Revlimid is 10 mg/d - appropriate for patient's renal function. Prescription dose and frequency assessed for appropriateness.  Current medication list in Epic reviewed, no relevant/significant DDIs with Revlimid identified.  Evaluated chart and no patient barriers to medication adherence noted.   Prescription will need to be e-scribed to [facility] for dispensing given patient's insurance and medication being limited distribution.   Oral Oncology Clinic will continue to follow for insurance authorization, copayment issues, initial counseling and start date.  Lenord Carbo, PharmD, BCPS, [facility] Hematology/Oncology Clinical Pharmacist [facility] and [facility] Oral Chemotherapy Navigation Clinics [phone] 06/10/2023 2:55 PM

## 2023-06-11 ENCOUNTER — Encounter: Payer: Self-pay | Admitting: Hematology and Oncology

## 2023-06-11 MED ORDER — LENALIDOMIDE 10 MG PO CAPS: 10.0000 mg | ORAL_CAPSULE | Freq: Every day | ORAL | 0 refills | Status: DC

## 2023-06-17 ENCOUNTER — Other Ambulatory Visit: Payer: Self-pay | Admitting: Hematology and Oncology

## 2023-06-17 NOTE — Telephone Encounter (Signed)
Oral Chemotherapy Pharmacist Encounter   Spoke with patient's daughter, Bonney Roussel, to follow up regarding Revlimid (lenalidomide) delivery to patient's home and discuss medication counseling.   Informed by patient's daughter that she has decided at this time she does not want to start Revlimid as she doesn't want to add additional side effects onto her current treatment of daratumumab/dexamethasone. Offered to discuss any further medication questions regarding the Revlmid in detail with daughter, but offer was kindly declined.   Patient's daughter grateful for phone call and knows to call the office for any questions or concerns - oral chemotherapy clinic will sign off for now.   Lenord Carbo, PharmD, BCPS, [facility] Hematology/Oncology Clinical Pharmacist [facility] and [facility] Oral Chemotherapy Navigation Clinics [phone] 06/17/2023 10:21 AM

## 2023-06-19 ENCOUNTER — Other Ambulatory Visit: Payer: Self-pay | Admitting: Hematology and Oncology

## 2023-06-19 NOTE — Telephone Encounter (Signed)
RX sent 06/17/23 by Dr. Leonides Schanz

## 2023-06-21 NOTE — Progress Notes (Unsigned)
Palliative Medicine [facility]  Telephone:([phone] Fax:(336) [phone]   Name: Alice Anthony Date: 06/21/2023 MRN: [id number]  DOB: [birth year]  Patient Care Team: Alice Palmer, MD as PCP - General (Family Medicine) Pickenpack-Cousar, [name], NP as Nurse Practitioner (Nurse Practitioner)    INTERVAL HISTORY: Alice Anthony is a 82 y.o. female with oncologic medical history including recent diagnosis of multiple myeloma (12/2022), DM, and posterior vitreous detachment of left and right eyes. Palliative ask to see for symptom management and goals of care   SOCIAL HISTORY:     reports that she has never smoked. She has never used smokeless tobacco. She reports that she does not drink alcohol and does not use drugs.  ADVANCE DIRECTIVES:  Advanced directives on file  CODE STATUS: Full code  PAST MEDICAL HISTORY: Past Medical History:  Diagnosis Date  . Anxiety   . Arthritis   . Asthma    Hx: of  . Cancer (HCC)    Hx: of skin cancer  . Diverticulosis   . GERD (gastroesophageal reflux disease)    Hx: of  . History of kidney stones   . Hyperlipemia   . Hypertension   . Pre-diabetes     ALLERGIES:  is allergic to hydrocodone, vioxx [rofecoxib], codeine, doxycycline hyclate, and latex.  MEDICATIONS:  Current Outpatient Medications  Medication Sig Dispense Refill  . acyclovir (ZOVIRAX) 400 MG tablet Take 1 tablet by mouth twice daily 180 tablet 0  . ALPRAZolam (XANAX) 0.25 MG tablet Take 0.25 mg by mouth 3 (three) times daily as needed.    Marland Kitchen amLODipine (NORVASC) 5 MG tablet Take 5 mg by mouth daily.    Marland Kitchen aspirin 81 MG tablet Take 81 mg by mouth daily.    . bacitracin-neomycin-polymyxin-hydrocortisone (CORTISPORIN) 1 % ophthalmic ointment Place 1 Application into the right eye 2 (two) times daily. 3.5 g 0  . Cholecalciferol (VITAMIN D3) 2000 UNITS capsule Take 2,000 Units by mouth daily.    Marland Kitchen dexamethasone (DECADRON) 4 MG tablet Take 40 mg (10  tablets) by mouth once a week. 40 tablet 3  . dextromethorphan-guaiFENesin (TUSSIN DM) 10-100 MG/5ML liquid Take 10 mLs by mouth every 4 (four) hours as needed for cough.    . fluticasone (FLONASE) 50 MCG/ACT nasal spray Place 2 sprays into the nose at bedtime.    . hydrOXYzine (ATARAX) 25 MG tablet Take 25 mg by mouth at bedtime.    Marland Kitchen lenalidomide (REVLIMID) 10 MG capsule Take 1 capsule (10 mg total) by mouth daily. Take for 21 days, then none for 7 days. Repeat every 28 days. [employer] Auth # [id number]; Date Obtained 06/10/23 21 capsule 0  . Probiotic Product (ALIGN PO) Take by mouth daily.    . simvastatin (ZOCOR) 20 MG tablet Take 20 mg by mouth every evening.    . traMADol (ULTRAM) 50 MG tablet Take 1-2 tablets (50-100 mg total) by mouth every 4 (four) hours as needed. 90 tablet 0   No current facility-administered medications for this visit.    VITAL SIGNS: There were no vitals taken for this visit. There were no vitals filed for this visit.  Estimated body mass index is 23.73 kg/m as calculated from the following:   Height as of 05/27/23: 5\' 1"  (1.549 m).   Weight as of 06/10/23: 125 lb 9.6 oz (57 kg).   PERFORMANCE STATUS (ECOG) : 2 - Symptomatic, <50% confined to bed   Physical Exam General: NAD Cardiovascular: regular rate and rhythm  Pulmonary: normal breathing pattern  Extremities: no edema, no joint deformities Skin: no rashes Neurological: AAO x4  IMPRESSION:  Alice Anthony is currently undergoing chemotherapy tolerating well. Reports experiencing elevated blood sugar levels on treatment days, with readings reaching up to 400. This has led to instances of sleep deprivation as the patient prefers to stay awake until her sugar levels decrease.   Neoplasm related pain Alice Anthony reports pain is controlled overall. Rating it at a 6 or 7 on the day of the consultation, but note that it was beginning to subside after taking two tramadol at 9 am. The patient attributes the  increased pain to their active lifestyle, as they spend most of the day on their feet and engage in regular activities. Reports difficulty with lifting heavy objects due to the associated pain. Despite these challenges, the patient expresses a desire to remain active and continue with daily activities.  Some difficulty with long-distance walking, unable to traverse the length of the clinic without needing to rest. Her pain is managed daily typically with two tramadol in the morning, occasionally requiring a second dose depending on their activity level.   We discussed continuing current regimen without changes at this time. Taking medication as needed. Will continue close follow-up.    Nausea  Controlled   Constipation The patient has been experiencing constipation occasionally, which she feels is managed by adjusting her Senna dosage as needed.   Goals of Care  5/20- We discussed her current illness and what it means in the larger context of her on-going co-morbidities. Natural disease trajectory and expectations were discussed.   Patient and daughter are realistic in their understanding of current illness. Is remaining hopeful for stability and improve quality of life.   We discussed Her current illness and what it means in the larger context of Her on-going co-morbidities. Natural disease trajectory and expectations were discussed.  I discussed the importance of continued conversation with family and their medical providers regarding overall plan of care and treatment options, ensuring decisions are within the context of the patients values and GOCs.  PLAN:  Cancer Pain Moderate pain (6-7/10) managed with Tramadol. Pain increases with activity and weight-bearing tasks. -Continue Tramadol as needed for pain. Refill prescription sent to pharmacy.  Hyperglycemia Reports of high blood sugar levels causing insomnia. -Continue current management and monitor blood sugar levels closely.  Follow up with PCP as needed.  Physical Limitations Difficulty with long-distance walking and lifting heavy objects due to pain and weakness. -Encouraged to listen to body and not overexert.  Allergies Switched from Zyrtec to Xyzal per Dr. Laurine Blazer' recommendation and reports better control of symptoms. -Continue Xyzal for allergy management.  Medication Reconciliation Confirmed continued use of Simvastatin, Atarax, Flonase, Dexamethasone, Vitamin D3, Aspirin, Norvasc, Acyclovir, and Xanax. -Continue current medication regimen.  Follow-up Next appointment scheduled for November 2024 in collaboration to other oncology appointments.    Patient expressed understanding and was in agreement with this plan. She also understands that She can call the clinic at any time with any questions, concerns, or complaints.   Any controlled substances utilized were prescribed in the context of palliative care. PDMP has been reviewed.    Visit consisted of counseling and education dealing with the complex and emotionally intense issues of symptom management and palliative care in the setting of serious and potentially life-threatening illness.  Willette Alma, AGPCNP-BC  Palliative Medicine Team/[facility]  *Please note that this is a verbal dictation therefore any spelling or  grammatical errors are due to the "[employer]agon [employer] One" system interpretation.

## 2023-06-24 ENCOUNTER — Inpatient Hospital Stay: Payer: Medicare Other

## 2023-06-24 ENCOUNTER — Encounter: Payer: Self-pay | Admitting: *Deleted

## 2023-06-24 ENCOUNTER — Inpatient Hospital Stay (HOSPITAL_BASED_OUTPATIENT_CLINIC_OR_DEPARTMENT_OTHER): Payer: Medicare Other | Admitting: Nurse Practitioner

## 2023-06-24 ENCOUNTER — Encounter: Payer: Self-pay | Admitting: Nurse Practitioner

## 2023-06-24 VITALS — BP 124/76 | HR 91 | Temp 98.2°F | Resp 14 | Ht 61.0 in | Wt 127.1 lb

## 2023-06-24 DIAGNOSIS — C9000 Multiple myeloma not having achieved remission: Secondary | ICD-10-CM

## 2023-06-24 DIAGNOSIS — Z515 Encounter for palliative care: Secondary | ICD-10-CM

## 2023-06-24 DIAGNOSIS — Z79899 Other long term (current) drug therapy: Secondary | ICD-10-CM | POA: Diagnosis not present

## 2023-06-24 DIAGNOSIS — G893 Neoplasm related pain (acute) (chronic): Secondary | ICD-10-CM | POA: Diagnosis not present

## 2023-06-24 DIAGNOSIS — Z7189 Other specified counseling: Secondary | ICD-10-CM | POA: Diagnosis not present

## 2023-06-24 DIAGNOSIS — R530 Neoplastic (malignant) related fatigue: Secondary | ICD-10-CM | POA: Diagnosis not present

## 2023-06-24 DIAGNOSIS — Z5112 Encounter for antineoplastic immunotherapy: Secondary | ICD-10-CM | POA: Diagnosis not present

## 2023-06-24 LAB — CMP (CANCER CENTER ONLY)
ALT: 10 U/L (ref 0–44)
AST: 14 U/L — ABNORMAL LOW (ref 15–41)
Albumin: 4.3 g/dL (ref 3.5–5.0)
Alkaline Phosphatase: 66 U/L (ref 38–126)
Anion gap: 7 (ref 5–15)
BUN: 15 mg/dL (ref 8–23)
CO2: 25 mmol/L (ref 22–32)
Calcium: 9.1 mg/dL (ref 8.9–10.3)
Chloride: 105 mmol/L (ref 98–111)
Creatinine: 0.88 mg/dL (ref 0.44–1.00)
GFR, Estimated: >60 mL/min (ref >60)
Glucose, Bld: 264 mg/dL — ABNORMAL HIGH (ref 70–99)
Potassium: 3.8 mmol/L (ref 3.5–5.1)
Sodium: 137 mmol/L (ref 135–145)
Total Bilirubin: 0.6 mg/dL (ref <1.2)
Total Protein: 6.3 g/dL — ABNORMAL LOW (ref 6.5–8.1)

## 2023-06-24 LAB — CBC WITH DIFFERENTIAL (CANCER CENTER ONLY)
Abs Immature Granulocytes: 0.02 10*3/uL (ref 0.00–0.07)
Basophils Absolute: 0.1 10*3/uL (ref 0.0–0.1)
Basophils Relative: 1 %
Eosinophils Absolute: 0 10*3/uL (ref 0.0–0.5)
Eosinophils Relative: 0 %
HCT: 41.3 % (ref 36.0–46.0)
Hemoglobin: 14.4 g/dL (ref 12.0–15.0)
Immature Granulocytes: 0 %
Lymphocytes Relative: 3 %
Lymphs Abs: 0.3 10*3/uL — ABNORMAL LOW (ref 0.7–4.0)
MCH: 33.7 pg (ref 26.0–34.0)
MCHC: 34.9 g/dL (ref 30.0–36.0)
MCV: 96.7 fL (ref 80.0–100.0)
Monocytes Absolute: 0 10*3/uL — ABNORMAL LOW (ref 0.1–1.0)
Monocytes Relative: 0 %
Neutro Abs: 8.2 10*3/uL — ABNORMAL HIGH (ref 1.7–7.7)
Neutrophils Relative %: 96 %
Platelet Count: 229 10*3/uL (ref 150–400)
RBC: 4.27 MIL/uL (ref 3.87–5.11)
RDW: 13.2 % (ref 11.5–15.5)
WBC Count: 8.5 10*3/uL (ref 4.0–10.5)
nRBC: 0 % (ref 0.0–0.2)

## 2023-06-24 MED ORDER — DIPHENHYDRAMINE HCL 25 MG PO CAPS
25.0000 mg | ORAL_CAPSULE | Freq: Once | ORAL | Status: AC
  Administered 2023-06-24: 25 mg via ORAL
  Filled 2023-06-24: qty 1

## 2023-06-24 MED ORDER — ACETAMINOPHEN 325 MG PO TABS
650.0000 mg | ORAL_TABLET | Freq: Once | ORAL | Status: AC
  Administered 2023-06-24: 650 mg via ORAL
  Filled 2023-06-24: qty 2

## 2023-06-24 MED ORDER — DARATUMUMAB-HYALURONIDASE-FIHJ 1800-30000 MG-UT/15ML ~~LOC~~ SOLN
1800.0000 mg | Freq: Once | SUBCUTANEOUS | Status: AC
  Administered 2023-06-24: 1800 mg via SUBCUTANEOUS
  Filled 2023-06-24: qty 15

## 2023-06-24 MED ORDER — MONTELUKAST SODIUM 10 MG PO TABS
10.0000 mg | ORAL_TABLET | Freq: Once | ORAL | Status: AC
  Administered 2023-06-24: 10 mg via ORAL
  Filled 2023-06-24: qty 1

## 2023-06-24 NOTE — Progress Notes (Signed)
Patient reports she took PO steroids at home prior to arrival

## 2023-06-25 DIAGNOSIS — D3131 Benign neoplasm of right choroid: Secondary | ICD-10-CM | POA: Diagnosis not present

## 2023-06-25 DIAGNOSIS — E119 Type 2 diabetes mellitus without complications: Secondary | ICD-10-CM | POA: Diagnosis not present

## 2023-06-25 DIAGNOSIS — H43813 Vitreous degeneration, bilateral: Secondary | ICD-10-CM | POA: Diagnosis not present

## 2023-06-25 LAB — KAPPA/LAMBDA LIGHT CHAINS
Kappa free light chain: 121.6 mg/L — ABNORMAL HIGH (ref 3.3–19.4)
Kappa, lambda light chain ratio: 57.9 — ABNORMAL HIGH (ref 0.26–1.65)
Lambda free light chains: 2.1 mg/L — ABNORMAL LOW (ref 5.7–26.3)

## 2023-06-30 LAB — MULTIPLE MYELOMA PANEL, SERUM
Albumin SerPl Elph-Mcnc: 3.9 g/dL (ref 2.9–4.4)
Albumin/Glob SerPl: 2 — ABNORMAL HIGH (ref 0.7–1.7)
Alpha 1: 0.2 g/dL (ref 0.0–0.4)
Alpha2 Glob SerPl Elph-Mcnc: 0.7 g/dL (ref 0.4–1.0)
B-Globulin SerPl Elph-Mcnc: 0.8 g/dL (ref 0.7–1.3)
Gamma Glob SerPl Elph-Mcnc: 0.3 g/dL — ABNORMAL LOW (ref 0.4–1.8)
Globulin, Total: 2 g/dL — ABNORMAL LOW (ref 2.2–3.9)
IgA: 9 mg/dL — ABNORMAL LOW (ref 64–422)
IgG (Immunoglobin G), Serum: 370 mg/dL — ABNORMAL LOW (ref 586–1602)
IgM (Immunoglobulin M), Srm: 14 mg/dL — ABNORMAL LOW (ref 26–217)
M Protein SerPl Elph-Mcnc: 0.2 g/dL — ABNORMAL HIGH
Total Protein ELP: 5.9 g/dL — ABNORMAL LOW (ref 6.0–8.5)

## 2023-07-07 NOTE — Progress Notes (Unsigned)
[facility] [phone]   [phone]  PROGRESS NOTE  Patient Care Team: [name], MD as PCP - General (Family Medicine) Pickenpack-Cousar, [name], NP as Nurse Practitioner (Nurse Practitioner)  Hematological/Oncological History # Plasmacytoma of Thoracic Spine  # IgG Kappa Multiple myeloma 09/22/2022: Patient underwent MRI thoracic spine which showed acute or subacute T7, T10, and T11 compression fractures 10/10/2022: biopsy of compression fracture of T11 reveals a plasmacytoma 11/13/2022: establish care with Dr. Leonides Schanz  12/06/2022: bone marrow biopsy showed plasma cell myeloma involving 80% of the marrow.  12/24/2022: Cycle 1 Day 1 of Velcade/Dex 01/14/2023: Cycle 2 Day 1 of Velcade/Dex 02/04/2023: Cycle 3 Day 1 of Velcade/Dex 02/26/2023: Cycle 4 Day 1 of Velcade/Dex HELD due to ocular toxicity 03/04/2023: Cycle 1 Day 1 of Dara/Dex  04/01/2023: Cycle 2 Day 1 of Dara/Dex  04/29/2023: Cycle 3 Day 1 of Dara/Dex  05/27/2023: Cycle 4 Day 1 of Dara/Dex   Interval History:  Alice Anthony 82 y.o. female with medical history significant for newly diagnosed IgG kappa multiple myeloma who presents for a follow up visit. The patient's last visit was on 06/10/2023.  In the interim since the last visit she has continued on Velcade therapy.   On exam today Ms. Kagel is accompanied by her daughter today.  She reports she has been well overall in the interim since her last visit.  She has been tolerating her treatment well with no major side effects.  She reports that she continues to take about 2 tramadol per day for her back pain.  She reports that she is also taking 5 pills of the dexamethasone at 20 mg.  She reports that she had a wonderful Thanksgiving and was with about 10 people.  The bulk of our discussion today focused on the addition of Revlimid to her therapy.  She notes that she was agreeable to this but is considering starting after the Christmas holiday.   Overall she is willing and able to continue with chemotherapy at this time. She denies any fevers, chills, sweats, shortness of breath, chest pain or cough.  A full 10 point ROS is otherwise negative.   MEDICAL HISTORY:  Past Medical History:  Diagnosis Date   Anxiety    Arthritis    Asthma    Hx: of   Cancer (HCC)    Hx: of skin cancer   Diverticulosis    GERD (gastroesophageal reflux disease)    Hx: of   History of kidney stones    Hyperlipemia    Hypertension    Pre-diabetes     SURGICAL HISTORY: Past Surgical History:  Procedure Laterality Date   ABDOMINAL SURGERY  09/19/2012   Ruptured Diverticuli, Ostomy Placed.   BREAST BIOPSY  06/06/2012   breast biopsy   CATARACT EXTRACTION W/ INTRAOCULAR LENS IMPLANT Bilateral    COLONOSCOPY  03/06/2012   COLOSTOMY CLOSURE  12/25/2012   Dr Luisa Hart   COLOSTOMY CLOSURE N/A 12/25/2012   Procedure: COLOSTOMY CLOSURE;  Surgeon: Clovis Pu. Cornett, MD;  Location: [facility] OR;  Service: General;  Laterality: N/A;   EXTRACORPOREAL SHOCK WAVE LITHOTRIPSY Left 11/17/2018   Procedure: EXTRACORPOREAL SHOCK WAVE LITHOTRIPSY (ESWL);  Surgeon: Malen Gauze, MD;  Location: [facility];  Service: Urology;  Laterality: Left;   EYE SURGERY  10/19/1998   Hx: of cyst removed from left eye   KYPHOPLASTY N/A 10/10/2022   Procedure: KYPHOPLASTY Thoracic seven , Thoracic ten,Thoracic eleven;  Surgeon: Tressie Stalker, MD;  Location: [facility] OR;  Service: Neurosurgery;  Laterality: N/A;   LAPAROTOMY N/A 09/19/2012   Procedure: EXPLORATORY LAPAROTOMY, sigmoid colectomy;  Surgeon: Clovis Pu. Cornett, MD;  Location: [facility];  Service: General;  Laterality: N/A;   SIGMOIDOSCOPY  01/11/1999   TONSILLECTOMY     TRIGGER FINGER RELEASE  12/25/2010   TUBAL LIGATION  10/16/1985    SOCIAL HISTORY: Social History   Socioeconomic History   Marital status: Widowed    Spouse name: Not on file   Number of children: Not on file   Years of education: Not on file   Highest  education level: Not on file  Occupational History   Not on file  Tobacco Use   Smoking status: Never   Smokeless tobacco: Never  Vaping Use   Vaping status: Never Used  Substance and Sexual Activity   Alcohol use: No   Drug use: No   Sexual activity: Not Currently  Other Topics Concern   Not on file  Social History Narrative   Not on file   Social Determinants of Health   Financial Resource Strain: Not on file  Food Insecurity: Not on file  Transportation Needs: Not on file  Physical Activity: Not on file  Stress: Not on file  Social Connections: Not on file  Intimate Partner Violence: Not on file    FAMILY HISTORY: Family History  Problem Relation Age of Onset   Diabetes Father    Alzheimer's disease Father    Basal cell carcinoma Mother        Metastatic    ALLERGIES:  is allergic to hydrocodone, vioxx [rofecoxib], codeine, doxycycline hyclate, and latex.  MEDICATIONS:  Current Outpatient Medications  Medication Sig Dispense Refill   acyclovir (ZOVIRAX) 400 MG tablet Take 1 tablet by mouth twice daily 180 tablet 0   ALPRAZolam (XANAX) 0.25 MG tablet Take 0.25 mg by mouth 3 (three) times daily as needed.     amLODipine (NORVASC) 5 MG tablet Take 5 mg by mouth daily.     aspirin 81 MG tablet Take 81 mg by mouth daily.     bacitracin-neomycin-polymyxin-hydrocortisone (CORTISPORIN) 1 % ophthalmic ointment Place 1 Application into the right eye 2 (two) times daily. 3.5 g 0   Cholecalciferol (VITAMIN D3) 2000 UNITS capsule Take 2,000 Units by mouth daily.     dexamethasone (DECADRON) 4 MG tablet Take 40 mg (10 tablets) by mouth once a week. 40 tablet 3   dextromethorphan-guaiFENesin (TUSSIN DM) 10-100 MG/5ML liquid Take 10 mLs by mouth every 4 (four) hours as needed for cough.     fluticasone (FLONASE) 50 MCG/ACT nasal spray Place 2 sprays into the nose at bedtime.     hydrOXYzine (ATARAX) 25 MG tablet Take 25 mg by mouth at bedtime.     lenalidomide (REVLIMID) 10 MG  capsule Take 1 capsule (10 mg total) by mouth daily. Take for 21 days, then none for 7 days. Repeat every 28 days. [employer] Auth # [id number]; Date Obtained 07/08/23 21 capsule 0   Probiotic Product (ALIGN PO) Take by mouth daily.     simvastatin (ZOCOR) 20 MG tablet Take 20 mg by mouth every evening.     traMADol (ULTRAM) 50 MG tablet Take 1-2 tablets (50-100 mg total) by mouth every 4 (four) hours as needed. 90 tablet 0   No current facility-administered medications for this visit.    REVIEW OF SYSTEMS:   Constitutional: ( - ) fevers, ( - )  chills , ( - ) night sweats Eyes: ( - ) blurriness  of vision, ( - ) double vision, ( - ) watery eyes Ears, nose, mouth, throat, and face: ( - ) mucositis, ( - ) sore throat Respiratory: ( - ) cough, ( - ) dyspnea, ( - ) wheezes Cardiovascular: ( - ) palpitation, ( - ) chest discomfort, ( - ) lower extremity swelling Gastrointestinal:  ( - ) nausea, ( - ) heartburn, ( - ) change in bowel habits Skin: ( - ) abnormal skin rashes Lymphatics: ( - ) new lymphadenopathy, ( - ) easy bruising Neurological: ( - ) numbness, ( - ) tingling, ( - ) new weaknesses Behavioral/Psych: ( - ) mood change, ( - ) new changes  All other systems were reviewed with the patient and are negative.  PHYSICAL EXAMINATION: ECOG PERFORMANCE STATUS: 1 - Symptomatic but completely ambulatory  Vitals:   07/08/23 1140  BP: (!) 149/80  Pulse: 92  Resp: 16  Temp: 97.7 F (36.5 C)  SpO2: 98%     Filed Weights   07/08/23 1140  Weight: 125 lb 14.4 oz (57.1 kg)      GENERAL: Well-appearing elderly Caucasian female, alert, no distress and comfortable SKIN: skin color, texture, turgor are normal, no rashes or significant lesions EYES: conjunctiva are pink and non-injected, sclera clear.  LUNGS: clear to auscultation and percussion with normal breathing effort HEART: regular rate & rhythm and no murmurs and no lower extremity edema Musculoskeletal: no cyanosis of digits and no  clubbing  PSYCH: alert & oriented x 3, fluent speech NEURO: no focal motor/sensory deficits  LABORATORY DATA:  I have reviewed the data as listed    Latest Ref Rng & Units 07/08/2023   11:12 AM 06/24/2023    1:48 PM 06/10/2023   10:28 AM  CBC  WBC 4.0 - 10.5 K/uL 6.9  8.5  6.8   Hemoglobin 12.0 - 15.0 g/dL 16.1  09.6  04.5   Hematocrit 36.0 - 46.0 % 42.3  41.3  40.3   Platelets 150 - 400 K/uL 200  229  199        Latest Ref Rng & Units 07/08/2023   11:12 AM 06/24/2023    1:48 PM 06/10/2023   10:28 AM  CMP  Glucose 70 - 99 mg/dL 409  811  914   BUN 8 - 23 mg/dL 11  15  12    Creatinine 0.44 - 1.00 mg/dL 7.82  9.56  2.13   Sodium 135 - 145 mmol/L 141  137  139   Potassium 3.5 - 5.1 mmol/L 3.7  3.8  4.0   Chloride 98 - 111 mmol/L 107  105  106   CO2 22 - 32 mmol/L 25  25  28    Calcium 8.9 - 10.3 mg/dL 9.1  9.1  8.9   Total Protein 6.5 - 8.1 g/dL 6.1  6.3  6.2   Total Bilirubin <1.2 mg/dL 0.6  0.6  0.6   Alkaline Phos 38 - 126 U/L 72  66  61   AST 15 - 41 U/L 14  14  15    ALT 0 - 44 U/L 11  10  9      Lab Results  Component Value Date   MPROTEIN 0.2 (H) 06/24/2023   MPROTEIN 0.2 (H) 05/27/2023   MPROTEIN 0.2 (H) 04/29/2023   Lab Results  Component Value Date   KPAFRELGTCHN 121.6 (H) 06/24/2023   KPAFRELGTCHN 153.3 (H) 05/27/2023   KPAFRELGTCHN 191.5 (H) 04/29/2023   LAMBDASER 2.1 (L) 06/24/2023   LAMBDASER 2.6 (L)  05/27/2023   LAMBDASER <1.5 (L) 04/29/2023   KAPLAMBRATIO 57.90 (H) 06/24/2023   KAPLAMBRATIO 58.96 (H) 05/27/2023   KAPLAMBRATIO Note: (A) 04/29/2023   Lab Results  Component Value Date   MPROTEIN 0.2 (H) 06/24/2023   MPROTEIN 0.2 (H) 05/27/2023   MPROTEIN 0.2 (H) 04/29/2023   Lab Results  Component Value Date   KPAFRELGTCHN 121.6 (H) 06/24/2023   KPAFRELGTCHN 153.3 (H) 05/27/2023   KPAFRELGTCHN 191.5 (H) 04/29/2023   LAMBDASER 2.1 (L) 06/24/2023   LAMBDASER 2.6 (L) 05/27/2023   LAMBDASER <1.5 (L) 04/29/2023   KAPLAMBRATIO 57.90 (H) 06/24/2023    KAPLAMBRATIO 58.96 (H) 05/27/2023   KAPLAMBRATIO Note: (A) 04/29/2023     RADIOGRAPHIC STUDIES: No results found.  ASSESSMENT & PLAN Alice Anthony 82 y.o. female with medical history significant for IgG kappa multiple myeloma who presents for a follow up visit.   # IgG Kappa Multiple Myeloma # Plasmacytoma of Spine  -- Patient diagnosed with plasmacytoma of the spine, subsequent bone marrow biopsy confirmed an IgG kappa multiple myeloma involving the bone marrow --start of Velcade/Dex Cycle 1 Day 1 on 12/24/2022. Plan: -- Labs today show creatinine 0.69, white blood cell 6.9, Hgb 14.4, MCV 97, Plt 200 --Due to ocular toxicity, recommend to discontinue Velcade therapy and switch to Daratumumab.  Patient is still recovering from styes. --Last M protein 0.2 on 06/24/2023 with kappa free light chains of  121.6, lambda 2.1, and ratio of 57.90 --She has become frightened by the reported potential side effects of Revlimid.  She would like to hold off on starting this medication and will reconsider in the new year. -- Return to clinic in 2 weeks with continued Dara/Dex.   #Ocular toxicity/Stye --Secondary to Velcade therapy --Used Moxifloxacin drops with improvement --Received azithromycin x 5 days.   #Supportive Care -- chemotherapy education complete -- port placement not required   -- zofran 8mg  q8H PRN and compazine 10mg  PO q6H for nausea -- acyclovir 400mg  PO BID for VCZ prophylaxis -- Recieved dental clearance prior to the start of Xgeva/Zometa. First dose on 01/07/2023. Last performed on 04/09/2023. Next dose in Dec 2024 (today) -- Tramadol 50 mg every 6 hours as needed for pain control  No orders of the defined types were placed in this encounter.  All questions were answered. The patient knows to call the clinic with any problems, questions or concerns.  I have spent a total of 30 minutes minutes of face-to-face and non-face-to-face time, preparing to see the patient,  performing a medically appropriate examination, counseling and educating the patient,  documenting clinical information in the electronic health record,and care coordination.   Ulysees Barns, MD Department of Hematology/Oncology [facility] Phone: [phone] Pager: [phone] Email: [email or web address]   07/08/2023 3:09 PM

## 2023-07-08 ENCOUNTER — Inpatient Hospital Stay (HOSPITAL_BASED_OUTPATIENT_CLINIC_OR_DEPARTMENT_OTHER): Payer: Medicare Other | Admitting: Hematology and Oncology

## 2023-07-08 ENCOUNTER — Other Ambulatory Visit: Payer: Self-pay | Admitting: *Deleted

## 2023-07-08 ENCOUNTER — Inpatient Hospital Stay: Payer: Medicare Other | Attending: Hematology and Oncology

## 2023-07-08 VITALS — BP 134/76 | HR 91 | Temp 98.3°F | Resp 18

## 2023-07-08 VITALS — BP 149/80 | HR 92 | Temp 97.7°F | Resp 16 | Wt 125.9 lb

## 2023-07-08 DIAGNOSIS — Z79899 Other long term (current) drug therapy: Secondary | ICD-10-CM | POA: Diagnosis not present

## 2023-07-08 DIAGNOSIS — C9000 Multiple myeloma not having achieved remission: Secondary | ICD-10-CM | POA: Diagnosis not present

## 2023-07-08 DIAGNOSIS — Z5112 Encounter for antineoplastic immunotherapy: Secondary | ICD-10-CM | POA: Diagnosis not present

## 2023-07-08 DIAGNOSIS — R530 Neoplastic (malignant) related fatigue: Secondary | ICD-10-CM | POA: Diagnosis not present

## 2023-07-08 LAB — CBC WITH DIFFERENTIAL (CANCER CENTER ONLY)
Abs Immature Granulocytes: 0.02 10*3/uL (ref 0.00–0.07)
Basophils Absolute: 0.1 10*3/uL (ref 0.0–0.1)
Basophils Relative: 1 %
Eosinophils Absolute: 0 10*3/uL (ref 0.0–0.5)
Eosinophils Relative: 0 %
HCT: 42.3 % (ref 36.0–46.0)
Hemoglobin: 14.4 g/dL (ref 12.0–15.0)
Immature Granulocytes: 0 %
Lymphocytes Relative: 6 %
Lymphs Abs: 0.4 10*3/uL — ABNORMAL LOW (ref 0.7–4.0)
MCH: 33 pg (ref 26.0–34.0)
MCHC: 34 g/dL (ref 30.0–36.0)
MCV: 97 fL (ref 80.0–100.0)
Monocytes Absolute: 0.1 10*3/uL (ref 0.1–1.0)
Monocytes Relative: 1 %
Neutro Abs: 6.4 10*3/uL (ref 1.7–7.7)
Neutrophils Relative %: 92 %
Platelet Count: 200 10*3/uL (ref 150–400)
RBC: 4.36 MIL/uL (ref 3.87–5.11)
RDW: 12.9 % (ref 11.5–15.5)
WBC Count: 6.9 10*3/uL (ref 4.0–10.5)
nRBC: 0 % (ref 0.0–0.2)

## 2023-07-08 LAB — CMP (CANCER CENTER ONLY)
ALT: 11 U/L (ref 0–44)
AST: 14 U/L — ABNORMAL LOW (ref 15–41)
Albumin: 4.4 g/dL (ref 3.5–5.0)
Alkaline Phosphatase: 72 U/L (ref 38–126)
Anion gap: 9 (ref 5–15)
BUN: 11 mg/dL (ref 8–23)
CO2: 25 mmol/L (ref 22–32)
Calcium: 9.1 mg/dL (ref 8.9–10.3)
Chloride: 107 mmol/L (ref 98–111)
Creatinine: 0.69 mg/dL (ref 0.44–1.00)
GFR, Estimated: >60 mL/min (ref >60)
Glucose, Bld: 227 mg/dL — ABNORMAL HIGH (ref 70–99)
Potassium: 3.7 mmol/L (ref 3.5–5.1)
Sodium: 141 mmol/L (ref 135–145)
Total Bilirubin: 0.6 mg/dL (ref <1.2)
Total Protein: 6.1 g/dL — ABNORMAL LOW (ref 6.5–8.1)

## 2023-07-08 MED ORDER — LENALIDOMIDE 10 MG PO CAPS: 10.0000 mg | ORAL_CAPSULE | Freq: Every day | ORAL | 0 refills | Status: DC

## 2023-07-08 MED ORDER — ZOLEDRONIC ACID 4 MG/5ML IV CONC
3.3000 mg | Freq: Once | INTRAVENOUS | Status: AC
  Administered 2023-07-08: 3.3 mg via INTRAVENOUS
  Filled 2023-07-08: qty 4.13

## 2023-07-08 MED ORDER — DIPHENHYDRAMINE HCL 25 MG PO CAPS
25.0000 mg | ORAL_CAPSULE | Freq: Once | ORAL | Status: AC
  Administered 2023-07-08: 25 mg via ORAL
  Filled 2023-07-08: qty 1

## 2023-07-08 MED ORDER — SODIUM CHLORIDE 0.9 % IV SOLN: Freq: Once | INTRAVENOUS | Status: AC

## 2023-07-08 MED ORDER — MONTELUKAST SODIUM 10 MG PO TABS
10.0000 mg | ORAL_TABLET | Freq: Once | ORAL | Status: AC
Start: 2023-07-08 — End: 2023-07-08
  Administered 2023-07-08: 10 mg via ORAL
  Filled 2023-07-08: qty 1

## 2023-07-08 MED ORDER — ACETAMINOPHEN 325 MG PO TABS
650.0000 mg | ORAL_TABLET | Freq: Once | ORAL | Status: AC
  Administered 2023-07-08: 650 mg via ORAL
  Filled 2023-07-08: qty 2

## 2023-07-08 MED ORDER — DARATUMUMAB-HYALURONIDASE-FIHJ 1800-30000 MG-UT/15ML ~~LOC~~ SOLN
1800.0000 mg | Freq: Once | SUBCUTANEOUS | Status: AC
  Administered 2023-07-08: 1800 mg via SUBCUTANEOUS
  Filled 2023-07-08: qty 15

## 2023-07-08 NOTE — Progress Notes (Signed)
Pt. states she took Decadron 20 mg po at home this am. Pt. denies any dental issues or concerns and no recent dental procedures.

## 2023-07-08 NOTE — Patient Instructions (Signed)
[facility] - A DEPT OF [facility]  Discharge Instructions: Thank you for choosing [facility] to provide your oncology and hematology care.   If you have a lab appointment with the Cancer Center, please go directly to the Cancer Center and check in at the registration area.   Wear comfortable clothing and clothing appropriate for easy access to any Portacath or PICC line.   We strive to give you quality time with your provider. You may need to reschedule your appointment if you arrive late (15 or more minutes).  Arriving late affects you and other patients whose appointments are after yours.  Also, if you miss three or more appointments without notifying the office, you may be dismissed from the clinic at the provider's discretion.      For prescription refill requests, have your pharmacy contact our office and allow 72 hours for refills to be completed.    Today you received the following chemotherapy and/or immunotherapy agent: Daratumumab and Hyluronidase (Darzalex Faspro)      To help prevent nausea and vomiting after your treatment, we encourage you to take your nausea medication as directed.  BELOW ARE SYMPTOMS THAT SHOULD BE REPORTED IMMEDIATELY: *FEVER GREATER THAN 100.4 F (38 C) OR HIGHER *CHILLS OR SWEATING *NAUSEA AND VOMITING THAT IS NOT CONTROLLED WITH YOUR NAUSEA MEDICATION *UNUSUAL SHORTNESS OF BREATH *UNUSUAL BRUISING OR BLEEDING *URINARY PROBLEMS (pain or burning when urinating, or frequent urination) *BOWEL PROBLEMS (unusual diarrhea, constipation, pain near the anus) TENDERNESS IN MOUTH AND THROAT WITH OR WITHOUT PRESENCE OF ULCERS (sore throat, sores in mouth, or a toothache) UNUSUAL RASH, SWELLING OR PAIN  UNUSUAL VAGINAL DISCHARGE OR ITCHING   Items with * indicate a potential emergency and should be followed up as soon as possible or go to the Emergency Department if any problems should occur.  Please show the  CHEMOTHERAPY ALERT CARD or IMMUNOTHERAPY ALERT CARD at check-in to the Emergency Department and triage nurse.  Should you have questions after your visit or need to cancel or reschedule your appointment, please contact [facility] - A DEPT OF [facility]  Dept: [phone]  and follow the prompts.  Office hours are 8:00 a.m. to 4:30 p.m. Monday - Friday. Please note that voicemails left after 4:00 p.m. may not be returned until the following business day.  We are closed weekends and major holidays. You have access to a nurse at all times for urgent questions. Please call the main number to the clinic Dept: [phone] and follow the prompts.   For any non-urgent questions, you may also contact your provider using MyChart. We now offer e-Visits for anyone 18 and older to request care online for non-urgent symptoms. For details visit [email or web address].   Also download the MyChart app! Go to the app store, search "MyChart", open the app, select [facility], and log in with your MyChart username and password.  Daratumumab; Hyaluronidase Injection What is this medication? DARATUMUMAB; HYALURONIDASE (dar a toom ue mab; hye al ur ON i dase) treats multiple myeloma, a type of bone marrow cancer. Daratumumab works by blocking a protein that causes cancer cells to grow and multiply. This helps to slow or stop the spread of cancer cells. Hyaluronidase works by increasing the absorption of other medications in the body to help them work better. This medication may also be used treat amyloidosis, a condition that causes the buildup of a protein (  amyloid) in your body. It works by reducing the buildup of this protein, which decreases symptoms. It is a combination medication that contains a monoclonal antibody. This medicine may be used for other purposes; ask your health care provider or pharmacist if you have questions. COMMON BRAND NAME(S): DARZALEX FASPRO What should I tell  my care team before I take this medication? They need to know if you have any of these conditions: Heart disease Infection, such as chickenpox, cold sores, herpes, hepatitis B Lung or breathing disease An unusual or allergic reaction to daratumumab, hyaluronidase, other medications, foods, dyes, or preservatives Pregnant or trying to get pregnant Breast-feeding How should I use this medication? This medication is injected under the skin. It is given by your care team in a hospital or clinic setting. Talk to your care team about the use of this medication in children. Special care may be needed. Overdosage: If you think you have taken too much of this medicine contact a poison control center or emergency room at once. NOTE: This medicine is only for you. Do not share this medicine with others. What if I miss a dose? Keep appointments for follow-up doses. It is important not to miss your dose. Call your care team if you are unable to keep an appointment. What may interact with this medication? Interactions have not been studied. This list may not describe all possible interactions. Give your health care provider a list of all the medicines, herbs, non-prescription drugs, or dietary supplements you use. Also tell them if you smoke, drink alcohol, or use illegal drugs. Some items may interact with your medicine. What should I watch for while using this medication? Your condition will be monitored carefully while you are receiving this medication. This medication can cause serious allergic reactions. To reduce your risk, your care team may give you other medication to take before receiving this one. Be sure to follow the directions from your care team. This medication can affect the results of blood tests to match your blood type. These changes can last for up to 6 months after the final dose. Your care team will do blood tests to match your blood type before you start treatment. Tell all of your  care team that you are being treated with this medication before receiving a blood transfusion. This medication can affect the results of some tests used to determine treatment response; extra tests may be needed to evaluate response. Talk to your care team if you wish to become pregnant or think you are pregnant. This medication can cause serious birth defects if taken during pregnancy and for 3 months after the last dose. A reliable form of contraception is recommended while taking this medication and for 3 months after the last dose. Talk to your care team about effective forms of contraception. Do not breast-feed while taking this medication. What side effects may I notice from receiving this medication? Side effects that you should report to your care team as soon as possible: Allergic reactions--skin rash, itching, hives, swelling of the face, lips, tongue, or throat Heart rhythm changes--fast or irregular heartbeat, dizziness, feeling faint or lightheaded, chest pain, trouble breathing Infection--fever, chills, cough, sore throat, wounds that don't heal, pain or trouble when passing urine, general feeling of discomfort or being unwell Infusion reactions--chest pain, shortness of breath or trouble breathing, feeling faint or lightheaded Sudden eye pain or change in vision such as blurry vision, seeing halos around lights, vision loss Unusual bruising or bleeding Side effects  that usually do not require medical attention (report to your care team if they continue or are bothersome): Constipation Diarrhea Fatigue Nausea Pain, tingling, or numbness in the hands or feet Swelling of the ankles, hands, or feet This list may not describe all possible side effects. Call your doctor for medical advice about side effects. You may report side effects to FDA at 1-800-FDA-1088. Where should I keep my medication? This medication is given in a hospital or clinic. It will not be stored at home. NOTE:  This sheet is a summary. It may not cover all possible information. If you have questions about this medicine, talk to your doctor, pharmacist, or health care provider.  2024 Elsevier/Gold Standard (2021-11-28 00:00:00) Zoledronic Acid Injection (Cancer) What is this medication? ZOLEDRONIC ACID (ZOE le dron ik AS id) treats high calcium levels in the blood caused by cancer. It may also be used with chemotherapy to treat weakened bones caused by cancer. It works by slowing down the release of calcium from bones. This lowers calcium levels in your blood. It also makes your bones stronger and less likely to break (fracture). It belongs to a group of medications called bisphosphonates. This medicine may be used for other purposes; ask your health care provider or pharmacist if you have questions. COMMON BRAND NAME(S): Zometa, Zometa Powder What should I tell my care team before I take this medication? They need to know if you have any of these conditions: Dehydration Dental disease Kidney disease Liver disease Low levels of calcium in the blood Lung or breathing disease, such as asthma Receiving steroids, such as dexamethasone or prednisone An unusual or allergic reaction to zoledronic acid, other medications, foods, dyes, or preservatives Pregnant or trying to get pregnant Breast-feeding How should I use this medication? This medication is injected into a vein. It is given by your care team in a hospital or clinic setting. Talk to your care team about the use of this medication in children. Special care may be needed. Overdosage: If you think you have taken too much of this medicine contact a poison control center or emergency room at once. NOTE: This medicine is only for you. Do not share this medicine with others. What if I miss a dose? Keep appointments for follow-up doses. It is important not to miss your dose. Call your care team if you are unable to keep an appointment. What may  interact with this medication? Certain antibiotics given by injection Diuretics, such as bumetanide, furosemide NSAIDs, medications for pain and inflammation, such as ibuprofen or naproxen Teriparatide Thalidomide This list may not describe all possible interactions. Give your health care provider a list of all the medicines, herbs, non-prescription drugs, or dietary supplements you use. Also tell them if you smoke, drink alcohol, or use illegal drugs. Some items may interact with your medicine. What should I watch for while using this medication? Visit your care team for regular checks on your progress. It may be some time before you see the benefit from this medication. Some people who take this medication have severe bone, joint, or muscle pain. This medication may also increase your risk for jaw problems or a broken thigh bone. Tell your care team right away if you have severe pain in your jaw, bones, joints, or muscles. Tell you care team if you have any pain that does not go away or that gets worse. Tell your dentist and dental surgeon that you are taking this medication. You should not have major dental  surgery while on this medication. See your dentist to have a dental exam and fix any dental problems before starting this medication. Take good care of your teeth while on this medication. Make sure you see your dentist for regular follow-up appointments. You should make sure you get enough calcium and vitamin D while you are taking this medication. Discuss the foods you eat and the vitamins you take with your care team. Check with your care team if you have severe diarrhea, nausea, and vomiting, or if you sweat a lot. The loss of too much body fluid may make it dangerous for you to take this medication. You may need bloodwork while taking this medication. Talk to your care team if you wish to become pregnant or think you might be pregnant. This medication can cause serious birth defects. What  side effects may I notice from receiving this medication? Side effects that you should report to your care team as soon as possible: Allergic reactions--skin rash, itching, hives, swelling of the face, lips, tongue, or throat Kidney injury--decrease in the amount of urine, swelling of the ankles, hands, or feet Low calcium level--muscle pain or cramps, confusion, tingling, or numbness in the hands or feet Osteonecrosis of the jaw--pain, swelling, or redness in the mouth, numbness of the jaw, poor healing after dental work, unusual discharge from the mouth, visible bones in the mouth Severe bone, joint, or muscle pain Side effects that usually do not require medical attention (report to your care team if they continue or are bothersome): Constipation Fatigue Fever Loss of appetite Nausea Stomach pain This list may not describe all possible side effects. Call your doctor for medical advice about side effects. You may report side effects to FDA at 1-800-FDA-1088. Where should I keep my medication? This medication is given in a hospital or clinic. It will not be stored at home. NOTE: This sheet is a summary. It may not cover all possible information. If you have questions about this medicine, talk to your doctor, pharmacist, or health care provider.  2024 Elsevier/Gold Standard (2021-09-15 00:00:00)

## 2023-07-11 ENCOUNTER — Telehealth: Payer: Self-pay | Admitting: *Deleted

## 2023-07-11 NOTE — Telephone Encounter (Signed)
TCT patient regarding her Lenlidomide. Spoke with her. Advised that I had received a message from on call service from [facility] that pt had cancelled her delivery of this medication. Pt states she did not speak to anyone last night about this and that she is still agreeable now to start taking this.  Advised that I would contact [facility] to make sure this prescription remains in their system and will shipped to pt. Pt voiced understanding. TCT [facility].  Spoke with customer service and was able to have this medication put back in the system. They will all pt when the medication is ready to be shipped.

## 2023-07-14 ENCOUNTER — Other Ambulatory Visit: Payer: Self-pay | Admitting: Nurse Practitioner

## 2023-07-14 DIAGNOSIS — G893 Neoplasm related pain (acute) (chronic): Secondary | ICD-10-CM

## 2023-07-14 DIAGNOSIS — C9000 Multiple myeloma not having achieved remission: Secondary | ICD-10-CM

## 2023-07-14 DIAGNOSIS — Z515 Encounter for palliative care: Secondary | ICD-10-CM

## 2023-07-16 ENCOUNTER — Other Ambulatory Visit: Payer: Self-pay | Admitting: Hematology and Oncology

## 2023-07-22 ENCOUNTER — Telehealth: Payer: Self-pay | Admitting: *Deleted

## 2023-07-22 NOTE — Telephone Encounter (Signed)
Received message from on call service that pt had developed a rash/hives on her face but that it cleared up later on. TCT patient to see how she is doing. No answer but able to leave vm message for to call back if she continues to have problems with rash/hives. Pt recently started Revlimid (last week)

## 2023-07-23 ENCOUNTER — Inpatient Hospital Stay (HOSPITAL_BASED_OUTPATIENT_CLINIC_OR_DEPARTMENT_OTHER): Payer: Medicare Other | Admitting: Hematology and Oncology

## 2023-07-23 ENCOUNTER — Inpatient Hospital Stay: Payer: Medicare Other

## 2023-07-23 VITALS — BP 146/68 | HR 84 | Temp 97.9°F | Resp 16 | Wt 126.4 lb

## 2023-07-23 DIAGNOSIS — C9000 Multiple myeloma not having achieved remission: Secondary | ICD-10-CM | POA: Diagnosis not present

## 2023-07-23 DIAGNOSIS — E1122 Type 2 diabetes mellitus with diabetic chronic kidney disease: Secondary | ICD-10-CM | POA: Diagnosis not present

## 2023-07-23 DIAGNOSIS — Z79899 Other long term (current) drug therapy: Secondary | ICD-10-CM | POA: Diagnosis not present

## 2023-07-23 DIAGNOSIS — Z5112 Encounter for antineoplastic immunotherapy: Secondary | ICD-10-CM | POA: Diagnosis not present

## 2023-07-23 LAB — CBC WITH DIFFERENTIAL (CANCER CENTER ONLY)
Abs Immature Granulocytes: 0.03 10*3/uL (ref 0.00–0.07)
Basophils Absolute: 0 10*3/uL (ref 0.0–0.1)
Basophils Relative: 0 %
Eosinophils Absolute: 0.1 10*3/uL (ref 0.0–0.5)
Eosinophils Relative: 2 %
HCT: 41 % (ref 36.0–46.0)
Hemoglobin: 14.2 g/dL (ref 12.0–15.0)
Immature Granulocytes: 0 %
Lymphocytes Relative: 5 %
Lymphs Abs: 0.5 10*3/uL — ABNORMAL LOW (ref 0.7–4.0)
MCH: 33.6 pg (ref 26.0–34.0)
MCHC: 34.6 g/dL (ref 30.0–36.0)
MCV: 97.2 fL (ref 80.0–100.0)
Monocytes Absolute: 0.2 10*3/uL (ref 0.1–1.0)
Monocytes Relative: 2 %
Neutro Abs: 7.9 10*3/uL — ABNORMAL HIGH (ref 1.7–7.7)
Neutrophils Relative %: 91 %
Platelet Count: 195 10*3/uL (ref 150–400)
RBC: 4.22 MIL/uL (ref 3.87–5.11)
RDW: 12.7 % (ref 11.5–15.5)
WBC Count: 8.8 10*3/uL (ref 4.0–10.5)
nRBC: 0 % (ref 0.0–0.2)

## 2023-07-23 LAB — CMP (CANCER CENTER ONLY)
ALT: 11 U/L (ref 0–44)
AST: 15 U/L (ref 15–41)
Albumin: 4.3 g/dL (ref 3.5–5.0)
Alkaline Phosphatase: 63 U/L (ref 38–126)
Anion gap: 9 (ref 5–15)
BUN: 10 mg/dL (ref 8–23)
CO2: 26 mmol/L (ref 22–32)
Calcium: 9 mg/dL (ref 8.9–10.3)
Chloride: 106 mmol/L (ref 98–111)
Creatinine: 0.62 mg/dL (ref 0.44–1.00)
GFR, Estimated: >60 mL/min (ref >60)
Glucose, Bld: 145 mg/dL — ABNORMAL HIGH (ref 70–99)
Potassium: 3.6 mmol/L (ref 3.5–5.1)
Sodium: 141 mmol/L (ref 135–145)
Total Bilirubin: 0.6 mg/dL (ref <1.2)
Total Protein: 6.1 g/dL — ABNORMAL LOW (ref 6.5–8.1)

## 2023-07-23 MED ORDER — MONTELUKAST SODIUM 10 MG PO TABS: 10.0000 mg | ORAL_TABLET | Freq: Once | ORAL | Status: DC

## 2023-07-23 MED ORDER — DARATUMUMAB-HYALURONIDASE-FIHJ 1800-30000 MG-UT/15ML ~~LOC~~ SOLN
1800.0000 mg | Freq: Once | SUBCUTANEOUS | Status: AC
Start: 2023-07-23 — End: 2023-07-23
  Administered 2023-07-23: 1800 mg via SUBCUTANEOUS
  Filled 2023-07-23: qty 15

## 2023-07-23 MED ORDER — ACETAMINOPHEN 325 MG PO TABS
650.0000 mg | ORAL_TABLET | Freq: Once | ORAL | Status: DC
Start: 2023-07-23 — End: 2023-07-23

## 2023-07-23 MED ORDER — DIPHENHYDRAMINE HCL 25 MG PO CAPS: 25.0000 mg | ORAL_CAPSULE | Freq: Once | ORAL | Status: DC

## 2023-07-23 NOTE — Patient Instructions (Signed)
 [facility] - A DEPT OF [facility]  Discharge Instructions: Thank you for choosing [facility] to provide your oncology and hematology care.   If you have a lab appointment with the Cancer Center, please go directly to the Cancer Center and check in at the registration area.   Wear comfortable clothing and clothing appropriate for easy access to any Portacath or PICC line.   We strive to give you quality time with your provider. You may need to reschedule your appointment if you arrive late (15 or more minutes).  Arriving late affects you and other patients whose appointments are after yours.  Also, if you miss three or more appointments without notifying the office, you may be dismissed from the clinic at the provider's discretion.      For prescription refill requests, have your pharmacy contact our office and allow 72 hours for refills to be completed.    Today you received the following chemotherapy and/or immunotherapy agent: Daratumumab and Hyluronidase (Darzalex Faspro)      To help prevent nausea and vomiting after your treatment, we encourage you to take your nausea medication as directed.  BELOW ARE SYMPTOMS THAT SHOULD BE REPORTED IMMEDIATELY: *FEVER GREATER THAN 100.4 F (38 C) OR HIGHER *CHILLS OR SWEATING *NAUSEA AND VOMITING THAT IS NOT CONTROLLED WITH YOUR NAUSEA MEDICATION *UNUSUAL SHORTNESS OF BREATH *UNUSUAL BRUISING OR BLEEDING *URINARY PROBLEMS (pain or burning when urinating, or frequent urination) *BOWEL PROBLEMS (unusual diarrhea, constipation, pain near the anus) TENDERNESS IN MOUTH AND THROAT WITH OR WITHOUT PRESENCE OF ULCERS (sore throat, sores in mouth, or a toothache) UNUSUAL RASH, SWELLING OR PAIN  UNUSUAL VAGINAL DISCHARGE OR ITCHING   Items with * indicate a potential emergency and should be followed up as soon as possible or go to the Emergency Department if any problems should occur.  Please show the  CHEMOTHERAPY ALERT CARD or IMMUNOTHERAPY ALERT CARD at check-in to the Emergency Department and triage nurse.  Should you have questions after your visit or need to cancel or reschedule your appointment, please contact [facility] - A DEPT OF [facility]  Dept: [phone]  and follow the prompts.  Office hours are 8:00 a.m. to 4:30 p.m. Monday - Friday. Please note that voicemails left after 4:00 p.m. may not be returned until the following business day.  We are closed weekends and major holidays. You have access to a nurse at all times for urgent questions. Please call the main number to the clinic Dept: [phone] and follow the prompts.   For any non-urgent questions, you may also contact your provider using MyChart. We now offer e-Visits for anyone 18 and older to request care online for non-urgent symptoms. For details visit [email or web address].   Also download the MyChart app! Go to the app store, search "MyChart", open the app, select [facility], and log in with your MyChart username and password.  Daratumumab; Hyaluronidase Injection What is this medication? DARATUMUMAB; HYALURONIDASE (dar a toom ue mab; hye al ur ON i dase) treats multiple myeloma, a type of bone marrow cancer. Daratumumab works by blocking a protein that causes cancer cells to grow and multiply. This helps to slow or stop the spread of cancer cells. Hyaluronidase works by increasing the absorption of other medications in the body to help them work better. This medication may also be used treat amyloidosis, a condition that causes the buildup of a protein (  amyloid) in your body. It works by reducing the buildup of this protein, which decreases symptoms. It is a combination medication that contains a monoclonal antibody. This medicine may be used for other purposes; ask your health care provider or pharmacist if you have questions. COMMON BRAND NAME(S): DARZALEX FASPRO What should I tell  my care team before I take this medication? They need to know if you have any of these conditions: Heart disease Infection, such as chickenpox, cold sores, herpes, hepatitis B Lung or breathing disease An unusual or allergic reaction to daratumumab, hyaluronidase, other medications, foods, dyes, or preservatives Pregnant or trying to get pregnant Breast-feeding How should I use this medication? This medication is injected under the skin. It is given by your care team in a hospital or clinic setting. Talk to your care team about the use of this medication in children. Special care may be needed. Overdosage: If you think you have taken too much of this medicine contact a poison control center or emergency room at once. NOTE: This medicine is only for you. Do not share this medicine with others. What if I miss a dose? Keep appointments for follow-up doses. It is important not to miss your dose. Call your care team if you are unable to keep an appointment. What may interact with this medication? Interactions have not been studied. This list may not describe all possible interactions. Give your health care provider a list of all the medicines, herbs, non-prescription drugs, or dietary supplements you use. Also tell them if you smoke, drink alcohol, or use illegal drugs. Some items may interact with your medicine. What should I watch for while using this medication? Your condition will be monitored carefully while you are receiving this medication. This medication can cause serious allergic reactions. To reduce your risk, your care team may give you other medication to take before receiving this one. Be sure to follow the directions from your care team. This medication can affect the results of blood tests to match your blood type. These changes can last for up to 6 months after the final dose. Your care team will do blood tests to match your blood type before you start treatment. Tell all of your  care team that you are being treated with this medication before receiving a blood transfusion. This medication can affect the results of some tests used to determine treatment response; extra tests may be needed to evaluate response. Talk to your care team if you wish to become pregnant or think you are pregnant. This medication can cause serious birth defects if taken during pregnancy and for 3 months after the last dose. A reliable form of contraception is recommended while taking this medication and for 3 months after the last dose. Talk to your care team about effective forms of contraception. Do not breast-feed while taking this medication. What side effects may I notice from receiving this medication? Side effects that you should report to your care team as soon as possible: Allergic reactions--skin rash, itching, hives, swelling of the face, lips, tongue, or throat Heart rhythm changes--fast or irregular heartbeat, dizziness, feeling faint or lightheaded, chest pain, trouble breathing Infection--fever, chills, cough, sore throat, wounds that don't heal, pain or trouble when passing urine, general feeling of discomfort or being unwell Infusion reactions--chest pain, shortness of breath or trouble breathing, feeling faint or lightheaded Sudden eye pain or change in vision such as blurry vision, seeing halos around lights, vision loss Unusual bruising or bleeding Side effects  that usually do not require medical attention (report to your care team if they continue or are bothersome): Constipation Diarrhea Fatigue Nausea Pain, tingling, or numbness in the hands or feet Swelling of the ankles, hands, or feet This list may not describe all possible side effects. Call your doctor for medical advice about side effects. You may report side effects to FDA at 1-800-FDA-1088. Where should I keep my medication? This medication is given in a hospital or clinic. It will not be stored at home. NOTE:  This sheet is a summary. It may not cover all possible information. If you have questions about this medicine, talk to your doctor, pharmacist, or health care provider.  2024 Elsevier/Gold Standard (2021-11-28 00:00:00) Zoledronic Acid Injection (Cancer) What is this medication? ZOLEDRONIC ACID (ZOE le dron ik AS id) treats high calcium levels in the blood caused by cancer. It may also be used with chemotherapy to treat weakened bones caused by cancer. It works by slowing down the release of calcium from bones. This lowers calcium levels in your blood. It also makes your bones stronger and less likely to break (fracture). It belongs to a group of medications called bisphosphonates. This medicine may be used for other purposes; ask your health care provider or pharmacist if you have questions. COMMON BRAND NAME(S): Zometa, Zometa Powder What should I tell my care team before I take this medication? They need to know if you have any of these conditions: Dehydration Dental disease Kidney disease Liver disease Low levels of calcium in the blood Lung or breathing disease, such as asthma Receiving steroids, such as dexamethasone or prednisone An unusual or allergic reaction to zoledronic acid, other medications, foods, dyes, or preservatives Pregnant or trying to get pregnant Breast-feeding How should I use this medication? This medication is injected into a vein. It is given by your care team in a hospital or clinic setting. Talk to your care team about the use of this medication in children. Special care may be needed. Overdosage: If you think you have taken too much of this medicine contact a poison control center or emergency room at once. NOTE: This medicine is only for you. Do not share this medicine with others. What if I miss a dose? Keep appointments for follow-up doses. It is important not to miss your dose. Call your care team if you are unable to keep an appointment. What may  interact with this medication? Certain antibiotics given by injection Diuretics, such as bumetanide, furosemide NSAIDs, medications for pain and inflammation, such as ibuprofen or naproxen Teriparatide Thalidomide This list may not describe all possible interactions. Give your health care provider a list of all the medicines, herbs, non-prescription drugs, or dietary supplements you use. Also tell them if you smoke, drink alcohol, or use illegal drugs. Some items may interact with your medicine. What should I watch for while using this medication? Visit your care team for regular checks on your progress. It may be some time before you see the benefit from this medication. Some people who take this medication have severe bone, joint, or muscle pain. This medication may also increase your risk for jaw problems or a broken thigh bone. Tell your care team right away if you have severe pain in your jaw, bones, joints, or muscles. Tell you care team if you have any pain that does not go away or that gets worse. Tell your dentist and dental surgeon that you are taking this medication. You should not have major dental  surgery while on this medication. See your dentist to have a dental exam and fix any dental problems before starting this medication. Take good care of your teeth while on this medication. Make sure you see your dentist for regular follow-up appointments. You should make sure you get enough calcium and vitamin D while you are taking this medication. Discuss the foods you eat and the vitamins you take with your care team. Check with your care team if you have severe diarrhea, nausea, and vomiting, or if you sweat a lot. The loss of too much body fluid may make it dangerous for you to take this medication. You may need bloodwork while taking this medication. Talk to your care team if you wish to become pregnant or think you might be pregnant. This medication can cause serious birth defects. What  side effects may I notice from receiving this medication? Side effects that you should report to your care team as soon as possible: Allergic reactions--skin rash, itching, hives, swelling of the face, lips, tongue, or throat Kidney injury--decrease in the amount of urine, swelling of the ankles, hands, or feet Low calcium level--muscle pain or cramps, confusion, tingling, or numbness in the hands or feet Osteonecrosis of the jaw--pain, swelling, or redness in the mouth, numbness of the jaw, poor healing after dental work, unusual discharge from the mouth, visible bones in the mouth Severe bone, joint, or muscle pain Side effects that usually do not require medical attention (report to your care team if they continue or are bothersome): Constipation Fatigue Fever Loss of appetite Nausea Stomach pain This list may not describe all possible side effects. Call your doctor for medical advice about side effects. You may report side effects to FDA at 1-800-FDA-1088. Where should I keep my medication? This medication is given in a hospital or clinic. It will not be stored at home. NOTE: This sheet is a summary. It may not cover all possible information. If you have questions about this medicine, talk to your doctor, pharmacist, or health care provider.  2024 Elsevier/Gold Standard (2021-09-15 00:00:00)

## 2023-07-23 NOTE — Progress Notes (Signed)
[facility] [phone]   Fax:(336) [phone]  PROGRESS NOTE  Patient Care Team: [name], MD as PCP - General (Family Medicine) Pickenpack-Cousar, [name], NP as Nurse Practitioner (Nurse Practitioner)  Hematological/Oncological History # Plasmacytoma of Thoracic Spine  # IgG Kappa Multiple myeloma 09/22/2022: Patient underwent MRI thoracic spine which showed acute or subacute T7, T10, and T11 compression fractures 10/10/2022: biopsy of compression fracture of T11 reveals a plasmacytoma 11/13/2022: establish care with Dr. Leonides Schanz  12/06/2022: bone marrow biopsy showed plasma cell myeloma involving 80% of the marrow.  12/24/2022: Cycle 1 Day 1 of Velcade/Dex 01/14/2023: Cycle 2 Day 1 of Velcade/Dex 02/04/2023: Cycle 3 Day 1 of Velcade/Dex 02/26/2023: Cycle 4 Day 1 of Velcade/Dex HELD due to ocular toxicity 03/04/2023: Cycle 1 Day 1 of Dara/Dex  04/01/2023: Cycle 2 Day 1 of Dara/Dex  04/29/2023: Cycle 3 Day 1 of Dara/Dex  05/27/2023: Cycle 4 Day 1 of Dara/Dex  06/24/2023: Cycle 5 Day 1 of Dara/Dex  07/22/2023: Cycle 6 Day 1 of Dara/Dex   Interval History:  Alice Anthony 82 y.o. female with medical history significant for newly diagnosed IgG kappa multiple myeloma who presents for a follow up visit. The patient's last visit was on 07/08/2023.  In the interim since the last visit she has continued on Dara/Dex therapy.   On exam today Alice Anthony is accompanied by her daughter today.  She reports she developed a rash shortly after starting the Revlimid.  She reports it was itchy but not particular painful.  It was covering her face at times as well as her chest.  She reports that she has always been known to have sensitive skin.  She has not tried any medication or applied any cream to this.  She reports that she has best been "ignoring it".  She notes she did take her steroids this morning 5 pills of the dexamethasone.  She notes that she is not having any major  effects as a result of her Darzalex or Revlimid other than this new rash.  She has been taking Senokot to help keep her bowels moving regularly.  Her appetite remains good and her energy is strong.  She reports that her eyelids continue to heal "slowly".  Overall she is willing and able to continue with chemotherapy at this time. She denies any fevers, chills, sweats, shortness of breath, chest pain or cough.  A full 10 point ROS is otherwise negative.   MEDICAL HISTORY:  Past Medical History:  Diagnosis Date   Anxiety    Arthritis    Asthma    Hx: of   Cancer (HCC)    Hx: of skin cancer   Diverticulosis    GERD (gastroesophageal reflux disease)    Hx: of   History of kidney stones    Hyperlipemia    Hypertension    Pre-diabetes     SURGICAL HISTORY: Past Surgical History:  Procedure Laterality Date   ABDOMINAL SURGERY  09/19/2012   Ruptured Diverticuli, Ostomy Placed.   BREAST BIOPSY  06/06/2012   breast biopsy   CATARACT EXTRACTION W/ INTRAOCULAR LENS IMPLANT Bilateral    COLONOSCOPY  03/06/2012   COLOSTOMY CLOSURE  12/25/2012   Dr Luisa Hart   COLOSTOMY CLOSURE N/A 12/25/2012   Procedure: COLOSTOMY CLOSURE;  Surgeon: Clovis Pu. Cornett, MD;  Location: [facility] OR;  Service: General;  Laterality: N/A;   EXTRACORPOREAL SHOCK WAVE LITHOTRIPSY Left 11/17/2018   Procedure: EXTRACORPOREAL SHOCK WAVE LITHOTRIPSY (ESWL);  Surgeon: Malen Gauze, MD;  Location: [facility];  Service: Urology;  Laterality: Left;   EYE SURGERY  10/19/1998   Hx: of cyst removed from left eye   KYPHOPLASTY N/A 10/10/2022   Procedure: KYPHOPLASTY Thoracic seven , Thoracic ten,Thoracic eleven;  Surgeon: Tressie Stalker, MD;  Location: [facility];  Service: Neurosurgery;  Laterality: N/A;   LAPAROTOMY N/A 09/19/2012   Procedure: EXPLORATORY LAPAROTOMY, sigmoid colectomy;  Surgeon: Clovis Pu. Cornett, MD;  Location: [facility];  Service: General;  Laterality: N/A;   SIGMOIDOSCOPY  01/11/1999   TONSILLECTOMY     TRIGGER  FINGER RELEASE  12/25/2010   TUBAL LIGATION  10/16/1985    SOCIAL HISTORY: Social History   Socioeconomic History   Marital status: Widowed    Spouse name: Not on file   Number of children: Not on file   Years of education: Not on file   Highest education level: Not on file  Occupational History   Not on file  Tobacco Use   Smoking status: Never   Smokeless tobacco: Never  Vaping Use   Vaping status: Never Used  Substance and Sexual Activity   Alcohol use: No   Drug use: No   Sexual activity: Not Currently  Other Topics Concern   Not on file  Social History Narrative   Not on file   Social Drivers of Health   Financial Resource Strain: Not on file  Food Insecurity: Not on file  Transportation Needs: Not on file  Physical Activity: Not on file  Stress: Not on file  Social Connections: Not on file  Intimate Partner Violence: Not on file    FAMILY HISTORY: Family History  Problem Relation Age of Onset   Diabetes Father    Alzheimer's disease Father    Basal cell carcinoma Mother        Metastatic    ALLERGIES:  is allergic to hydrocodone, vioxx [rofecoxib], codeine, doxycycline hyclate, and latex.  MEDICATIONS:  Current Outpatient Medications  Medication Sig Dispense Refill   acyclovir (ZOVIRAX) 400 MG tablet Take 1 tablet by mouth twice daily 180 tablet 0   ALPRAZolam (XANAX) 0.25 MG tablet Take 0.25 mg by mouth 3 (three) times daily as needed.     amLODipine (NORVASC) 5 MG tablet Take 5 mg by mouth daily.     aspirin 81 MG tablet Take 81 mg by mouth daily.     bacitracin-neomycin-polymyxin-hydrocortisone (CORTISPORIN) 1 % ophthalmic ointment Place 1 Application into the right eye 2 (two) times daily. 3.5 g 0   Cholecalciferol (VITAMIN D3) 2000 UNITS capsule Take 2,000 Units by mouth daily.     dexamethasone (DECADRON) 4 MG tablet Take 40 mg (10 tablets) by mouth once a week. 40 tablet 3   dextromethorphan-guaiFENesin (TUSSIN DM) 10-100 MG/5ML liquid Take 10  mLs by mouth every 4 (four) hours as needed for cough.     fluticasone (FLONASE) 50 MCG/ACT nasal spray Place 2 sprays into the nose at bedtime.     hydrOXYzine (ATARAX) 25 MG tablet Take 25 mg by mouth at bedtime.     lenalidomide (REVLIMID) 10 MG capsule Take 1 capsule (10 mg total) by mouth daily. Take for 21 days, then none for 7 days. Repeat every 28 days. [employer] Auth # [id number]; Date Obtained 07/08/23 21 capsule 0   Probiotic Product (ALIGN PO) Take by mouth daily.     simvastatin (ZOCOR) 20 MG tablet Take 20 mg by mouth every evening.     traMADol (ULTRAM) 50 MG tablet TAKE 1 TO 2 TABLETS BY  MOUTH EVERY 4 HOURS AS NEEDED 90 tablet 0   No current facility-administered medications for this visit.   Facility-Administered Medications Ordered in Other Visits  Medication Dose Route Frequency Provider Last Rate Last Admin   acetaminophen (TYLENOL) tablet 650 mg  650 mg Oral Once Jaci Standard, MD       diphenhydrAMINE (BENADRYL) capsule 25 mg  25 mg Oral Once Jaci Standard, MD       montelukast (SINGULAIR) tablet 10 mg  10 mg Oral Once Jaci Standard, MD        REVIEW OF SYSTEMS:   Constitutional: ( - ) fevers, ( - )  chills , ( - ) night sweats Eyes: ( - ) blurriness of vision, ( - ) double vision, ( - ) watery eyes Ears, nose, mouth, throat, and face: ( - ) mucositis, ( - ) sore throat Respiratory: ( - ) cough, ( - ) dyspnea, ( - ) wheezes Cardiovascular: ( - ) palpitation, ( - ) chest discomfort, ( - ) lower extremity swelling Gastrointestinal:  ( - ) nausea, ( - ) heartburn, ( - ) change in bowel habits Skin: ( - ) abnormal skin rashes Lymphatics: ( - ) new lymphadenopathy, ( - ) easy bruising Neurological: ( - ) numbness, ( - ) tingling, ( - ) new weaknesses Behavioral/Psych: ( - ) mood change, ( - ) new changes  All other systems were reviewed with the patient and are negative.  PHYSICAL EXAMINATION: ECOG PERFORMANCE STATUS: 1 - Symptomatic but completely  ambulatory  Vitals:   07/23/23 0953  BP: (!) 146/68  Pulse: 84  Resp: 16  Temp: 97.9 F (36.6 C)  SpO2: 98%      Filed Weights   07/23/23 0953  Weight: 126 lb 6.4 oz (57.3 kg)       GENERAL: Well-appearing elderly Caucasian female, alert, no distress and comfortable SKIN: skin color, texture, turgor are normal, no rashes or significant lesions EYES: conjunctiva are pink and non-injected, sclera clear.  LUNGS: clear to auscultation and percussion with normal breathing effort HEART: regular rate & rhythm and no murmurs and no lower extremity edema Musculoskeletal: no cyanosis of digits and no clubbing  PSYCH: alert & oriented x 3, fluent speech NEURO: no focal motor/sensory deficits  LABORATORY DATA:  I have reviewed the data as listed    Latest Ref Rng & Units 07/23/2023    9:07 AM 07/08/2023   11:12 AM 06/24/2023    1:48 PM  CBC  WBC 4.0 - 10.5 K/uL 8.8  6.9  8.5   Hemoglobin 12.0 - 15.0 g/dL 82.9  56.2  13.0   Hematocrit 36.0 - 46.0 % 41.0  42.3  41.3   Platelets 150 - 400 K/uL 195  200  229        Latest Ref Rng & Units 07/23/2023    9:07 AM 07/08/2023   11:12 AM 06/24/2023    1:48 PM  CMP  Glucose 70 - 99 mg/dL 865  784  696   BUN 8 - 23 mg/dL 10  11  15    Creatinine 0.44 - 1.00 mg/dL 2.95  2.84  1.32   Sodium 135 - 145 mmol/L 141  141  137   Potassium 3.5 - 5.1 mmol/L 3.6  3.7  3.8   Chloride 98 - 111 mmol/L 106  107  105   CO2 22 - 32 mmol/L 26  25  25    Calcium 8.9 - 10.3  mg/dL 9.0  9.1  9.1   Total Protein 6.5 - 8.1 g/dL 6.1  6.1  6.3   Total Bilirubin <1.2 mg/dL 0.6  0.6  0.6   Alkaline Phos 38 - 126 U/L 63  72  66   AST 15 - 41 U/L 15  14  14    ALT 0 - 44 U/L 11  11  10      Lab Results  Component Value Date   MPROTEIN 0.2 (H) 06/24/2023   MPROTEIN 0.2 (H) 05/27/2023   MPROTEIN 0.2 (H) 04/29/2023   Lab Results  Component Value Date   KPAFRELGTCHN 121.6 (H) 06/24/2023   KPAFRELGTCHN 153.3 (H) 05/27/2023   KPAFRELGTCHN 191.5 (H)  04/29/2023   LAMBDASER 2.1 (L) 06/24/2023   LAMBDASER 2.6 (L) 05/27/2023   LAMBDASER <1.5 (L) 04/29/2023   KAPLAMBRATIO 57.90 (H) 06/24/2023   KAPLAMBRATIO 58.96 (H) 05/27/2023   KAPLAMBRATIO Note: (A) 04/29/2023   Lab Results  Component Value Date   MPROTEIN 0.2 (H) 06/24/2023   MPROTEIN 0.2 (H) 05/27/2023   MPROTEIN 0.2 (H) 04/29/2023   Lab Results  Component Value Date   KPAFRELGTCHN 121.6 (H) 06/24/2023   KPAFRELGTCHN 153.3 (H) 05/27/2023   KPAFRELGTCHN 191.5 (H) 04/29/2023   LAMBDASER 2.1 (L) 06/24/2023   LAMBDASER 2.6 (L) 05/27/2023   LAMBDASER <1.5 (L) 04/29/2023   KAPLAMBRATIO 57.90 (H) 06/24/2023   KAPLAMBRATIO 58.96 (H) 05/27/2023   KAPLAMBRATIO Note: (A) 04/29/2023     RADIOGRAPHIC STUDIES: No results found.  ASSESSMENT & PLAN WILLIAM TORSIELLO 82 y.o. female with medical history significant for IgG kappa multiple myeloma who presents for a follow up visit.   # IgG Kappa Multiple Myeloma # Plasmacytoma of Spine  -- Patient diagnosed with plasmacytoma of the spine, subsequent bone marrow biopsy confirmed an IgG kappa multiple myeloma involving the bone marrow --start of Velcade/Dex Cycle 1 Day 1 on 12/24/2022. --Due to ocular toxicity, recommended to discontinue Velcade therapy and switch to Daratumumab.  Plan: -- Labs today show creatinine 0.62, white blood cell 8.8, Hgb 14.2, MCV 97.2, Plt 195 --Last M protein 0.2 on 06/24/2023 with kappa free light chains of  121.6, lambda 2.1, and ratio of 57.90 --Started Revlimid on 07/17/2023 at 10 mg PO daily.  -- Return to clinic in 2 weeks with continued Dara/Rev/Dex.   #Ocular toxicity/Stye --Secondary to Velcade therapy --Used Moxifloxacin drops with improvement --Received azithromycin x 5 days.   #Supportive Care -- chemotherapy education complete -- port placement not required   -- zofran 8mg  q8H PRN and compazine 10mg  PO q6H for nausea -- acyclovir 400mg  PO BID for VCZ prophylaxis -- Recieved dental clearance  prior to the start of Xgeva/Zometa. First dose on 01/07/2023. Last performed on 07/08/2023. Next dose in March 2025  -- Tramadol 50 mg every 6 hours as needed for pain control  No orders of the defined types were placed in this encounter.  All questions were answered. The patient knows to call the clinic with any problems, questions or concerns.  I have spent a total of 30 minutes minutes of face-to-face and non-face-to-face time, preparing to see the patient, performing a medically appropriate examination, counseling and educating the patient,  documenting clinical information in the electronic health record,and care coordination.   Ulysees Barns, MD Department of Hematology/Oncology [facility] Phone: [phone] Pager: [phone] Email: [email or web address]   07/23/2023 3:43 PM

## 2023-07-23 NOTE — Progress Notes (Signed)
Patient took premedications at home prior to arrival to infusion this morning.

## 2023-07-24 LAB — KAPPA/LAMBDA LIGHT CHAINS
Kappa free light chain: 111.6 mg/L — ABNORMAL HIGH (ref 3.3–19.4)
Kappa, lambda light chain ratio: 41.33 — ABNORMAL HIGH (ref 0.26–1.65)
Lambda free light chains: 2.7 mg/L — ABNORMAL LOW (ref 5.7–26.3)

## 2023-08-01 NOTE — Progress Notes (Signed)
 Palliative Medicine [facility]  Telephone:(336) [phone] Fax:(336) [phone]   Name: Alice Anthony Date: 08/01/2023 MRN: [id number]  DOB: [birth year]  Patient Care Team: Verena Mems, MD as PCP - General (Family Medicine) Pickenpack-Cousar, Fannie SAILOR, NP as Nurse Practitioner (Nurse Practitioner)    INTERVAL HISTORY: Alice Anthony is a 82 y.o. female with oncologic medical history including recent diagnosis of multiple myeloma (12/2022), DM, and posterior vitreous detachment of left and right eyes. Palliative ask to see for symptom management and goals of care   SOCIAL HISTORY:     reports that she has never smoked. She has never used smokeless tobacco. She reports that she does not drink alcohol and does not use drugs.  ADVANCE DIRECTIVES:  Advanced directives on file  CODE STATUS: Full code  PAST MEDICAL HISTORY: Past Medical History:  Diagnosis Date   Anxiety    Arthritis    Asthma    Hx: of   Cancer (HCC)    Hx: of skin cancer   Diverticulosis    GERD (gastroesophageal reflux disease)    Hx: of   History of kidney stones    Hyperlipemia    Hypertension    Pre-diabetes     ALLERGIES:  is allergic to hydrocodone , vioxx [rofecoxib], codeine, doxycycline hyclate, and latex.  MEDICATIONS:  Current Outpatient Medications  Medication Sig Dispense Refill   acyclovir  (ZOVIRAX ) 400 MG tablet Take 1 tablet by mouth twice daily 180 tablet 0   ALPRAZolam  (XANAX ) 0.25 MG tablet Take 0.25 mg by mouth 3 (three) times daily as needed.     amLODipine  (NORVASC ) 5 MG tablet Take 5 mg by mouth daily.     aspirin  81 MG tablet Take 81 mg by mouth daily.     bacitracin-neomycin -polymyxin-hydrocortisone (CORTISPORIN) 1 % ophthalmic ointment Place 1 Application into the right eye 2 (two) times daily. 3.5 g 0   Cholecalciferol (VITAMIN D3) 2000 UNITS capsule Take 2,000 Units by mouth daily.     dexamethasone  (DECADRON ) 4 MG tablet Take 40 mg (10 tablets) by mouth  once a week. 40 tablet 3   dextromethorphan-guaiFENesin  (TUSSIN DM) 10-100 MG/5ML liquid Take 10 mLs by mouth every 4 (four) hours as needed for cough.     fluticasone  (FLONASE ) 50 MCG/ACT nasal spray Place 2 sprays into the nose at bedtime.     hydrOXYzine  (ATARAX ) 25 MG tablet Take 25 mg by mouth at bedtime.     lenalidomide  (REVLIMID ) 10 MG capsule Take 1 capsule (10 mg total) by mouth daily. Take for 21 days, then none for 7 days. Repeat every 28 days. [employer] Auth # [id number]; Date Obtained 07/08/23 21 capsule 0   Probiotic Product (ALIGN PO) Take by mouth daily.     simvastatin  (ZOCOR ) 20 MG tablet Take 20 mg by mouth every evening.     traMADol  (ULTRAM ) 50 MG tablet TAKE 1 TO 2 TABLETS BY MOUTH EVERY 4 HOURS AS NEEDED 90 tablet 0   No current facility-administered medications for this visit.    VITAL SIGNS: There were no vitals taken for this visit. There were no vitals filed for this visit.  Estimated body mass index is 23.88 kg/m as calculated from the following:   Height as of 06/24/23: 5' 1 (1.549 m).   Weight as of 07/23/23: 126 lb 6.4 oz (57.3 kg).   PERFORMANCE STATUS (ECOG) : 2 - Symptomatic, <50% confined to bed   Physical Exam General: NAD Cardiovascular: regular rate and rhythm Pulmonary: normal  breathing pattern  Extremities: no edema, no joint deformities Skin: no rashes Neurological: AAO x4  Discussed the use of AI scribe software for clinical note transcription with the patient, who gave verbal consent to proceed.   IMPRESSION:  Alice Anthony presents to clinic for follow-up. No acute distress. Her daughter is present. Denies nausea, vomiting, or constipation. Taking things one day at a time. She desires to be as active as possible. Tolerating Revlimid  however with some noticeable diarrhea and reports medications makes her very tired, and she has adjusted her medication schedule to take advantage of this side effect, taking the medication in the evening to aid  sleep. She reports sleeping well at bedtime, which she finds beneficial. Appetite is good. Some weight gain over the holiday. Expresses concern about her blood sugar levels, which have been running high. She attributes this to the steroids she has been taking and expresses a desire to return to a stricter dietary regimen to manage her blood sugar.  The patient has some concerns about gastrointestinal side effects from, Revlimid . She reports that the medication tends to upset her stomach, leading to frequent bowel movements, sometimes up to four or five times a day. The patient describes these movements as incomplete and unpredictable, causing significant distress and impacting her quality of life. She expresses concern about the potential impact of these symptoms on future plans, such as a family beach trip. Ms. Mcsorley previously was managing symptoms of constipation with Senokot 1 tablet twice daily. She is concerned about becoming constipation with elimination or inclusion of antidiarrheals. Recommended decreasing Senna to once daily while closely monitoring bowel pattern.  Education provided on the possibility of using Imodium to slow down the bowel movements if continued concerns in the future. Also will discontinue use of Senna completely if continues to have loose stools over during off period of Revlimid . She and daughter verbalized understanding. Patient assured bowel pattern will be regulated and hopes to not interfere with family trip over the summer.  In addition to these gastrointestinal issues, the patient reports some ongoing back pain, which she attributes to a combination of her stomach issues and the after-effects of major surgery. She notes that the pain tends to subside when she lies down, and she has been trying to balance activity with rest to manage this discomfort. We discussed taking things slow and steady while listening to her body. She has tramadol  on hand for pain as needed.   All  questions answered and support provided.  Goals of Care  5/20- We discussed her current illness and what it means in the larger context of her on-going co-morbidities. Natural disease trajectory and expectations were discussed.   Patient and daughter are realistic in their understanding of current illness. Is remaining hopeful for stability and improve quality of life.   We discussed Her current illness and what it means in the larger context of Her on-going co-morbidities. Natural disease trajectory and expectations were discussed.  I discussed the importance of continued conversation with family and their medical providers regarding overall plan of care and treatment options, ensuring decisions are within the context of the patients values and GOCs.  PLAN:  Cancer Treatment Side Effects Gastrointestinal upset and frequent bowel movements with Revlimid . Patient also reports fatigue and memory issues potentially related to Benadryl  premedication for infusions. -Adjust Senokot regimen to once daily due to Revlimid 's laxative effect. If loose stools remain persistent will discontinue all together.  -Consider use of Imodium to regulate bowel movements if  frequency remains high. -Continue Revlimid  as prescribed per Oncology.   Hyperglycemia Patient reports elevated blood sugars, potentially related to steroid use. -Encourage patient to monitor blood glucose levels regularly. -Advise patient to resume previous dietary restrictions to control blood sugar.  Back Pain Patient reports back pain, potentially related to previous surgery or constipation. -Encourage patient to take breaks and rest as needed. -Continue monitoring pain levels and consider further evaluation if pain persists or worsens. -Tramadol  as needed.   Follow-up -I will plan to see patient back in 4-6 weeks in collaboration with Oncology visits. She knows to contact office sooner if needed.  Patient expressed understanding  and was in agreement with this plan. She also understands that She can call the clinic at any time with any questions, concerns, or complaints.   Any controlled substances utilized were prescribed in the context of palliative care. PDMP has been reviewed.   Visit consisted of counseling and education dealing with the complex and emotionally intense issues of symptom management and palliative care in the setting of serious and potentially life-threatening illness.  Levon Borer, AGPCNP-BC  Palliative Medicine Team/ Cancer Center

## 2023-08-04 LAB — MULTIPLE MYELOMA PANEL, SERUM
Albumin SerPl Elph-Mcnc: 3.7 g/dL (ref 2.9–4.4)
Albumin/Glob SerPl: 1.6 (ref 0.7–1.7)
Alpha 1: 0.3 g/dL (ref 0.0–0.4)
Alpha2 Glob SerPl Elph-Mcnc: 0.8 g/dL (ref 0.4–1.0)
B-Globulin SerPl Elph-Mcnc: 0.8 g/dL (ref 0.7–1.3)
Gamma Glob SerPl Elph-Mcnc: 0.4 g/dL (ref 0.4–1.8)
Globulin, Total: 2.4 g/dL (ref 2.2–3.9)
IgA: 9 mg/dL — ABNORMAL LOW (ref 64–422)
IgG (Immunoglobin G), Serum: 339 mg/dL — ABNORMAL LOW (ref 586–1602)
IgM (Immunoglobulin M), Srm: 15 mg/dL — ABNORMAL LOW (ref 26–217)
M Protein SerPl Elph-Mcnc: 0.2 g/dL — ABNORMAL HIGH
Total Protein ELP: 6.1 g/dL (ref 6.0–8.5)

## 2023-08-05 ENCOUNTER — Other Ambulatory Visit: Payer: Self-pay | Admitting: Hematology and Oncology

## 2023-08-05 ENCOUNTER — Other Ambulatory Visit: Payer: Self-pay | Admitting: *Deleted

## 2023-08-05 DIAGNOSIS — C9000 Multiple myeloma not having achieved remission: Secondary | ICD-10-CM

## 2023-08-05 MED ORDER — LENALIDOMIDE 10 MG PO CAPS: 10.0000 mg | ORAL_CAPSULE | Freq: Every day | ORAL | 0 refills | Status: DC

## 2023-08-06 ENCOUNTER — Encounter: Payer: Self-pay | Admitting: Nurse Practitioner

## 2023-08-06 ENCOUNTER — Inpatient Hospital Stay: Payer: Medicare Other

## 2023-08-06 ENCOUNTER — Inpatient Hospital Stay (HOSPITAL_BASED_OUTPATIENT_CLINIC_OR_DEPARTMENT_OTHER): Payer: Medicare Other | Admitting: Hematology and Oncology

## 2023-08-06 ENCOUNTER — Inpatient Hospital Stay (HOSPITAL_BASED_OUTPATIENT_CLINIC_OR_DEPARTMENT_OTHER): Payer: Medicare Other | Admitting: Nurse Practitioner

## 2023-08-06 VITALS — BP 149/73 | HR 70 | Temp 97.7°F | Resp 14 | Wt 129.6 lb

## 2023-08-06 DIAGNOSIS — G893 Neoplasm related pain (acute) (chronic): Secondary | ICD-10-CM

## 2023-08-06 DIAGNOSIS — Z79899 Other long term (current) drug therapy: Secondary | ICD-10-CM | POA: Diagnosis not present

## 2023-08-06 DIAGNOSIS — Z515 Encounter for palliative care: Secondary | ICD-10-CM | POA: Diagnosis not present

## 2023-08-06 DIAGNOSIS — C9000 Multiple myeloma not having achieved remission: Secondary | ICD-10-CM

## 2023-08-06 DIAGNOSIS — R197 Diarrhea, unspecified: Secondary | ICD-10-CM | POA: Diagnosis not present

## 2023-08-06 DIAGNOSIS — Z5112 Encounter for antineoplastic immunotherapy: Secondary | ICD-10-CM | POA: Diagnosis not present

## 2023-08-06 DIAGNOSIS — R530 Neoplastic (malignant) related fatigue: Secondary | ICD-10-CM | POA: Diagnosis not present

## 2023-08-06 LAB — CBC WITH DIFFERENTIAL (CANCER CENTER ONLY)
Abs Immature Granulocytes: 0.02 10*3/uL (ref 0.00–0.07)
Basophils Absolute: 0 10*3/uL (ref 0.0–0.1)
Basophils Relative: 0 %
Eosinophils Absolute: 0.1 10*3/uL (ref 0.0–0.5)
Eosinophils Relative: 2 %
HCT: 38.5 % (ref 36.0–46.0)
Hemoglobin: 13.9 g/dL (ref 12.0–15.0)
Immature Granulocytes: 0 %
Lymphocytes Relative: 6 %
Lymphs Abs: 0.5 10*3/uL — ABNORMAL LOW (ref 0.7–4.0)
MCH: 34.6 pg — ABNORMAL HIGH (ref 26.0–34.0)
MCHC: 36.1 g/dL — ABNORMAL HIGH (ref 30.0–36.0)
MCV: 95.8 fL (ref 80.0–100.0)
Monocytes Absolute: 0.3 10*3/uL (ref 0.1–1.0)
Monocytes Relative: 4 %
Neutro Abs: 6.6 10*3/uL (ref 1.7–7.7)
Neutrophils Relative %: 88 %
Platelet Count: 160 10*3/uL (ref 150–400)
RBC: 4.02 MIL/uL (ref 3.87–5.11)
RDW: 12.7 % (ref 11.5–15.5)
WBC Count: 7.5 10*3/uL (ref 4.0–10.5)
nRBC: 0 % (ref 0.0–0.2)

## 2023-08-06 LAB — CMP (CANCER CENTER ONLY)
ALT: 12 U/L (ref 0–44)
AST: 15 U/L (ref 15–41)
Albumin: 4.2 g/dL (ref 3.5–5.0)
Alkaline Phosphatase: 68 U/L (ref 38–126)
Anion gap: 7 (ref 5–15)
BUN: 11 mg/dL (ref 8–23)
CO2: 26 mmol/L (ref 22–32)
Calcium: 8.8 mg/dL — ABNORMAL LOW (ref 8.9–10.3)
Chloride: 107 mmol/L (ref 98–111)
Creatinine: 0.64 mg/dL (ref 0.44–1.00)
GFR, Estimated: >60 mL/min (ref >60)
Glucose, Bld: 134 mg/dL — ABNORMAL HIGH (ref 70–99)
Potassium: 3.3 mmol/L — ABNORMAL LOW (ref 3.5–5.1)
Sodium: 140 mmol/L (ref 135–145)
Total Bilirubin: 0.6 mg/dL (ref 0.0–1.2)
Total Protein: 6.2 g/dL — ABNORMAL LOW (ref 6.5–8.1)

## 2023-08-06 MED ORDER — DARATUMUMAB-HYALURONIDASE-FIHJ 1800-30000 MG-UT/15ML ~~LOC~~ SOLN
1800.0000 mg | Freq: Once | SUBCUTANEOUS | Status: AC
  Administered 2023-08-06: 1800 mg via SUBCUTANEOUS
  Filled 2023-08-06: qty 15

## 2023-08-06 NOTE — Progress Notes (Signed)
 Patient presents today for chemotherapy injection of Darzalex . Patient is in satisfactory condition with no new complaints voiced.  Vital signs are stable.  Labs reviewed by Dr. Federico during the office visit and all labs are within treatment parameters. Patient reports taking premeds of Benadryl , Tylenol , and Singular prior to arrival at 1047 today. Per patient also took dexamethasone  prior to arrival. We will proceed with treatment per MD orders.   Patient tolerated treatment well with no complaints voiced.  Patient left ambulatory in stable condition.  Vital signs stable at discharge.  Follow up as scheduled.

## 2023-08-06 NOTE — Patient Instructions (Signed)
 [facility] - A DEPT OF . [facility]  Discharge Instructions: Thank you for choosing [facility] to provide your oncology and hematology care.   If you have a lab appointment with the Cancer Center, please go directly to the Cancer Center and check in at the registration area.   Wear comfortable clothing and clothing appropriate for easy access to any Portacath or PICC line.   We strive to give you quality time with your provider. You may need to reschedule your appointment if you arrive late (15 or more minutes).  Arriving late affects you and other patients whose appointments are after yours.  Also, if you miss three or more appointments without notifying the office, you may be dismissed from the clinic at the provider's discretion.      For prescription refill requests, have your pharmacy contact our office and allow 72 hours for refills to be completed.    Today you received the following chemotherapy and/or immunotherapy agents Darzalex .  Daratumumab  Injection What is this medication? DARATUMUMAB  (dar a toom ue mab) treats multiple myeloma, a type of bone marrow cancer. It works by helping your immune system slow or stop the spread of cancer cells. It is a monoclonal antibody. This medicine may be used for other purposes; ask your health care provider or pharmacist if you have questions. COMMON BRAND NAME(S): DARZALEX  What should I tell my care team before I take this medication? They need to know if you have any of these conditions: Hereditary fructose intolerance Infection, such as chickenpox, herpes, hepatitis B Lung or breathing disease, such as asthma, COPD An unusual or allergic reaction to daratumumab , sorbitol, other medications, foods, dyes, or preservatives Pregnant or trying to get pregnant Breastfeeding How should I use this medication? This medication is injected into a vein. It is given by your care team in a hospital or  clinic setting. Talk to your care team about the use of this medication in children. Special care may be needed. Overdosage: If you think you have taken too much of this medicine contact a poison control center or emergency room at once. NOTE: This medicine is only for you. Do not share this medicine with others. What if I miss a dose? Keep appointments for follow-up doses. It is important not to miss your dose. Call your care team if you are unable to keep an appointment. What may interact with this medication? Interactions have not been studied. This list may not describe all possible interactions. Give your health care provider a list of all the medicines, herbs, non-prescription drugs, or dietary supplements you use. Also tell them if you smoke, drink alcohol, or use illegal drugs. Some items may interact with your medicine. What should I watch for while using this medication? Your condition will be monitored carefully while you are receiving this medication. This medication can cause serious allergic reactions. To reduce your risk, your care team may give you other medication to take before receiving this one. Be sure to follow the directions from your care team. This medication can affect the results of blood tests to match your blood type. These changes can last for up to 6 months after the final dose. Your care team will do blood tests to match your blood type before you start treatment. Tell all of your care team that you are being treated with this medication before receiving a blood transfusion. This medication can affect the results of some tests  used to determine treatment response; extra tests may be needed to evaluate response. Talk to your care team if you wish to become pregnant or think you are pregnant. This medication can cause serious birth defects if taken during pregnancy and for 3 months after the last dose. A reliable form of contraception is recommended while taking this  medication and for 3 months after the last dose. Talk to your care team about effective forms of contraception. Do not breast-feed while taking this medication. What side effects may I notice from receiving this medication? Side effects that you should report to your care team as soon as possible: Allergic reactions--skin rash, itching, hives, swelling of the face, lips, tongue, or throat Infection--fever, chills, cough, sore throat, wounds that don't heal, pain or trouble when passing urine, general feeling of discomfort or being unwell Infusion reactions--chest pain, shortness of breath or trouble breathing, feeling faint or lightheaded Unusual bruising or bleeding Side effects that usually do not require medical attention (report to your care team if they continue or are bothersome): Constipation Diarrhea Fatigue Nausea Pain, tingling, or numbness in the hands or feet Swelling of the ankles, hands, or feet This list may not describe all possible side effects. Call your doctor for medical advice about side effects. You may report side effects to FDA at 1-800-FDA-1088. Where should I keep my medication? This medication is given in a hospital or clinic. It will not be stored at home. NOTE: This sheet is a summary. It may not cover all possible information. If you have questions about this medicine, talk to your doctor, pharmacist, or health care provider.  2024 Elsevier/Gold Standard (2022-05-31 00:00:00)       To help prevent nausea and vomiting after your treatment, we encourage you to take your nausea medication as directed.  BELOW ARE SYMPTOMS THAT SHOULD BE REPORTED IMMEDIATELY: *FEVER GREATER THAN 100.4 F (38 C) OR HIGHER *CHILLS OR SWEATING *NAUSEA AND VOMITING THAT IS NOT CONTROLLED WITH YOUR NAUSEA MEDICATION *UNUSUAL SHORTNESS OF BREATH *UNUSUAL BRUISING OR BLEEDING *URINARY PROBLEMS (pain or burning when urinating, or frequent urination) *BOWEL PROBLEMS (unusual diarrhea,  constipation, pain near the anus) TENDERNESS IN MOUTH AND THROAT WITH OR WITHOUT PRESENCE OF ULCERS (sore throat, sores in mouth, or a toothache) UNUSUAL RASH, SWELLING OR PAIN  UNUSUAL VAGINAL DISCHARGE OR ITCHING   Items with * indicate a potential emergency and should be followed up as soon as possible or go to the Emergency Department if any problems should occur.  Please show the CHEMOTHERAPY ALERT CARD or IMMUNOTHERAPY ALERT CARD at check-in to the Emergency Department and triage nurse.  Should you have questions after your visit or need to cancel or reschedule your appointment, please contact [facility] - A DEPT OF [facility]  Dept: [phone]  and follow the prompts.  Office hours are 8:00 a.m. to 4:30 p.m. Monday - Friday. Please note that voicemails left after 4:00 p.m. may not be returned until the following business day.  We are closed weekends and major holidays. You have access to a nurse at all times for urgent questions. Please call the main number to the clinic Dept: [phone] and follow the prompts.   For any non-urgent questions, you may also contact your provider using MyChart. We now offer e-Visits for anyone 57 and older to request care online for non-urgent symptoms. For details visit [email or web address].   Also download the MyChart app! Go to the app store, search  MyChart, open the app, select [place], and log in with your MyChart username and password.

## 2023-08-06 NOTE — Progress Notes (Signed)
 [facility] [phone]   [phone]  PROGRESS NOTE  Patient Care Team: Verena Mems, MD as PCP - General (Family Medicine) Pickenpack-Cousar, Fannie SAILOR, NP as Nurse Practitioner (Nurse Practitioner)  Hematological/Oncological History # Plasmacytoma of Thoracic Spine  # IgG Kappa Multiple myeloma 09/22/2022: Patient underwent MRI thoracic spine which showed acute or subacute T7, T10, and T11 compression fractures 10/10/2022: biopsy of compression fracture of T11 reveals a plasmacytoma 11/13/2022: establish care with Dr. Federico  12/06/2022: bone marrow biopsy showed plasma cell myeloma involving 80% of the marrow.  12/24/2022: Cycle 1 Day 1 of Velcade /Dex 01/14/2023: Cycle 2 Day 1 of Velcade /Dex 02/04/2023: Cycle 3 Day 1 of Velcade /Dex 02/26/2023: Cycle 4 Day 1 of Velcade /Dex HELD due to ocular toxicity 03/04/2023: Cycle 1 Day 1 of Dara/Dex  04/01/2023: Cycle 2 Day 1 of Dara/Dex  04/29/2023: Cycle 3 Day 1 of Dara/Dex  05/27/2023: Cycle 4 Day 1 of Dara/Dex  06/24/2023: Cycle 5 Day 1 of Dara/Dex  07/23/2023: Cycle 6 Day 1 of Dara/Dex   Interval History:  Alice Anthony 82 y.o. female with medical history significant for newly diagnosed IgG kappa multiple myeloma who presents for a follow up visit. The patient's last visit was on 07/23/2023.  In the interim since the last visit she has continued on Dara/Dex therapy.   On exam today Alice Anthony is accompanied by her daughter today.  She reports she has had no major changes in her health in interim since her last visit.  She reports she is tolerating the Revlimid  therapy well without any difficulty.  Today is her last day before she starts her week off.  She reports that she does have occasional rash with the Revlimid  that comes and goes.  She reports that she applies hydrocortisone sometimes but for the most part she just waits for it to disappear.  It is not itchy or painful.  She reports that her Darzalex  shots  are going well and not causing any difficulty.  Her energy levels are strong and her appetite is good.  She has been having some occasional diarrhea but no nausea or vomiting.  Overall she is willing and able to continue with chemotherapy at this time. She denies any fevers, chills, sweats, shortness of breath, chest pain or cough.  A full 10 point ROS is otherwise negative.   MEDICAL HISTORY:  Past Medical History:  Diagnosis Date   Anxiety    Arthritis    Asthma    Hx: of   Cancer (HCC)    Hx: of skin cancer   Diverticulosis    GERD (gastroesophageal reflux disease)    Hx: of   History of kidney stones    Hyperlipemia    Hypertension    Pre-diabetes     SURGICAL HISTORY: Past Surgical History:  Procedure Laterality Date   ABDOMINAL SURGERY  09/19/2012   Ruptured Diverticuli, Ostomy Placed.   BREAST BIOPSY  06/06/2012   breast biopsy   CATARACT EXTRACTION W/ INTRAOCULAR LENS IMPLANT Bilateral    COLONOSCOPY  03/06/2012   COLOSTOMY CLOSURE  12/25/2012   Dr Vanderbilt   COLOSTOMY CLOSURE N/A 12/25/2012   Procedure: COLOSTOMY CLOSURE;  Surgeon: [name], MD;  Location: [facility] OR;  Service: General;  Laterality: N/A;   EXTRACORPOREAL SHOCK WAVE LITHOTRIPSY Left 11/17/2018   Procedure: EXTRACORPOREAL SHOCK WAVE LITHOTRIPSY (ESWL);  Surgeon: Sherrilee Belvie CROME, MD;  Location: [facility];  Service: Urology;  Laterality: Left;   EYE SURGERY  10/19/1998  Hx: of cyst removed from left eye   KYPHOPLASTY N/A 10/10/2022   Procedure: KYPHOPLASTY Thoracic seven , Thoracic ten,Thoracic eleven;  Surgeon: [name], MD;  Location: [facility] OR;  Service: Neurosurgery;  Laterality: N/A;   LAPAROTOMY N/A 09/19/2012   Procedure: EXPLORATORY LAPAROTOMY, sigmoid colectomy;  Surgeon: [name], MD;  Location: [facility];  Service: General;  Laterality: N/A;   SIGMOIDOSCOPY  01/11/1999   TONSILLECTOMY     TRIGGER FINGER RELEASE  12/25/2010   TUBAL LIGATION  10/16/1985    SOCIAL  HISTORY: Social History   Socioeconomic History   Marital status: Widowed    Spouse name: Not on file   Number of children: Not on file   Years of education: Not on file   Highest education level: Not on file  Occupational History   Not on file  Tobacco Use   Smoking status: Never   Smokeless tobacco: Never  Vaping Use   Vaping status: Never Used  Substance and Sexual Activity   Alcohol use: No   Drug use: No   Sexual activity: Not Currently  Other Topics Concern   Not on file  Social History Narrative   Not on file   Social Drivers of Health   Financial Resource Strain: Not on file  Food Insecurity: Not on file  Transportation Needs: Not on file  Physical Activity: Not on file  Stress: Not on file  Social Connections: Not on file  Intimate Partner Violence: Not on file    FAMILY HISTORY: Family History  Problem Relation Age of Onset   Diabetes Father    Alzheimer's disease Father    Basal cell carcinoma Mother        Metastatic    ALLERGIES:  is allergic to hydrocodone , vioxx [rofecoxib], codeine, doxycycline hyclate, and latex.  MEDICATIONS:  Current Outpatient Medications  Medication Sig Dispense Refill   acyclovir  (ZOVIRAX ) 400 MG tablet Take 1 tablet by mouth twice daily 180 tablet 0   ALPRAZolam  (XANAX ) 0.25 MG tablet Take 0.25 mg by mouth 3 (three) times daily as needed.     amLODipine  (NORVASC ) 5 MG tablet Take 5 mg by mouth daily.     aspirin  81 MG tablet Take 81 mg by mouth daily.     bacitracin-neomycin -polymyxin-hydrocortisone (CORTISPORIN) 1 % ophthalmic ointment Place 1 Application into the right eye 2 (two) times daily. 3.5 g 0   Cholecalciferol (VITAMIN D3) 2000 UNITS capsule Take 2,000 Units by mouth daily.     dexamethasone  (DECADRON ) 4 MG tablet Take 40 mg (10 tablets) by mouth once a week. 40 tablet 3   dextromethorphan-guaiFENesin  (TUSSIN DM) 10-100 MG/5ML liquid Take 10 mLs by mouth every 4 (four) hours as needed for cough.      fluticasone  (FLONASE ) 50 MCG/ACT nasal spray Place 2 sprays into the nose at bedtime.     hydrOXYzine  (ATARAX ) 25 MG tablet Take 25 mg by mouth at bedtime.     lenalidomide  (REVLIMID ) 10 MG capsule Take 1 capsule (10 mg total) by mouth daily. Take for 21 days, then none for 7 days. Repeat every 28 days. [employer] Auth # [id number]; Date Obtained 08/05/23 21 capsule 0   Probiotic Product (ALIGN PO) Take by mouth daily.     simvastatin  (ZOCOR ) 20 MG tablet Take 20 mg by mouth every evening.     traMADol  (ULTRAM ) 50 MG tablet TAKE 1 TO 2 TABLETS BY MOUTH EVERY 4 HOURS AS NEEDED 90 tablet 0   No current facility-administered medications for this  visit.   Facility-Administered Medications Ordered in Other Visits  Medication Dose Route Frequency Provider Last Rate Last Admin   daratumumab -hyaluronidase -fihj (DARZALEX  FASPRO) 1800-30000 MG-UT/15ML chemo SQ injection 1,800 mg  1,800 mg Subcutaneous Once Chevette Fee T IV, MD        REVIEW OF SYSTEMS:   Constitutional: ( - ) fevers, ( - )  chills , ( - ) night sweats Eyes: ( - ) blurriness of vision, ( - ) double vision, ( - ) watery eyes Ears, nose, mouth, throat, and face: ( - ) mucositis, ( - ) sore throat Respiratory: ( - ) cough, ( - ) dyspnea, ( - ) wheezes Cardiovascular: ( - ) palpitation, ( - ) chest discomfort, ( - ) lower extremity swelling Gastrointestinal:  ( - ) nausea, ( - ) heartburn, ( - ) change in bowel habits Skin: ( - ) abnormal skin rashes Lymphatics: ( - ) new lymphadenopathy, ( - ) easy bruising Neurological: ( - ) numbness, ( - ) tingling, ( - ) new weaknesses Behavioral/Psych: ( - ) mood change, ( - ) new changes  All other systems were reviewed with the patient and are negative.  PHYSICAL EXAMINATION: ECOG PERFORMANCE STATUS: 1 - Symptomatic but completely ambulatory  Vitals:   08/06/23 1045  BP: (!) 149/73  Pulse: 70  Resp: 14  Temp: 97.7 F (36.5 C)  SpO2: 98%       Filed Weights   08/06/23 1045  Weight:  129 lb 9.6 oz (58.8 kg)        GENERAL: Well-appearing elderly Caucasian female, alert, no distress and comfortable SKIN: skin color, texture, turgor are normal, no rashes or significant lesions EYES: conjunctiva are pink and non-injected, sclera clear.  LUNGS: clear to auscultation and percussion with normal breathing effort HEART: regular rate & rhythm and no murmurs and no lower extremity edema Musculoskeletal: no cyanosis of digits and no clubbing  PSYCH: alert & oriented x 3, fluent speech NEURO: no focal motor/sensory deficits  LABORATORY DATA:  I have reviewed the data as listed    Latest Ref Rng & Units 08/06/2023   10:13 AM 07/23/2023    9:07 AM 07/08/2023   11:12 AM  CBC  WBC 4.0 - 10.5 K/uL 7.5  8.8  6.9   Hemoglobin 12.0 - 15.0 g/dL 86.0  85.7  85.5   Hematocrit 36.0 - 46.0 % 38.5  41.0  42.3   Platelets 150 - 400 K/uL 160  195  200        Latest Ref Rng & Units 08/06/2023   10:13 AM 07/23/2023    9:07 AM 07/08/2023   11:12 AM  CMP  Glucose 70 - 99 mg/dL 865  854  772   BUN 8 - 23 mg/dL 11  10  11    Creatinine 0.44 - 1.00 mg/dL 9.35  9.37  9.30   Sodium 135 - 145 mmol/L 140  141  141   Potassium 3.5 - 5.1 mmol/L 3.3  3.6  3.7   Chloride 98 - 111 mmol/L 107  106  107   CO2 22 - 32 mmol/L 26  26  25    Calcium  8.9 - 10.3 mg/dL 8.8  9.0  9.1   Total Protein 6.5 - 8.1 g/dL 6.2  6.1  6.1   Total Bilirubin 0.0 - 1.2 mg/dL 0.6  0.6  0.6   Alkaline Phos 38 - 126 U/L 68  63  72   AST 15 - 41 U/L 15  15  14   ALT 0 - 44 U/L 12  11  11      Lab Results  Component Value Date   MPROTEIN 0.2 (H) 07/23/2023   MPROTEIN 0.2 (H) 06/24/2023   MPROTEIN 0.2 (H) 05/27/2023   Lab Results  Component Value Date   KPAFRELGTCHN 111.6 (H) 07/23/2023   KPAFRELGTCHN 121.6 (H) 06/24/2023   KPAFRELGTCHN 153.3 (H) 05/27/2023   LAMBDASER 2.7 (L) 07/23/2023   LAMBDASER 2.1 (L) 06/24/2023   LAMBDASER 2.6 (L) 05/27/2023   KAPLAMBRATIO 41.33 (H) 07/23/2023   KAPLAMBRATIO 57.90 (H)  06/24/2023   KAPLAMBRATIO 58.96 (H) 05/27/2023   Lab Results  Component Value Date   MPROTEIN 0.2 (H) 07/23/2023   MPROTEIN 0.2 (H) 06/24/2023   MPROTEIN 0.2 (H) 05/27/2023   Lab Results  Component Value Date   KPAFRELGTCHN 111.6 (H) 07/23/2023   KPAFRELGTCHN 121.6 (H) 06/24/2023   KPAFRELGTCHN 153.3 (H) 05/27/2023   LAMBDASER 2.7 (L) 07/23/2023   LAMBDASER 2.1 (L) 06/24/2023   LAMBDASER 2.6 (L) 05/27/2023   KAPLAMBRATIO 41.33 (H) 07/23/2023   KAPLAMBRATIO 57.90 (H) 06/24/2023   KAPLAMBRATIO 58.96 (H) 05/27/2023     RADIOGRAPHIC STUDIES: No results found.  ASSESSMENT & PLAN Alice Anthony 82 y.o. female with medical history significant for IgG kappa multiple myeloma who presents for a follow up visit.   # IgG Kappa Multiple Myeloma # Plasmacytoma of Spine  -- Patient diagnosed with plasmacytoma of the spine, subsequent bone marrow biopsy confirmed an IgG kappa multiple myeloma involving the bone marrow --start of Velcade /Dex Cycle 1 Day 1 on 12/24/2022. --Due to ocular toxicity, recommended to discontinue Velcade  therapy and switch to Daratumumab .  Plan: -- Labs today show creatinine 0.64, white blood cell 7.5, Hgb 13.9, MCV 95.8, Plt 160 --Last M protein 0.2 on 07/23/2023 with kappa free light chains of  111.6, lambda 2.7, and ratio of 41.33 --Started Revlimid  on 07/17/2023 at 10 mg PO daily.  -- Return to clinic in 2 weeks with continued Dara/Rev/Dex.   #Ocular toxicity/Stye --Secondary to Velcade  therapy --Used Moxifloxacin  drops with improvement --Received azithromycin  x 5 days.   #Supportive Care -- chemotherapy education complete -- port placement not required   -- zofran  8mg  q8H PRN and compazine  10mg  PO q6H for nausea -- acyclovir  400mg  PO BID for VCZ prophylaxis -- Recieved dental clearance prior to the start of Xgeva/Zometa . First dose on 01/07/2023. Last performed on 07/08/2023. Next dose in March 2025  -- Tramadol  50 mg every 6 hours as needed for pain  control  No orders of the defined types were placed in this encounter.  All questions were answered. The patient knows to call the clinic with any problems, questions or concerns.  I have spent a total of 30 minutes minutes of face-to-face and non-face-to-face time, preparing to see the patient, performing a medically appropriate examination, counseling and educating the patient,  documenting clinical information in the electronic health record,and care coordination.   Norleen IVAR Kidney, MD Department of Hematology/Oncology [facility] Phone: [phone] Pager: [phone] Email: [email or web address]   08/06/2023 12:05 PM

## 2023-08-20 ENCOUNTER — Inpatient Hospital Stay (HOSPITAL_BASED_OUTPATIENT_CLINIC_OR_DEPARTMENT_OTHER): Payer: Medicare Other | Admitting: Hematology and Oncology

## 2023-08-20 ENCOUNTER — Inpatient Hospital Stay: Payer: Medicare Other | Attending: Hematology and Oncology

## 2023-08-20 VITALS — BP 141/72 | HR 72 | Temp 97.9°F | Resp 14 | Wt 126.4 lb

## 2023-08-20 DIAGNOSIS — C9000 Multiple myeloma not having achieved remission: Secondary | ICD-10-CM

## 2023-08-20 DIAGNOSIS — Z515 Encounter for palliative care: Secondary | ICD-10-CM

## 2023-08-20 DIAGNOSIS — Z5112 Encounter for antineoplastic immunotherapy: Secondary | ICD-10-CM | POA: Insufficient documentation

## 2023-08-20 DIAGNOSIS — G893 Neoplasm related pain (acute) (chronic): Secondary | ICD-10-CM

## 2023-08-20 DIAGNOSIS — Z79899 Other long term (current) drug therapy: Secondary | ICD-10-CM | POA: Insufficient documentation

## 2023-08-20 LAB — CMP (CANCER CENTER ONLY)
ALT: 11 U/L (ref 0–44)
AST: 15 U/L (ref 15–41)
Albumin: 4.3 g/dL (ref 3.5–5.0)
Alkaline Phosphatase: 54 U/L (ref 38–126)
Anion gap: 6 (ref 5–15)
BUN: 11 mg/dL (ref 8–23)
CO2: 28 mmol/L (ref 22–32)
Calcium: 9.2 mg/dL (ref 8.9–10.3)
Chloride: 107 mmol/L (ref 98–111)
Creatinine: 0.72 mg/dL (ref 0.44–1.00)
GFR, Estimated: >60 mL/min (ref >60)
Glucose, Bld: 193 mg/dL — ABNORMAL HIGH (ref 70–99)
Potassium: 4.3 mmol/L (ref 3.5–5.1)
Sodium: 141 mmol/L (ref 135–145)
Total Bilirubin: 0.6 mg/dL (ref 0.0–1.2)
Total Protein: 6.1 g/dL — ABNORMAL LOW (ref 6.5–8.1)

## 2023-08-20 LAB — CBC WITH DIFFERENTIAL (CANCER CENTER ONLY)
Abs Immature Granulocytes: 0.01 10*3/uL (ref 0.00–0.07)
Basophils Absolute: 0.2 10*3/uL — ABNORMAL HIGH (ref 0.0–0.1)
Basophils Relative: 3 %
Eosinophils Absolute: 0.2 10*3/uL (ref 0.0–0.5)
Eosinophils Relative: 4 %
HCT: 43 % (ref 36.0–46.0)
Hemoglobin: 14.6 g/dL (ref 12.0–15.0)
Immature Granulocytes: 0 %
Lymphocytes Relative: 13 %
Lymphs Abs: 0.7 10*3/uL (ref 0.7–4.0)
MCH: 33 pg (ref 26.0–34.0)
MCHC: 34 g/dL (ref 30.0–36.0)
MCV: 97.3 fL (ref 80.0–100.0)
Monocytes Absolute: 0.4 10*3/uL (ref 0.1–1.0)
Monocytes Relative: 8 %
Neutro Abs: 3.6 10*3/uL (ref 1.7–7.7)
Neutrophils Relative %: 72 %
Platelet Count: 207 10*3/uL (ref 150–400)
RBC: 4.42 MIL/uL (ref 3.87–5.11)
RDW: 12.7 % (ref 11.5–15.5)
WBC Count: 5.1 10*3/uL (ref 4.0–10.5)
nRBC: 0 % (ref 0.0–0.2)

## 2023-08-20 MED ORDER — DIPHENHYDRAMINE HCL 25 MG PO CAPS
25.0000 mg | ORAL_CAPSULE | Freq: Once | ORAL | Status: DC
Start: 2023-08-20 — End: 2023-08-20

## 2023-08-20 MED ORDER — ACETAMINOPHEN 325 MG PO TABS
650.0000 mg | ORAL_TABLET | Freq: Once | ORAL | Status: DC
Start: 2023-08-20 — End: 2023-08-20

## 2023-08-20 MED ORDER — TRAMADOL HCL 50 MG PO TABS: 50.0000 mg | ORAL_TABLET | ORAL | 0 refills | Status: DC | PRN

## 2023-08-20 MED ORDER — MONTELUKAST SODIUM 10 MG PO TABS: 10.0000 mg | ORAL_TABLET | Freq: Once | ORAL | Status: DC

## 2023-08-20 MED ORDER — DARATUMUMAB-HYALURONIDASE-FIHJ 1800-30000 MG-UT/15ML ~~LOC~~ SOLN
1800.0000 mg | Freq: Once | SUBCUTANEOUS | Status: AC
Start: 2023-08-20 — End: 2023-08-20
  Administered 2023-08-20: 1800 mg via SUBCUTANEOUS
  Filled 2023-08-20: qty 15

## 2023-08-20 NOTE — Progress Notes (Signed)
 Pt informed this RN that she took dexamethasone at home prior to appts today and took Singulair, Tylenol, and Benadryl at 1050 today.

## 2023-08-20 NOTE — Progress Notes (Signed)
 [facility] Telephone:([phone]) [phone]   [phone]  PROGRESS NOTE  Patient Care Team: Verena Mems, MD as PCP - General (Family Medicine) Pickenpack-Cousar, Fannie SAILOR, NP as Nurse Practitioner (Nurse Practitioner)  Hematological/Oncological History # Plasmacytoma of Thoracic Spine  # IgG Kappa Multiple myeloma 09/22/2022: Patient underwent MRI thoracic spine which showed acute or subacute T7, T10, and T11 compression fractures 10/10/2022: biopsy of compression fracture of T11 reveals a plasmacytoma 11/13/2022: establish care with Dr. Federico  12/06/2022: bone marrow biopsy showed plasma cell myeloma involving 80% of the marrow.  12/24/2022: Cycle 1 Day 1 of Velcade /Dex 01/14/2023: Cycle 2 Day 1 of Velcade /Dex 02/04/2023: Cycle 3 Day 1 of Velcade /Dex 02/26/2023: Cycle 4 Day 1 of Velcade /Dex HELD due to ocular toxicity 03/04/2023: Cycle 1 Day 1 of Dara/Dex  04/01/2023: Cycle 2 Day 1 of Dara/Dex  04/29/2023: Cycle 3 Day 1 of Dara/Dex  05/27/2023: Cycle 4 Day 1 of Dara/Dex  06/24/2023: Cycle 5 Day 1 of Dara/Dex  07/23/2023: Cycle 6 Day 1 of Dara/Rev/Dex   Interval History:  Alice Anthony 83 y.o. female with medical history significant for newly diagnosed IgG kappa multiple myeloma who presents for a follow up visit. The patient's last visit was on 07/23/2023.  In the interim since the last visit she has continued on Dara/Dex therapy.   On exam today Alice Anthony is accompanied by her daughter today.  She reports she is still having some occasional problems with her eyes with them being swollen and dry on occasion.  She also reports that they can be watery in the morning they do have some discharge and they require being run under the hot water in the shower in order to relief.  She reports she was having some occasional cramps in her leg at night but when she increase potassium in her diet this improved markedly.  She also does her best to try to eat calcium  rich foods to  naturally boost her calcium  levels.  She reports her appetite is good and her energy levels have been strong.  She continues to have some pain on her left side.  She reports that the Revlimid  but slightly delayed during this last cycle but she has it now and is taking 10 mg p.o. daily.  She is tolerating it well.  She would like a refill on her tramadol  she is taking 2 of these per day in order to help with the left rib pain.  Overall she is willing and able to continue with chemotherapy at this time. She denies any fevers, chills, sweats, shortness of breath, chest pain or cough.  A full 10 point ROS is otherwise negative.   MEDICAL HISTORY:  Past Medical History:  Diagnosis Date   Anxiety    Arthritis    Asthma    Hx: of   Cancer (HCC)    Hx: of skin cancer   Diverticulosis    GERD (gastroesophageal reflux disease)    Hx: of   History of kidney stones    Hyperlipemia    Hypertension    Pre-diabetes     SURGICAL HISTORY: Past Surgical History:  Procedure Laterality Date   ABDOMINAL SURGERY  09/19/2012   Ruptured Diverticuli, Ostomy Placed.   BREAST BIOPSY  06/06/2012   breast biopsy   CATARACT EXTRACTION W/ INTRAOCULAR LENS IMPLANT Bilateral    COLONOSCOPY  03/06/2012   COLOSTOMY CLOSURE  12/25/2012   Dr Cornett   COLOSTOMY CLOSURE N/A 12/25/2012   Procedure: COLOSTOMY CLOSURE;  Surgeon: [name], MD;  Location: [facility] OR;  Service: General;  Laterality: N/A;   EXTRACORPOREAL SHOCK WAVE LITHOTRIPSY Left 11/17/2018   Procedure: EXTRACORPOREAL SHOCK WAVE LITHOTRIPSY (ESWL);  Surgeon: Sherrilee Belvie CROME, MD;  Location: [facility] ORS;  Service: Urology;  Laterality: Left;   EYE SURGERY  10/19/1998   Hx: of cyst removed from left eye   KYPHOPLASTY N/A 10/10/2022   Procedure: KYPHOPLASTY Thoracic seven , Thoracic ten,Thoracic eleven;  Surgeon: [name], MD;  Location: [facility] OR;  Service: Neurosurgery;  Laterality: N/A;   LAPAROTOMY N/A 09/19/2012   Procedure: EXPLORATORY  LAPAROTOMY, sigmoid colectomy;  Surgeon: [name], MD;  Location: [facility] ORS;  Service: General;  Laterality: N/A;   SIGMOIDOSCOPY  01/11/1999   TONSILLECTOMY     TRIGGER FINGER RELEASE  12/25/2010   TUBAL LIGATION  10/16/1985    SOCIAL HISTORY: Social History   Socioeconomic History   Marital status: Widowed    Spouse name: Not on file   Number of children: Not on file   Years of education: Not on file   Highest education level: Not on file  Occupational History   Not on file  Tobacco Use   Smoking status: Never   Smokeless tobacco: Never  Vaping Use   Vaping status: Never Used  Substance and Sexual Activity   Alcohol use: No   Drug use: No   Sexual activity: Not Currently  Other Topics Concern   Not on file  Social History Narrative   Not on file   Social Drivers of Health   Financial Resource Strain: Not on file  Food Insecurity: Not on file  Transportation Needs: Not on file  Physical Activity: Not on file  Stress: Not on file  Social Connections: Not on file  Intimate Partner Violence: Not on file    FAMILY HISTORY: Family History  Problem Relation Age of Onset   Diabetes Father    Alzheimer's disease Father    Basal cell carcinoma Mother        Metastatic    ALLERGIES:  is allergic to hydrocodone , vioxx [rofecoxib], codeine, doxycycline hyclate, and latex.  MEDICATIONS:  Current Outpatient Medications  Medication Sig Dispense Refill   acyclovir  (ZOVIRAX ) 400 MG tablet Take 1 tablet by mouth twice daily 180 tablet 0   ALPRAZolam  (XANAX ) 0.25 MG tablet Take 0.25 mg by mouth 3 (three) times daily as needed.     amLODipine  (NORVASC ) 5 MG tablet Take 5 mg by mouth daily.     aspirin  81 MG tablet Take 81 mg by mouth daily.     bacitracin-neomycin -polymyxin-hydrocortisone (CORTISPORIN) 1 % ophthalmic ointment Place 1 Application into the right eye 2 (two) times daily. 3.5 g 0   Cholecalciferol (VITAMIN D3) 2000 UNITS capsule Take 2,000 Units by mouth  daily.     dexamethasone  (DECADRON ) 4 MG tablet Take 40 mg (10 tablets) by mouth once a week. 40 tablet 3   dextromethorphan-guaiFENesin  (TUSSIN DM) 10-100 MG/5ML liquid Take 10 mLs by mouth every 4 (four) hours as needed for cough.     fluticasone  (FLONASE ) 50 MCG/ACT nasal spray Place 2 sprays into the nose at bedtime.     hydrOXYzine  (ATARAX ) 25 MG tablet Take 25 mg by mouth at bedtime.     lenalidomide  (REVLIMID ) 10 MG capsule Take 1 capsule (10 mg total) by mouth daily. Take for 21 days, then none for 7 days. Repeat every 28 days. [employer] Auth # [id number]; Date Obtained 08/05/23 21 capsule 0   Probiotic  Product (ALIGN PO) Take by mouth daily.     simvastatin  (ZOCOR ) 20 MG tablet Take 20 mg by mouth every evening.     traMADol  (ULTRAM ) 50 MG tablet Take 1-2 tablets (50-100 mg total) by mouth every 4 (four) hours as needed. 90 tablet 0   No current facility-administered medications for this visit.   Facility-Administered Medications Ordered in Other Visits  Medication Dose Route Frequency Provider Last Rate Last Admin   acetaminophen  (TYLENOL ) tablet 650 mg  650 mg Oral Once Keywon Mestre T IV, MD       diphenhydrAMINE  (BENADRYL ) capsule 25 mg  25 mg Oral Once Verneice Caspers T IV, MD       montelukast  (SINGULAIR ) tablet 10 mg  10 mg Oral Once Federico Norleen ONEIDA MADISON, MD        REVIEW OF SYSTEMS:   Constitutional: ( - ) fevers, ( - )  chills , ( - ) night sweats Eyes: ( - ) blurriness of vision, ( - ) double vision, ( - ) watery eyes Ears, nose, mouth, throat, and face: ( - ) mucositis, ( - ) sore throat Respiratory: ( - ) cough, ( - ) dyspnea, ( - ) wheezes Cardiovascular: ( - ) palpitation, ( - ) chest discomfort, ( - ) lower extremity swelling Gastrointestinal:  ( - ) nausea, ( - ) heartburn, ( - ) change in bowel habits Skin: ( - ) abnormal skin rashes Lymphatics: ( - ) new lymphadenopathy, ( - ) easy bruising Neurological: ( - ) numbness, ( - ) tingling, ( - ) new  weaknesses Behavioral/Psych: ( - ) mood change, ( - ) new changes  All other systems were reviewed with the patient and are negative.  PHYSICAL EXAMINATION: ECOG PERFORMANCE STATUS: 1 - Symptomatic but completely ambulatory  Vitals:   08/20/23 1039  BP: (!) 141/72  Pulse: 72  Resp: 14  Temp: 97.9 F (36.6 C)  SpO2: 98%        Filed Weights   08/20/23 1039  Weight: 126 lb 6.4 oz (57.3 kg)         GENERAL: Well-appearing elderly Caucasian female, alert, no distress and comfortable SKIN: skin color, texture, turgor are normal, no rashes or significant lesions EYES: conjunctiva are pink and non-injected, sclera clear.  LUNGS: clear to auscultation and percussion with normal breathing effort HEART: regular rate & rhythm and no murmurs and no lower extremity edema Musculoskeletal: no cyanosis of digits and no clubbing  PSYCH: alert & oriented x 3, fluent speech NEURO: no focal motor/sensory deficits  LABORATORY DATA:  I have reviewed the data as listed    Latest Ref Rng & Units 08/20/2023    9:51 AM 08/06/2023   10:13 AM 07/23/2023    9:07 AM  CBC  WBC 4.0 - 10.5 K/uL 5.1  7.5  8.8   Hemoglobin 12.0 - 15.0 g/dL 85.3  86.0  85.7   Hematocrit 36.0 - 46.0 % 43.0  38.5  41.0   Platelets 150 - 400 K/uL 207  160  195        Latest Ref Rng & Units 08/20/2023    9:51 AM 08/06/2023   10:13 AM 07/23/2023    9:07 AM  CMP  Glucose 70 - 99 mg/dL 806  865  854   BUN 8 - 23 mg/dL 11  11  10    Creatinine 0.44 - 1.00 mg/dL 9.27  9.35  9.37   Sodium 135 - 145 mmol/L 141  140  141   Potassium 3.5 - 5.1 mmol/L 4.3  3.3  3.6   Chloride 98 - 111 mmol/L 107  107  106   CO2 22 - 32 mmol/L 28  26  26    Calcium  8.9 - 10.3 mg/dL 9.2  8.8  9.0   Total Protein 6.5 - 8.1 g/dL 6.1  6.2  6.1   Total Bilirubin 0.0 - 1.2 mg/dL 0.6  0.6  0.6   Alkaline Phos 38 - 126 U/L 54  68  63   AST 15 - 41 U/L 15  15  15    ALT 0 - 44 U/L 11  12  11      Lab Results  Component Value Date    MPROTEIN 0.2 (H) 07/23/2023   MPROTEIN 0.2 (H) 06/24/2023   MPROTEIN 0.2 (H) 05/27/2023   Lab Results  Component Value Date   KPAFRELGTCHN 111.6 (H) 07/23/2023   KPAFRELGTCHN 121.6 (H) 06/24/2023   KPAFRELGTCHN 153.3 (H) 05/27/2023   LAMBDASER 2.7 (L) 07/23/2023   LAMBDASER 2.1 (L) 06/24/2023   LAMBDASER 2.6 (L) 05/27/2023   KAPLAMBRATIO 41.33 (H) 07/23/2023   KAPLAMBRATIO 57.90 (H) 06/24/2023   KAPLAMBRATIO 58.96 (H) 05/27/2023   Lab Results  Component Value Date   MPROTEIN 0.2 (H) 07/23/2023   MPROTEIN 0.2 (H) 06/24/2023   MPROTEIN 0.2 (H) 05/27/2023   Lab Results  Component Value Date   KPAFRELGTCHN 111.6 (H) 07/23/2023   KPAFRELGTCHN 121.6 (H) 06/24/2023   KPAFRELGTCHN 153.3 (H) 05/27/2023   LAMBDASER 2.7 (L) 07/23/2023   LAMBDASER 2.1 (L) 06/24/2023   LAMBDASER 2.6 (L) 05/27/2023   KAPLAMBRATIO 41.33 (H) 07/23/2023   KAPLAMBRATIO 57.90 (H) 06/24/2023   KAPLAMBRATIO 58.96 (H) 05/27/2023     RADIOGRAPHIC STUDIES: No results found.  ASSESSMENT & PLAN SHAKEETA GODETTE 83 y.o. female with medical history significant for IgG kappa multiple myeloma who presents for a follow up visit.   # IgG Kappa Multiple Myeloma # Plasmacytoma of Spine  -- Patient diagnosed with plasmacytoma of the spine, subsequent bone marrow biopsy confirmed an IgG kappa multiple myeloma involving the bone marrow --start of Velcade /Dex Cycle 1 Day 1 on 12/24/2022. --Due to ocular toxicity, recommended to discontinue Velcade  therapy and switch to Daratumumab .  Plan: -- Labs today show creatinine 0.72, WBC 5.1, hemoglobin 14.6, MCV 97.3, platelets 207 --Last M protein 0.2 on 07/23/2023 with kappa free light chains of  111.6, lambda 2.7, and ratio of 41.33 --Started Revlimid  on 07/17/2023 at 10 mg PO daily.  -- Return to clinic in 2 weeks with continued Dara/Rev/Dex.   #Ocular toxicity/Stye --Secondary to Velcade  therapy --Used Moxifloxacin  drops with improvement --Received azithromycin  x 5 days.    #Supportive Care -- chemotherapy education complete -- port placement not required   -- zofran  8mg  q8H PRN and compazine  10mg  PO q6H for nausea -- acyclovir  400mg  PO BID for VCZ prophylaxis -- Recieved dental clearance prior to the start of Xgeva/Zometa . First dose on 01/07/2023. Last performed on 07/08/2023. Next dose in March 2025  -- Tramadol  50 mg every 6 hours as needed for pain control.  Currently taking 2 daily.  Orders Placed This Encounter  Procedures   Multiple Myeloma Panel (SPEP&IFE w/QIG)    Standing Status:   Future    Expected Date:   09/16/2023    Expiration Date:   09/15/2024   Kappa/lambda light chains    Standing Status:   Future    Expected Date:   09/16/2023    Expiration Date:   09/15/2024  CMP (Cancer Center only)    Standing Status:   Future    Expected Date:   09/16/2023    Expiration Date:   09/15/2024   CBC with Differential (Cancer Center Only)    Standing Status:   Future    Expected Date:   09/16/2023    Expiration Date:   09/15/2024   Multiple Myeloma Panel (SPEP&IFE w/QIG)    Standing Status:   Future    Expected Date:   10/14/2023    Expiration Date:   10/13/2024   Kappa/lambda light chains    Standing Status:   Future    Expected Date:   10/14/2023    Expiration Date:   10/13/2024   CMP (Cancer Center only)    Standing Status:   Future    Expected Date:   10/14/2023    Expiration Date:   10/13/2024   CBC with Differential (Cancer Center Only)    Standing Status:   Future    Expected Date:   10/14/2023    Expiration Date:   10/13/2024   All questions were answered. The patient knows to call the clinic with any problems, questions or concerns.  I have spent a total of 30 minutes minutes of face-to-face and non-face-to-face time, preparing to see the patient, performing a medically appropriate examination, counseling and educating the patient,  documenting clinical information in the electronic health record,and care coordination.   Norleen IVAR Kidney,  MD Department of Hematology/Oncology [facility] Phone: [phone] Pager: [phone] Email: [email or web address]   08/20/2023 2:08 PM

## 2023-08-20 NOTE — Patient Instructions (Signed)

## 2023-08-21 LAB — KAPPA/LAMBDA LIGHT CHAINS
Kappa free light chain: 89 mg/L — ABNORMAL HIGH (ref 3.3–19.4)
Kappa, lambda light chain ratio: 29.67 — ABNORMAL HIGH (ref 0.26–1.65)
Lambda free light chains: 3 mg/L — ABNORMAL LOW (ref 5.7–26.3)

## 2023-08-23 ENCOUNTER — Telehealth: Payer: Self-pay | Admitting: *Deleted

## 2023-08-23 DIAGNOSIS — H10023 Other mucopurulent conjunctivitis, bilateral: Secondary | ICD-10-CM | POA: Diagnosis not present

## 2023-08-23 NOTE — Telephone Encounter (Signed)
Received call from pt's daughter. She states that Alice Anthony is c/o greenish yellow bloody drainage from her nose and eyes. They have contacted her opthamalogist and [name] is picking up some antibiotic eye drops. Pt has an appt with her eye doctor on Monday. Called the patient and spoke with her. She denies fever, chills, no cough or sore throat, no SOB. Advised pt to go to Urgent care if symptoms worsen over the weekend. Advised the same to her daughter. They both voiced understanding.

## 2023-08-26 DIAGNOSIS — H16141 Punctate keratitis, right eye: Secondary | ICD-10-CM | POA: Diagnosis not present

## 2023-08-27 LAB — MULTIPLE MYELOMA PANEL, SERUM
Albumin SerPl Elph-Mcnc: 3.7 g/dL (ref 2.9–4.4)
Albumin/Glob SerPl: 1.9 — ABNORMAL HIGH (ref 0.7–1.7)
Alpha 1: 0.2 g/dL (ref 0.0–0.4)
Alpha2 Glob SerPl Elph-Mcnc: 0.7 g/dL (ref 0.4–1.0)
B-Globulin SerPl Elph-Mcnc: 0.8 g/dL (ref 0.7–1.3)
Gamma Glob SerPl Elph-Mcnc: 0.3 g/dL — ABNORMAL LOW (ref 0.4–1.8)
Globulin, Total: 2 g/dL — ABNORMAL LOW (ref 2.2–3.9)
IgA: 13 mg/dL — ABNORMAL LOW (ref 64–422)
IgG (Immunoglobin G), Serum: 359 mg/dL — ABNORMAL LOW (ref 586–1602)
IgM (Immunoglobulin M), Srm: 17 mg/dL — ABNORMAL LOW (ref 26–217)
M Protein SerPl Elph-Mcnc: 0.1 g/dL — ABNORMAL HIGH
Total Protein ELP: 5.7 g/dL — ABNORMAL LOW (ref 6.0–8.5)

## 2023-09-02 ENCOUNTER — Encounter: Payer: Self-pay | Admitting: Hematology and Oncology

## 2023-09-03 ENCOUNTER — Other Ambulatory Visit: Payer: Self-pay | Admitting: Hematology and Oncology

## 2023-09-03 DIAGNOSIS — C9000 Multiple myeloma not having achieved remission: Secondary | ICD-10-CM

## 2023-09-04 ENCOUNTER — Other Ambulatory Visit: Payer: Self-pay | Admitting: Hematology and Oncology

## 2023-09-04 DIAGNOSIS — C9000 Multiple myeloma not having achieved remission: Secondary | ICD-10-CM

## 2023-09-05 ENCOUNTER — Other Ambulatory Visit: Payer: Self-pay | Admitting: *Deleted

## 2023-09-05 DIAGNOSIS — C9000 Multiple myeloma not having achieved remission: Secondary | ICD-10-CM

## 2023-09-05 MED ORDER — LENALIDOMIDE 10 MG PO CAPS: 10.0000 mg | ORAL_CAPSULE | Freq: Every day | ORAL | 0 refills | Status: DC

## 2023-09-10 ENCOUNTER — Encounter: Payer: Self-pay | Admitting: Hematology and Oncology

## 2023-09-11 ENCOUNTER — Encounter: Payer: Self-pay | Admitting: Hematology and Oncology

## 2023-09-12 ENCOUNTER — Other Ambulatory Visit: Payer: Self-pay | Admitting: Hematology and Oncology

## 2023-09-12 MED ORDER — ACYCLOVIR 400 MG PO TABS: 400.0000 mg | ORAL_TABLET | Freq: Two times a day (BID) | ORAL | 0 refills | Status: DC

## 2023-09-16 ENCOUNTER — Ambulatory Visit: Payer: Medicare Other | Admitting: Nurse Practitioner

## 2023-09-16 ENCOUNTER — Ambulatory Visit: Payer: Medicare Other

## 2023-09-16 ENCOUNTER — Other Ambulatory Visit: Payer: Medicare Other

## 2023-09-16 NOTE — Progress Notes (Deleted)
 Palliative Medicine [facility]  [phone] [phone]   Name: Alice Anthony Date: 09/16/2023 MRN: [id number]  DOB: February 20, 1941  Patient Care Team: [name], MD as PCP - General (Family Medicine) Pickenpack-Cousar, [name], NP as Nurse Practitioner (Nurse Practitioner)    INTERVAL HISTORY: Alice Anthony is a 83 y.o. female with oncologic medical history including recent diagnosis of multiple myeloma (12/2022), DM, and posterior vitreous detachment of left and right eyes. Palliative ask to see for symptom management and goals of care   SOCIAL HISTORY:     reports that she has never smoked. She has never used smokeless tobacco. She reports that she does not drink alcohol and does not use drugs.  ADVANCE DIRECTIVES:  Advanced directives on file  CODE STATUS: Full code  PAST MEDICAL HISTORY: Past Medical History:  Diagnosis Date  . Anxiety   . Arthritis   . Asthma    Hx: of  . Cancer (HCC)    Hx: of skin cancer  . Diverticulosis   . GERD (gastroesophageal reflux disease)    Hx: of  . History of kidney stones   . Hyperlipemia   . Hypertension   . Pre-diabetes     ALLERGIES:  is allergic to hydrocodone, vioxx [rofecoxib], codeine, doxycycline hyclate, and latex.  MEDICATIONS:  Current Outpatient Medications  Medication Sig Dispense Refill  . acyclovir (ZOVIRAX) 400 MG tablet Take 1 tablet (400 mg total) by mouth 2 (two) times daily. 180 tablet 0  . ALPRAZolam (XANAX) 0.25 MG tablet Take 0.25 mg by mouth 3 (three) times daily as needed.    Marland Kitchen amLODipine (NORVASC) 5 MG tablet Take 5 mg by mouth daily.    Marland Kitchen aspirin 81 MG tablet Take 81 mg by mouth daily.    . bacitracin-neomycin-polymyxin-hydrocortisone (CORTISPORIN) 1 % ophthalmic ointment Place 1 Application into the right eye 2 (two) times daily. 3.5 g 0  . Cholecalciferol (VITAMIN D3) 2000 UNITS capsule Take 2,000 Units by mouth daily.    Marland Kitchen dexamethasone (DECADRON) 4 MG  tablet Take 40 mg (10 tablets) by mouth once a week. 40 tablet 3  . dextromethorphan-guaiFENesin (TUSSIN DM) 10-100 MG/5ML liquid Take 10 mLs by mouth every 4 (four) hours as needed for cough.    . fluticasone (FLONASE) 50 MCG/ACT nasal spray Place 2 sprays into the nose at bedtime.    . hydrOXYzine (ATARAX) 25 MG tablet Take 25 mg by mouth at bedtime.    Marland Kitchen lenalidomide (REVLIMID) 10 MG capsule Take 1 capsule (10 mg total) by mouth daily. Take for 21 days, then none for 7 days.  [employer] Auth # [id number] Date Obtained 09/05/23 21 capsule 0  . Probiotic Product (ALIGN PO) Take by mouth daily.    . simvastatin (ZOCOR) 20 MG tablet Take 20 mg by mouth every evening.    . traMADol (ULTRAM) 50 MG tablet Take 1-2 tablets (50-100 mg total) by mouth every 4 (four) hours as needed. 90 tablet 0   No current facility-administered medications for this visit.    VITAL SIGNS: There were no vitals taken for this visit. There were no vitals filed for this visit.  Estimated body mass index is 23.88 kg/m as calculated from the following:   Height as of 06/24/23: 5\' 1"  (1.549 m).   Weight as of 08/20/23: 126 lb 6.4 oz (57.3 kg).   PERFORMANCE STATUS (ECOG) : 2 - Symptomatic, <50% confined to bed   Physical Exam General: NAD Cardiovascular: regular rate  and rhythm Pulmonary: normal breathing pattern  Extremities: no edema, no joint deformities Skin: no rashes Neurological: AAO x4  Discussed the use of AI scribe software for clinical note transcription with the patient, who gave verbal consent to proceed.   IMPRESSION:  Alice Anthony presents to clinic for follow-up. No acute distress. Her daughter is present. Denies nausea, vomiting, or constipation. Taking things one day at a time. She desires to be as active as possible. Tolerating Revlimid however with some noticeable diarrhea and reports medications makes her very tired, and she has adjusted her medication schedule to take advantage of this side  effect, taking the medication in the evening to aid sleep. She reports sleeping well at bedtime, which she finds beneficial. Appetite is good. Some weight gain over the holiday. Expresses concern about her blood sugar levels, which have been running high. She attributes this to the steroids she has been taking and expresses a desire to return to a stricter dietary regimen to manage her blood sugar.  The patient has some concerns about gastrointestinal side effects from, Revlimid. She reports that the medication tends to upset her stomach, leading to frequent bowel movements, sometimes up to four or five times a day. The patient describes these movements as incomplete and unpredictable, causing significant distress and impacting her quality of life. She expresses concern about the potential impact of these symptoms on future plans, such as a family beach trip. Alice Anthony previously was managing symptoms of constipation with Senokot 1 tablet twice daily. She is concerned about becoming constipation with elimination or inclusion of antidiarrheals. Recommended decreasing Senna to once daily while closely monitoring bowel pattern.  Education provided on the possibility of using Imodium to slow down the bowel movements if continued concerns in the future. Also will discontinue use of Senna completely if continues to have loose stools over during off period of Revlimid. She and daughter verbalized understanding. Patient assured bowel pattern will be regulated and hopes to not interfere with family trip over the summer.  In addition to these gastrointestinal issues, the patient reports some ongoing back pain, which she attributes to a combination of her stomach issues and the after-effects of major surgery. She notes that the pain tends to subside when she lies down, and she has been trying to balance activity with rest to manage this discomfort. We discussed taking things slow and steady while listening to her body.  She has tramadol on hand for pain as needed.   All questions answered and support provided.  Goals of Care  5/20- We discussed her current illness and what it means in the larger context of her on-going co-morbidities. Natural disease trajectory and expectations were discussed.   Patient and daughter are realistic in their understanding of current illness. Is remaining hopeful for stability and improve quality of life.   We discussed Her current illness and what it means in the larger context of Her on-going co-morbidities. Natural disease trajectory and expectations were discussed.  I discussed the importance of continued conversation with family and their medical providers regarding overall plan of care and treatment options, ensuring decisions are within the context of the patients values and GOCs.  PLAN:  Cancer Treatment Side Effects Gastrointestinal upset and frequent bowel movements with Revlimid. Patient also reports fatigue and memory issues potentially related to Benadryl premedication for infusions. -Adjust Senokot regimen to once daily due to Revlimid's laxative effect. If loose stools remain persistent will discontinue all together.  -Consider use of Imodium to  regulate bowel movements if frequency remains high. -Continue Revlimid as prescribed per Oncology.   Hyperglycemia Patient reports elevated blood sugars, potentially related to steroid use. -Encourage patient to monitor blood glucose levels regularly. -Advise patient to resume previous dietary restrictions to control blood sugar.  Back Pain Patient reports back pain, potentially related to previous surgery or constipation. -Encourage patient to take breaks and rest as needed. -Continue monitoring pain levels and consider further evaluation if pain persists or worsens. -Tramadol as needed.   Follow-up -I will plan to see patient back in 4-6 weeks in collaboration with Oncology visits. She knows to contact office  sooner if needed.  Patient expressed understanding and was in agreement with this plan. She also understands that She can call the clinic at any time with any questions, concerns, or complaints.   Any controlled substances utilized were prescribed in the context of palliative care. PDMP has been reviewed.   Visit consisted of counseling and education dealing with the complex and emotionally intense issues of symptom management and palliative care in the setting of serious and potentially life-threatening illness.  Willette Alma, AGPCNP-BC  Palliative Medicine Team/[facility]

## 2023-09-18 ENCOUNTER — Inpatient Hospital Stay: Payer: Medicare Other

## 2023-09-18 ENCOUNTER — Inpatient Hospital Stay: Payer: Medicare Other | Admitting: Nurse Practitioner

## 2023-09-18 ENCOUNTER — Inpatient Hospital Stay (HOSPITAL_BASED_OUTPATIENT_CLINIC_OR_DEPARTMENT_OTHER): Payer: Medicare Other | Admitting: Physician Assistant

## 2023-09-18 ENCOUNTER — Inpatient Hospital Stay: Payer: Medicare Other | Attending: Hematology and Oncology

## 2023-09-18 VITALS — BP 145/75 | HR 86 | Temp 97.8°F | Resp 17 | Wt 127.5 lb

## 2023-09-18 DIAGNOSIS — Z5112 Encounter for antineoplastic immunotherapy: Secondary | ICD-10-CM | POA: Insufficient documentation

## 2023-09-18 DIAGNOSIS — C9000 Multiple myeloma not having achieved remission: Secondary | ICD-10-CM | POA: Diagnosis not present

## 2023-09-18 LAB — CBC WITH DIFFERENTIAL (CANCER CENTER ONLY)
Abs Immature Granulocytes: 0.01 10*3/uL (ref 0.00–0.07)
Basophils Absolute: 0.1 10*3/uL (ref 0.0–0.1)
Basophils Relative: 2 %
Eosinophils Absolute: 0 10*3/uL (ref 0.0–0.5)
Eosinophils Relative: 1 %
HCT: 44.8 % (ref 36.0–46.0)
Hemoglobin: 15.4 g/dL — ABNORMAL HIGH (ref 12.0–15.0)
Immature Granulocytes: 0 %
Lymphocytes Relative: 9 %
Lymphs Abs: 0.4 10*3/uL — ABNORMAL LOW (ref 0.7–4.0)
MCH: 32.9 pg (ref 26.0–34.0)
MCHC: 34.4 g/dL (ref 30.0–36.0)
MCV: 95.7 fL (ref 80.0–100.0)
Monocytes Absolute: 0.1 10*3/uL (ref 0.1–1.0)
Monocytes Relative: 2 %
Neutro Abs: 3.5 10*3/uL (ref 1.7–7.7)
Neutrophils Relative %: 86 %
Platelet Count: 161 10*3/uL (ref 150–400)
RBC: 4.68 MIL/uL (ref 3.87–5.11)
RDW: 12.6 % (ref 11.5–15.5)
WBC Count: 4.1 10*3/uL (ref 4.0–10.5)
nRBC: 0 % (ref 0.0–0.2)

## 2023-09-18 LAB — CMP (CANCER CENTER ONLY)
ALT: 12 U/L (ref 0–44)
AST: 16 U/L (ref 15–41)
Albumin: 4.6 g/dL (ref 3.5–5.0)
Alkaline Phosphatase: 63 U/L (ref 38–126)
Anion gap: 7 (ref 5–15)
BUN: 13 mg/dL (ref 8–23)
CO2: 25 mmol/L (ref 22–32)
Calcium: 9.2 mg/dL (ref 8.9–10.3)
Chloride: 107 mmol/L (ref 98–111)
Creatinine: 0.7 mg/dL (ref 0.44–1.00)
GFR, Estimated: >60 mL/min (ref >60)
Glucose, Bld: 202 mg/dL — ABNORMAL HIGH (ref 70–99)
Potassium: 3.8 mmol/L (ref 3.5–5.1)
Sodium: 139 mmol/L (ref 135–145)
Total Bilirubin: 0.6 mg/dL (ref 0.0–1.2)
Total Protein: 6.7 g/dL (ref 6.5–8.1)

## 2023-09-18 MED ORDER — DARATUMUMAB-HYALURONIDASE-FIHJ 1800-30000 MG-UT/15ML ~~LOC~~ SOLN
1800.0000 mg | Freq: Once | SUBCUTANEOUS | Status: AC
  Administered 2023-09-18: 1800 mg via SUBCUTANEOUS
  Filled 2023-09-18: qty 15

## 2023-09-18 NOTE — Progress Notes (Signed)
[facility] [phone]   [phone]  PROGRESS NOTE  Patient Care Team: [name], MD as PCP - General (Family Medicine) Pickenpack-Cousar, [name], NP as Nurse Practitioner (Nurse Practitioner)  Hematological/Oncological History # Plasmacytoma of Thoracic Spine  # IgG Kappa Multiple myeloma 09/22/2022: Patient underwent MRI thoracic spine which showed acute or subacute T7, T10, and T11 compression fractures 10/10/2022: biopsy of compression fracture of T11 reveals a plasmacytoma 11/13/2022: establish care with Dr. Leonides Schanz  12/06/2022: bone marrow biopsy showed plasma cell myeloma involving 80% of the marrow.  12/24/2022: Cycle 1 Day 1 of Velcade/Dex 01/14/2023: Cycle 2 Day 1 of Velcade/Dex 02/04/2023: Cycle 3 Day 1 of Velcade/Dex 02/26/2023: Cycle 4 Day 1 of Velcade/Dex HELD due to ocular toxicity 03/04/2023: Cycle 1 Day 1 of Dara/Dex  04/01/2023: Cycle 2 Day 1 of Dara/Dex  04/29/2023: Cycle 3 Day 1 of Dara/Dex  05/27/2023: Cycle 4 Day 1 of Dara/Dex  06/24/2023: Cycle 5 Day 1 of Dara/Dex  07/23/2023: Cycle 6 Day 1 of Dara/Rev/Dex  08/20/2023: Cycle 7 Day 1 of Dara/Rev/Dex  09/18/2023 Cycle 8 Day 1 of Dara/Rev/Dex   Interval History:  Alice Anthony 83 y.o. female with medical history significant for newly diagnosed IgG kappa multiple myeloma who presents for a follow up visit. The patient's last visit was on 08/20/2023.  In the interim since the last visit she has continued on Dara/Rev/Dex therapy.   On exam today Alice Anthony is accompanied by her daughter today.  She reports she still has eye swelling/crusting in the morning and she uses Pataday daily and antihistamines which provides some relief. She is also being followed by an opthomalogist for this. She has had a pruritic rash on her face and chest since starting the Revlimid which she is managing with hydrocortisone cream. She said she has a good amount of energy, but she is limited by back pain. She  takes Tramadol and rests to help alleviate her back pain. She is getting about 10-11 hours of sleep nightly. She states her appetite has decreased some, but she continues to eat because she knows she needs to. She denies any bleeding, bruising, bowel changes, shortness of breath, chest pain, cough, fever/chills, nausea/vomiting, or night sweats.   Overall she is willing and able to continue with chemotherapy at this time.  A full 10 point ROS is otherwise negative.   MEDICAL HISTORY:  Past Medical History:  Diagnosis Date   Anxiety    Arthritis    Asthma    Hx: of   Cancer (HCC)    Hx: of skin cancer   Diverticulosis    GERD (gastroesophageal reflux disease)    Hx: of   History of kidney stones    Hyperlipemia    Hypertension    Pre-diabetes     SURGICAL HISTORY: Past Surgical History:  Procedure Laterality Date   ABDOMINAL SURGERY  09/19/2012   Ruptured Diverticuli, Ostomy Placed.   BREAST BIOPSY  06/06/2012   breast biopsy   CATARACT EXTRACTION W/ INTRAOCULAR LENS IMPLANT Bilateral    COLONOSCOPY  03/06/2012   COLOSTOMY CLOSURE  12/25/2012   Dr Luisa Hart   COLOSTOMY CLOSURE N/A 12/25/2012   Procedure: COLOSTOMY CLOSURE;  Surgeon: Clovis Pu. Cornett, MD;  Location: [facility] OR;  Service: General;  Laterality: N/A;   EXTRACORPOREAL SHOCK WAVE LITHOTRIPSY Left 11/17/2018   Procedure: EXTRACORPOREAL SHOCK WAVE LITHOTRIPSY (ESWL);  Surgeon: Malen Gauze, MD;  Location: [facility];  Service: Urology;  Laterality: Left;   EYE  SURGERY  10/19/1998   Hx: of cyst removed from left eye   KYPHOPLASTY N/A 10/10/2022   Procedure: KYPHOPLASTY Thoracic seven , Thoracic ten,Thoracic eleven;  Surgeon: Tressie Stalker, MD;  Location: [facility] OR;  Service: Neurosurgery;  Laterality: N/A;   LAPAROTOMY N/A 09/19/2012   Procedure: EXPLORATORY LAPAROTOMY, sigmoid colectomy;  Surgeon: Clovis Pu. Cornett, MD;  Location: [facility];  Service: General;  Laterality: N/A;   SIGMOIDOSCOPY  01/11/1999   TONSILLECTOMY      TRIGGER FINGER RELEASE  12/25/2010   TUBAL LIGATION  10/16/1985    SOCIAL HISTORY: Social History   Socioeconomic History   Marital status: Widowed    Spouse name: Not on file   Number of children: Not on file   Years of education: Not on file   Highest education level: Not on file  Occupational History   Not on file  Tobacco Use   Smoking status: Never   Smokeless tobacco: Never  Vaping Use   Vaping status: Never Used  Substance and Sexual Activity   Alcohol use: No   Drug use: No   Sexual activity: Not Currently  Other Topics Concern   Not on file  Social History Narrative   Not on file   Social Drivers of Health   Financial Resource Strain: Not on file  Food Insecurity: Not on file  Transportation Needs: Not on file  Physical Activity: Not on file  Stress: Not on file  Social Connections: Not on file  Intimate Partner Violence: Not on file    FAMILY HISTORY: Family History  Problem Relation Age of Onset   Diabetes Father    Alzheimer's disease Father    Basal cell carcinoma Mother        Metastatic    ALLERGIES:  is allergic to hydrocodone, vioxx [rofecoxib], codeine, doxycycline hyclate, and latex.  MEDICATIONS:  Current Outpatient Medications  Medication Sig Dispense Refill   acyclovir (ZOVIRAX) 400 MG tablet Take 1 tablet (400 mg total) by mouth 2 (two) times daily. 180 tablet 0   ALPRAZolam (XANAX) 0.25 MG tablet Take 0.25 mg by mouth 3 (three) times daily as needed.     amLODipine (NORVASC) 5 MG tablet Take 5 mg by mouth daily.     aspirin 81 MG tablet Take 81 mg by mouth daily.     bacitracin-neomycin-polymyxin-hydrocortisone (CORTISPORIN) 1 % ophthalmic ointment Place 1 Application into the right eye 2 (two) times daily. 3.5 g 0   Cholecalciferol (VITAMIN D3) 2000 UNITS capsule Take 2,000 Units by mouth daily.     dexamethasone (DECADRON) 4 MG tablet Take 40 mg (10 tablets) by mouth once a week. 40 tablet 3   dextromethorphan-guaiFENesin  (TUSSIN DM) 10-100 MG/5ML liquid Take 10 mLs by mouth every 4 (four) hours as needed for cough.     fluticasone (FLONASE) 50 MCG/ACT nasal spray Place 2 sprays into the nose at bedtime.     hydrOXYzine (ATARAX) 25 MG tablet Take 25 mg by mouth at bedtime.     lenalidomide (REVLIMID) 10 MG capsule Take 1 capsule (10 mg total) by mouth daily. Take for 21 days, then none for 7 days.  [employer] Auth # [id number] Date Obtained 09/05/23 21 capsule 0   levocetirizine (XYZAL) 5 MG tablet Take 5 mg by mouth every evening.     montelukast (SINGULAIR) 10 MG tablet Take 10 mg by mouth daily.     Probiotic Product (ALIGN PO) Take by mouth daily.     simvastatin (ZOCOR) 20 MG tablet Take  20 mg by mouth every evening.     traMADol (ULTRAM) 50 MG tablet Take 1-2 tablets (50-100 mg total) by mouth every 4 (four) hours as needed. 90 tablet 0   No current facility-administered medications for this visit.    REVIEW OF SYSTEMS:   Constitutional: ( - ) fevers, ( - )  chills , ( - ) night sweats Eyes: ( - ) blurriness of vision, ( - ) double vision, ( - ) watery eyes Ears, nose, mouth, throat, and face: ( - ) mucositis, ( - ) sore throat Respiratory: ( - ) cough, ( - ) dyspnea, ( - ) wheezes Cardiovascular: ( - ) palpitation, ( - ) chest discomfort, ( - ) lower extremity swelling Gastrointestinal:  ( - ) nausea, ( - ) heartburn, ( - ) change in bowel habits Skin: ( - ) abnormal skin rashes Lymphatics: ( - ) new lymphadenopathy, ( - ) easy bruising Neurological: ( - ) numbness, ( - ) tingling, ( - ) new weaknesses Behavioral/Psych: ( - ) mood change, ( - ) new changes  All other systems were reviewed with the patient and are negative.  PHYSICAL EXAMINATION: ECOG PERFORMANCE STATUS: 1 - Symptomatic but completely ambulatory  Vitals:   09/18/23 1323  BP: (!) 145/75  Pulse: 86  Resp: 17  Temp: 97.8 F (36.6 C)  SpO2: 96%        Filed Weights   09/18/23 1323  Weight: 127 lb 8 oz (57.8 kg)          GENERAL: Well-appearing elderly Caucasian female, alert, no distress and comfortable SKIN: skin color, texture, turgor are normal, no rashes or significant lesions EYES: conjunctiva are pink and non-injected, sclera clear.  LUNGS: clear to auscultation and percussion with normal breathing effort HEART: regular rate & rhythm and no murmurs and no lower extremity edema Musculoskeletal: no cyanosis of digits and no clubbing  PSYCH: alert & oriented x 3, fluent speech NEURO: no focal motor/sensory deficits  LABORATORY DATA:  I have reviewed the data as listed    Latest Ref Rng & Units 09/18/2023    1:02 PM 08/20/2023    9:51 AM 08/06/2023   10:13 AM  CBC  WBC 4.0 - 10.5 K/uL 4.1  5.1  7.5   Hemoglobin 12.0 - 15.0 g/dL 78.2  95.6  21.3   Hematocrit 36.0 - 46.0 % 44.8  43.0  38.5   Platelets 150 - 400 K/uL 161  207  160        Latest Ref Rng & Units 09/18/2023    1:02 PM 08/20/2023    9:51 AM 08/06/2023   10:13 AM  CMP  Glucose 70 - 99 mg/dL 086  578  469   BUN 8 - 23 mg/dL 13  11  11    Creatinine 0.44 - 1.00 mg/dL 6.29  5.28  4.13   Sodium 135 - 145 mmol/L 139  141  140   Potassium 3.5 - 5.1 mmol/L 3.8  4.3  3.3   Chloride 98 - 111 mmol/L 107  107  107   CO2 22 - 32 mmol/L 25  28  26    Calcium 8.9 - 10.3 mg/dL 9.2  9.2  8.8   Total Protein 6.5 - 8.1 g/dL 6.7  6.1  6.2   Total Bilirubin 0.0 - 1.2 mg/dL 0.6  0.6  0.6   Alkaline Phos 38 - 126 U/L 63  54  68   AST 15 - 41 U/L 16  15  15   ALT 0 - 44 U/L 12  11  12      Lab Results  Component Value Date   MPROTEIN 0.1 (H) 08/20/2023   MPROTEIN 0.2 (H) 07/23/2023   MPROTEIN 0.2 (H) 06/24/2023   Lab Results  Component Value Date   KPAFRELGTCHN 89.0 (H) 08/20/2023   KPAFRELGTCHN 111.6 (H) 07/23/2023   KPAFRELGTCHN 121.6 (H) 06/24/2023   LAMBDASER 3.0 (L) 08/20/2023   LAMBDASER 2.7 (L) 07/23/2023   LAMBDASER 2.1 (L) 06/24/2023   KAPLAMBRATIO 29.67 (H) 08/20/2023   KAPLAMBRATIO 41.33 (H) 07/23/2023    KAPLAMBRATIO 57.90 (H) 06/24/2023   Lab Results  Component Value Date   MPROTEIN 0.1 (H) 08/20/2023   MPROTEIN 0.2 (H) 07/23/2023   MPROTEIN 0.2 (H) 06/24/2023   Lab Results  Component Value Date   KPAFRELGTCHN 89.0 (H) 08/20/2023   KPAFRELGTCHN 111.6 (H) 07/23/2023   KPAFRELGTCHN 121.6 (H) 06/24/2023   LAMBDASER 3.0 (L) 08/20/2023   LAMBDASER 2.7 (L) 07/23/2023   LAMBDASER 2.1 (L) 06/24/2023   KAPLAMBRATIO 29.67 (H) 08/20/2023   KAPLAMBRATIO 41.33 (H) 07/23/2023   KAPLAMBRATIO 57.90 (H) 06/24/2023     RADIOGRAPHIC STUDIES: No results found.  ASSESSMENT & PLAN Alice Anthony 83 y.o. female with medical history significant for IgG kappa multiple myeloma who presents for a follow up visit.   # IgG Kappa Multiple Myeloma # Plasmacytoma of Spine  -- Patient diagnosed with plasmacytoma of the spine, subsequent bone marrow biopsy confirmed an IgG kappa multiple myeloma involving the bone marrow --start of Velcade/Dex Cycle 1 Day 1 on 12/24/2022. --Due to ocular toxicity, recommended to discontinue Velcade therapy and switch to Daratumumab.  --Started Revlimid on 07/17/2023 at 10 mg PO daily.  Plan: --Due for Cycle 8, Day 1 of Dara/Rev/Dex --Labs today show WBC 4.1, hemoglobin 15.4, MCV 95.7, platelets 161, Creatinine and LFTs are normal.  --Last M protein 0.1 on 08/20/2023 with kappa free light chains decreased to 89.0 ratio 29.67.  -- Return to clinic in 2 weeks with continued Dara/Rev/Dex.   #Ocular toxicity/Stye --Secondary to Velcade therapy --Used Moxifloxacin drops with improvement --Received azithromycin x 5 days.   #Supportive Care -- chemotherapy education complete -- port placement not required   -- zofran 8mg  q8H PRN and compazine 10mg  PO q6H for nausea -- acyclovir 400mg  PO BID for VCZ prophylaxis -- Recieved dental clearance prior to the start of Xgeva/Zometa. First dose on 01/07/2023. Last performed on 07/08/2023. Next dose in March 2025  -- Tramadol 50 mg every  6 hours as needed for pain control.  Currently taking 2 daily.  No orders of the defined types were placed in this encounter.  All questions were answered. The patient knows to call the clinic with any problems, questions or concerns.  I have spent a total of 30 minutes minutes of face-to-face and non-face-to-face time, preparing to see the patient, performing a medically appropriate examination, counseling and educating the patient,  documenting clinical information in the electronic health record,and care coordination.   Georga Kaufmann PA-C Dept of Hematology and Oncology [facility] Phone: [phone]    09/18/2023 3:15 PM

## 2023-09-18 NOTE — Progress Notes (Signed)
Pt took Decadron, Benadryl, Singulair and Tylenol prior to appointment time.

## 2023-09-18 NOTE — Patient Instructions (Signed)
[facility] - A DEPT OF [facility]  Discharge Instructions: Thank you for choosing [facility] to provide your oncology and hematology care.   If you have a lab appointment with the Cancer Center, please go directly to the Cancer Center and check in at the registration area.   Wear comfortable clothing and clothing appropriate for easy access to any Portacath or PICC line.   We strive to give you quality time with your provider. You may need to reschedule your appointment if you arrive late (15 or more minutes).  Arriving late affects you and other patients whose appointments are after yours.  Also, if you miss three or more appointments without notifying the office, you may be dismissed from the clinic at the provider's discretion.      For prescription refill requests, have your pharmacy contact our office and allow 72 hours for refills to be completed.    Today you received the following chemotherapy and/or immunotherapy agent: Daratumumab and Hyluronidase (Darzalex Faspro)      To help prevent nausea and vomiting after your treatment, we encourage you to take your nausea medication as directed.  BELOW ARE SYMPTOMS THAT SHOULD BE REPORTED IMMEDIATELY: *FEVER GREATER THAN 100.4 F (38 C) OR HIGHER *CHILLS OR SWEATING *NAUSEA AND VOMITING THAT IS NOT CONTROLLED WITH YOUR NAUSEA MEDICATION *UNUSUAL SHORTNESS OF BREATH *UNUSUAL BRUISING OR BLEEDING *URINARY PROBLEMS (pain or burning when urinating, or frequent urination) *BOWEL PROBLEMS (unusual diarrhea, constipation, pain near the anus) TENDERNESS IN MOUTH AND THROAT WITH OR WITHOUT PRESENCE OF ULCERS (sore throat, sores in mouth, or a toothache) UNUSUAL RASH, SWELLING OR PAIN  UNUSUAL VAGINAL DISCHARGE OR ITCHING   Items with * indicate a potential emergency and should be followed up as soon as possible or go to the Emergency Department if any problems should occur.  Please show the  CHEMOTHERAPY ALERT CARD or IMMUNOTHERAPY ALERT CARD at check-in to the Emergency Department and triage nurse.  Should you have questions after your visit or need to cancel or reschedule your appointment, please contact [facility] - A DEPT OF [facility]  Dept: [phone]  and follow the prompts.  Office hours are 8:00 a.m. to 4:30 p.m. Monday - Friday. Please note that voicemails left after 4:00 p.m. may not be returned until the following business day.  We are closed weekends and major holidays. You have access to a nurse at all times for urgent questions. Please call the main number to the clinic Dept: [phone] and follow the prompts.   For any non-urgent questions, you may also contact your provider using MyChart. We now offer e-Visits for anyone 72 and older to request care online for non-urgent symptoms. For details visit [email or web address].   Also download the MyChart app! Go to the app store, search "MyChart", open the app, select [facility], and log in with your MyChart username and password.  Daratumumab; Hyaluronidase Injection What is this medication? DARATUMUMAB; HYALURONIDASE (dar a toom ue mab; hye al ur ON i dase) treats multiple myeloma, a type of bone marrow cancer. Daratumumab works by blocking a protein that causes cancer cells to grow and multiply. This helps to slow or stop the spread of cancer cells. Hyaluronidase works by increasing the absorption of other medications in the body to help them work better. This medication may also be used treat amyloidosis, a condition that causes the buildup of a protein (  that usually do not require medical attention (report to your care team if they continue or are bothersome): Constipation Diarrhea Fatigue Nausea Pain, tingling, or numbness in the hands or feet Swelling of the ankles, hands, or feet This list may not describe all possible side effects. Call your doctor for medical advice about side effects. You may report side effects to FDA at 1-800-FDA-1088. Where should I keep my medication? This medication is given in a hospital or clinic. It will not be stored at home. NOTE:  This sheet is a summary. It may not cover all possible information. If you have questions about this medicine, talk to your doctor, pharmacist, or health care provider.  2024 Elsevier/Gold Standard (2021-11-28 00:00:00) Zoledronic Acid Injection (Cancer) What is this medication? ZOLEDRONIC ACID (ZOE le dron ik AS id) treats high calcium levels in the blood caused by cancer. It may also be used with chemotherapy to treat weakened bones caused by cancer. It works by slowing down the release of calcium from bones. This lowers calcium levels in your blood. It also makes your bones stronger and less likely to break (fracture). It belongs to a group of medications called bisphosphonates. This medicine may be used for other purposes; ask your health care provider or pharmacist if you have questions. COMMON BRAND NAME(S): Zometa, Zometa Powder What should I tell my care team before I take this medication? They need to know if you have any of these conditions: Dehydration Dental disease Kidney disease Liver disease Low levels of calcium in the blood Lung or breathing disease, such as asthma Receiving steroids, such as dexamethasone or prednisone An unusual or allergic reaction to zoledronic acid, other medications, foods, dyes, or preservatives Pregnant or trying to get pregnant Breast-feeding How should I use this medication? This medication is injected into a vein. It is given by your care team in a hospital or clinic setting. Talk to your care team about the use of this medication in children. Special care may be needed. Overdosage: If you think you have taken too much of this medicine contact a poison control center or emergency room at once. NOTE: This medicine is only for you. Do not share this medicine with others. What if I miss a dose? Keep appointments for follow-up doses. It is important not to miss your dose. Call your care team if you are unable to keep an appointment. What may  interact with this medication? Certain antibiotics given by injection Diuretics, such as bumetanide, furosemide NSAIDs, medications for pain and inflammation, such as ibuprofen or naproxen Teriparatide Thalidomide This list may not describe all possible interactions. Give your health care provider a list of all the medicines, herbs, non-prescription drugs, or dietary supplements you use. Also tell them if you smoke, drink alcohol, or use illegal drugs. Some items may interact with your medicine. What should I watch for while using this medication? Visit your care team for regular checks on your progress. It may be some time before you see the benefit from this medication. Some people who take this medication have severe bone, joint, or muscle pain. This medication may also increase your risk for jaw problems or a broken thigh bone. Tell your care team right away if you have severe pain in your jaw, bones, joints, or muscles. Tell you care team if you have any pain that does not go away or that gets worse. Tell your dentist and dental surgeon that you are taking this medication. You should not have major dental  surgery while on this medication. See your dentist to have a dental exam and fix any dental problems before starting this medication. Take good care of your teeth while on this medication. Make sure you see your dentist for regular follow-up appointments. You should make sure you get enough calcium and vitamin D while you are taking this medication. Discuss the foods you eat and the vitamins you take with your care team. Check with your care team if you have severe diarrhea, nausea, and vomiting, or if you sweat a lot. The loss of too much body fluid may make it dangerous for you to take this medication. You may need bloodwork while taking this medication. Talk to your care team if you wish to become pregnant or think you might be pregnant. This medication can cause serious birth defects. What  side effects may I notice from receiving this medication? Side effects that you should report to your care team as soon as possible: Allergic reactions--skin rash, itching, hives, swelling of the face, lips, tongue, or throat Kidney injury--decrease in the amount of urine, swelling of the ankles, hands, or feet Low calcium level--muscle pain or cramps, confusion, tingling, or numbness in the hands or feet Osteonecrosis of the jaw--pain, swelling, or redness in the mouth, numbness of the jaw, poor healing after dental work, unusual discharge from the mouth, visible bones in the mouth Severe bone, joint, or muscle pain Side effects that usually do not require medical attention (report to your care team if they continue or are bothersome): Constipation Fatigue Fever Loss of appetite Nausea Stomach pain This list may not describe all possible side effects. Call your doctor for medical advice about side effects. You may report side effects to FDA at 1-800-FDA-1088. Where should I keep my medication? This medication is given in a hospital or clinic. It will not be stored at home. NOTE: This sheet is a summary. It may not cover all possible information. If you have questions about this medicine, talk to your doctor, pharmacist, or health care provider.  2024 Elsevier/Gold Standard (2021-09-15 00:00:00)

## 2023-09-19 ENCOUNTER — Encounter: Payer: Self-pay | Admitting: Nurse Practitioner

## 2023-09-19 ENCOUNTER — Inpatient Hospital Stay (HOSPITAL_BASED_OUTPATIENT_CLINIC_OR_DEPARTMENT_OTHER): Payer: Medicare Other | Admitting: Nurse Practitioner

## 2023-09-19 ENCOUNTER — Encounter: Payer: Self-pay | Admitting: Hematology and Oncology

## 2023-09-19 DIAGNOSIS — G893 Neoplasm related pain (acute) (chronic): Secondary | ICD-10-CM

## 2023-09-19 DIAGNOSIS — C9000 Multiple myeloma not having achieved remission: Secondary | ICD-10-CM | POA: Diagnosis not present

## 2023-09-19 DIAGNOSIS — R530 Neoplastic (malignant) related fatigue: Secondary | ICD-10-CM

## 2023-09-19 DIAGNOSIS — Z515 Encounter for palliative care: Secondary | ICD-10-CM

## 2023-09-19 LAB — KAPPA/LAMBDA LIGHT CHAINS
Kappa free light chain: 86.3 mg/L — ABNORMAL HIGH (ref 3.3–19.4)
Kappa, lambda light chain ratio: 33.19 — ABNORMAL HIGH (ref 0.26–1.65)
Lambda free light chains: 2.6 mg/L — ABNORMAL LOW (ref 5.7–26.3)

## 2023-09-19 NOTE — Progress Notes (Signed)
Palliative Medicine [facility]  Telephone:(336) [phone] Fax:(336) [phone]   Name: JOELYS STAUBS Date: 09/19/2023 MRN: [id number]  DOB: February 07, 1941  Patient Care Team: [name], MD as PCP - General (Family Medicine) Pickenpack-Cousar, [name], NP as Nurse Practitioner (Nurse Practitioner)   I connected with [name] on 09/19/23 at 11:30 AM EST by telephone and verified that I am speaking with the correct person using two identifiers.   I discussed the limitations, risks, security and privacy concerns of performing an evaluation and management service by telemedicine and the availability of in-person appointments. I also discussed with the patient that there may be a patient responsible charge related to this service. The patient expressed understanding and agreed to proceed.   Other persons participating in the visit and their role in the encounter: N/A   Patient's location: Home    Provider's location: [facility]   INTERVAL HISTORY: SHILPA BUSHEE is a 83 y.o. female with oncologic medical history including recent diagnosis of multiple myeloma (12/2022), DM, and posterior vitreous detachment of left and right eyes. Palliative ask to see for symptom management and goals of care   SOCIAL HISTORY:     reports that she has never smoked. She has never used smokeless tobacco. She reports that she does not drink alcohol and does not use drugs.  ADVANCE DIRECTIVES:  Advanced directives on file  CODE STATUS: Full code  PAST MEDICAL HISTORY: Past Medical History:  Diagnosis Date   Anxiety    Arthritis    Asthma    Hx: of   Cancer (HCC)    Hx: of skin cancer   Diverticulosis    GERD (gastroesophageal reflux disease)    Hx: of   History of kidney stones    Hyperlipemia    Hypertension    Pre-diabetes     ALLERGIES:  is allergic to hydrocodone, vioxx [rofecoxib], codeine, doxycycline hyclate, and latex.  MEDICATIONS:  Current Outpatient Medications   Medication Sig Dispense Refill   acyclovir (ZOVIRAX) 400 MG tablet Take 1 tablet (400 mg total) by mouth 2 (two) times daily. 180 tablet 0   ALPRAZolam (XANAX) 0.25 MG tablet Take 0.25 mg by mouth 3 (three) times daily as needed.     amLODipine (NORVASC) 5 MG tablet Take 5 mg by mouth daily.     aspirin 81 MG tablet Take 81 mg by mouth daily.     bacitracin-neomycin-polymyxin-hydrocortisone (CORTISPORIN) 1 % ophthalmic ointment Place 1 Application into the right eye 2 (two) times daily. 3.5 g 0   Cholecalciferol (VITAMIN D3) 2000 UNITS capsule Take 2,000 Units by mouth daily.     dexamethasone (DECADRON) 4 MG tablet Take 40 mg (10 tablets) by mouth once a week. 40 tablet 3   dextromethorphan-guaiFENesin (TUSSIN DM) 10-100 MG/5ML liquid Take 10 mLs by mouth every 4 (four) hours as needed for cough.     fluticasone (FLONASE) 50 MCG/ACT nasal spray Place 2 sprays into the nose at bedtime.     hydrOXYzine (ATARAX) 25 MG tablet Take 25 mg by mouth at bedtime.     lenalidomide (REVLIMID) 10 MG capsule Take 1 capsule (10 mg total) by mouth daily. Take for 21 days, then none for 7 days.  [employer] Auth # [id number] Date Obtained 09/05/23 21 capsule 0   levocetirizine (XYZAL) 5 MG tablet Take 5 mg by mouth every evening.     montelukast (SINGULAIR) 10 MG tablet Take 10 mg by mouth daily.  Probiotic Product (ALIGN PO) Take by mouth daily.     simvastatin (ZOCOR) 20 MG tablet Take 20 mg by mouth every evening.     traMADol (ULTRAM) 50 MG tablet Take 1-2 tablets (50-100 mg total) by mouth every 4 (four) hours as needed. 90 tablet 0   No current facility-administered medications for this visit.    VITAL SIGNS: There were no vitals taken for this visit. There were no vitals filed for this visit.  Estimated body mass index is 24.09 kg/m as calculated from the following:   Height as of 06/24/23: 5\' 1"  (1.549 m).   Weight as of 09/18/23: 127 lb 8 oz (57.8 kg).   PERFORMANCE STATUS (ECOG) : 2 -  Symptomatic, <50% confined to bed   Discussed the use of AI scribe software for clinical note transcription with the patient, who gave verbal consent to proceed.   IMPRESSION:  I connected with Ms. Muzquiz by phone for follow-up. No acute distress. Denies nausea, vomiting, or constipation. Taking things one day at a time. Shares some concern with recent health challenges of her grandson related to visual changes. She and daughter are supporting with work-up with his providers to determine cause and final diagnosis. She is remaining as active as possible. Tries to listen to her body and take breaks if she experiences fatigue and pain. Reports ongoing visual changes described as "cloudy" at times as if something is in her eyes. Clears as day goes on and is intermittent. Recent visit with Opthalmologist with no concerning findings. Some days are better than others. She is using OTC Pataday with offers some improvement and decreases drainage.   We discussed her pain at length. Feels pain is well controlled on current regimen which includes Tylenol as needed and Tramadol. She is tolerating without difficulty. We discussed maximum Tylenol usage daily which includes Tylenol 325mg  versus extra-strength 500mg . She is taking 650mg  as needed for mild pain. She is requiring Tramadol 1-2 tablets daily as her pain worsens with activity. No changes to regimen at this time. We will continue to closely monitor and support as needed.  Goals of Care 5/20- We discussed her current illness and what it means in the larger context of her on-going co-morbidities. Natural disease trajectory and expectations were discussed.   Patient and daughter are realistic in their understanding of current illness. Is remaining hopeful for stability and improve quality of life.   We discussed Her current illness and what it means in the larger context of Her on-going co-morbidities. Natural disease trajectory and expectations were  discussed.  I discussed the importance of continued conversation with family and their medical providers regarding overall plan of care and treatment options, ensuring decisions are within the context of the patients values and GOCs.  PLAN:  Cancer Related Pain Pain is well controlled with use of Tylenol for mild to moderate pain and Tramadol for severe pain. No adjustments made.  -Encourage patient to take breaks and rest as needed. -Continue monitoring pain levels and consider further evaluation if pain persists or worsens. -Tramadol 50-100mg  every 4 hours as needed. -Tylenol ES every 8 hours as needed. Maximum dose of 3000mg  daily.    Follow-up -I will plan to see patient back in 4-6 weeks in collaboration with Oncology visits. She knows to contact office sooner if needed.  Patient expressed understanding and was in agreement with this plan. She also understands that She can call the clinic at any time with any questions, concerns, or complaints.  Any controlled substances utilized were prescribed in the context of palliative care. PDMP has been reviewed.   Visit consisted of counseling and education dealing with the complex and emotionally intense issues of symptom management and palliative care in the setting of serious and potentially life-threatening illness.  Willette Alma, AGPCNP-BC  Palliative Medicine Team/ Cancer Center

## 2023-09-22 LAB — MULTIPLE MYELOMA PANEL, SERUM
Albumin SerPl Elph-Mcnc: 3.8 g/dL (ref 2.9–4.4)
Albumin/Glob SerPl: 1.7 (ref 0.7–1.7)
Alpha 1: 0.3 g/dL (ref 0.0–0.4)
Alpha2 Glob SerPl Elph-Mcnc: 0.8 g/dL (ref 0.4–1.0)
B-Globulin SerPl Elph-Mcnc: 0.9 g/dL (ref 0.7–1.3)
Gamma Glob SerPl Elph-Mcnc: 0.3 g/dL — ABNORMAL LOW (ref 0.4–1.8)
Globulin, Total: 2.3 g/dL (ref 2.2–3.9)
IgA: 16 mg/dL — ABNORMAL LOW (ref 64–422)
IgG (Immunoglobin G), Serum: 341 mg/dL — ABNORMAL LOW (ref 586–1602)
IgM (Immunoglobulin M), Srm: 19 mg/dL — ABNORMAL LOW (ref 26–217)
M Protein SerPl Elph-Mcnc: 0.1 g/dL — ABNORMAL HIGH
Total Protein ELP: 6.1 g/dL (ref 6.0–8.5)

## 2023-09-25 ENCOUNTER — Encounter: Payer: Self-pay | Admitting: Hematology and Oncology

## 2023-10-02 ENCOUNTER — Other Ambulatory Visit: Payer: Self-pay | Admitting: Hematology and Oncology

## 2023-10-02 DIAGNOSIS — C9000 Multiple myeloma not having achieved remission: Secondary | ICD-10-CM

## 2023-10-07 ENCOUNTER — Telehealth: Payer: Self-pay | Admitting: *Deleted

## 2023-10-07 NOTE — Telephone Encounter (Signed)
 Received call from pt.  She states she has developed a raised red rash all over her body, except her face. She is now on her week off of her Revlimid and it has started to get a little bit better. She is asking if this may be related to her Revlimid. Advised that this is a common side effect. Advised that I will discuss with Georga Kaufmann, PA with Dr. Leonides Schanz and then call back with recommendations.

## 2023-10-09 NOTE — Telephone Encounter (Signed)
 TCT patient after consulting with Georga Kaufmann, PA about pt's rash. Spoke with her. Per Georga Kaufmann, PA-she suggested zyrtec or benadryl for the rash and to resume the Revlimid as scheduled. Pt states she already takes zyrtec daily. She states she prefers to wait on the Revlimid until she sees Dr. Leonides Schanz next week.

## 2023-10-10 DIAGNOSIS — H04123 Dry eye syndrome of bilateral lacrimal glands: Secondary | ICD-10-CM | POA: Diagnosis not present

## 2023-10-16 ENCOUNTER — Other Ambulatory Visit: Payer: Self-pay | Admitting: Hematology and Oncology

## 2023-10-16 ENCOUNTER — Inpatient Hospital Stay: Payer: Medicare Other

## 2023-10-16 ENCOUNTER — Inpatient Hospital Stay (HOSPITAL_BASED_OUTPATIENT_CLINIC_OR_DEPARTMENT_OTHER): Payer: Medicare Other | Admitting: Hematology and Oncology

## 2023-10-16 ENCOUNTER — Inpatient Hospital Stay: Payer: Medicare Other | Attending: Hematology and Oncology

## 2023-10-16 VITALS — BP 155/68 | HR 91 | Temp 98.1°F | Resp 16 | Wt 127.9 lb

## 2023-10-16 DIAGNOSIS — C9000 Multiple myeloma not having achieved remission: Secondary | ICD-10-CM | POA: Diagnosis not present

## 2023-10-16 DIAGNOSIS — Z79899 Other long term (current) drug therapy: Secondary | ICD-10-CM | POA: Diagnosis not present

## 2023-10-16 DIAGNOSIS — Z5112 Encounter for antineoplastic immunotherapy: Secondary | ICD-10-CM | POA: Insufficient documentation

## 2023-10-16 LAB — CMP (CANCER CENTER ONLY)
ALT: 12 U/L (ref 0–44)
AST: 17 U/L (ref 15–41)
Albumin: 4.5 g/dL (ref 3.5–5.0)
Alkaline Phosphatase: 59 U/L (ref 38–126)
Anion gap: 8 (ref 5–15)
BUN: 15 mg/dL (ref 8–23)
CO2: 25 mmol/L (ref 22–32)
Calcium: 8.7 mg/dL — ABNORMAL LOW (ref 8.9–10.3)
Chloride: 106 mmol/L (ref 98–111)
Creatinine: 0.75 mg/dL (ref 0.44–1.00)
GFR, Estimated: >60 mL/min (ref >60)
Glucose, Bld: 274 mg/dL — ABNORMAL HIGH (ref 70–99)
Potassium: 3.7 mmol/L (ref 3.5–5.1)
Sodium: 139 mmol/L (ref 135–145)
Total Bilirubin: 0.6 mg/dL (ref 0.0–1.2)
Total Protein: 6.3 g/dL — ABNORMAL LOW (ref 6.5–8.1)

## 2023-10-16 LAB — CBC WITH DIFFERENTIAL (CANCER CENTER ONLY)
Abs Immature Granulocytes: 0.01 10*3/uL (ref 0.00–0.07)
Basophils Absolute: 0 10*3/uL (ref 0.0–0.1)
Basophils Relative: 1 %
Eosinophils Absolute: 0 10*3/uL (ref 0.0–0.5)
Eosinophils Relative: 1 %
HCT: 42.4 % (ref 36.0–46.0)
Hemoglobin: 15.1 g/dL — ABNORMAL HIGH (ref 12.0–15.0)
Immature Granulocytes: 0 %
Lymphocytes Relative: 13 %
Lymphs Abs: 0.4 10*3/uL — ABNORMAL LOW (ref 0.7–4.0)
MCH: 33.4 pg (ref 26.0–34.0)
MCHC: 35.6 g/dL (ref 30.0–36.0)
MCV: 93.8 fL (ref 80.0–100.0)
Monocytes Absolute: 0 10*3/uL — ABNORMAL LOW (ref 0.1–1.0)
Monocytes Relative: 1 %
Neutro Abs: 2.4 10*3/uL (ref 1.7–7.7)
Neutrophils Relative %: 84 %
Platelet Count: 195 10*3/uL (ref 150–400)
RBC: 4.52 MIL/uL (ref 3.87–5.11)
RDW: 12.9 % (ref 11.5–15.5)
WBC Count: 2.9 10*3/uL — ABNORMAL LOW (ref 4.0–10.5)
nRBC: 0 % (ref 0.0–0.2)

## 2023-10-16 MED ORDER — SODIUM CHLORIDE 0.9 % IV SOLN: INTRAVENOUS | Status: DC

## 2023-10-16 MED ORDER — DARATUMUMAB-HYALURONIDASE-FIHJ 1800-30000 MG-UT/15ML ~~LOC~~ SOLN
1800.0000 mg | Freq: Once | SUBCUTANEOUS | Status: AC
  Administered 2023-10-16: 1800 mg via SUBCUTANEOUS
  Filled 2023-10-16: qty 15

## 2023-10-16 MED ORDER — ZOLEDRONIC ACID 4 MG/5ML IV CONC
3.3000 mg | Freq: Once | INTRAVENOUS | Status: AC
  Administered 2023-10-16: 3.3 mg via INTRAVENOUS
  Filled 2023-10-16: qty 4.13

## 2023-10-16 NOTE — Progress Notes (Signed)
 [facility] Telephone:(336) [phone]   Fax:(336) [phone]  PROGRESS NOTE  Patient Care Team: [name], MD as PCP - General (Family Medicine) Pickenpack-Cousar, [name], NP as Nurse Practitioner (Nurse Practitioner)  Hematological/Oncological History # Plasmacytoma of Thoracic Spine  # IgG Kappa Multiple myeloma 09/22/2022: Patient underwent MRI thoracic spine which showed acute or subacute T7, T10, and T11 compression fractures 10/10/2022: biopsy of compression fracture of T11 reveals a plasmacytoma 11/13/2022: establish care with Dr. Leonides Schanz  12/06/2022: bone marrow biopsy showed plasma cell myeloma involving 80% of the marrow.  12/24/2022: Cycle 1 Day 1 of Velcade/Dex 01/14/2023: Cycle 2 Day 1 of Velcade/Dex 02/04/2023: Cycle 3 Day 1 of Velcade/Dex 02/26/2023: Cycle 4 Day 1 of Velcade/Dex HELD due to ocular toxicity 03/04/2023: Cycle 1 Day 1 of Dara/Dex  04/01/2023: Cycle 2 Day 1 of Dara/Dex  04/29/2023: Cycle 3 Day 1 of Dara/Dex  05/27/2023: Cycle 4 Day 1 of Dara/Dex  06/24/2023: Cycle 5 Day 1 of Dara/Dex  07/23/2023: Cycle 6 Day 1 of Dara/Rev/Dex  08/20/2023: Cycle 7 Day 1 of Dara/Rev/Dex  09/18/2023 Cycle 8 Day 1 of Dara/Rev/Dex   Interval History:  Alice Anthony 83 y.o. female with medical history significant for newly diagnosed IgG kappa multiple myeloma who presents for a follow up visit. The patient's last visit was on 08/20/2023.  In the interim since the last visit she has continued on Dara/Rev/Dex therapy.   On exam today Alice Anthony is accompanied by her son today.  She reports she developed a severe rash over all of her body with her last round of Revlimid.  She reports she broke out everywhere except her face.  It was itchy and painful.  She reports it flared for about 7 days and began cooling off when she stopped her Revlimid therapy.  She reports has been off it for 12 days at this point.  She notes that she has not been having any other symptoms such as  nausea, vomiting, or diarrhea.  She reports that she does not feel this is related to the Darzalex as it resolved quickly upon discontinuation of the Revlimid.  She reports that she has been taking a lot of antihistamines was not able to take much more during this episode.  She reports her energy levels have been "okay".  She denies any bleeding, bruising, bowel changes, shortness of breath, chest pain, cough, fever/chills, nausea/vomiting, or night sweats.   Overall she is willing and able to continue with chemotherapy at this time.  A full 10 point ROS is otherwise negative.  Today we discussed options moving forward including continuing to hold the Revlimid while awaiting the results of her protein panel from today.  We discussed lowering the dosage, transitioning the Pomalyst, or transitioning to Kyprolis.  Pending the results of her next set of labs we will determine what the neck step will be.  The patient voiced understanding of our plan moving forward.   MEDICAL HISTORY:  Past Medical History:  Diagnosis Date   Anxiety    Arthritis    Asthma    Hx: of   Cancer (HCC)    Hx: of skin cancer   Diverticulosis    GERD (gastroesophageal reflux disease)    Hx: of   History of kidney stones    Hyperlipemia    Hypertension    Pre-diabetes     SURGICAL HISTORY: Past Surgical History:  Procedure Laterality Date   ABDOMINAL SURGERY  09/19/2012   Ruptured Diverticuli, Ostomy Placed.  BREAST BIOPSY  06/06/2012   breast biopsy   CATARACT EXTRACTION W/ INTRAOCULAR LENS IMPLANT Bilateral    COLONOSCOPY  03/06/2012   COLOSTOMY CLOSURE  12/25/2012   Dr Luisa Hart   COLOSTOMY CLOSURE N/A 12/25/2012   Procedure: COLOSTOMY CLOSURE;  Surgeon: Clovis Pu. Cornett, MD;  Location: [facility] OR;  Service: General;  Laterality: N/A;   EXTRACORPOREAL SHOCK WAVE LITHOTRIPSY Left 11/17/2018   Procedure: EXTRACORPOREAL SHOCK WAVE LITHOTRIPSY (ESWL);  Surgeon: Malen Gauze, MD;  Location: [facility] ORS;  Service:  Urology;  Laterality: Left;   EYE SURGERY  10/19/1998   Hx: of cyst removed from left eye   KYPHOPLASTY N/A 10/10/2022   Procedure: KYPHOPLASTY Thoracic seven , Thoracic ten,Thoracic eleven;  Surgeon: Tressie Stalker, MD;  Location: [facility] OR;  Service: Neurosurgery;  Laterality: N/A;   LAPAROTOMY N/A 09/19/2012   Procedure: EXPLORATORY LAPAROTOMY, sigmoid colectomy;  Surgeon: Clovis Pu. Cornett, MD;  Location: [facility] ORS;  Service: General;  Laterality: N/A;   SIGMOIDOSCOPY  01/11/1999   TONSILLECTOMY     TRIGGER FINGER RELEASE  12/25/2010   TUBAL LIGATION  10/16/1985    SOCIAL HISTORY: Social History   Socioeconomic History   Marital status: Widowed    Spouse name: Not on file   Number of children: Not on file   Years of education: Not on file   Highest education level: Not on file  Occupational History   Not on file  Tobacco Use   Smoking status: Never   Smokeless tobacco: Never  Vaping Use   Vaping status: Never Used  Substance and Sexual Activity   Alcohol use: No   Drug use: No   Sexual activity: Not Currently  Other Topics Concern   Not on file  Social History Narrative   Not on file   Social Drivers of Health   Financial Resource Strain: Not on file  Food Insecurity: Not on file  Transportation Needs: Not on file  Physical Activity: Not on file  Stress: Not on file  Social Connections: Not on file  Intimate Partner Violence: Not on file    FAMILY HISTORY: Family History  Problem Relation Age of Onset   Diabetes Father    Alzheimer's disease Father    Basal cell carcinoma Mother        Metastatic    ALLERGIES:  is allergic to hydrocodone, vioxx [rofecoxib], codeine, doxycycline hyclate, and latex.  MEDICATIONS:  Current Outpatient Medications  Medication Sig Dispense Refill   VEVYE 0.1 % SOLN Place 1 drop into both eyes in the morning and at bedtime.     acyclovir (ZOVIRAX) 400 MG tablet Take 1 tablet (400 mg total) by mouth 2 (two) times daily. 180 tablet  0   ALPRAZolam (XANAX) 0.25 MG tablet Take 0.25 mg by mouth 3 (three) times daily as needed.     amLODipine (NORVASC) 5 MG tablet Take 5 mg by mouth daily.     aspirin 81 MG tablet Take 81 mg by mouth daily.     bacitracin-neomycin-polymyxin-hydrocortisone (CORTISPORIN) 1 % ophthalmic ointment Place 1 Application into the right eye 2 (two) times daily. 3.5 g 0   Cholecalciferol (VITAMIN D3) 2000 UNITS capsule Take 2,000 Units by mouth daily.     dexamethasone (DECADRON) 4 MG tablet Take 40 mg (10 tablets) by mouth once a week. 40 tablet 3   dextromethorphan-guaiFENesin (TUSSIN DM) 10-100 MG/5ML liquid Take 10 mLs by mouth every 4 (four) hours as needed for cough.     fluticasone (FLONASE) 50 MCG/ACT  nasal spray Place 2 sprays into the nose at bedtime.     hydrOXYzine (ATARAX) 25 MG tablet Take 25 mg by mouth at bedtime.     lenalidomide (REVLIMID) 10 MG capsule TAKE 1 CAPSULE BY MOUTH 1 TIME A DAY FOR 21 DAYS ON THEN 7 DAYS OFF 21 capsule 0   levocetirizine (XYZAL) 5 MG tablet Take 5 mg by mouth every evening.     montelukast (SINGULAIR) 10 MG tablet Take 10 mg by mouth daily.     Probiotic Product (ALIGN PO) Take by mouth daily.     simvastatin (ZOCOR) 20 MG tablet Take 20 mg by mouth every evening.     traMADol (ULTRAM) 50 MG tablet Take 1-2 tablets (50-100 mg total) by mouth every 4 (four) hours as needed. 90 tablet 0   No current facility-administered medications for this visit.    REVIEW OF SYSTEMS:   Constitutional: ( - ) fevers, ( - )  chills , ( - ) night sweats Eyes: ( - ) blurriness of vision, ( - ) double vision, ( - ) watery eyes Ears, nose, mouth, throat, and face: ( - ) mucositis, ( - ) sore throat Respiratory: ( - ) cough, ( - ) dyspnea, ( - ) wheezes Cardiovascular: ( - ) palpitation, ( - ) chest discomfort, ( - ) lower extremity swelling Gastrointestinal:  ( - ) nausea, ( - ) heartburn, ( - ) change in bowel habits Skin: ( - ) abnormal skin rashes Lymphatics: ( - ) new  lymphadenopathy, ( - ) easy bruising Neurological: ( - ) numbness, ( - ) tingling, ( - ) new weaknesses Behavioral/Psych: ( - ) mood change, ( - ) new changes  All other systems were reviewed with the patient and are negative.  PHYSICAL EXAMINATION: ECOG PERFORMANCE STATUS: 1 - Symptomatic but completely ambulatory  Vitals:   10/16/23 1504  BP: (!) 155/68  Pulse: 91  Resp: 16  Temp: 98.1 F (36.7 C)  SpO2: 97%        Filed Weights   10/16/23 1504  Weight: 127 lb 14.4 oz (58 kg)         GENERAL: Well-appearing elderly Caucasian female, alert, no distress and comfortable SKIN: skin color, texture, turgor are normal, no rashes or significant lesions EYES: conjunctiva are pink and non-injected, sclera clear.  LUNGS: clear to auscultation and percussion with normal breathing effort HEART: regular rate & rhythm and no murmurs and no lower extremity edema Musculoskeletal: no cyanosis of digits and no clubbing  PSYCH: alert & oriented x 3, fluent speech NEURO: no focal motor/sensory deficits  LABORATORY DATA:  I have reviewed the data as listed    Latest Ref Rng & Units 10/16/2023    2:36 PM 09/18/2023    1:02 PM 08/20/2023    9:51 AM  CBC  WBC 4.0 - 10.5 K/uL 2.9  4.1  5.1   Hemoglobin 12.0 - 15.0 g/dL 40.3  47.4  25.9   Hematocrit 36.0 - 46.0 % 42.4  44.8  43.0   Platelets 150 - 400 K/uL 195  161  207        Latest Ref Rng & Units 10/16/2023    2:36 PM 09/18/2023    1:02 PM 08/20/2023    9:51 AM  CMP  Glucose 70 - 99 mg/dL 563  875  643   BUN 8 - 23 mg/dL 15  13  11    Creatinine 0.44 - 1.00 mg/dL 3.29  5.18  8.41  Sodium 135 - 145 mmol/L 139  139  141   Potassium 3.5 - 5.1 mmol/L 3.7  3.8  4.3   Chloride 98 - 111 mmol/L 106  107  107   CO2 22 - 32 mmol/L 25  25  28    Calcium 8.9 - 10.3 mg/dL 8.7  9.2  9.2   Total Protein 6.5 - 8.1 g/dL 6.3  6.7  6.1   Total Bilirubin 0.0 - 1.2 mg/dL 0.6  0.6  0.6   Alkaline Phos 38 - 126 U/L 59  63  54   AST 15 - 41 U/L 17   16  15    ALT 0 - 44 U/L 12  12  11      Lab Results  Component Value Date   MPROTEIN 0.1 (H) 09/18/2023   MPROTEIN 0.1 (H) 08/20/2023   MPROTEIN 0.2 (H) 07/23/2023   Lab Results  Component Value Date   KPAFRELGTCHN 86.3 (H) 09/18/2023   KPAFRELGTCHN 89.0 (H) 08/20/2023   KPAFRELGTCHN 111.6 (H) 07/23/2023   LAMBDASER 2.6 (L) 09/18/2023   LAMBDASER 3.0 (L) 08/20/2023   LAMBDASER 2.7 (L) 07/23/2023   KAPLAMBRATIO 33.19 (H) 09/18/2023   KAPLAMBRATIO 29.67 (H) 08/20/2023   KAPLAMBRATIO 41.33 (H) 07/23/2023   Lab Results  Component Value Date   MPROTEIN 0.1 (H) 09/18/2023   MPROTEIN 0.1 (H) 08/20/2023   MPROTEIN 0.2 (H) 07/23/2023   Lab Results  Component Value Date   KPAFRELGTCHN 86.3 (H) 09/18/2023   KPAFRELGTCHN 89.0 (H) 08/20/2023   KPAFRELGTCHN 111.6 (H) 07/23/2023   LAMBDASER 2.6 (L) 09/18/2023   LAMBDASER 3.0 (L) 08/20/2023   LAMBDASER 2.7 (L) 07/23/2023   KAPLAMBRATIO 33.19 (H) 09/18/2023   KAPLAMBRATIO 29.67 (H) 08/20/2023   KAPLAMBRATIO 41.33 (H) 07/23/2023     RADIOGRAPHIC STUDIES: No results found.  ASSESSMENT & PLAN Alice Anthony 83 y.o. female with medical history significant for IgG kappa multiple myeloma who presents for a follow up visit.   # IgG Kappa Multiple Myeloma # Plasmacytoma of Spine  -- Patient diagnosed with plasmacytoma of the spine, subsequent bone marrow biopsy confirmed an IgG kappa multiple myeloma involving the bone marrow --start of Velcade/Dex Cycle 1 Day 1 on 12/24/2022. --Due to ocular toxicity, recommended to discontinue Velcade therapy and switch to Daratumumab.  --Started Revlimid on 07/17/2023 at 10 mg PO daily.  Plan: --Due for Cycle 9, Day 1 of Dara/Rev/Dex -- HOLD Revlimid in setting of severe rash. --Labs today show WBC 2.9, hemoglobin 15.1, MCV 93.8, platelets 195, Creatinine and LFTs are normal.  --Last M protein 0.1 on 08/20/2023 with kappa free light chains decreased to 89.0 ratio 29.67.  -- Return to clinic in 4  weeks with continued Dara/Dex.  May return sooner if new therapy is required.  Awaiting results of today's protein labs to determine next steps.   #Ocular toxicity/Stye --Secondary to Velcade therapy --Used Moxifloxacin drops with improvement --Received azithromycin x 5 days.   #Supportive Care -- chemotherapy education complete -- port placement not required   -- zofran 8mg  q8H PRN and compazine 10mg  PO q6H for nausea -- acyclovir 400mg  PO BID for VCZ prophylaxis -- Recieved dental clearance prior to the start of Xgeva/Zometa. First dose on 01/07/2023. Last performed on 07/08/2023. Next dose in March 2025  -- Tramadol 50 mg every 6 hours as needed for pain control.  Currently taking 2 daily.  No orders of the defined types were placed in this encounter.  All questions were answered. The patient knows to call  the clinic with any problems, questions or concerns.  I have spent a total of 30 minutes minutes of face-to-face and non-face-to-face time, preparing to see the patient, performing a medically appropriate examination, counseling and educating the patient,  documenting clinical information in the electronic health record,and care coordination.   Ulysees Barns, MD Department of Hematology/Oncology [facility] Phone: [phone] Pager: [phone] Email: [email or web address]   10/16/2023 4:45 PM

## 2023-10-17 LAB — KAPPA/LAMBDA LIGHT CHAINS
Kappa free light chain: 62.6 mg/L — ABNORMAL HIGH (ref 3.3–19.4)
Kappa, lambda light chain ratio: 24.08 — ABNORMAL HIGH (ref 0.26–1.65)
Lambda free light chains: 2.6 mg/L — ABNORMAL LOW (ref 5.7–26.3)

## 2023-10-18 ENCOUNTER — Other Ambulatory Visit: Payer: Self-pay | Admitting: Hematology and Oncology

## 2023-10-18 DIAGNOSIS — Z515 Encounter for palliative care: Secondary | ICD-10-CM

## 2023-10-18 DIAGNOSIS — C9000 Multiple myeloma not having achieved remission: Secondary | ICD-10-CM

## 2023-10-18 DIAGNOSIS — G893 Neoplasm related pain (acute) (chronic): Secondary | ICD-10-CM

## 2023-10-18 NOTE — Telephone Encounter (Signed)
 Pt called for medication refill, see associated orders

## 2023-10-20 LAB — MULTIPLE MYELOMA PANEL, SERUM
Albumin SerPl Elph-Mcnc: 3.8 g/dL (ref 2.9–4.4)
Albumin/Glob SerPl: 1.8 — ABNORMAL HIGH (ref 0.7–1.7)
Alpha 1: 0.3 g/dL (ref 0.0–0.4)
Alpha2 Glob SerPl Elph-Mcnc: 0.7 g/dL (ref 0.4–1.0)
B-Globulin SerPl Elph-Mcnc: 0.9 g/dL (ref 0.7–1.3)
Gamma Glob SerPl Elph-Mcnc: 0.3 g/dL — ABNORMAL LOW (ref 0.4–1.8)
Globulin, Total: 2.2 g/dL (ref 2.2–3.9)
IgA: 19 mg/dL — ABNORMAL LOW (ref 64–422)
IgG (Immunoglobin G), Serum: 349 mg/dL — ABNORMAL LOW (ref 586–1602)
IgM (Immunoglobulin M), Srm: 19 mg/dL — ABNORMAL LOW (ref 26–217)
M Protein SerPl Elph-Mcnc: 0.1 g/dL — ABNORMAL HIGH
Total Protein ELP: 6 g/dL (ref 6.0–8.5)

## 2023-10-21 ENCOUNTER — Telehealth: Payer: Self-pay | Admitting: *Deleted

## 2023-10-21 ENCOUNTER — Other Ambulatory Visit: Payer: Self-pay | Admitting: *Deleted

## 2023-10-21 MED ORDER — LENALIDOMIDE 5 MG PO CAPS
5.0000 mg | ORAL_CAPSULE | Freq: Every day | ORAL | 0 refills | Status: DC
Start: 2023-10-21 — End: 2023-10-30

## 2023-10-21 NOTE — Telephone Encounter (Signed)
 TCT patient to advise that Dr. Leonides Schanz wants to have her resume her Revlimid but at a reduced dose of 5 mg. Pt is agreeable to this plan. She will contact [facility] to see if they will take back the recent order of 10 mg. That package has not been opened at all since delivery.  Advised to call if she develops any rashes on the 5 mg capsule. She said she would. New prescription for 5 mg capsules has been sent to [facility].

## 2023-10-21 NOTE — Telephone Encounter (Signed)
 Tramadol 50 mg Medication Prior Authorization Status  Processed CoverMyMeds KEY: [facility]  Approved Today  Per [employer]  PA Case ID: [id number]   Effective 08/07/2023 through 08/05/2024.

## 2023-10-23 NOTE — Progress Notes (Deleted)
 Palliative Medicine [facility]  [phone] [phone]) [phone]   Name: Alice Anthony Date: 10/23/2023 MRN: [id number]  DOB: [birth year]  Patient Care Team: [name], MD as PCP - General (Family Medicine) Pickenpack-Cousar, [name], NP as Nurse Practitioner (Nurse Practitioner)   I connected with [name] on 10/23/23 at  3:20 PM EDT by telephone and verified that I am speaking with the correct person using two identifiers.   I discussed the limitations, risks, security and privacy concerns of performing an evaluation and management service by telemedicine and the availability of in-person appointments. I also discussed with the patient that there may be a patient responsible charge related to this service. The patient expressed understanding and agreed to proceed.   Other persons participating in the visit and their role in the encounter: N/A   Patient's location: Home    Provider's location: [facility]   INTERVAL HISTORY: Alice Anthony is a 83 y.o. female with oncologic medical history including recent diagnosis of multiple myeloma (12/2022), DM, and posterior vitreous detachment of left and right eyes. Palliative ask to see for symptom management and goals of care   SOCIAL HISTORY:     reports that she has never smoked. She has never used smokeless tobacco. She reports that she does not drink alcohol and does not use drugs.  ADVANCE DIRECTIVES:  Advanced directives on file  CODE STATUS: Full code  PAST MEDICAL HISTORY: Past Medical History:  Diagnosis Date   Anxiety    Arthritis    Asthma    Hx: of   Cancer (HCC)    Hx: of skin cancer   Diverticulosis    GERD (gastroesophageal reflux disease)    Hx: of   History of kidney stones    Hyperlipemia    Hypertension    Pre-diabetes     ALLERGIES:  is allergic to hydrocodone, vioxx [rofecoxib], codeine, doxycycline hyclate, and latex.  MEDICATIONS:  Current Outpatient Medications   Medication Sig Dispense Refill   acyclovir (ZOVIRAX) 400 MG tablet Take 1 tablet (400 mg total) by mouth 2 (two) times daily. 180 tablet 0   ALPRAZolam (XANAX) 0.25 MG tablet Take 0.25 mg by mouth 3 (three) times daily as needed.     amLODipine (NORVASC) 5 MG tablet Take 5 mg by mouth daily.     aspirin 81 MG tablet Take 81 mg by mouth daily.     bacitracin-neomycin-polymyxin-hydrocortisone (CORTISPORIN) 1 % ophthalmic ointment Place 1 Application into the right eye 2 (two) times daily. 3.5 g 0   Cholecalciferol (VITAMIN D3) 2000 UNITS capsule Take 2,000 Units by mouth daily.     dexamethasone (DECADRON) 4 MG tablet Take 40 mg (10 tablets) by mouth once a week. 40 tablet 3   dextromethorphan-guaiFENesin (TUSSIN DM) 10-100 MG/5ML liquid Take 10 mLs by mouth every 4 (four) hours as needed for cough.     fluticasone (FLONASE) 50 MCG/ACT nasal spray Place 2 sprays into the nose at bedtime.     hydrOXYzine (ATARAX) 25 MG tablet Take 25 mg by mouth at bedtime.     lenalidomide (REVLIMID) 5 MG capsule Take 1 capsule (5 mg total) by mouth daily. [employer] Auth #  [id number]     Date Obtained 10/02/23 Take 1 capsule daily for 21 days then none for 7 days 21 capsule 0   levocetirizine (XYZAL) 5 MG tablet Take 5 mg by mouth every evening.     montelukast (SINGULAIR) 10 MG tablet Take 10  mg by mouth daily.     Probiotic Product (ALIGN PO) Take by mouth daily.     simvastatin (ZOCOR) 20 MG tablet Take 20 mg by mouth every evening.     traMADol (ULTRAM) 50 MG tablet TAKE 1 TO 2 TABLETS BY MOUTH EVERY 4 HOURS AS NEEDED 90 tablet 0   VEVYE 0.1 % SOLN Place 1 drop into both eyes in the morning and at bedtime.     No current facility-administered medications for this visit.    VITAL SIGNS: There were no vitals taken for this visit. There were no vitals filed for this visit.  Estimated body mass index is 24.17 kg/m as calculated from the following:   Height as of 06/24/23: 5\' 1"  (1.549 m).   Weight as of  10/16/23: 127 lb 14.4 oz (58 kg).   PERFORMANCE STATUS (ECOG) : 2 - Symptomatic, <50% confined to bed   Discussed the use of AI scribe software for clinical note transcription with the patient, who gave verbal consent to proceed.   IMPRESSION:  I connected with Alice Anthony by phone for follow-up. No acute distress. Denies nausea, vomiting, or constipation. Taking things one day at a time. Shares some concern with recent health challenges of her grandson related to visual changes. She and daughter are supporting with work-up with his providers to determine cause and final diagnosis. She is remaining as active as possible. Tries to listen to her body and take breaks if she experiences fatigue and pain. Reports ongoing visual changes described as "cloudy" at times as if something is in her eyes. Clears as day goes on and is intermittent. Recent visit with Opthalmologist with no concerning findings. Some days are better than others. She is using OTC Pataday with offers some improvement and decreases drainage.   We discussed her pain at length. Feels pain is well controlled on current regimen which includes Tylenol as needed and Tramadol. She is tolerating without difficulty. We discussed maximum Tylenol usage daily which includes Tylenol 325mg  versus extra-strength 500mg . She is taking 650mg  as needed for mild pain. She is requiring Tramadol 1-2 tablets daily as her pain worsens with activity. No changes to regimen at this time. We will continue to closely monitor and support as needed.  Goals of Care 5/20- We discussed her current illness and what it means in the larger context of her on-going co-morbidities. Natural disease trajectory and expectations were discussed.   Patient and daughter are realistic in their understanding of current illness. Is remaining hopeful for stability and improve quality of life.   We discussed Her current illness and what it means in the larger context of Her on-going  co-morbidities. Natural disease trajectory and expectations were discussed.  I discussed the importance of continued conversation with family and their medical providers regarding overall plan of care and treatment options, ensuring decisions are within the context of the patients values and GOCs.  PLAN:  Cancer Related Pain Pain is well controlled with use of Tylenol for mild to moderate pain and Tramadol for severe pain. No adjustments made.  -Encourage patient to take breaks and rest as needed. -Continue monitoring pain levels and consider further evaluation if pain persists or worsens. -Tramadol 50-100mg  every 4 hours as needed. -Tylenol ES every 8 hours as needed. Maximum dose of 3000mg  daily.    Follow-up -I will plan to see patient back in 4-6 weeks in collaboration with Oncology visits. She knows to contact office sooner if needed.  Patient expressed understanding  and was in agreement with this plan. She also understands that She can call the clinic at any time with any questions, concerns, or complaints.   Any controlled substances utilized were prescribed in the context of palliative care. PDMP has been reviewed.   Visit consisted of counseling and education dealing with the complex and emotionally intense issues of symptom management and palliative care in the setting of serious and potentially life-threatening illness.  Willette Alma, AGPCNP-BC  Palliative Medicine Team/[facility]

## 2023-10-24 ENCOUNTER — Inpatient Hospital Stay

## 2023-10-25 ENCOUNTER — Inpatient Hospital Stay: Admitting: Nurse Practitioner

## 2023-10-25 ENCOUNTER — Encounter: Payer: Self-pay | Admitting: Nurse Practitioner

## 2023-10-25 DIAGNOSIS — C9000 Multiple myeloma not having achieved remission: Secondary | ICD-10-CM | POA: Diagnosis not present

## 2023-10-25 DIAGNOSIS — G893 Neoplasm related pain (acute) (chronic): Secondary | ICD-10-CM

## 2023-10-25 DIAGNOSIS — Z515 Encounter for palliative care: Secondary | ICD-10-CM | POA: Diagnosis not present

## 2023-10-25 DIAGNOSIS — R530 Neoplastic (malignant) related fatigue: Secondary | ICD-10-CM | POA: Diagnosis not present

## 2023-10-25 NOTE — Progress Notes (Signed)
 Palliative Medicine [facility]  Telephone:(336) [phone] Fax:(336) [phone]   Name: Alice Anthony Date: 10/25/2023 MRN: [id number]  DOB: December 03, 1940  Patient Care Team: [name], MD as PCP - General (Family Medicine) Pickenpack-Cousar, [name], NP as Nurse Practitioner (Nurse Practitioner)   I connected with [name] on 10/25/23 at 11:00 AM EDT by telephone and verified that I am speaking with the correct person using two identifiers.   I discussed the limitations, risks, security and privacy concerns of performing an evaluation and management service by telemedicine and the availability of in-person appointments. I also discussed with the patient that there may be a patient responsible charge related to this service. The patient expressed understanding and agreed to proceed.   Other persons participating in the visit and their role in the encounter: N/A   Patient's location: Home    Provider's location: [facility]   INTERVAL HISTORY: Alice Anthony is a 83 y.o. female with oncologic medical history including recent diagnosis of multiple myeloma (12/2022), DM, and posterior vitreous detachment of left and right eyes. Palliative ask to see for symptom management and goals of care   SOCIAL HISTORY:     reports that she has never smoked. She has never used smokeless tobacco. She reports that she does not drink alcohol and does not use drugs.  ADVANCE DIRECTIVES:  Advanced directives on file  CODE STATUS: Full code  PAST MEDICAL HISTORY: Past Medical History:  Diagnosis Date   Anxiety    Arthritis    Asthma    Hx: of   Cancer (HCC)    Hx: of skin cancer   Diverticulosis    GERD (gastroesophageal reflux disease)    Hx: of   History of kidney stones    Hyperlipemia    Hypertension    Pre-diabetes     ALLERGIES:  is allergic to hydrocodone, vioxx [rofecoxib], codeine, doxycycline hyclate, and latex.  MEDICATIONS:  Current Outpatient Medications   Medication Sig Dispense Refill   acyclovir (ZOVIRAX) 400 MG tablet Take 1 tablet (400 mg total) by mouth 2 (two) times daily. 180 tablet 0   ALPRAZolam (XANAX) 0.25 MG tablet Take 0.25 mg by mouth 3 (three) times daily as needed.     amLODipine (NORVASC) 5 MG tablet Take 5 mg by mouth daily.     aspirin 81 MG tablet Take 81 mg by mouth daily.     bacitracin-neomycin-polymyxin-hydrocortisone (CORTISPORIN) 1 % ophthalmic ointment Place 1 Application into the right eye 2 (two) times daily. 3.5 g 0   Cholecalciferol (VITAMIN D3) 2000 UNITS capsule Take 2,000 Units by mouth daily.     dexamethasone (DECADRON) 4 MG tablet Take 40 mg (10 tablets) by mouth once a week. 40 tablet 3   dextromethorphan-guaiFENesin (TUSSIN DM) 10-100 MG/5ML liquid Take 10 mLs by mouth every 4 (four) hours as needed for cough.     fluticasone (FLONASE) 50 MCG/ACT nasal spray Place 2 sprays into the nose at bedtime.     hydrOXYzine (ATARAX) 25 MG tablet Take 25 mg by mouth at bedtime.     lenalidomide (REVLIMID) 5 MG capsule Take 1 capsule (5 mg total) by mouth daily. [employer] Auth #  [id number]     Date Obtained 10/02/23 Take 1 capsule daily for 21 days then none for 7 days 21 capsule 0   levocetirizine (XYZAL) 5 MG tablet Take 5 mg by mouth every evening.     montelukast (SINGULAIR) 10 MG tablet Take 10 mg  by mouth daily.     Probiotic Product (ALIGN PO) Take by mouth daily.     simvastatin (ZOCOR) 20 MG tablet Take 20 mg by mouth every evening.     traMADol (ULTRAM) 50 MG tablet TAKE 1 TO 2 TABLETS BY MOUTH EVERY 4 HOURS AS NEEDED 90 tablet 0   VEVYE 0.1 % SOLN Place 1 drop into both eyes in the morning and at bedtime.     No current facility-administered medications for this visit.    VITAL SIGNS: There were no vitals taken for this visit. There were no vitals filed for this visit.  Estimated body mass index is 24.17 kg/m as calculated from the following:   Height as of 06/24/23: 5\' 1"  (1.549 m).   Weight as of  10/16/23: 127 lb 14.4 oz (58 kg).   PERFORMANCE STATUS (ECOG) : 2 - Symptomatic, <50% confined to bed  Discussed the use of AI scribe software for clinical note transcription with the patient, who gave verbal consent to proceed.  History of Present Illness I connected by phone with Ms. [name] "Rubie Maid" for follow-up. No acute distress identified. Denies concerns with nausea, vomiting, constipation, or diarrhea. She is remaining as active as possible. Is hopeful as the weather gets warmer she can go outside and work on her flower garden.   She has been off lenalidomide for a month due to a rash that resembled measles.   She experiences daily pain, which she attributes to her activities and environmental factors such as cold and humidity. She has not yet tried Tylenol Arthritis (650 mg) but has purchased it. She currently uses tramadol, which helps, but she is uncertain about its effectiveness when she is active. She has a history of surgery for crushed vertebrae and suspects nerve pinching as a possible cause of her pain, which is alleviated when lying down. Overall her pain is manageable. No adjustments to regimen at this time. Will continue to closely monitor and adjust accordingly.   Goals of Care 5/20- We discussed her current illness and what it means in the larger context of her on-going co-morbidities. Natural disease trajectory and expectations were discussed.   Patient and daughter are realistic in their understanding of current illness. Is remaining hopeful for stability and improve quality of life.   We discussed Her current illness and what it means in the larger context of Her on-going co-morbidities. Natural disease trajectory and expectations were discussed.  I discussed the importance of continued conversation with family and their medical providers regarding overall plan of care and treatment options, ensuring decisions are within the context of the patients values and  GOCs. Assessment & Plan Multiple Myeloma Recent lenalidomide delay due to rash. Rash mostly resolved. Awaiting oncology instructions for reduced dose resumption etc. - Monitor for recurrence of rash or other adverse effects upon resuming lenalidomide.  Chronic Cancer Related Pain Pain likely from vertebral fractures and possible nerve impingement. Tramadol effective. Considering Tylenol 8-hour arthritis for better relief. Pain decreases when she ceases certain activities or lay down.  - Try Tylenol 8-hour arthritis (650 mg) and assess effectiveness. Discussed limitations in use.  - Reassess pain management strategy if Tylenol 8-hour arthritis is ineffective.  Goals of Care Focused on maintaining quality of life and managing symptoms. Desires to remain active and independent. - Support her goals of maintaining independence and quality of life through effective symptom management.  Follow-up Follow-up scheduled for April 9th to reassess condition and pain management. - Evaluate  effectiveness of Tylenol 8-hour arthritis and discuss any further adjustments needed in treatment plan on April 9th.   Patient expressed understanding and was in agreement with this plan. She also understands that She can call the clinic at any time with any questions, concerns, or complaints.   Any controlled substances utilized were prescribed in the context of palliative care. PDMP has been reviewed.   Visit consisted of counseling and education dealing with the complex and emotionally intense issues of symptom management and palliative care in the setting of serious and potentially life-threatening illness.  Willette Alma, AGPCNP-BC  Palliative Medicine Team/ Cancer Center

## 2023-10-29 ENCOUNTER — Other Ambulatory Visit: Payer: Self-pay | Admitting: Hematology and Oncology

## 2023-10-29 DIAGNOSIS — C9000 Multiple myeloma not having achieved remission: Secondary | ICD-10-CM

## 2023-10-30 ENCOUNTER — Other Ambulatory Visit: Payer: Self-pay | Admitting: *Deleted

## 2023-10-30 MED ORDER — LENALIDOMIDE 5 MG PO CAPS: 5.0000 mg | ORAL_CAPSULE | Freq: Every day | ORAL | 0 refills | Status: DC

## 2023-11-12 ENCOUNTER — Ambulatory Visit: Payer: Medicare Other | Admitting: Hematology and Oncology

## 2023-11-12 ENCOUNTER — Other Ambulatory Visit: Payer: Medicare Other

## 2023-11-12 ENCOUNTER — Other Ambulatory Visit: Payer: Self-pay | Admitting: Physician Assistant

## 2023-11-12 ENCOUNTER — Ambulatory Visit: Payer: Medicare Other

## 2023-11-12 DIAGNOSIS — C9000 Multiple myeloma not having achieved remission: Secondary | ICD-10-CM

## 2023-11-13 ENCOUNTER — Inpatient Hospital Stay (HOSPITAL_BASED_OUTPATIENT_CLINIC_OR_DEPARTMENT_OTHER): Payer: Medicare Other | Admitting: Physician Assistant

## 2023-11-13 ENCOUNTER — Inpatient Hospital Stay: Payer: Medicare Other | Attending: Hematology and Oncology

## 2023-11-13 ENCOUNTER — Inpatient Hospital Stay: Admitting: Nurse Practitioner

## 2023-11-13 ENCOUNTER — Inpatient Hospital Stay: Payer: Medicare Other

## 2023-11-13 ENCOUNTER — Encounter: Payer: Self-pay | Admitting: Hematology and Oncology

## 2023-11-13 ENCOUNTER — Other Ambulatory Visit: Payer: Self-pay | Admitting: Hematology and Oncology

## 2023-11-13 VITALS — BP 162/77 | HR 91 | Temp 97.9°F | Resp 18 | Ht 61.0 in | Wt 125.1 lb

## 2023-11-13 DIAGNOSIS — C9000 Multiple myeloma not having achieved remission: Secondary | ICD-10-CM | POA: Insufficient documentation

## 2023-11-13 DIAGNOSIS — Z79899 Other long term (current) drug therapy: Secondary | ICD-10-CM | POA: Insufficient documentation

## 2023-11-13 DIAGNOSIS — Z5112 Encounter for antineoplastic immunotherapy: Secondary | ICD-10-CM | POA: Diagnosis not present

## 2023-11-13 LAB — CBC WITH DIFFERENTIAL (CANCER CENTER ONLY)
Abs Immature Granulocytes: 0.02 10*3/uL (ref 0.00–0.07)
Basophils Absolute: 0 10*3/uL (ref 0.0–0.1)
Basophils Relative: 0 %
Eosinophils Absolute: 0 10*3/uL (ref 0.0–0.5)
Eosinophils Relative: 0 %
HCT: 42.6 % (ref 36.0–46.0)
Hemoglobin: 15.4 g/dL — ABNORMAL HIGH (ref 12.0–15.0)
Immature Granulocytes: 0 %
Lymphocytes Relative: 7 %
Lymphs Abs: 0.4 10*3/uL — ABNORMAL LOW (ref 0.7–4.0)
MCH: 33.5 pg (ref 26.0–34.0)
MCHC: 36.2 g/dL — ABNORMAL HIGH (ref 30.0–36.0)
MCV: 92.6 fL (ref 80.0–100.0)
Monocytes Absolute: 0.1 10*3/uL (ref 0.1–1.0)
Monocytes Relative: 1 %
Neutro Abs: 5.4 10*3/uL (ref 1.7–7.7)
Neutrophils Relative %: 92 %
Platelet Count: 190 10*3/uL (ref 150–400)
RBC: 4.6 MIL/uL (ref 3.87–5.11)
RDW: 13.6 % (ref 11.5–15.5)
WBC Count: 5.9 10*3/uL (ref 4.0–10.5)
nRBC: 0 % (ref 0.0–0.2)

## 2023-11-13 LAB — CMP (CANCER CENTER ONLY)
ALT: 12 U/L (ref 0–44)
AST: 15 U/L (ref 15–41)
Albumin: 4.6 g/dL (ref 3.5–5.0)
Alkaline Phosphatase: 68 U/L (ref 38–126)
Anion gap: 9 (ref 5–15)
BUN: 14 mg/dL (ref 8–23)
CO2: 26 mmol/L (ref 22–32)
Calcium: 9.3 mg/dL (ref 8.9–10.3)
Chloride: 107 mmol/L (ref 98–111)
Creatinine: 0.76 mg/dL (ref 0.44–1.00)
GFR, Estimated: >60 mL/min (ref >60)
Glucose, Bld: 186 mg/dL — ABNORMAL HIGH (ref 70–99)
Potassium: 3.5 mmol/L (ref 3.5–5.1)
Sodium: 142 mmol/L (ref 135–145)
Total Bilirubin: 0.5 mg/dL (ref 0.0–1.2)
Total Protein: 6.7 g/dL (ref 6.5–8.1)

## 2023-11-13 MED ORDER — DARATUMUMAB-HYALURONIDASE-FIHJ 1800-30000 MG-UT/15ML ~~LOC~~ SOLN
1800.0000 mg | Freq: Once | SUBCUTANEOUS | Status: AC
  Administered 2023-11-13: 1800 mg via SUBCUTANEOUS
  Filled 2023-11-13: qty 15

## 2023-11-13 MED ORDER — ACETAMINOPHEN 325 MG PO TABS
650.0000 mg | ORAL_TABLET | Freq: Once | ORAL | Status: DC
  Filled 2023-11-13: qty 2

## 2023-11-13 MED ORDER — MONTELUKAST SODIUM 10 MG PO TABS
10.0000 mg | ORAL_TABLET | Freq: Once | ORAL | Status: DC
  Filled 2023-11-13: qty 1

## 2023-11-13 MED ORDER — DIPHENHYDRAMINE HCL 25 MG PO CAPS
25.0000 mg | ORAL_CAPSULE | Freq: Once | ORAL | Status: DC
  Filled 2023-11-13: qty 1

## 2023-11-13 NOTE — Patient Instructions (Signed)
 [facility] - A DEPT OF [facility]  Discharge Instructions: Thank you for choosing [facility] to provide your oncology and hematology care.   If you have a lab appointment with the Cancer Center, please go directly to the Cancer Center and check in at the registration area.   Wear comfortable clothing and clothing appropriate for easy access to any Portacath or PICC line.   We strive to give you quality time with your provider. You may need to reschedule your appointment if you arrive late (15 or more minutes).  Arriving late affects you and other patients whose appointments are after yours.  Also, if you miss three or more appointments without notifying the office, you may be dismissed from the clinic at the provider's discretion.      For prescription refill requests, have your pharmacy contact our office and allow 72 hours for refills to be completed.    Today you received the following chemotherapy and/or immunotherapy agent: Daratumumab and Hyluronidase (Darzalex Faspro)      To help prevent nausea and vomiting after your treatment, we encourage you to take your nausea medication as directed.  BELOW ARE SYMPTOMS THAT SHOULD BE REPORTED IMMEDIATELY: *FEVER GREATER THAN 100.4 F (38 C) OR HIGHER *CHILLS OR SWEATING *NAUSEA AND VOMITING THAT IS NOT CONTROLLED WITH YOUR NAUSEA MEDICATION *UNUSUAL SHORTNESS OF BREATH *UNUSUAL BRUISING OR BLEEDING *URINARY PROBLEMS (pain or burning when urinating, or frequent urination) *BOWEL PROBLEMS (unusual diarrhea, constipation, pain near the anus) TENDERNESS IN MOUTH AND THROAT WITH OR WITHOUT PRESENCE OF ULCERS (sore throat, sores in mouth, or a toothache) UNUSUAL RASH, SWELLING OR PAIN  UNUSUAL VAGINAL DISCHARGE OR ITCHING   Items with * indicate a potential emergency and should be followed up as soon as possible or go to the Emergency Department if any problems should occur.  Please show the  CHEMOTHERAPY ALERT CARD or IMMUNOTHERAPY ALERT CARD at check-in to the Emergency Department and triage nurse.  Should you have questions after your visit or need to cancel or reschedule your appointment, please contact [facility] - A DEPT OF [facility]  Dept: [phone]  and follow the prompts.  Office hours are 8:00 a.m. to 4:30 p.m. Monday - Friday. Please note that voicemails left after 4:00 p.m. may not be returned until the following business day.  We are closed weekends and major holidays. You have access to a nurse at all times for urgent questions. Please call the main number to the clinic Dept: [phone] and follow the prompts.   For any non-urgent questions, you may also contact your provider using MyChart. We now offer e-Visits for anyone 18 and older to request care online for non-urgent symptoms. For details visit [email or web address].   Also download the MyChart app! Go to the app store, search "MyChart", open the app, select [facility], and log in with your MyChart username and password.  Daratumumab; Hyaluronidase Injection What is this medication? DARATUMUMAB; HYALURONIDASE (dar a toom ue mab; hye al ur ON i dase) treats multiple myeloma, a type of bone marrow cancer. Daratumumab works by blocking a protein that causes cancer cells to grow and multiply. This helps to slow or stop the spread of cancer cells. Hyaluronidase works by increasing the absorption of other medications in the body to help them work better. This medication may also be used treat amyloidosis, a condition that causes the buildup of a protein (  amyloid) in your body. It works by reducing the buildup of this protein, which decreases symptoms. It is a combination medication that contains a monoclonal antibody. This medicine may be used for other purposes; ask your health care provider or pharmacist if you have questions. COMMON BRAND NAME(S): DARZALEX FASPRO What should I tell  my care team before I take this medication? They need to know if you have any of these conditions: Heart disease Infection, such as chickenpox, cold sores, herpes, hepatitis B Lung or breathing disease An unusual or allergic reaction to daratumumab, hyaluronidase, other medications, foods, dyes, or preservatives Pregnant or trying to get pregnant Breast-feeding How should I use this medication? This medication is injected under the skin. It is given by your care team in a hospital or clinic setting. Talk to your care team about the use of this medication in children. Special care may be needed. Overdosage: If you think you have taken too much of this medicine contact a poison control center or emergency room at once. NOTE: This medicine is only for you. Do not share this medicine with others. What if I miss a dose? Keep appointments for follow-up doses. It is important not to miss your dose. Call your care team if you are unable to keep an appointment. What may interact with this medication? Interactions have not been studied. This list may not describe all possible interactions. Give your health care provider a list of all the medicines, herbs, non-prescription drugs, or dietary supplements you use. Also tell them if you smoke, drink alcohol, or use illegal drugs. Some items may interact with your medicine. What should I watch for while using this medication? Your condition will be monitored carefully while you are receiving this medication. This medication can cause serious allergic reactions. To reduce your risk, your care team may give you other medication to take before receiving this one. Be sure to follow the directions from your care team. This medication can affect the results of blood tests to match your blood type. These changes can last for up to 6 months after the final dose. Your care team will do blood tests to match your blood type before you start treatment. Tell all of your  care team that you are being treated with this medication before receiving a blood transfusion. This medication can affect the results of some tests used to determine treatment response; extra tests may be needed to evaluate response. Talk to your care team if you wish to become pregnant or think you are pregnant. This medication can cause serious birth defects if taken during pregnancy and for 3 months after the last dose. A reliable form of contraception is recommended while taking this medication and for 3 months after the last dose. Talk to your care team about effective forms of contraception. Do not breast-feed while taking this medication. What side effects may I notice from receiving this medication? Side effects that you should report to your care team as soon as possible: Allergic reactions--skin rash, itching, hives, swelling of the face, lips, tongue, or throat Heart rhythm changes--fast or irregular heartbeat, dizziness, feeling faint or lightheaded, chest pain, trouble breathing Infection--fever, chills, cough, sore throat, wounds that don't heal, pain or trouble when passing urine, general feeling of discomfort or being unwell Infusion reactions--chest pain, shortness of breath or trouble breathing, feeling faint or lightheaded Sudden eye pain or change in vision such as blurry vision, seeing halos around lights, vision loss Unusual bruising or bleeding Side effects  that usually do not require medical attention (report to your care team if they continue or are bothersome): Constipation Diarrhea Fatigue Nausea Pain, tingling, or numbness in the hands or feet Swelling of the ankles, hands, or feet This list may not describe all possible side effects. Call your doctor for medical advice about side effects. You may report side effects to FDA at 1-800-FDA-1088. Where should I keep my medication? This medication is given in a hospital or clinic. It will not be stored at home. NOTE:  This sheet is a summary. It may not cover all possible information. If you have questions about this medicine, talk to your doctor, pharmacist, or health care provider.  2024 Elsevier/Gold Standard (2021-11-28 00:00:00) Zoledronic Acid Injection (Cancer) What is this medication? ZOLEDRONIC ACID (ZOE le dron ik AS id) treats high calcium levels in the blood caused by cancer. It may also be used with chemotherapy to treat weakened bones caused by cancer. It works by slowing down the release of calcium from bones. This lowers calcium levels in your blood. It also makes your bones stronger and less likely to break (fracture). It belongs to a group of medications called bisphosphonates. This medicine may be used for other purposes; ask your health care provider or pharmacist if you have questions. COMMON BRAND NAME(S): Zometa, Zometa Powder What should I tell my care team before I take this medication? They need to know if you have any of these conditions: Dehydration Dental disease Kidney disease Liver disease Low levels of calcium in the blood Lung or breathing disease, such as asthma Receiving steroids, such as dexamethasone or prednisone An unusual or allergic reaction to zoledronic acid, other medications, foods, dyes, or preservatives Pregnant or trying to get pregnant Breast-feeding How should I use this medication? This medication is injected into a vein. It is given by your care team in a hospital or clinic setting. Talk to your care team about the use of this medication in children. Special care may be needed. Overdosage: If you think you have taken too much of this medicine contact a poison control center or emergency room at once. NOTE: This medicine is only for you. Do not share this medicine with others. What if I miss a dose? Keep appointments for follow-up doses. It is important not to miss your dose. Call your care team if you are unable to keep an appointment. What may  interact with this medication? Certain antibiotics given by injection Diuretics, such as bumetanide, furosemide NSAIDs, medications for pain and inflammation, such as ibuprofen or naproxen Teriparatide Thalidomide This list may not describe all possible interactions. Give your health care provider a list of all the medicines, herbs, non-prescription drugs, or dietary supplements you use. Also tell them if you smoke, drink alcohol, or use illegal drugs. Some items may interact with your medicine. What should I watch for while using this medication? Visit your care team for regular checks on your progress. It may be some time before you see the benefit from this medication. Some people who take this medication have severe bone, joint, or muscle pain. This medication may also increase your risk for jaw problems or a broken thigh bone. Tell your care team right away if you have severe pain in your jaw, bones, joints, or muscles. Tell you care team if you have any pain that does not go away or that gets worse. Tell your dentist and dental surgeon that you are taking this medication. You should not have major dental  surgery while on this medication. See your dentist to have a dental exam and fix any dental problems before starting this medication. Take good care of your teeth while on this medication. Make sure you see your dentist for regular follow-up appointments. You should make sure you get enough calcium and vitamin D while you are taking this medication. Discuss the foods you eat and the vitamins you take with your care team. Check with your care team if you have severe diarrhea, nausea, and vomiting, or if you sweat a lot. The loss of too much body fluid may make it dangerous for you to take this medication. You may need bloodwork while taking this medication. Talk to your care team if you wish to become pregnant or think you might be pregnant. This medication can cause serious birth defects. What  side effects may I notice from receiving this medication? Side effects that you should report to your care team as soon as possible: Allergic reactions--skin rash, itching, hives, swelling of the face, lips, tongue, or throat Kidney injury--decrease in the amount of urine, swelling of the ankles, hands, or feet Low calcium level--muscle pain or cramps, confusion, tingling, or numbness in the hands or feet Osteonecrosis of the jaw--pain, swelling, or redness in the mouth, numbness of the jaw, poor healing after dental work, unusual discharge from the mouth, visible bones in the mouth Severe bone, joint, or muscle pain Side effects that usually do not require medical attention (report to your care team if they continue or are bothersome): Constipation Fatigue Fever Loss of appetite Nausea Stomach pain This list may not describe all possible side effects. Call your doctor for medical advice about side effects. You may report side effects to FDA at 1-800-FDA-1088. Where should I keep my medication? This medication is given in a hospital or clinic. It will not be stored at home. NOTE: This sheet is a summary. It may not cover all possible information. If you have questions about this medicine, talk to your doctor, pharmacist, or health care provider.  2024 Elsevier/Gold Standard (2021-09-15 00:00:00)

## 2023-11-13 NOTE — Progress Notes (Signed)
 [facility] [phone]   [phone]  PROGRESS NOTE  Patient Care Team: [name], MD as PCP - General (Family Medicine) Pickenpack-Cousar, [name], NP as Nurse Practitioner (Nurse Practitioner)  Hematological/Oncological History # Plasmacytoma of Thoracic Spine  # IgG Kappa Multiple myeloma 09/22/2022: Patient underwent MRI thoracic spine which showed acute or subacute T7, T10, and T11 compression fractures 10/10/2022: biopsy of compression fracture of T11 reveals a plasmacytoma 11/13/2022: establish care with Dr. Leonides Schanz  12/06/2022: bone marrow biopsy showed plasma cell myeloma involving 80% of the marrow.  12/24/2022: Cycle 1 Day 1 of Velcade/Dex 01/14/2023: Cycle 2 Day 1 of Velcade/Dex 02/04/2023: Cycle 3 Day 1 of Velcade/Dex 02/26/2023: Cycle 4 Day 1 of Velcade/Dex HELD due to ocular toxicity 03/04/2023: Cycle 1 Day 1 of Dara/Dex  04/01/2023: Cycle 2 Day 1 of Dara/Dex  04/29/2023: Cycle 3 Day 1 of Dara/Dex  05/27/2023: Cycle 4 Day 1 of Dara/Dex  06/24/2023: Cycle 5 Day 1 of Dara/Dex  07/23/2023: Cycle 6 Day 1 of Dara/Rev/Dex  08/20/2023: Cycle 7 Day 1 of Dara/Rev/Dex  09/18/2023 Cycle 8 Day 1 of Dara/Rev/Dex  10/16/2023: Cycle 9 Day 1 of Dara/Rev/Dex  11/13/2023: Cycle 10 Day 1 of Dara/Rev/Dex   Interval History:  Alice Anthony 83 y.o. female with medical history significant for newly diagnosed IgG kappa multiple myeloma who presents for a follow up visit. The patient's last visit was on 10/16/2023.  In the interim since the last visit she has continued on Dara/Rev/Dex therapy.   On exam today Alice Anthony is accompanied by her daughter today.  She reports that overall she is tolerating treatment including lower dose of Revlimid. She reports her energy levels are fairly stable although she admits she does overdo it. She has a good appetite without any significant weight changes. She denies nausea, vomiting or bowel habit changes. She denies easy  bruising or signs of bleeding. She reports a flare up of her back pain after picking some flower recently. She takes tramadol 50 mg twice a day which does improve her pain. Overall she is willing and able to continue with chemotherapy at this time.  A full 10 point ROS is otherwise negative.    MEDICAL HISTORY:  Past Medical History:  Diagnosis Date   Anxiety    Arthritis    Asthma    Hx: of   Cancer (HCC)    Hx: of skin cancer   Diverticulosis    GERD (gastroesophageal reflux disease)    Hx: of   History of kidney stones    Hyperlipemia    Hypertension    Pre-diabetes     SURGICAL HISTORY: Past Surgical History:  Procedure Laterality Date   ABDOMINAL SURGERY  09/19/2012   Ruptured Diverticuli, Ostomy Placed.   BREAST BIOPSY  06/06/2012   breast biopsy   CATARACT EXTRACTION W/ INTRAOCULAR LENS IMPLANT Bilateral    COLONOSCOPY  03/06/2012   COLOSTOMY CLOSURE  12/25/2012   Dr Luisa Hart   COLOSTOMY CLOSURE N/A 12/25/2012   Procedure: COLOSTOMY CLOSURE;  Surgeon: Clovis Pu. Cornett, MD;  Location: [facility] OR;  Service: General;  Laterality: N/A;   EXTRACORPOREAL SHOCK WAVE LITHOTRIPSY Left 11/17/2018   Procedure: EXTRACORPOREAL SHOCK WAVE LITHOTRIPSY (ESWL);  Surgeon: Malen Gauze, MD;  Location: [facility] ORS;  Service: Urology;  Laterality: Left;   EYE SURGERY  10/19/1998   Hx: of cyst removed from left eye   KYPHOPLASTY N/A 10/10/2022   Procedure: KYPHOPLASTY Thoracic seven , Thoracic ten,Thoracic eleven;  Surgeon:  Tressie Stalker, MD;  Location: [facility] OR;  Service: Neurosurgery;  Laterality: N/A;   LAPAROTOMY N/A 09/19/2012   Procedure: EXPLORATORY LAPAROTOMY, sigmoid colectomy;  Surgeon: Clovis Pu. Cornett, MD;  Location: [facility] ORS;  Service: General;  Laterality: N/A;   SIGMOIDOSCOPY  01/11/1999   TONSILLECTOMY     TRIGGER FINGER RELEASE  12/25/2010   TUBAL LIGATION  10/16/1985    SOCIAL HISTORY: Social History   Socioeconomic History   Marital status: Widowed    Spouse name:  Not on file   Number of children: Not on file   Years of education: Not on file   Highest education level: Not on file  Occupational History   Not on file  Tobacco Use   Smoking status: Never   Smokeless tobacco: Never  Vaping Use   Vaping status: Never Used  Substance and Sexual Activity   Alcohol use: No   Drug use: No   Sexual activity: Not Currently  Other Topics Concern   Not on file  Social History Narrative   Not on file   Social Drivers of Health   Financial Resource Strain: Not on file  Food Insecurity: Not on file  Transportation Needs: Not on file  Physical Activity: Not on file  Stress: Not on file  Social Connections: Not on file  Intimate Partner Violence: Not on file    FAMILY HISTORY: Family History  Problem Relation Age of Onset   Diabetes Father    Alzheimer's disease Father    Basal cell carcinoma Mother        Metastatic    ALLERGIES:  is allergic to hydrocodone, vioxx [rofecoxib], codeine, doxycycline hyclate, and latex.  MEDICATIONS:  Current Outpatient Medications  Medication Sig Dispense Refill   acyclovir (ZOVIRAX) 400 MG tablet Take 1 tablet (400 mg total) by mouth 2 (two) times daily. 180 tablet 0   ALPRAZolam (XANAX) 0.25 MG tablet Take 0.25 mg by mouth 3 (three) times daily as needed.     amLODipine (NORVASC) 5 MG tablet Take 5 mg by mouth daily.     aspirin 81 MG tablet Take 81 mg by mouth daily.     cetirizine (ZYRTEC) 10 MG tablet Take 10 mg by mouth daily.     Cholecalciferol (VITAMIN D3) 2000 UNITS capsule Take 2,000 Units by mouth daily.     dexamethasone (DECADRON) 4 MG tablet Take 40 mg (10 tablets) by mouth once a week. 40 tablet 3   dextromethorphan-guaiFENesin (TUSSIN DM) 10-100 MG/5ML liquid Take 10 mLs by mouth every 4 (four) hours as needed for cough.     fluticasone (FLONASE) 50 MCG/ACT nasal spray Place 2 sprays into the nose at bedtime.     hydrOXYzine (ATARAX) 25 MG tablet Take 25 mg by mouth at bedtime.      lenalidomide (REVLIMID) 5 MG capsule Take 1 capsule (5 mg total) by mouth daily. [employer] Auth # [id number]   Date Obtained 10/30/23 Take 1 capsule daily for 21 days then none for 7 days 21 capsule 0   montelukast (SINGULAIR) 10 MG tablet Take 10 mg by mouth daily.     Probiotic Product (ALIGN PO) Take by mouth daily.     simvastatin (ZOCOR) 20 MG tablet Take 20 mg by mouth every evening.     traMADol (ULTRAM) 50 MG tablet TAKE 1 TO 2 TABLETS BY MOUTH EVERY 4 HOURS AS NEEDED 90 tablet 0   VEVYE 0.1 % SOLN Place 1 drop into both eyes in the morning and at bedtime.  levocetirizine (XYZAL) 5 MG tablet Take 5 mg by mouth every evening. (Patient not taking: Reported on 11/13/2023)     No current facility-administered medications for this visit.    REVIEW OF SYSTEMS:   Constitutional: ( - ) fevers, ( - )  chills , ( - ) night sweats Eyes: ( - ) blurriness of vision, ( - ) double vision, ( - ) watery eyes Ears, nose, mouth, throat, and face: ( - ) mucositis, ( - ) sore throat Respiratory: ( - ) cough, ( - ) dyspnea, ( - ) wheezes Cardiovascular: ( - ) palpitation, ( - ) chest discomfort, ( - ) lower extremity swelling Gastrointestinal:  ( - ) nausea, ( - ) heartburn, ( - ) change in bowel habits Skin: ( - ) abnormal skin rashes Lymphatics: ( - ) new lymphadenopathy, ( - ) easy bruising Neurological: ( - ) numbness, ( - ) tingling, ( - ) new weaknesses Behavioral/Psych: ( - ) mood change, ( - ) new changes  All other systems were reviewed with the patient and are negative.  PHYSICAL EXAMINATION: ECOG PERFORMANCE STATUS: 1 - Symptomatic but completely ambulatory  Vitals:   11/13/23 1309  BP: (!) 162/77  Pulse: 91  Resp: 18  Temp: 97.9 F (36.6 C)  SpO2: 97%        Filed Weights   11/13/23 1309  Weight: 125 lb 1.6 oz (56.7 kg)     GENERAL: Well-appearing elderly Caucasian female, alert, no distress and comfortable SKIN: skin color, texture, turgor are normal, no rashes or  significant lesions EYES: conjunctiva are pink and non-injected, sclera clear.  LUNGS: clear to auscultation and percussion with normal breathing effort HEART: regular rate & rhythm and no murmurs and no lower extremity edema Musculoskeletal: no cyanosis of digits and no clubbing  PSYCH: alert & oriented x 3, fluent speech NEURO: no focal motor/sensory deficits  LABORATORY DATA:  I have reviewed the data as listed    Latest Ref Rng & Units 11/13/2023   12:41 PM 10/16/2023    2:36 PM 09/18/2023    1:02 PM  CBC  WBC 4.0 - 10.5 K/uL 5.9  2.9  4.1   Hemoglobin 12.0 - 15.0 g/dL 56.2  13.0  86.5   Hematocrit 36.0 - 46.0 % 42.6  42.4  44.8   Platelets 150 - 400 K/uL 190  195  161        Latest Ref Rng & Units 10/16/2023    2:36 PM 09/18/2023    1:02 PM 08/20/2023    9:51 AM  CMP  Glucose 70 - 99 mg/dL 784  696  295   BUN 8 - 23 mg/dL 15  13  11    Creatinine 0.44 - 1.00 mg/dL 2.84  1.32  4.40   Sodium 135 - 145 mmol/L 139  139  141   Potassium 3.5 - 5.1 mmol/L 3.7  3.8  4.3   Chloride 98 - 111 mmol/L 106  107  107   CO2 22 - 32 mmol/L 25  25  28    Calcium 8.9 - 10.3 mg/dL 8.7  9.2  9.2   Total Protein 6.5 - 8.1 g/dL 6.3  6.7  6.1   Total Bilirubin 0.0 - 1.2 mg/dL 0.6  0.6  0.6   Alkaline Phos 38 - 126 U/L 59  63  54   AST 15 - 41 U/L 17  16  15    ALT 0 - 44 U/L 12  12  11  Lab Results  Component Value Date   MPROTEIN 0.1 (H) 10/16/2023   MPROTEIN 0.1 (H) 09/18/2023   MPROTEIN 0.1 (H) 08/20/2023   Lab Results  Component Value Date   KPAFRELGTCHN 62.6 (H) 10/16/2023   KPAFRELGTCHN 86.3 (H) 09/18/2023   KPAFRELGTCHN 89.0 (H) 08/20/2023   LAMBDASER 2.6 (L) 10/16/2023   LAMBDASER 2.6 (L) 09/18/2023   LAMBDASER 3.0 (L) 08/20/2023   KAPLAMBRATIO 24.08 (H) 10/16/2023   KAPLAMBRATIO 33.19 (H) 09/18/2023   KAPLAMBRATIO 29.67 (H) 08/20/2023   Lab Results  Component Value Date   MPROTEIN 0.1 (H) 10/16/2023   MPROTEIN 0.1 (H) 09/18/2023   MPROTEIN 0.1 (H) 08/20/2023   Lab  Results  Component Value Date   KPAFRELGTCHN 62.6 (H) 10/16/2023   KPAFRELGTCHN 86.3 (H) 09/18/2023   KPAFRELGTCHN 89.0 (H) 08/20/2023   LAMBDASER 2.6 (L) 10/16/2023   LAMBDASER 2.6 (L) 09/18/2023   LAMBDASER 3.0 (L) 08/20/2023   KAPLAMBRATIO 24.08 (H) 10/16/2023   KAPLAMBRATIO 33.19 (H) 09/18/2023   KAPLAMBRATIO 29.67 (H) 08/20/2023     RADIOGRAPHIC STUDIES: No results found.  ASSESSMENT & PLAN Alice Anthony 83 y.o. female with medical history significant for IgG kappa multiple myeloma who presents for a follow up visit.   # IgG Kappa Multiple Myeloma # Plasmacytoma of Spine  -- Patient diagnosed with plasmacytoma of the spine, subsequent bone marrow biopsy confirmed an IgG kappa multiple myeloma involving the bone marrow --start of Velcade/Dex Cycle 1 Day 1 on 12/24/2022. --Due to ocular toxicity, recommended to discontinue Velcade therapy and switch to Daratumumab.  --Started Revlimid on 07/17/2023 at 10 mg PO daily. Dose reduced to 5 mg PO daily on 10/30/2023 due to rash.  Plan: --Due for Cycle 10, Day 1 of Dara/Rev/Dex --Tolerating lower dose of Revlimid without any new toxicities.  --Labs today show WBC 5.9, hemoglobin 15.4, MCV 92.6, platelets 190, Creatinine and LFTs are normal.  --Last M protein 0.1 on 10/16/2023 with kappa free light chains decreased from 86.3 to 62.6. -- Return to clinic in 4 weeks with continued Dara/Dex.    #Ocular toxicity/Stye --Secondary to Velcade therapy --Used Moxifloxacin drops with improvement --Received azithromycin x 5 days.   #Supportive Care -- chemotherapy education complete -- port placement not required   -- zofran 8mg  q8H PRN and compazine 10mg  PO q6H for nausea -- acyclovir 400mg  PO BID for VCZ prophylaxis -- Recieved dental clearance prior to the start of Xgeva/Zometa. First dose on 01/07/2023. Last dose on 10/16/2023. Next dose in June 2025  -- Tramadol 50 mg every 6 hours as needed for pain control.  Currently taking 2  daily.  No orders of the defined types were placed in this encounter.  All questions were answered. The patient knows to call the clinic with any problems, questions or concerns.  I have spent a total of 30 minutes minutes of face-to-face and non-face-to-face time, preparing to see the patient, performing a medically appropriate examination, counseling and educating the patient,  documenting clinical information in the electronic health record,and care coordination.   Georga Kaufmann PA-C Dept of Hematology and Oncology [facility] Phone: [phone]    11/13/2023 1:17 PM

## 2023-11-14 ENCOUNTER — Encounter: Payer: Self-pay | Admitting: Physician Assistant

## 2023-11-14 LAB — KAPPA/LAMBDA LIGHT CHAINS
Kappa free light chain: 56.5 mg/L — ABNORMAL HIGH (ref 3.3–19.4)
Kappa, lambda light chain ratio: 17.66 — ABNORMAL HIGH (ref 0.26–1.65)
Lambda free light chains: 3.2 mg/L — ABNORMAL LOW (ref 5.7–26.3)

## 2023-11-15 ENCOUNTER — Other Ambulatory Visit: Payer: Self-pay

## 2023-11-17 LAB — MULTIPLE MYELOMA PANEL, SERUM
Albumin SerPl Elph-Mcnc: 4.2 g/dL (ref 2.9–4.4)
Albumin/Glob SerPl: 2 — ABNORMAL HIGH (ref 0.7–1.7)
Alpha 1: 0.3 g/dL (ref 0.0–0.4)
Alpha2 Glob SerPl Elph-Mcnc: 0.8 g/dL (ref 0.4–1.0)
B-Globulin SerPl Elph-Mcnc: 0.9 g/dL (ref 0.7–1.3)
Gamma Glob SerPl Elph-Mcnc: 0.3 g/dL — ABNORMAL LOW (ref 0.4–1.8)
Globulin, Total: 2.2 g/dL (ref 2.2–3.9)
IgA: 17 mg/dL — ABNORMAL LOW (ref 64–422)
IgG (Immunoglobin G), Serum: 362 mg/dL — ABNORMAL LOW (ref 586–1602)
IgM (Immunoglobulin M), Srm: 20 mg/dL — ABNORMAL LOW (ref 26–217)
M Protein SerPl Elph-Mcnc: 0.1 g/dL — ABNORMAL HIGH
Total Protein ELP: 6.4 g/dL (ref 6.0–8.5)

## 2023-11-21 ENCOUNTER — Other Ambulatory Visit: Payer: Self-pay | Admitting: *Deleted

## 2023-11-21 ENCOUNTER — Other Ambulatory Visit: Payer: Self-pay | Admitting: Hematology and Oncology

## 2023-11-21 MED ORDER — LENALIDOMIDE 5 MG PO CAPS: 5.0000 mg | ORAL_CAPSULE | Freq: Every day | ORAL | 0 refills | Status: DC

## 2023-11-26 DIAGNOSIS — Z0000 Encounter for general adult medical examination without abnormal findings: Secondary | ICD-10-CM | POA: Diagnosis not present

## 2023-11-26 DIAGNOSIS — F331 Major depressive disorder, recurrent, moderate: Secondary | ICD-10-CM | POA: Diagnosis not present

## 2023-11-26 DIAGNOSIS — C9030 Solitary plasmacytoma not having achieved remission: Secondary | ICD-10-CM | POA: Diagnosis not present

## 2023-11-26 DIAGNOSIS — Z79899 Other long term (current) drug therapy: Secondary | ICD-10-CM | POA: Diagnosis not present

## 2023-11-26 DIAGNOSIS — E559 Vitamin D deficiency, unspecified: Secondary | ICD-10-CM | POA: Diagnosis not present

## 2023-11-26 DIAGNOSIS — C9000 Multiple myeloma not having achieved remission: Secondary | ICD-10-CM | POA: Diagnosis not present

## 2023-11-26 DIAGNOSIS — M353 Polymyalgia rheumatica: Secondary | ICD-10-CM | POA: Diagnosis not present

## 2023-11-26 DIAGNOSIS — E1169 Type 2 diabetes mellitus with other specified complication: Secondary | ICD-10-CM | POA: Diagnosis not present

## 2023-11-26 DIAGNOSIS — S22070A Wedge compression fracture of T9-T10 vertebra, initial encounter for closed fracture: Secondary | ICD-10-CM | POA: Diagnosis not present

## 2023-11-28 ENCOUNTER — Encounter: Payer: Self-pay | Admitting: Family Medicine

## 2023-12-03 ENCOUNTER — Other Ambulatory Visit: Payer: Self-pay | Admitting: Nurse Practitioner

## 2023-12-03 DIAGNOSIS — Z515 Encounter for palliative care: Secondary | ICD-10-CM

## 2023-12-03 DIAGNOSIS — G893 Neoplasm related pain (acute) (chronic): Secondary | ICD-10-CM

## 2023-12-03 DIAGNOSIS — C9000 Multiple myeloma not having achieved remission: Secondary | ICD-10-CM

## 2023-12-11 ENCOUNTER — Inpatient Hospital Stay: Payer: Medicare Other | Attending: Hematology and Oncology

## 2023-12-11 ENCOUNTER — Inpatient Hospital Stay (HOSPITAL_BASED_OUTPATIENT_CLINIC_OR_DEPARTMENT_OTHER): Admitting: Nurse Practitioner

## 2023-12-11 ENCOUNTER — Encounter: Payer: Self-pay | Admitting: Nurse Practitioner

## 2023-12-11 ENCOUNTER — Encounter: Payer: Self-pay | Admitting: Hematology and Oncology

## 2023-12-11 ENCOUNTER — Other Ambulatory Visit: Payer: Self-pay | Admitting: Hematology

## 2023-12-11 ENCOUNTER — Inpatient Hospital Stay (HOSPITAL_BASED_OUTPATIENT_CLINIC_OR_DEPARTMENT_OTHER): Payer: Medicare Other | Admitting: Physician Assistant

## 2023-12-11 ENCOUNTER — Inpatient Hospital Stay: Payer: Medicare Other

## 2023-12-11 VITALS — BP 148/77 | HR 77 | Temp 97.7°F | Resp 14 | Wt 126.1 lb

## 2023-12-11 DIAGNOSIS — C9000 Multiple myeloma not having achieved remission: Secondary | ICD-10-CM

## 2023-12-11 DIAGNOSIS — G893 Neoplasm related pain (acute) (chronic): Secondary | ICD-10-CM

## 2023-12-11 DIAGNOSIS — Z5112 Encounter for antineoplastic immunotherapy: Secondary | ICD-10-CM | POA: Diagnosis not present

## 2023-12-11 DIAGNOSIS — Z7189 Other specified counseling: Secondary | ICD-10-CM

## 2023-12-11 DIAGNOSIS — Z515 Encounter for palliative care: Secondary | ICD-10-CM

## 2023-12-11 DIAGNOSIS — R530 Neoplastic (malignant) related fatigue: Secondary | ICD-10-CM

## 2023-12-11 DIAGNOSIS — Z79899 Other long term (current) drug therapy: Secondary | ICD-10-CM | POA: Insufficient documentation

## 2023-12-11 LAB — CMP (CANCER CENTER ONLY)
ALT: 17 U/L (ref 0–44)
AST: 20 U/L (ref 15–41)
Albumin: 4.7 g/dL (ref 3.5–5.0)
Alkaline Phosphatase: 60 U/L (ref 38–126)
Anion gap: 9 (ref 5–15)
BUN: 12 mg/dL (ref 8–23)
CO2: 27 mmol/L (ref 22–32)
Calcium: 9.1 mg/dL (ref 8.9–10.3)
Chloride: 104 mmol/L (ref 98–111)
Creatinine: 0.79 mg/dL (ref 0.44–1.00)
GFR, Estimated: >60 mL/min (ref >60)
Glucose, Bld: 163 mg/dL — ABNORMAL HIGH (ref 70–99)
Potassium: 3.7 mmol/L (ref 3.5–5.1)
Sodium: 140 mmol/L (ref 135–145)
Total Bilirubin: 0.6 mg/dL (ref 0.0–1.2)
Total Protein: 6.6 g/dL (ref 6.5–8.1)

## 2023-12-11 LAB — CBC WITH DIFFERENTIAL (CANCER CENTER ONLY)
Abs Immature Granulocytes: 0.01 10*3/uL (ref 0.00–0.07)
Basophils Absolute: 0.1 10*3/uL (ref 0.0–0.1)
Basophils Relative: 1 %
Eosinophils Absolute: 0.1 10*3/uL (ref 0.0–0.5)
Eosinophils Relative: 3 %
HCT: 43.2 % (ref 36.0–46.0)
Hemoglobin: 15.5 g/dL — ABNORMAL HIGH (ref 12.0–15.0)
Immature Granulocytes: 0 %
Lymphocytes Relative: 13 %
Lymphs Abs: 0.6 10*3/uL — ABNORMAL LOW (ref 0.7–4.0)
MCH: 33.1 pg (ref 26.0–34.0)
MCHC: 35.9 g/dL (ref 30.0–36.0)
MCV: 92.3 fL (ref 80.0–100.0)
Monocytes Absolute: 0.2 10*3/uL (ref 0.1–1.0)
Monocytes Relative: 4 %
Neutro Abs: 3.5 10*3/uL (ref 1.7–7.7)
Neutrophils Relative %: 79 %
Platelet Count: 188 10*3/uL (ref 150–400)
RBC: 4.68 MIL/uL (ref 3.87–5.11)
RDW: 13.6 % (ref 11.5–15.5)
WBC Count: 4.5 10*3/uL (ref 4.0–10.5)
nRBC: 0 % (ref 0.0–0.2)

## 2023-12-11 MED ORDER — DARATUMUMAB-HYALURONIDASE-FIHJ 1800-30000 MG-UT/15ML ~~LOC~~ SOLN
1800.0000 mg | Freq: Once | SUBCUTANEOUS | Status: AC
  Administered 2023-12-11: 1800 mg via SUBCUTANEOUS
  Filled 2023-12-11: qty 15

## 2023-12-11 NOTE — Progress Notes (Signed)
 Palliative Medicine [facility]  Telephone:([phone] [phone]   Name: Alice Anthony Date: 12/11/2023 MRN: [id number]  DOB: 1941/01/20  Patient Care Team: Olin Bertin, MD as PCP - General (Family Medicine) Pickenpack-Cousar, Giles Labrum, NP as Nurse Practitioner (Nurse Practitioner)   INTERVAL HISTORY: Alice Anthony is a 83 y.o. female with oncologic medical history including recent diagnosis of multiple myeloma (12/2022), DM, and posterior vitreous detachment of left and right eyes. Palliative ask to see for symptom management and goals of care   SOCIAL HISTORY:     reports that she has never smoked. She has never used smokeless tobacco. She reports that she does not drink alcohol and does not use drugs.  ADVANCE DIRECTIVES:  Advanced directives on file  CODE STATUS: Full code  PAST MEDICAL HISTORY: Past Medical History:  Diagnosis Date   Anxiety    Arthritis    Asthma    Hx: of   Cancer (HCC)    Hx: of skin cancer   Diverticulosis    GERD (gastroesophageal reflux disease)    Hx: of   History of kidney stones    Hyperlipemia    Hypertension    Pre-diabetes     ALLERGIES:  is allergic to hydrocodone , vioxx [rofecoxib], codeine, doxycycline hyclate, latex, and revlimid  [lenalidomide ].  MEDICATIONS:  Current Outpatient Medications  Medication Sig Dispense Refill   acyclovir  (ZOVIRAX ) 400 MG tablet Take 1 tablet (400 mg total) by mouth 2 (two) times daily. 180 tablet 0   ALPRAZolam  (XANAX ) 0.25 MG tablet Take 0.25 mg by mouth 3 (three) times daily as needed.     amLODipine  (NORVASC ) 5 MG tablet Take 5 mg by mouth daily.     aspirin  81 MG tablet Take 81 mg by mouth daily.     cetirizine (ZYRTEC) 10 MG tablet Take 10 mg by mouth daily.     Cholecalciferol (VITAMIN D3) 2000 UNITS capsule Take 2,000 Units by mouth daily.     dexamethasone  (DECADRON ) 4 MG tablet Take 40 mg (10 tablets) by mouth once a week. 40 tablet 3    dextromethorphan-guaiFENesin  (TUSSIN DM) 10-100 MG/5ML liquid Take 10 mLs by mouth every 4 (four) hours as needed for cough.     fluticasone  (FLONASE ) 50 MCG/ACT nasal spray Place 2 sprays into the nose at bedtime.     hydrOXYzine  (ATARAX ) 25 MG tablet Take 25 mg by mouth at bedtime.     Insulin  Regular Human (NOVOLIN R FLEXPEN IJ) Inject 10 Units as directed as needed. 10 units when blood sugar is 200 or above     lenalidomide  (REVLIMID ) 5 MG capsule Take 1 capsule (5 mg total) by mouth daily. [employer] Auth #  [id number]  Date Obtained 11/21/23 Take 1 capsule daily for 21 days then none for 7 days 21 capsule 0   montelukast  (SINGULAIR ) 10 MG tablet Take 10 mg by mouth daily.     Probiotic Product (ALIGN PO) Take by mouth daily.     simvastatin  (ZOCOR ) 20 MG tablet Take 20 mg by mouth every evening.     traMADol  (ULTRAM ) 50 MG tablet TAKE 1 TO 2 TABLETS BY MOUTH EVERY 4 HOURS AS NEEDED 90 tablet 0   VEVYE 0.1 % SOLN Place 1 drop into both eyes in the morning and at bedtime.     No current facility-administered medications for this visit.    VITAL SIGNS: There were no vitals taken for this visit. There were no vitals filed for this  visit.  Estimated body mass index is 23.83 kg/m as calculated from the following:   Height as of 11/13/23: 5\' 1"  (1.549 m).   Weight as of an earlier encounter on 12/11/23: 126 lb 1.6 oz (57.2 kg).   PERFORMANCE STATUS (ECOG) : 2 - Symptomatic, <50% confined to bed  Discussed the use of AI scribe software for clinical note transcription with the patient, who gave verbal consent to proceed.  History of Present Illness Alice Anthony "Alice Anthony" is an 83 year old female who presents with concerns about medication management and hydration. She is accompanied by her son, April Bayard. She has not experienced any falls but uses a cane to prevent them. She has not had any issues with constipation recently, although she notes that her medications can cause her intestinal tract.   Denies concerns of nausea or vomiting.  Occasional fatigue however despite this she continues to remain active doing things around her home. She is considering a family trip to the beach but is concerned about her ability to travel and manage her medication needs while away.  She is currently undergoing chemotherapy and is concerned about the safe disposal of her medication. She is cautious about handling the medication due to its potential danger and has not opened the bag containing it. Patient advised we will dispose unused oral chemotherapy accordingly.   She experiences cramping in the back of her legs in the morning, which she attributes to a lack of hydration. She admits to not drinking enough water and is trying to improve her water intake with the help of her son, who reminds her to drink more.  We discussed the use of large cup or bottle allowing patient to have a visual of how much water she had to drink each day and sitting goals.  [name] takes two tramadol  once a day for pain management but has not yet tried Tylenol  Arthritis (8-hour formulation) due to concerns about its efficacy. She is unsure about the timing of taking tramadol  in conjunction with Tylenol . She can take additional tramadol  four hours after the initial dose if her pain persists.  Education provided on the use of Tylenol  in addition to tramadol  as needed for mild to moderate pain.  Patient verbalized understanding and appreciation.  All questions answered and support provided.  Goals of Care 5/20- We discussed her current illness and what it means in the larger context of her on-going co-morbidities. Natural disease trajectory and expectations were discussed.   Patient and daughter are realistic in their understanding of current illness. Is remaining hopeful for stability and improve quality of life.   We discussed Her current illness and what it means in the larger context of Her on-going co-morbidities. Natural  disease trajectory and expectations were discussed.  I discussed the importance of continued conversation with family and their medical providers regarding overall plan of care and treatment options, ensuring decisions are within the context of the patients values and GOCs. Assessment & Plan Pain management with Tramadol  and Tylenol  Chronic pain managed with as needed tramadol .  Generally requiring once a day.  Discussed safe concurrent use and dosing flexibility. - Educated on combined use of Tylenol  Arthritis and Tramadol . - Advised Tramadol  every 4 hours PRN for severe pain. - Encouraged trial of Tylenol  Arthritis. - We will continue to closely monitor and adjust regimen as needed.  Constipation due to medication Constipation likely from medication. Managed with medication balance, but bowel habits vary.  Chemotherapy treatment Ongoing chemotherapy  with concerns about medication disposal and side effects. Discussed 'chemo holiday' for travel if approved by oncology to allow patient to travel to the beach with her family. - Dispose of unused medication at infusion center.  Electrolyte imbalance Possible imbalance due to low water intake causing leg cramps. Encouraged to increase hydration. - Encourage increased water intake.  Use of water bottle to identify daily intake. - Suggest electrolyte water for leg cramps  Follow-up Follow-up in 3-4 weeks.  Sooner if needed.   Patient expressed understanding and was in agreement with this plan. She also understands that She can call the clinic at any time with any questions, concerns, or complaints.   Any controlled substances utilized were prescribed in the context of palliative care. PDMP has been reviewed.   Visit consisted of counseling and education dealing with the complex and emotionally intense issues of symptom management and palliative care in the setting of serious and potentially life-threatening illness.  Dellia Ferguson, AGPCNP-BC  Palliative Medicine Team/[facility]

## 2023-12-11 NOTE — Progress Notes (Signed)
 [facility] [phone]   Fax:([phone]) [phone]  PROGRESS NOTE  Patient Care Team: Olin Bertin, MD as PCP - General (Family Medicine) Pickenpack-Cousar, Giles Labrum, NP as Nurse Practitioner (Nurse Practitioner)  Hematological/Oncological History # Plasmacytoma of Thoracic Spine  # IgG Kappa Multiple myeloma 09/22/2022: Patient underwent MRI thoracic spine which showed acute or subacute T7, T10, and T11 compression fractures 10/10/2022: biopsy of compression fracture of T11 reveals a plasmacytoma 11/13/2022: establish care with Dr. Rosaline Coma  12/06/2022: bone marrow biopsy showed plasma cell myeloma involving 80% of the marrow.  12/24/2022: Cycle 1 Day 1 of Velcade /Dex 01/14/2023: Cycle 2 Day 1 of Velcade /Dex 02/04/2023: Cycle 3 Day 1 of Velcade /Dex 02/26/2023: Cycle 4 Day 1 of Velcade /Dex HELD due to ocular toxicity 03/04/2023: Cycle 1 Day 1 of Dara/Dex  04/01/2023: Cycle 2 Day 1 of Dara/Dex  04/29/2023: Cycle 3 Day 1 of Dara/Dex  05/27/2023: Cycle 4 Day 1 of Dara/Dex  06/24/2023: Cycle 5 Day 1 of Dara/Dex  07/23/2023: Cycle 6 Day 1 of Dara/Rev/Dex  08/20/2023: Cycle 7 Day 1 of Dara/Rev/Dex  09/18/2023 Cycle 8 Day 1 of Dara/Rev/Dex  10/16/2023: Cycle 9 Day 1 of Dara/Rev/Dex  11/13/2023: Cycle 10 Day 1 of Dara/Rev/Dex  12/11/2023: Cycle 11 Day 1 of Dara/Rev/Dex   Interval History:  Alice Anthony 83 y.o. female with medical history significant for newly diagnosed IgG kappa multiple myeloma who presents for a follow up visit. The patient's last visit was on 11/13/2023.  In the interim since the last visit she has continued on Dara/Rev/Dex therapy.   On exam today Alice Anthony is unaccompanied for this visit.  She can continues to tolerate the treatment without any new or concerning toxicities.  Her energy levels are stable.  She has a good appetite without any weight changes.  She denies nausea, vomiting or bowel habit changes she denies easy bruising or signs of active  bleeding.  She reports her pain is well-controlled with tramadol  that she takes twice daily.  She has no other concerns.  Overall she is willing and able to continue with chemotherapy at this time.  A full 10 point ROS is otherwise negative.    MEDICAL HISTORY:  Past Medical History:  Diagnosis Date   Anxiety    Arthritis    Asthma    Hx: of   Cancer (HCC)    Hx: of skin cancer   Diverticulosis    GERD (gastroesophageal reflux disease)    Hx: of   History of kidney stones    Hyperlipemia    Hypertension    Pre-diabetes     SURGICAL HISTORY: Past Surgical History:  Procedure Laterality Date   ABDOMINAL SURGERY  09/19/2012   Ruptured Diverticuli, Ostomy Placed.   BREAST BIOPSY  06/06/2012   breast biopsy   CATARACT EXTRACTION W/ INTRAOCULAR LENS IMPLANT Bilateral    COLONOSCOPY  03/06/2012   COLOSTOMY CLOSURE  12/25/2012   Dr Afton Horse   COLOSTOMY CLOSURE N/A 12/25/2012   Procedure: COLOSTOMY CLOSURE;  Surgeon: Brandy Cal. Cornett, MD;  Location: [facility] OR;  Service: General;  Laterality: N/A;   EXTRACORPOREAL SHOCK WAVE LITHOTRIPSY Left 11/17/2018   Procedure: EXTRACORPOREAL SHOCK WAVE LITHOTRIPSY (ESWL);  Surgeon: Marco Severs, MD;  Location: [facility] ORS;  Service: Urology;  Laterality: Left;   EYE SURGERY  10/19/1998   Hx: of cyst removed from left eye   KYPHOPLASTY N/A 10/10/2022   Procedure: KYPHOPLASTY Thoracic seven , Thoracic ten,Thoracic eleven;  Surgeon: Garry Kansas, MD;  Location: [facility]  OR;  Service: Neurosurgery;  Laterality: N/A;   LAPAROTOMY N/A 09/19/2012   Procedure: EXPLORATORY LAPAROTOMY, sigmoid colectomy;  Surgeon: Brandy Cal. Cornett, MD;  Location: [facility] ORS;  Service: General;  Laterality: N/A;   SIGMOIDOSCOPY  01/11/1999   TONSILLECTOMY     TRIGGER FINGER RELEASE  12/25/2010   TUBAL LIGATION  10/16/1985    SOCIAL HISTORY: Social History   Socioeconomic History   Marital status: Widowed    Spouse name: Not on file   Number of children: Not on file    Years of education: Not on file   Highest education level: Not on file  Occupational History   Not on file  Tobacco Use   Smoking status: Never   Smokeless tobacco: Never  Vaping Use   Vaping status: Never Used  Substance and Sexual Activity   Alcohol use: No   Drug use: No   Sexual activity: Not Currently  Other Topics Concern   Not on file  Social History Narrative   Not on file   Social Drivers of Health   Financial Resource Strain: Not on file  Food Insecurity: Not on file  Transportation Needs: Not on file  Physical Activity: Not on file  Stress: Not on file  Social Connections: Not on file  Intimate Partner Violence: Not on file    FAMILY HISTORY: Family History  Problem Relation Age of Onset   Diabetes Father    Alzheimer's disease Father    Basal cell carcinoma Mother        Metastatic    ALLERGIES:  is allergic to hydrocodone , vioxx [rofecoxib], codeine, doxycycline hyclate, latex, and revlimid  [lenalidomide ].  MEDICATIONS:  Current Outpatient Medications  Medication Sig Dispense Refill   acyclovir  (ZOVIRAX ) 400 MG tablet Take 1 tablet (400 mg total) by mouth 2 (two) times daily. 180 tablet 0   ALPRAZolam  (XANAX ) 0.25 MG tablet Take 0.25 mg by mouth 3 (three) times daily as needed.     amLODipine  (NORVASC ) 5 MG tablet Take 5 mg by mouth daily.     aspirin  81 MG tablet Take 81 mg by mouth daily.     cetirizine (ZYRTEC) 10 MG tablet Take 10 mg by mouth daily.     Cholecalciferol (VITAMIN D3) 2000 UNITS capsule Take 2,000 Units by mouth daily.     dexamethasone  (DECADRON ) 4 MG tablet Take 40 mg (10 tablets) by mouth once a week. 40 tablet 3   dextromethorphan-guaiFENesin  (TUSSIN DM) 10-100 MG/5ML liquid Take 10 mLs by mouth every 4 (four) hours as needed for cough.     fluticasone  (FLONASE ) 50 MCG/ACT nasal spray Place 2 sprays into the nose at bedtime.     hydrOXYzine  (ATARAX ) 25 MG tablet Take 25 mg by mouth at bedtime.     Insulin  Regular Human (NOVOLIN R  FLEXPEN IJ) Inject 10 Units as directed as needed. 10 units when blood sugar is 200 or above     lenalidomide  (REVLIMID ) 5 MG capsule Take 1 capsule (5 mg total) by mouth daily. [employer] Auth #  [id number]  Date Obtained 11/21/23 Take 1 capsule daily for 21 days then none for 7 days 21 capsule 0   montelukast  (SINGULAIR ) 10 MG tablet Take 10 mg by mouth daily.     Probiotic Product (ALIGN PO) Take by mouth daily.     simvastatin  (ZOCOR ) 20 MG tablet Take 20 mg by mouth every evening.     traMADol  (ULTRAM ) 50 MG tablet TAKE 1 TO 2 TABLETS BY MOUTH EVERY 4 HOURS  AS NEEDED 90 tablet 0   VEVYE 0.1 % SOLN Place 1 drop into both eyes in the morning and at bedtime.     No current facility-administered medications for this visit.    REVIEW OF SYSTEMS:   Constitutional: ( - ) fevers, ( - )  chills , ( - ) night sweats Eyes: ( - ) blurriness of vision, ( - ) double vision, ( - ) watery eyes Ears, nose, mouth, throat, and face: ( - ) mucositis, ( - ) sore throat Respiratory: ( - ) cough, ( - ) dyspnea, ( - ) wheezes Cardiovascular: ( - ) palpitation, ( - ) chest discomfort, ( - ) lower extremity swelling Gastrointestinal:  ( - ) nausea, ( - ) heartburn, ( - ) change in bowel habits Skin: ( - ) abnormal skin rashes Lymphatics: ( - ) new lymphadenopathy, ( - ) easy bruising Neurological: ( - ) numbness, ( - ) tingling, ( - ) new weaknesses Behavioral/Psych: ( - ) mood change, ( - ) new changes  All other systems were reviewed with the patient and are negative.  PHYSICAL EXAMINATION: ECOG PERFORMANCE STATUS: 1 - Symptomatic but completely ambulatory  Vitals:   12/11/23 1135  BP: (!) 148/77  Pulse: 77  Resp: 14  Temp: 97.7 F (36.5 C)  SpO2: 100%        Filed Weights   12/11/23 1135  Weight: 126 lb 1.6 oz (57.2 kg)     GENERAL: Well-appearing elderly Caucasian female, alert, no distress and comfortable SKIN: skin color, texture, turgor are normal, no rashes or significant lesions EYES:  conjunctiva are pink and non-injected, sclera clear.  LUNGS: clear to auscultation and percussion with normal breathing effort HEART: regular rate & rhythm and no murmurs and no lower extremity edema Musculoskeletal: no cyanosis of digits and no clubbing  PSYCH: alert & oriented x 3, fluent speech NEURO: no focal motor/sensory deficits  LABORATORY DATA:  I have reviewed the data as listed    Latest Ref Rng & Units 12/11/2023   11:06 AM 11/13/2023   12:41 PM 10/16/2023    2:36 PM  CBC  WBC 4.0 - 10.5 K/uL 4.5  5.9  2.9   Hemoglobin 12.0 - 15.0 g/dL 11.9  14.7  82.9   Hematocrit 36.0 - 46.0 % 43.2  42.6  42.4   Platelets 150 - 400 K/uL 188  190  195        Latest Ref Rng & Units 12/11/2023   11:06 AM 11/13/2023   12:41 PM 10/16/2023    2:36 PM  CMP  Glucose 70 - 99 mg/dL 562  130  865   BUN 8 - 23 mg/dL 12  14  15    Creatinine 0.44 - 1.00 mg/dL 7.84  6.96  2.95   Sodium 135 - 145 mmol/L 140  142  139   Potassium 3.5 - 5.1 mmol/L 3.7  3.5  3.7   Chloride 98 - 111 mmol/L 104  107  106   CO2 22 - 32 mmol/L 27  26  25    Calcium  8.9 - 10.3 mg/dL 9.1  9.3  8.7   Total Protein 6.5 - 8.1 g/dL 6.6  6.7  6.3   Total Bilirubin 0.0 - 1.2 mg/dL 0.6  0.5  0.6   Alkaline Phos 38 - 126 U/L 60  68  59   AST 15 - 41 U/L 20  15  17    ALT 0 - 44 U/L 17  12  12     Lab Results  Component Value Date   MPROTEIN 0.1 (H) 11/13/2023   MPROTEIN 0.1 (H) 10/16/2023   MPROTEIN 0.1 (H) 09/18/2023   Lab Results  Component Value Date   KPAFRELGTCHN 56.5 (H) 11/13/2023   KPAFRELGTCHN 62.6 (H) 10/16/2023   KPAFRELGTCHN 86.3 (H) 09/18/2023   LAMBDASER 3.2 (L) 11/13/2023   LAMBDASER 2.6 (L) 10/16/2023   LAMBDASER 2.6 (L) 09/18/2023   KAPLAMBRATIO 17.66 (H) 11/13/2023   KAPLAMBRATIO 24.08 (H) 10/16/2023   KAPLAMBRATIO 33.19 (H) 09/18/2023   Lab Results  Component Value Date   MPROTEIN 0.1 (H) 11/13/2023   MPROTEIN 0.1 (H) 10/16/2023   MPROTEIN 0.1 (H) 09/18/2023   Lab Results  Component Value Date    KPAFRELGTCHN 56.5 (H) 11/13/2023   KPAFRELGTCHN 62.6 (H) 10/16/2023   KPAFRELGTCHN 86.3 (H) 09/18/2023   LAMBDASER 3.2 (L) 11/13/2023   LAMBDASER 2.6 (L) 10/16/2023   LAMBDASER 2.6 (L) 09/18/2023   KAPLAMBRATIO 17.66 (H) 11/13/2023   KAPLAMBRATIO 24.08 (H) 10/16/2023   KAPLAMBRATIO 33.19 (H) 09/18/2023     RADIOGRAPHIC STUDIES: No results found.  ASSESSMENT & PLAN Alice Anthony is a 83 y.o.  female with medical history significant for IgG kappa multiple myeloma who presents for a follow up visit.   # IgG Kappa Multiple Myeloma # Plasmacytoma of Spine  -- Patient diagnosed with plasmacytoma of the spine, subsequent bone marrow biopsy confirmed an IgG kappa multiple myeloma involving the bone marrow --start of Velcade /Dex Cycle 1 Day 1 on 12/24/2022. --Due to ocular toxicity, recommended to discontinue Velcade  therapy and switch to Daratumumab .  --Started Revlimid  on 07/17/2023 at 10 mg PO daily. Dose reduced to 5 mg PO daily on 10/30/2023 due to rash.  Plan: --Due for Cycle 11, Day 1 of Dara/Rev/Dex --Tolerating lower dose of Revlimid  without any new toxicities.  --Labs today show WBC 4.5, hemoglobin 15.5, MCV 92.3, platelets 188, Creatinine and LFTs are normal.  --Last M protein 0.1 on 10/16/2023 with kappa free light chains decreased from 62.6 to 56.5 -- Return to clinic in 4 weeks with continued Dara/Dex.    #Ocular toxicity/Stye --Secondary to Velcade  therapy --Used Moxifloxacin  drops with improvement --Received azithromycin  x 5 days.   #Supportive Care -- chemotherapy education complete -- port placement not required   -- zofran  8mg  q8H PRN and compazine  10mg  PO q6H for nausea -- acyclovir  400mg  PO BID for VCZ prophylaxis -- Recieved dental clearance prior to the start of Xgeva/Zometa . First dose on 01/07/2023. Last dose on 10/16/2023. Next dose in June 2025  -- Tramadol  50 mg every 6 hours as needed for pain control.  Currently taking 2 daily.  No orders of the defined  types were placed in this encounter.  All questions were answered. The patient knows to call the clinic with any problems, questions or concerns.  I have spent a total of 30 minutes minutes of face-to-face and non-face-to-face time, preparing to see the patient, performing a medically appropriate examination, counseling and educating the patient,  documenting clinical information in the electronic health record,and care coordination.   Wyline Hearing PA-C Dept of Hematology and Oncology [facility] Phone: [phone]    12/11/2023 12:31 PM

## 2023-12-11 NOTE — Progress Notes (Signed)
 Patient took all of her premeds at home today at 1200 noon. 650mg  Po tylenol , 25mg  Po benadryl , 10mg  singulair  and 20mg  of dexamethasone .

## 2023-12-12 LAB — KAPPA/LAMBDA LIGHT CHAINS
Kappa free light chain: 48.4 mg/L — ABNORMAL HIGH (ref 3.3–19.4)
Kappa, lambda light chain ratio: 14.67 — ABNORMAL HIGH (ref 0.26–1.65)
Lambda free light chains: 3.3 mg/L — ABNORMAL LOW (ref 5.7–26.3)

## 2023-12-15 LAB — MULTIPLE MYELOMA PANEL, SERUM
Albumin SerPl Elph-Mcnc: 4.1 g/dL (ref 2.9–4.4)
Albumin/Glob SerPl: 1.9 — ABNORMAL HIGH (ref 0.7–1.7)
Alpha 1: 0.3 g/dL (ref 0.0–0.4)
Alpha2 Glob SerPl Elph-Mcnc: 0.8 g/dL (ref 0.4–1.0)
B-Globulin SerPl Elph-Mcnc: 0.9 g/dL (ref 0.7–1.3)
Gamma Glob SerPl Elph-Mcnc: 0.3 g/dL — ABNORMAL LOW (ref 0.4–1.8)
Globulin, Total: 2.2 g/dL (ref 2.2–3.9)
IgA: 17 mg/dL — ABNORMAL LOW (ref 64–422)
IgG (Immunoglobin G), Serum: 372 mg/dL — ABNORMAL LOW (ref 586–1602)
IgM (Immunoglobulin M), Srm: 25 mg/dL — ABNORMAL LOW (ref 26–217)
Total Protein ELP: 6.3 g/dL (ref 6.0–8.5)

## 2023-12-20 ENCOUNTER — Other Ambulatory Visit: Payer: Self-pay | Admitting: Hematology and Oncology

## 2024-01-08 ENCOUNTER — Inpatient Hospital Stay (HOSPITAL_BASED_OUTPATIENT_CLINIC_OR_DEPARTMENT_OTHER): Admitting: Hematology and Oncology

## 2024-01-08 ENCOUNTER — Inpatient Hospital Stay: Attending: Hematology and Oncology

## 2024-01-08 ENCOUNTER — Encounter: Payer: Self-pay | Admitting: Nurse Practitioner

## 2024-01-08 ENCOUNTER — Inpatient Hospital Stay

## 2024-01-08 ENCOUNTER — Inpatient Hospital Stay (HOSPITAL_BASED_OUTPATIENT_CLINIC_OR_DEPARTMENT_OTHER): Admitting: Nurse Practitioner

## 2024-01-08 VITALS — BP 137/70 | HR 67 | Temp 97.7°F | Resp 18 | Wt 127.4 lb

## 2024-01-08 DIAGNOSIS — Z5112 Encounter for antineoplastic immunotherapy: Secondary | ICD-10-CM | POA: Insufficient documentation

## 2024-01-08 DIAGNOSIS — R530 Neoplastic (malignant) related fatigue: Secondary | ICD-10-CM

## 2024-01-08 DIAGNOSIS — Z79899 Other long term (current) drug therapy: Secondary | ICD-10-CM | POA: Diagnosis not present

## 2024-01-08 DIAGNOSIS — Z515 Encounter for palliative care: Secondary | ICD-10-CM

## 2024-01-08 DIAGNOSIS — C9000 Multiple myeloma not having achieved remission: Secondary | ICD-10-CM | POA: Diagnosis not present

## 2024-01-08 DIAGNOSIS — G893 Neoplasm related pain (acute) (chronic): Secondary | ICD-10-CM | POA: Diagnosis not present

## 2024-01-08 LAB — CMP (CANCER CENTER ONLY)
ALT: 18 U/L (ref 0–44)
AST: 19 U/L (ref 15–41)
Albumin: 4.6 g/dL (ref 3.5–5.0)
Alkaline Phosphatase: 54 U/L (ref 38–126)
Anion gap: 8 (ref 5–15)
BUN: 18 mg/dL (ref 8–23)
CO2: 25 mmol/L (ref 22–32)
Calcium: 9.3 mg/dL (ref 8.9–10.3)
Chloride: 106 mmol/L (ref 98–111)
Creatinine: 1.34 mg/dL — ABNORMAL HIGH (ref 0.44–1.00)
GFR, Estimated: 40 mL/min — ABNORMAL LOW (ref >60)
Glucose, Bld: 240 mg/dL — ABNORMAL HIGH (ref 70–99)
Potassium: 4.3 mmol/L (ref 3.5–5.1)
Sodium: 139 mmol/L (ref 135–145)
Total Bilirubin: 0.6 mg/dL (ref 0.0–1.2)
Total Protein: 6.7 g/dL (ref 6.5–8.1)

## 2024-01-08 LAB — CBC WITH DIFFERENTIAL (CANCER CENTER ONLY)
Abs Immature Granulocytes: 0.01 10*3/uL (ref 0.00–0.07)
Basophils Absolute: 0 10*3/uL (ref 0.0–0.1)
Basophils Relative: 1 %
Eosinophils Absolute: 0 10*3/uL (ref 0.0–0.5)
Eosinophils Relative: 1 %
HCT: 42.9 % (ref 36.0–46.0)
Hemoglobin: 15.4 g/dL — ABNORMAL HIGH (ref 12.0–15.0)
Immature Granulocytes: 0 %
Lymphocytes Relative: 7 %
Lymphs Abs: 0.4 10*3/uL — ABNORMAL LOW (ref 0.7–4.0)
MCH: 33.5 pg (ref 26.0–34.0)
MCHC: 35.9 g/dL (ref 30.0–36.0)
MCV: 93.3 fL (ref 80.0–100.0)
Monocytes Absolute: 0 10*3/uL — ABNORMAL LOW (ref 0.1–1.0)
Monocytes Relative: 1 %
Neutro Abs: 5 10*3/uL (ref 1.7–7.7)
Neutrophils Relative %: 90 %
Platelet Count: 182 10*3/uL (ref 150–400)
RBC: 4.6 MIL/uL (ref 3.87–5.11)
RDW: 12.9 % (ref 11.5–15.5)
WBC Count: 5.5 10*3/uL (ref 4.0–10.5)
nRBC: 0 % (ref 0.0–0.2)

## 2024-01-08 MED ORDER — MONTELUKAST SODIUM 10 MG PO TABS: 10.0000 mg | ORAL_TABLET | Freq: Once | ORAL | Status: DC

## 2024-01-08 MED ORDER — ACETAMINOPHEN 325 MG PO TABS: 650.0000 mg | ORAL_TABLET | Freq: Once | ORAL | Status: DC

## 2024-01-08 MED ORDER — SODIUM CHLORIDE 0.9 % IV SOLN: INTRAVENOUS | Status: DC

## 2024-01-08 MED ORDER — DARATUMUMAB-HYALURONIDASE-FIHJ 1800-30000 MG-UT/15ML ~~LOC~~ SOLN
1800.0000 mg | Freq: Once | SUBCUTANEOUS | Status: AC
  Administered 2024-01-08: 1800 mg via SUBCUTANEOUS
  Filled 2024-01-08: qty 15

## 2024-01-08 MED ORDER — DIPHENHYDRAMINE HCL 25 MG PO CAPS
25.0000 mg | ORAL_CAPSULE | Freq: Once | ORAL | Status: DC
Start: 2024-01-08 — End: 2024-01-08

## 2024-01-08 MED ORDER — ZOLEDRONIC ACID 4 MG/5ML IV CONC
3.3000 mg | Freq: Once | INTRAVENOUS | Status: AC
  Administered 2024-01-08: 3.3 mg via INTRAVENOUS
  Filled 2024-01-08: qty 4.13

## 2024-01-08 NOTE — Progress Notes (Signed)
 Palliative Medicine [facility]  Telephone:(336) [phone] Fax:(336) [phone]   Name: Alice Anthony Date: 01/08/2024 MRN: [id number]  DOB: Oct 07, 1940  Patient Care Team: Olin Bertin, MD as PCP - General (Family Medicine) Pickenpack-Cousar, Giles Labrum, NP as Nurse Practitioner (Nurse Practitioner)   INTERVAL HISTORY: Alice Anthony is a 83 y.o. female with oncologic medical history including recent diagnosis of multiple myeloma (12/2022), DM, and posterior vitreous detachment of left and right eyes. Palliative ask to see for symptom management and goals of care   SOCIAL HISTORY:     reports that she has never smoked. She has never used smokeless tobacco. She reports that she does not drink alcohol and does not use drugs.  ADVANCE DIRECTIVES:  Advanced directives on file  CODE STATUS: Full code  PAST MEDICAL HISTORY: Past Medical History:  Diagnosis Date   Anxiety    Arthritis    Asthma    Hx: of   Cancer (HCC)    Hx: of skin cancer   Diverticulosis    GERD (gastroesophageal reflux disease)    Hx: of   History of kidney stones    Hyperlipemia    Hypertension    Pre-diabetes     ALLERGIES:  is allergic to hydrocodone , vioxx [rofecoxib], codeine, doxycycline hyclate, latex, and revlimid  [lenalidomide ].  MEDICATIONS:  Current Outpatient Medications  Medication Sig Dispense Refill   acyclovir  (ZOVIRAX ) 400 MG tablet Take 1 tablet (400 mg total) by mouth 2 (two) times daily. 180 tablet 0   ALPRAZolam  (XANAX ) 0.25 MG tablet Take 0.25 mg by mouth 3 (three) times daily as needed.     amLODipine  (NORVASC ) 5 MG tablet Take 5 mg by mouth daily.     aspirin  81 MG tablet Take 81 mg by mouth daily.     cetirizine (ZYRTEC) 10 MG tablet Take 10 mg by mouth daily.     Cholecalciferol (VITAMIN D3) 2000 UNITS capsule Take 2,000 Units by mouth daily.     dexamethasone  (DECADRON ) 4 MG tablet Take 40 mg (10 tablets) by mouth once a week. 40 tablet 3    dextromethorphan-guaiFENesin  (TUSSIN DM) 10-100 MG/5ML liquid Take 10 mLs by mouth every 4 (four) hours as needed for cough.     fluticasone  (FLONASE ) 50 MCG/ACT nasal spray Place 2 sprays into the nose at bedtime.     hydrOXYzine  (ATARAX ) 25 MG tablet Take 25 mg by mouth at bedtime.     Insulin  Regular Human (NOVOLIN R FLEXPEN IJ) Inject 10 Units as directed as needed. 10 units when blood sugar is 200 or above     lenalidomide  (REVLIMID ) 5 MG capsule TAKE 1 CAPSULE BY MOUTH 1 TIME A DAY FOR 21 DAYS ON THEN 7 DAYS OFF 21 capsule 0   montelukast  (SINGULAIR ) 10 MG tablet Take 10 mg by mouth daily.     Probiotic Product (ALIGN PO) Take by mouth daily.     simvastatin  (ZOCOR ) 20 MG tablet Take 20 mg by mouth every evening.     traMADol  (ULTRAM ) 50 MG tablet TAKE 1 TO 2 TABLETS BY MOUTH EVERY 4 HOURS AS NEEDED 90 tablet 0   VEVYE 0.1 % SOLN Place 1 drop into both eyes in the morning and at bedtime.     No current facility-administered medications for this visit.    VITAL SIGNS: There were no vitals taken for this visit. There were no vitals filed for this visit.  Estimated body mass index is 24.08 kg/m as calculated from the  following:   Height as of 11/13/23: 5\' 1"  (1.549 m).   Weight as of an earlier encounter on 01/08/24: 127 lb 7 oz (57.8 kg).   PERFORMANCE STATUS (ECOG) : 2 - Symptomatic, <50% confined to bed  Discussed the use of AI scribe software for clinical note transcription with the patient, who gave verbal consent to proceed.  History of Present Illness Alice Anthony "[name]" is an 83 year old female who was seen during her infusion for follow-up. She is accompanied by her daughter. No acute distress.  Occasional fatigue however despite this she continues to remain active doing things around her home. Denies concerns for nausea, vomiting, constipation, or diarrhea.   Ms. Regas takes two tramadol  once a day for pain management . Pain is well managed. Does not require around the  clock use of Tramadol . No adjustments to regiment.   All symptoms well managed. All questions answered and support provided.  Goals of Care 5/20- We discussed her current illness and what it means in the larger context of her on-going co-morbidities. Natural disease trajectory and expectations were discussed.   Patient and daughter are realistic in their understanding of current illness. Is remaining hopeful for stability and improve quality of life.   We discussed Her current illness and what it means in the larger context of Her on-going co-morbidities. Natural disease trajectory and expectations were discussed.  I discussed the importance of continued conversation with family and their medical providers regarding overall plan of care and treatment options, ensuring decisions are within the context of the patients values and GOCs. Assessment & Plan Pain management with Tramadol  and Tylenol  Chronic pain managed with as needed tramadol .  Generally requiring once a day.  Discussed safe concurrent use and dosing flexibility. - Educated on combined use of Tylenol  Arthritis and Tramadol . - Advised Tramadol  every 4 hours PRN for severe pain. - Encouraged trial of Tylenol  Arthritis. - We will continue to closely monitor and adjust regimen as needed.  Constipation due to medication Constipation likely from medication. Managed with medication balance, but bowel habits vary.  Follow-up Follow-up in 3-4 weeks.  Sooner if needed.    Patient expressed understanding and was in agreement with this plan. She also understands that She can call the clinic at any time with any questions, concerns, or complaints.   Any controlled substances utilized were prescribed in the context of palliative care. PDMP has been reviewed.   Visit consisted of counseling and education dealing with the complex and emotionally intense issues of symptom management and palliative care in the setting of serious and  potentially life-threatening illness.  Dellia Ferguson, AGPCNP-BC  Palliative Medicine Team/[facility]

## 2024-01-08 NOTE — Progress Notes (Signed)
 Patient states she took dexamethasone  20 mg at 0840 this morning, Took tylenole 650 mg, benadryl  25 mg & singulair  10 mg at 1415 today.

## 2024-01-08 NOTE — Progress Notes (Signed)
 [facility] [phone]   [phone]  PROGRESS NOTE  Patient Care Team: Olin Bertin, MD as PCP - General (Family Medicine) Pickenpack-Cousar, Giles Labrum, NP as Nurse Practitioner (Nurse Practitioner)  Hematological/Oncological History # Plasmacytoma of Thoracic Spine  # IgG Kappa Multiple myeloma 09/22/2022: Patient underwent MRI thoracic spine which showed acute or subacute T7, T10, and T11 compression fractures 10/10/2022: biopsy of compression fracture of T11 reveals a plasmacytoma 11/13/2022: establish care with Dr. Rosaline Coma  12/06/2022: bone marrow biopsy showed plasma cell myeloma involving 80% of the marrow.  12/24/2022: Cycle 1 Day 1 of Velcade /Dex 01/14/2023: Cycle 2 Day 1 of Velcade /Dex 02/04/2023: Cycle 3 Day 1 of Velcade /Dex 02/26/2023: Cycle 4 Day 1 of Velcade /Dex HELD due to ocular toxicity 03/04/2023: Cycle 1 Day 1 of Dara/Dex  04/01/2023: Cycle 2 Day 1 of Dara/Dex  04/29/2023: Cycle 3 Day 1 of Dara/Dex  05/27/2023: Cycle 4 Day 1 of Dara/Dex  06/24/2023: Cycle 5 Day 1 of Dara/Dex  07/23/2023: Cycle 6 Day 1 of Dara/Rev/Dex  08/20/2023: Cycle 7 Day 1 of Dara/Rev/Dex  09/18/2023 Cycle 8 Day 1 of Dara/Rev/Dex  10/16/2023: Cycle 9 Day 1 of Dara/Rev/Dex  11/13/2023: Cycle 10 Day 1 of Dara/Rev/Dex  12/11/2023: Cycle 11 Day 1 of Dara/Rev/Dex   Interval History:  Dewain Foot 83 y.o. female with medical history significant for newly diagnosed IgG kappa multiple myeloma who presents for a follow up visit. The patient's last visit was on 12/11/2023.  In the interim since the last visit she has continued on Dara/Rev/Dex therapy.   On exam today Ms. Reder reports that she has been eating well and her energy levels are good.  She reports does continue to have some pain and is currently taking 2 tramadol  pills per day.  She notes she is also taking some Tylenol  as well.  Otherwise she is not having any major side effects as a result of her treatment.  Her  Darzalex  shots are not causing any trouble such as redness, itching, or rash.  She is tolerating her Revlimid  without difficulty.  Overall she is willing and able to continue on treatment at this time.  She denies any fevers, chills, sweats, nausea, vomiting or diarrhea.  A full 10 point ROS is otherwise negative.   MEDICAL HISTORY:  Past Medical History:  Diagnosis Date   Anxiety    Arthritis    Asthma    Hx: of   Cancer (HCC)    Hx: of skin cancer   Diverticulosis    GERD (gastroesophageal reflux disease)    Hx: of   History of kidney stones    Hyperlipemia    Hypertension    Pre-diabetes     SURGICAL HISTORY: Past Surgical History:  Procedure Laterality Date   ABDOMINAL SURGERY  09/19/2012   Ruptured Diverticuli, Ostomy Placed.   BREAST BIOPSY  06/06/2012   breast biopsy   CATARACT EXTRACTION W/ INTRAOCULAR LENS IMPLANT Bilateral    COLONOSCOPY  03/06/2012   COLOSTOMY CLOSURE  12/25/2012   Dr Afton Horse   COLOSTOMY CLOSURE N/A 12/25/2012   Procedure: COLOSTOMY CLOSURE;  Surgeon: Brandy Cal. Cornett, MD;  Location: [facility] OR;  Service: General;  Laterality: N/A;   EXTRACORPOREAL SHOCK WAVE LITHOTRIPSY Left 11/17/2018   Procedure: EXTRACORPOREAL SHOCK WAVE LITHOTRIPSY (ESWL);  Surgeon: Marco Severs, MD;  Location: [facility] ORS;  Service: Urology;  Laterality: Left;   EYE SURGERY  10/19/1998   Hx: of cyst removed from left eye   KYPHOPLASTY N/A 10/10/2022  Procedure: KYPHOPLASTY Thoracic seven , Thoracic ten,Thoracic eleven;  Surgeon: Garry Kansas, MD;  Location: [facility] OR;  Service: Neurosurgery;  Laterality: N/A;   LAPAROTOMY N/A 09/19/2012   Procedure: EXPLORATORY LAPAROTOMY, sigmoid colectomy;  Surgeon: Brandy Cal. Cornett, MD;  Location: [facility] OR[facility];  Service: General;  Laterality: N/A;   SIGMOIDOSCOPY  01/11/1999   TONSILLECTOMY     TRIGGER FINGER RELEASE  12/25/2010   TUBAL LIGATION  10/16/1985    SOCIAL HISTORY: Social History   Socioeconomic History   Marital status:  Widowed    Spouse name: Not on file   Number of children: Not on file   Years of education: Not on file   Highest education level: Not on file  Occupational History   Not on file  Tobacco Use   Smoking status: Never   Smokeless tobacco: Never  Vaping Use   Vaping status: Never Used  Substance and Sexual Activity   Alcohol use: No   Drug use: No   Sexual activity: Not Currently  Other Topics Concern   Not on file  Social History Narrative   Not on file   Social Drivers of Health   Financial Resource Strain: Not on file  Food Insecurity: Not on file  Transportation Needs: Not on file  Physical Activity: Not on file  Stress: Not on file  Social Connections: Not on file  Intimate Partner Violence: Not on file    FAMILY HISTORY: Family History  Problem Relation Age of Onset   Diabetes Father    Alzheimer's disease Father    Basal cell carcinoma Mother        Metastatic    ALLERGIES:  is allergic to hydrocodone , vioxx [rofecoxib], codeine, doxycycline hyclate, latex, and revlimid  [lenalidomide ].  MEDICATIONS:  Current Outpatient Medications  Medication Sig Dispense Refill   acyclovir  (ZOVIRAX ) 400 MG tablet Take 1 tablet (400 mg total) by mouth 2 (two) times daily. 180 tablet 0   ALPRAZolam  (XANAX ) 0.25 MG tablet Take 0.25 mg by mouth 3 (three) times daily as needed.     amLODipine  (NORVASC ) 5 MG tablet Take 5 mg by mouth daily.     aspirin  81 MG tablet Take 81 mg by mouth daily.     cetirizine (ZYRTEC) 10 MG tablet Take 10 mg by mouth daily.     Cholecalciferol (VITAMIN D3) 2000 UNITS capsule Take 2,000 Units by mouth daily.     dexamethasone  (DECADRON ) 4 MG tablet Take 40 mg (10 tablets) by mouth once a week. 40 tablet 3   dextromethorphan-guaiFENesin  (TUSSIN DM) 10-100 MG/5ML liquid Take 10 mLs by mouth every 4 (four) hours as needed for cough.     fluticasone  (FLONASE ) 50 MCG/ACT nasal spray Place 2 sprays into the nose at bedtime.     hydrOXYzine  (ATARAX ) 25 MG  tablet Take 25 mg by mouth at bedtime.     Insulin  Regular Human (NOVOLIN R FLEXPEN IJ) Inject 10 Units as directed as needed. 10 units when blood sugar is 200 or above     lenalidomide  (REVLIMID ) 5 MG capsule TAKE 1 CAPSULE BY MOUTH 1 TIME A DAY FOR 21 DAYS ON THEN 7 DAYS OFF 21 capsule 0   montelukast  (SINGULAIR ) 10 MG tablet Take 10 mg by mouth daily.     Probiotic Product (ALIGN PO) Take by mouth daily.     simvastatin  (ZOCOR ) 20 MG tablet Take 20 mg by mouth every evening.     traMADol  (ULTRAM ) 50 MG tablet TAKE 1 TO 2 TABLETS BY MOUTH  EVERY 4 HOURS AS NEEDED 90 tablet 0   VEVYE 0.1 % SOLN Place 1 drop into both eyes in the morning and at bedtime.     No current facility-administered medications for this visit.    REVIEW OF SYSTEMS:   Constitutional: ( - ) fevers, ( - )  chills , ( - ) night sweats Eyes: ( - ) blurriness of vision, ( - ) double vision, ( - ) watery eyes Ears, nose, mouth, throat, and face: ( - ) mucositis, ( - ) sore throat Respiratory: ( - ) cough, ( - ) dyspnea, ( - ) wheezes Cardiovascular: ( - ) palpitation, ( - ) chest discomfort, ( - ) lower extremity swelling Gastrointestinal:  ( - ) nausea, ( - ) heartburn, ( - ) change in bowel habits Skin: ( - ) abnormal skin rashes Lymphatics: ( - ) new lymphadenopathy, ( - ) easy bruising Neurological: ( - ) numbness, ( - ) tingling, ( - ) new weaknesses Behavioral/Psych: ( - ) mood change, ( - ) new changes  All other systems were reviewed with the patient and are negative.  PHYSICAL EXAMINATION: ECOG PERFORMANCE STATUS: 1 - Symptomatic but completely ambulatory  There were no vitals filed for this visit.       There were no vitals filed for this visit.    GENERAL: Well-appearing elderly Caucasian female, alert, no distress and comfortable SKIN: skin color, texture, turgor are normal, no rashes or significant lesions EYES: conjunctiva are pink and non-injected, sclera clear.  LUNGS: clear to auscultation  and percussion with normal breathing effort HEART: regular rate & rhythm and no murmurs and no lower extremity edema Musculoskeletal: no cyanosis of digits and no clubbing  PSYCH: alert & oriented x 3, fluent speech NEURO: no focal motor/sensory deficits  LABORATORY DATA:  I have reviewed the data as listed    Latest Ref Rng & Units 01/08/2024    2:23 PM 12/11/2023   11:06 AM 11/13/2023   12:41 PM  CBC  WBC 4.0 - 10.5 K/uL 5.5  4.5  5.9   Hemoglobin 12.0 - 15.0 g/dL 40.9  81.1  91.4   Hematocrit 36.0 - 46.0 % 42.9  43.2  42.6   Platelets 150 - 400 K/uL 182  188  190        Latest Ref Rng & Units 01/08/2024    2:23 PM 12/11/2023   11:06 AM 11/13/2023   12:41 PM  CMP  Glucose 70 - 99 mg/dL 782  956  213   BUN 8 - 23 mg/dL 18  12  14    Creatinine 0.44 - 1.00 mg/dL 0.86  5.78  4.69   Sodium 135 - 145 mmol/L 139  140  142   Potassium 3.5 - 5.1 mmol/L 4.3  3.7  3.5   Chloride 98 - 111 mmol/L 106  104  107   CO2 22 - 32 mmol/L 25  27  26    Calcium  8.9 - 10.3 mg/dL 9.3  9.1  9.3   Total Protein 6.5 - 8.1 g/dL 6.7  6.6  6.7   Total Bilirubin 0.0 - 1.2 mg/dL 0.6  0.6  0.5   Alkaline Phos 38 - 126 U/L 54  60  68   AST 15 - 41 U/L 19  20  15    ALT 0 - 44 U/L 18  17  12      Lab Results  Component Value Date   MPROTEIN 0.1 (H) 01/08/2024   MPROTEIN  Not Observed 12/11/2023   MPROTEIN 0.1 (H) 11/13/2023   Lab Results  Component Value Date   KPAFRELGTCHN 45.0 (H) 01/08/2024   KPAFRELGTCHN 48.4 (H) 12/11/2023   KPAFRELGTCHN 56.5 (H) 11/13/2023   LAMBDASER 2.9 (L) 01/08/2024   LAMBDASER 3.3 (L) 12/11/2023   LAMBDASER 3.2 (L) 11/13/2023   KAPLAMBRATIO 15.52 (H) 01/08/2024   KAPLAMBRATIO 14.67 (H) 12/11/2023   KAPLAMBRATIO 17.66 (H) 11/13/2023   Lab Results  Component Value Date   MPROTEIN 0.1 (H) 01/08/2024   MPROTEIN Not Observed 12/11/2023   MPROTEIN 0.1 (H) 11/13/2023   Lab Results  Component Value Date   KPAFRELGTCHN 45.0 (H) 01/08/2024   KPAFRELGTCHN 48.4 (H) 12/11/2023    KPAFRELGTCHN 56.5 (H) 11/13/2023   LAMBDASER 2.9 (L) 01/08/2024   LAMBDASER 3.3 (L) 12/11/2023   LAMBDASER 3.2 (L) 11/13/2023   KAPLAMBRATIO 15.52 (H) 01/08/2024   KAPLAMBRATIO 14.67 (H) 12/11/2023   KAPLAMBRATIO 17.66 (H) 11/13/2023     RADIOGRAPHIC STUDIES: No results found.  ASSESSMENT & PLAN AMARRI MICHAELSON is a 83 y.o.  female with medical history significant for IgG kappa multiple myeloma who presents for a follow up visit.   # IgG Kappa Multiple Myeloma # Plasmacytoma of Spine  -- Patient diagnosed with plasmacytoma of the spine, subsequent bone marrow biopsy confirmed an IgG kappa multiple myeloma involving the bone marrow --start of Velcade /Dex Cycle 1 Day 1 on 12/24/2022. --Due to ocular toxicity, recommended to discontinue Velcade  therapy and switch to Daratumumab .  --Started Revlimid  on 07/17/2023 at 10 mg PO daily. Dose reduced to 5 mg PO daily on 10/30/2023 due to rash.  Plan: --Due for Cycle 12, Day 1 of Dara/Rev/Dex --Tolerating lower dose of Revlimid  without any new toxicities. (5 mg PO daily x 21 days with 7 days off) --Labs today show WBC 5.5, Hgb 15.4, MCV 93.3, Plt 182.  Creatinine and LFTs are normal.  --Last M protein undetectable on 12/11/2023 with kappa free light chains decreased to 45.0 -- Return to clinic in 4 weeks with continued Dara/Dex.    #Ocular toxicity/Stye --Secondary to Velcade  therapy --Used Moxifloxacin  drops with improvement  #Supportive Care -- chemotherapy education complete -- port placement not required   -- zofran  8mg  q8H PRN and compazine  10mg  PO q6H for nausea -- acyclovir  400mg  PO BID for VCZ prophylaxis -- Recieved dental clearance prior to the start of Xgeva/Zometa . First dose on 01/07/2023. Last dose on 10/16/2023. Next dose in September 2025 -- Tramadol  50 mg every 6 hours as needed for pain control.  Currently taking 2 daily.  Orders Placed This Encounter  Procedures   Multiple Myeloma Panel (SPEP&IFE w/QIG)    Standing Status:    Future    Expected Date:   03/04/2024    Expiration Date:   03/04/2025   Kappa/lambda light chains    Standing Status:   Future    Expected Date:   03/04/2024    Expiration Date:   03/04/2025   CMP (Cancer Center only)    Standing Status:   Future    Expected Date:   03/04/2024    Expiration Date:   03/04/2025   CBC with Differential (Cancer Center Only)    Standing Status:   Future    Expected Date:   03/04/2024    Expiration Date:   03/04/2025   Multiple Myeloma Panel (SPEP&IFE w/QIG)    Standing Status:   Future    Expected Date:   04/01/2024    Expiration Date:   04/01/2025   Kappa/lambda  light chains    Standing Status:   Future    Expected Date:   04/01/2024    Expiration Date:   04/01/2025   CMP (Cancer Center only)    Standing Status:   Future    Expected Date:   04/01/2024    Expiration Date:   04/01/2025   CBC with Differential (Cancer Center Only)    Standing Status:   Future    Expected Date:   04/01/2024    Expiration Date:   04/01/2025   All questions were answered. The patient knows to call the clinic with any problems, questions or concerns.  I have spent a total of 30 minutes minutes of face-to-face and non-face-to-face time, preparing to see the patient, performing a medically appropriate examination, counseling and educating the patient,  documenting clinical information in the electronic health record,and care coordination.   Rogerio Clay, MD Department of Hematology/Oncology [facility] Phone: [phone] Pager: [phone] Email: [email or web address]   01/12/2024 1:26 PM

## 2024-01-09 LAB — KAPPA/LAMBDA LIGHT CHAINS
Kappa free light chain: 45 mg/L — ABNORMAL HIGH (ref 3.3–19.4)
Kappa, lambda light chain ratio: 15.52 — ABNORMAL HIGH (ref 0.26–1.65)
Lambda free light chains: 2.9 mg/L — ABNORMAL LOW (ref 5.7–26.3)

## 2024-01-10 LAB — MULTIPLE MYELOMA PANEL, SERUM
Albumin SerPl Elph-Mcnc: 3.8 g/dL (ref 2.9–4.4)
Albumin/Glob SerPl: 1.7 (ref 0.7–1.7)
Alpha 1: 0.3 g/dL (ref 0.0–0.4)
Alpha2 Glob SerPl Elph-Mcnc: 0.8 g/dL (ref 0.4–1.0)
B-Globulin SerPl Elph-Mcnc: 0.9 g/dL (ref 0.7–1.3)
Gamma Glob SerPl Elph-Mcnc: 0.3 g/dL — ABNORMAL LOW (ref 0.4–1.8)
Globulin, Total: 2.3 g/dL (ref 2.2–3.9)
IgA: 17 mg/dL — ABNORMAL LOW (ref 64–422)
IgG (Immunoglobin G), Serum: 354 mg/dL — ABNORMAL LOW (ref 586–1602)
IgM (Immunoglobulin M), Srm: 20 mg/dL — ABNORMAL LOW (ref 26–217)
M Protein SerPl Elph-Mcnc: 0.1 g/dL — ABNORMAL HIGH
Total Protein ELP: 6.1 g/dL (ref 6.0–8.5)

## 2024-01-12 ENCOUNTER — Encounter: Payer: Self-pay | Admitting: Hematology and Oncology

## 2024-01-14 DIAGNOSIS — H04123 Dry eye syndrome of bilateral lacrimal glands: Secondary | ICD-10-CM | POA: Diagnosis not present

## 2024-01-16 ENCOUNTER — Other Ambulatory Visit: Payer: Self-pay | Admitting: Hematology and Oncology

## 2024-01-18 ENCOUNTER — Other Ambulatory Visit: Payer: Self-pay | Admitting: Nurse Practitioner

## 2024-01-18 DIAGNOSIS — C9000 Multiple myeloma not having achieved remission: Secondary | ICD-10-CM

## 2024-01-18 DIAGNOSIS — Z515 Encounter for palliative care: Secondary | ICD-10-CM

## 2024-01-18 DIAGNOSIS — G893 Neoplasm related pain (acute) (chronic): Secondary | ICD-10-CM

## 2024-01-20 ENCOUNTER — Encounter: Payer: Self-pay | Admitting: Hematology and Oncology

## 2024-01-22 DIAGNOSIS — D3131 Benign neoplasm of right choroid: Secondary | ICD-10-CM | POA: Diagnosis not present

## 2024-02-05 ENCOUNTER — Encounter: Payer: Self-pay | Admitting: Nurse Practitioner

## 2024-02-05 ENCOUNTER — Inpatient Hospital Stay (HOSPITAL_BASED_OUTPATIENT_CLINIC_OR_DEPARTMENT_OTHER): Admitting: Nurse Practitioner

## 2024-02-05 ENCOUNTER — Inpatient Hospital Stay (HOSPITAL_BASED_OUTPATIENT_CLINIC_OR_DEPARTMENT_OTHER): Admitting: Hematology and Oncology

## 2024-02-05 ENCOUNTER — Inpatient Hospital Stay

## 2024-02-05 ENCOUNTER — Inpatient Hospital Stay: Attending: Hematology and Oncology

## 2024-02-05 VITALS — BP 139/73 | HR 77 | Temp 97.5°F | Resp 14 | Wt 127.2 lb

## 2024-02-05 DIAGNOSIS — Z515 Encounter for palliative care: Secondary | ICD-10-CM | POA: Diagnosis not present

## 2024-02-05 DIAGNOSIS — C9000 Multiple myeloma not having achieved remission: Secondary | ICD-10-CM

## 2024-02-05 DIAGNOSIS — Z79899 Other long term (current) drug therapy: Secondary | ICD-10-CM | POA: Diagnosis not present

## 2024-02-05 DIAGNOSIS — Z5112 Encounter for antineoplastic immunotherapy: Secondary | ICD-10-CM | POA: Insufficient documentation

## 2024-02-05 DIAGNOSIS — G893 Neoplasm related pain (acute) (chronic): Secondary | ICD-10-CM | POA: Diagnosis not present

## 2024-02-05 DIAGNOSIS — R530 Neoplastic (malignant) related fatigue: Secondary | ICD-10-CM | POA: Diagnosis not present

## 2024-02-05 LAB — CMP (CANCER CENTER ONLY)
ALT: 18 U/L (ref 0–44)
AST: 17 U/L (ref 15–41)
Albumin: 4.3 g/dL (ref 3.5–5.0)
Alkaline Phosphatase: 52 U/L (ref 38–126)
Anion gap: 6 (ref 5–15)
BUN: 13 mg/dL (ref 8–23)
CO2: 29 mmol/L (ref 22–32)
Calcium: 9.3 mg/dL (ref 8.9–10.3)
Chloride: 107 mmol/L (ref 98–111)
Creatinine: 0.73 mg/dL (ref 0.44–1.00)
GFR, Estimated: >60 mL/min (ref >60)
Glucose, Bld: 157 mg/dL — ABNORMAL HIGH (ref 70–99)
Potassium: 3.9 mmol/L (ref 3.5–5.1)
Sodium: 142 mmol/L (ref 135–145)
Total Bilirubin: 0.6 mg/dL (ref 0.0–1.2)
Total Protein: 6.2 g/dL — ABNORMAL LOW (ref 6.5–8.1)

## 2024-02-05 LAB — CBC WITH DIFFERENTIAL (CANCER CENTER ONLY)
Abs Immature Granulocytes: 0.02 10*3/uL (ref 0.00–0.07)
Basophils Absolute: 0.1 10*3/uL (ref 0.0–0.1)
Basophils Relative: 1 %
Eosinophils Absolute: 0.2 10*3/uL (ref 0.0–0.5)
Eosinophils Relative: 3 %
HCT: 40.7 % (ref 36.0–46.0)
Hemoglobin: 14.3 g/dL (ref 12.0–15.0)
Immature Granulocytes: 0 %
Lymphocytes Relative: 13 %
Lymphs Abs: 0.6 10*3/uL — ABNORMAL LOW (ref 0.7–4.0)
MCH: 33.4 pg (ref 26.0–34.0)
MCHC: 35.1 g/dL (ref 30.0–36.0)
MCV: 95.1 fL (ref 80.0–100.0)
Monocytes Absolute: 0.2 10*3/uL (ref 0.1–1.0)
Monocytes Relative: 5 %
Neutro Abs: 3.9 10*3/uL (ref 1.7–7.7)
Neutrophils Relative %: 78 %
Platelet Count: 171 10*3/uL (ref 150–400)
RBC: 4.28 MIL/uL (ref 3.87–5.11)
RDW: 12.7 % (ref 11.5–15.5)
WBC Count: 5 10*3/uL (ref 4.0–10.5)
nRBC: 0 % (ref 0.0–0.2)

## 2024-02-05 MED ORDER — METOCLOPRAMIDE HCL 10 MG PO TABS: 10.0000 mg | ORAL_TABLET | Freq: Three times a day (TID) | ORAL | 1 refills | Status: DC | PRN

## 2024-02-05 MED ORDER — DARATUMUMAB-HYALURONIDASE-FIHJ 1800-30000 MG-UT/15ML ~~LOC~~ SOLN
1800.0000 mg | Freq: Once | SUBCUTANEOUS | Status: AC
  Administered 2024-02-05: 1800 mg via SUBCUTANEOUS
  Filled 2024-02-05: qty 15

## 2024-02-05 NOTE — Progress Notes (Signed)
 [facility] [phone]   [phone]  PROGRESS NOTE  Patient Care Team: Verena Mems, MD as PCP - General (Family Medicine) Pickenpack-Cousar, Fannie SAILOR, NP as Nurse Practitioner (Nurse Practitioner)  Hematological/Oncological History # Plasmacytoma of Thoracic Spine  # IgG Kappa Multiple myeloma 09/22/2022: Patient underwent MRI thoracic spine which showed acute or subacute T7, T10, and T11 compression fractures 10/10/2022: biopsy of compression fracture of T11 reveals a plasmacytoma 11/13/2022: establish care with Dr. Federico  12/06/2022: bone marrow biopsy showed plasma cell myeloma involving 80% of the marrow.  12/24/2022: Cycle 1 Day 1 of Velcade /Dex 01/14/2023: Cycle 2 Day 1 of Velcade /Dex 02/04/2023: Cycle 3 Day 1 of Velcade /Dex 02/26/2023: Cycle 4 Day 1 of Velcade /Dex HELD due to ocular toxicity 03/04/2023: Cycle 1 Day 1 of Dara/Dex  04/01/2023: Cycle 2 Day 1 of Dara/Dex  04/29/2023: Cycle 3 Day 1 of Dara/Dex  05/27/2023: Cycle 4 Day 1 of Dara/Dex  06/24/2023: Cycle 5 Day 1 of Dara/Dex  07/23/2023: Cycle 6 Day 1 of Dara/Rev/Dex  08/20/2023: Cycle 7 Day 1 of Dara/Rev/Dex  09/18/2023 Cycle 8 Day 1 of Dara/Rev/Dex  10/16/2023: Cycle 9 Day 1 of Dara/Rev/Dex  11/13/2023: Cycle 10 Day 1 of Dara/Rev/Dex  12/11/2023: Cycle 11 Day 1 of Dara/Rev/Dex  01/08/2024: Cycle 12 Day 1 of Dara/Rev/Dex   Interval History:  [name] 83 y.o. female with medical history significant for newly diagnosed IgG kappa multiple myeloma who presents for a follow up visit. The patient's last visit was on 01/08/2024.  In the interim since the last visit she has continued on Dara/Rev/Dex therapy.   On exam today [name] reports she has been well overall in the interim since our last visit.  She reports that she has a good strong appetite.  She reports her energy levels have been low and that is tough to get started.  She continues to take 2 tramadol  per day as well as 2  Tylenol  per day.  She is taken 325 mg of the Tylenol .  She reports that she has been sleeping quite well.  She tends to take her Revlimid  at night and is not having any difficulty with this medication.  She reports that she has had no recent issues with fevers, chills, sweats, nausea, vomiting or diarrhea.  Overall she feels well and is willing and able to continue on treatment this time.  A full 10 point ROS is otherwise negative.   MEDICAL HISTORY:  Past Medical History:  Diagnosis Date   Anxiety    Arthritis    Asthma    Hx: of   Cancer (HCC)    Hx: of skin cancer   Diverticulosis    GERD (gastroesophageal reflux disease)    Hx: of   History of kidney stones    Hyperlipemia    Hypertension    Pre-diabetes     SURGICAL HISTORY: Past Surgical History:  Procedure Laterality Date   ABDOMINAL SURGERY  09/19/2012   Ruptured Diverticuli, Ostomy Placed.   BREAST BIOPSY  06/06/2012   breast biopsy   CATARACT EXTRACTION W/ INTRAOCULAR LENS IMPLANT Bilateral    COLONOSCOPY  03/06/2012   COLOSTOMY CLOSURE  12/25/2012   Dr Vanderbilt   COLOSTOMY CLOSURE N/A 12/25/2012   Procedure: COLOSTOMY CLOSURE;  Surgeon: [name], MD;  Location: [facility] OR;  Service: General;  Laterality: N/A;   EXTRACORPOREAL SHOCK WAVE LITHOTRIPSY Left 11/17/2018   Procedure: EXTRACORPOREAL SHOCK WAVE LITHOTRIPSY (ESWL);  Surgeon: Sherrilee Belvie CROME, MD;  Location:  [facility];  Service: Urology;  Laterality: Left;   EYE SURGERY  10/19/1998   Hx: of cyst removed from left eye   KYPHOPLASTY N/A 10/10/2022   Procedure: KYPHOPLASTY Thoracic seven , Thoracic ten,Thoracic eleven;  Surgeon: [name], MD;  Location: [facility];  Service: Neurosurgery;  Laterality: N/A;   LAPAROTOMY N/A 09/19/2012   Procedure: EXPLORATORY LAPAROTOMY, sigmoid colectomy;  Surgeon: [name], MD;  Location: [facility];  Service: General;  Laterality: N/A;   SIGMOIDOSCOPY  01/11/1999   TONSILLECTOMY     TRIGGER FINGER RELEASE   12/25/2010   TUBAL LIGATION  10/16/1985    SOCIAL HISTORY: Social History   Socioeconomic History   Marital status: Widowed    Spouse name: Not on file   Number of children: Not on file   Years of education: Not on file   Highest education level: Not on file  Occupational History   Not on file  Tobacco Use   Smoking status: Never   Smokeless tobacco: Never  Vaping Use   Vaping status: Never Used  Substance and Sexual Activity   Alcohol use: No   Drug use: No   Sexual activity: Not Currently  Other Topics Concern   Not on file  Social History Narrative   Not on file   Social Drivers of Health   Financial Resource Strain: Not on file  Food Insecurity: Not on file  Transportation Needs: Not on file  Physical Activity: Not on file  Stress: Not on file  Social Connections: Not on file  Intimate Partner Violence: Not on file    FAMILY HISTORY: Family History  Problem Relation Age of Onset   Diabetes Father    Alzheimer's disease Father    Basal cell carcinoma Mother        Metastatic    ALLERGIES:  is allergic to hydrocodone , vioxx [rofecoxib], codeine, doxycycline hyclate, latex, and revlimid  [lenalidomide ].  MEDICATIONS:  Current Outpatient Medications  Medication Sig Dispense Refill   acyclovir  (ZOVIRAX ) 400 MG tablet Take 1 tablet (400 mg total) by mouth 2 (two) times daily. 180 tablet 0   ALPRAZolam  (XANAX ) 0.25 MG tablet Take 0.25 mg by mouth 3 (three) times daily as needed.     amLODipine  (NORVASC ) 5 MG tablet Take 5 mg by mouth daily.     aspirin  81 MG tablet Take 81 mg by mouth daily.     cetirizine (ZYRTEC) 10 MG tablet Take 10 mg by mouth daily.     Cholecalciferol (VITAMIN D3) 2000 UNITS capsule Take 2,000 Units by mouth daily.     dexamethasone  (DECADRON ) 4 MG tablet Take 40 mg (10 tablets) by mouth once a week. 40 tablet 3   dextromethorphan-guaiFENesin  (TUSSIN DM) 10-100 MG/5ML liquid Take 10 mLs by mouth every 4 (four) hours as needed for cough.      fluticasone  (FLONASE ) 50 MCG/ACT nasal spray Place 2 sprays into the nose at bedtime.     hydrOXYzine  (ATARAX ) 25 MG tablet Take 25 mg by mouth at bedtime.     Insulin  Regular Human (NOVOLIN R FLEXPEN IJ) Inject 10 Units as directed as needed. 10 units when blood sugar is 200 or above     lenalidomide  (REVLIMID ) 5 MG capsule TAKE 1 CAPSULE BY MOUTH 1 TIME A DAY FOR 21 DAYS ON THEN 7 DAYS OFF 21 capsule 0   metoCLOPramide  (REGLAN ) 10 MG tablet Take 1 tablet (10 mg total) by mouth every 8 (eight) hours as needed for nausea. 30 tablet 1  montelukast  (SINGULAIR ) 10 MG tablet Take 10 mg by mouth daily.     Probiotic Product (ALIGN PO) Take by mouth daily.     simvastatin  (ZOCOR ) 20 MG tablet Take 20 mg by mouth every evening.     traMADol  (ULTRAM ) 50 MG tablet TAKE 1 TO 2 TABLETS BY MOUTH EVERY 4 HOURS AS NEEDED 90 tablet 0   VEVYE 0.1 % SOLN Place 1 drop into both eyes in the morning and at bedtime.     No current facility-administered medications for this visit.    REVIEW OF SYSTEMS:   Constitutional: ( - ) fevers, ( - )  chills , ( - ) night sweats Eyes: ( - ) blurriness of vision, ( - ) double vision, ( - ) watery eyes Ears, nose, mouth, throat, and face: ( - ) mucositis, ( - ) sore throat Respiratory: ( - ) cough, ( - ) dyspnea, ( - ) wheezes Cardiovascular: ( - ) palpitation, ( - ) chest discomfort, ( - ) lower extremity swelling Gastrointestinal:  ( - ) nausea, ( - ) heartburn, ( - ) change in bowel habits Skin: ( - ) abnormal skin rashes Lymphatics: ( - ) new lymphadenopathy, ( - ) easy bruising Neurological: ( - ) numbness, ( - ) tingling, ( - ) new weaknesses Behavioral/Psych: ( - ) mood change, ( - ) new changes  All other systems were reviewed with the patient and are negative.  PHYSICAL EXAMINATION: ECOG PERFORMANCE STATUS: 1 - Symptomatic but completely ambulatory  Vitals:   02/05/24 1118  BP: 139/73  Pulse: 77  Resp: 14  Temp: (!) 97.5 F (36.4 C)  SpO2: 100%          Filed Weights   02/05/24 1118  Weight: 127 lb 3.2 oz (57.7 kg)      GENERAL: Well-appearing elderly Caucasian female, alert, no distress and comfortable SKIN: skin color, texture, turgor are normal, no rashes or significant lesions EYES: conjunctiva are pink and non-injected, sclera clear.  LUNGS: clear to auscultation and percussion with normal breathing effort HEART: regular rate & rhythm and no murmurs and no lower extremity edema Musculoskeletal: no cyanosis of digits and no clubbing  PSYCH: alert & oriented x 3, fluent speech NEURO: no focal motor/sensory deficits  LABORATORY DATA:  I have reviewed the data as listed    Latest Ref Rng & Units 02/05/2024   10:47 AM 01/08/2024    2:23 PM 12/11/2023   11:06 AM  CBC  WBC 4.0 - 10.5 K/uL 5.0  5.5  4.5   Hemoglobin 12.0 - 15.0 g/dL 85.6  84.5  84.4   Hematocrit 36.0 - 46.0 % 40.7  42.9  43.2   Platelets 150 - 400 K/uL 171  182  188        Latest Ref Rng & Units 02/05/2024   10:47 AM 01/08/2024    2:23 PM 12/11/2023   11:06 AM  CMP  Glucose 70 - 99 mg/dL 842  759  836   BUN 8 - 23 mg/dL 13  18  12    Creatinine 0.44 - 1.00 mg/dL 9.26  8.65  9.20   Sodium 135 - 145 mmol/L 142  139  140   Potassium 3.5 - 5.1 mmol/L 3.9  4.3  3.7   Chloride 98 - 111 mmol/L 107  106  104   CO2 22 - 32 mmol/L 29  25  27    Calcium  8.9 - 10.3 mg/dL 9.3  9.3  9.1  Total Protein 6.5 - 8.1 g/dL 6.2  6.7  6.6   Total Bilirubin 0.0 - 1.2 mg/dL 0.6  0.6  0.6   Alkaline Phos 38 - 126 U/L 52  54  60   AST 15 - 41 U/L 17  19  20    ALT 0 - 44 U/L 18  18  17      Lab Results  Component Value Date   MPROTEIN 0.1 (H) 01/08/2024   MPROTEIN Not Observed 12/11/2023   MPROTEIN 0.1 (H) 11/13/2023   Lab Results  Component Value Date   KPAFRELGTCHN 51.3 (H) 02/05/2024   KPAFRELGTCHN 45.0 (H) 01/08/2024   KPAFRELGTCHN 48.4 (H) 12/11/2023   LAMBDASER 3.7 (L) 02/05/2024   LAMBDASER 2.9 (L) 01/08/2024   LAMBDASER 3.3 (L) 12/11/2023   KAPLAMBRATIO  13.86 (H) 02/05/2024   KAPLAMBRATIO 15.52 (H) 01/08/2024   KAPLAMBRATIO 14.67 (H) 12/11/2023   Lab Results  Component Value Date   MPROTEIN 0.1 (H) 01/08/2024   MPROTEIN Not Observed 12/11/2023   MPROTEIN 0.1 (H) 11/13/2023   Lab Results  Component Value Date   KPAFRELGTCHN 51.3 (H) 02/05/2024   KPAFRELGTCHN 45.0 (H) 01/08/2024   KPAFRELGTCHN 48.4 (H) 12/11/2023   LAMBDASER 3.7 (L) 02/05/2024   LAMBDASER 2.9 (L) 01/08/2024   LAMBDASER 3.3 (L) 12/11/2023   KAPLAMBRATIO 13.86 (H) 02/05/2024   KAPLAMBRATIO 15.52 (H) 01/08/2024   KAPLAMBRATIO 14.67 (H) 12/11/2023     RADIOGRAPHIC STUDIES: No results found.  ASSESSMENT & PLAN Alice [facility] is a 83 y.o.  female with medical history significant for IgG kappa multiple myeloma who presents for a follow up visit.   # IgG Kappa Multiple Myeloma # Plasmacytoma of Spine  -- Patient diagnosed with plasmacytoma of the spine, subsequent bone marrow biopsy confirmed an IgG kappa multiple myeloma involving the bone marrow --start of Velcade /Dex Cycle 1 Day 1 on 12/24/2022. --Due to ocular toxicity, recommended to discontinue Velcade  therapy and switch to Daratumumab .  --Started Revlimid  on 07/17/2023 at 10 mg PO daily. Dose reduced to 5 mg PO daily on 10/30/2023 due to rash.  Plan: --Due for Cycle 13, Day 1 of Dara/Rev/Dex --Tolerating lower dose of Revlimid  without any new toxicities. (5 mg PO daily x 21 days with 7 days off) --Labs today show WBC 5.0, hemoglobin 14.3, MCV 95.1, platelets 171.  Creatinine and LFTs are normal.  --Last M protein 0.1 on 01/08/2024 with kappa free light chains stable at 51.3 -- Return to clinic in 4 weeks with continued Dara/Rev/Dex.    #Ocular toxicity/Stye --Secondary to Velcade  therapy --Used Moxifloxacin  drops with improvement  #Supportive Care -- chemotherapy education complete -- port placement not required   -- zofran  8mg  q8H PRN and compazine  10mg  PO q6H for nausea -- acyclovir  400mg  PO BID for VCZ  prophylaxis -- Recieved dental clearance prior to the start of Xgeva/Zometa . First dose on 01/07/2023. Last dose on 01/08/2024. Next dose in September 2025 -- Tramadol  50 mg every 6 hours as needed for pain control.  Currently taking 2 daily.  No orders of the defined types were placed in this encounter.  All questions were answered. The patient knows to call the clinic with any problems, questions or concerns.  I have spent a total of 30 minutes minutes of face-to-face and non-face-to-face time, preparing to see the patient, performing a medically appropriate examination, counseling and educating the patient,  documenting clinical information in the electronic health record,and care coordination.   Norleen IVAR Kidney, MD Department of Hematology/Oncology [facility] Phone: [phone] Pager: [phone] Email: [email or web address]   02/09/2024 5:53 PM

## 2024-02-05 NOTE — Progress Notes (Signed)
 Palliative Medicine [facility]  [phone]) [phone] [phone]   Name: Alice [facility] Date: 02/05/2024 MRN: [id number]  DOB: [birth year]  Patient Care Team: Verena Mems, MD as PCP - General (Family Medicine) Pickenpack-Cousar, Fannie SAILOR, NP as Nurse Practitioner (Nurse Practitioner)   INTERVAL HISTORY: Alice [facility] is a 83 y.o. female with oncologic medical history including recent diagnosis of multiple myeloma (12/2022), DM, and posterior vitreous detachment of left and right eyes. Palliative ask to see for symptom management and goals of care   SOCIAL HISTORY:     reports that she has never smoked. She has never used smokeless tobacco. She reports that she does not drink alcohol and does not use drugs.  ADVANCE DIRECTIVES:  Advanced directives on file  CODE STATUS: Full code  PAST MEDICAL HISTORY: Past Medical History:  Diagnosis Date   Anxiety    Arthritis    Asthma    Hx: of   Cancer (HCC)    Hx: of skin cancer   Diverticulosis    GERD (gastroesophageal reflux disease)    Hx: of   History of kidney stones    Hyperlipemia    Hypertension    Pre-diabetes     ALLERGIES:  is allergic to hydrocodone , vioxx [rofecoxib], codeine, doxycycline hyclate, latex, and revlimid  [lenalidomide ].  MEDICATIONS:  Current Outpatient Medications  Medication Sig Dispense Refill   acyclovir  (ZOVIRAX ) 400 MG tablet Take 1 tablet (400 mg total) by mouth 2 (two) times daily. 180 tablet 0   ALPRAZolam  (XANAX ) 0.25 MG tablet Take 0.25 mg by mouth 3 (three) times daily as needed.     amLODipine  (NORVASC ) 5 MG tablet Take 5 mg by mouth daily.     aspirin  81 MG tablet Take 81 mg by mouth daily.     cetirizine (ZYRTEC) 10 MG tablet Take 10 mg by mouth daily.     Cholecalciferol (VITAMIN D3) 2000 UNITS capsule Take 2,000 Units by mouth daily.     dexamethasone  (DECADRON ) 4 MG tablet Take 40 mg (10 tablets) by mouth once a week. 40 tablet 3    dextromethorphan-guaiFENesin  (TUSSIN DM) 10-100 MG/5ML liquid Take 10 mLs by mouth every 4 (four) hours as needed for cough.     fluticasone  (FLONASE ) 50 MCG/ACT nasal spray Place 2 sprays into the nose at bedtime.     hydrOXYzine  (ATARAX ) 25 MG tablet Take 25 mg by mouth at bedtime.     Insulin  Regular Human (NOVOLIN R FLEXPEN IJ) Inject 10 Units as directed as needed. 10 units when blood sugar is 200 or above     lenalidomide  (REVLIMID ) 5 MG capsule TAKE 1 CAPSULE BY MOUTH 1 TIME A DAY FOR 21 DAYS ON THEN 7 DAYS OFF 21 capsule 0   montelukast  (SINGULAIR ) 10 MG tablet Take 10 mg by mouth daily.     Probiotic Product (ALIGN PO) Take by mouth daily.     simvastatin  (ZOCOR ) 20 MG tablet Take 20 mg by mouth every evening.     traMADol  (ULTRAM ) 50 MG tablet TAKE 1 TO 2 TABLETS BY MOUTH EVERY 4 HOURS AS NEEDED 90 tablet 0   VEVYE 0.1 % SOLN Place 1 drop into both eyes in the morning and at bedtime.     No current facility-administered medications for this visit.    VITAL SIGNS: There were no vitals taken for this visit. There were no vitals filed for this visit.  Estimated body mass index is 24.03 kg/m as calculated from the  following:   Height as of 11/13/23: 5' 1 (1.549 m).   Weight as of an earlier encounter on 02/05/24: 127 lb 3.2 oz (57.7 kg).   PERFORMANCE STATUS (ECOG) : 2 - Symptomatic, <50% confined to bed  Discussed the use of AI scribe software for clinical note transcription with the patient, who gave verbal consent to proceed.  History of Present Illness Alice Anthony is an 83 year old female who was seen during her infusion for follow-up. She is accompanied by her daughter. No acute distress. Occasional fatigue however despite this she continues to remain active doing things around her home. Denies concerns for nausea, vomiting, constipation, or diarrhea.   Ms. Alice Anthony takes two tramadol  once a day for pain management . Pain is well managed. Does not require around the  clock use of Tramadol . No adjustments to regiment.   Her energy levels are low, and she feels that her head wants to do more than her body can manage, although she sometimes pushes through this feeling. Recently, her children took her to the beach, but she was unable to participate fully due to mobility issues, requiring assistance from a lifeguard and her daughters to walk on the sand.   All symptoms well managed. All questions answered and support provided.  Goals of Care 5/20- We discussed her current illness and what it means in the larger context of her on-going co-morbidities. Natural disease trajectory and expectations were discussed.   Patient and daughter are realistic in their understanding of current illness. Is remaining hopeful for stability and improve quality of life.   We discussed Her current illness and what it means in the larger context of Her on-going co-morbidities. Natural disease trajectory and expectations were discussed.  I discussed the importance of continued conversation with family and their medical providers regarding overall plan of care and treatment options, ensuring decisions are within the context of the patients values and GOCs. Assessment & Plan Pain management with Tramadol  and Tylenol  Chronic pain managed with as needed tramadol .  Generally requiring once a day.  Discussed safe concurrent use and dosing flexibility. - Educated on combined use of Tylenol  Arthritis and Tramadol . - Advised Tramadol  every 4 hours PRN for severe pain. - Encouraged trial of Tylenol  Arthritis. - We will continue to closely monitor and adjust regimen as needed.  Constipation due to medication Constipation likely from medication. Managed with medication balance, but bowel habits vary.  Follow-up Follow-up in 3-4 weeks.  Sooner if needed.    Patient expressed understanding and was in agreement with this plan. She also understands that She can call the clinic at any time with  any questions, concerns, or complaints.   Any controlled substances utilized were prescribed in the context of palliative care. PDMP has been reviewed.   Visit consisted of counseling and education dealing with the complex and emotionally intense issues of symptom management and palliative care in the setting of serious and potentially life-threatening illness.  Levon Borer, AGPCNP-BC  Palliative Medicine Team/[facility]

## 2024-02-05 NOTE — Patient Instructions (Signed)

## 2024-02-06 LAB — KAPPA/LAMBDA LIGHT CHAINS
Kappa free light chain: 51.3 mg/L — ABNORMAL HIGH (ref 3.3–19.4)
Kappa, lambda light chain ratio: 13.86 — ABNORMAL HIGH (ref 0.26–1.65)
Lambda free light chains: 3.7 mg/L — ABNORMAL LOW (ref 5.7–26.3)

## 2024-02-09 ENCOUNTER — Encounter: Payer: Self-pay | Admitting: Hematology and Oncology

## 2024-02-09 LAB — MULTIPLE MYELOMA PANEL, SERUM
Albumin SerPl Elph-Mcnc: 3.8 g/dL (ref 2.9–4.4)
Albumin/Glob SerPl: 2 — ABNORMAL HIGH (ref 0.7–1.7)
Alpha 1: 0.2 g/dL (ref 0.0–0.4)
Alpha2 Glob SerPl Elph-Mcnc: 0.8 g/dL (ref 0.4–1.0)
B-Globulin SerPl Elph-Mcnc: 0.8 g/dL (ref 0.7–1.3)
Gamma Glob SerPl Elph-Mcnc: 0.3 g/dL — ABNORMAL LOW (ref 0.4–1.8)
Globulin, Total: 2 g/dL — ABNORMAL LOW (ref 2.2–3.9)
IgA: 17 mg/dL — ABNORMAL LOW (ref 64–422)
IgG (Immunoglobin G), Serum: 318 mg/dL — ABNORMAL LOW (ref 586–1602)
IgM (Immunoglobulin M), Srm: 17 mg/dL — ABNORMAL LOW (ref 26–217)
Total Protein ELP: 5.8 g/dL — ABNORMAL LOW (ref 6.0–8.5)

## 2024-02-12 ENCOUNTER — Other Ambulatory Visit: Payer: Self-pay | Admitting: Hematology and Oncology

## 2024-02-12 ENCOUNTER — Other Ambulatory Visit: Payer: Self-pay | Admitting: *Deleted

## 2024-02-12 MED ORDER — LENALIDOMIDE 5 MG PO CAPS: 5.0000 mg | ORAL_CAPSULE | Freq: Every day | ORAL | 0 refills | Status: DC

## 2024-02-14 ENCOUNTER — Telehealth: Payer: Self-pay | Admitting: Hematology and Oncology

## 2024-03-03 ENCOUNTER — Other Ambulatory Visit: Payer: Self-pay | Admitting: Nurse Practitioner

## 2024-03-03 DIAGNOSIS — C9000 Multiple myeloma not having achieved remission: Secondary | ICD-10-CM

## 2024-03-03 DIAGNOSIS — G893 Neoplasm related pain (acute) (chronic): Secondary | ICD-10-CM

## 2024-03-03 DIAGNOSIS — Z515 Encounter for palliative care: Secondary | ICD-10-CM

## 2024-03-05 ENCOUNTER — Inpatient Hospital Stay

## 2024-03-05 ENCOUNTER — Inpatient Hospital Stay: Admitting: Hematology and Oncology

## 2024-03-11 ENCOUNTER — Other Ambulatory Visit: Payer: Self-pay

## 2024-03-11 ENCOUNTER — Other Ambulatory Visit: Payer: Self-pay | Admitting: Hematology and Oncology

## 2024-03-11 ENCOUNTER — Encounter: Payer: Self-pay | Admitting: Hematology and Oncology

## 2024-03-11 ENCOUNTER — Inpatient Hospital Stay

## 2024-03-11 ENCOUNTER — Inpatient Hospital Stay: Attending: Hematology and Oncology

## 2024-03-11 ENCOUNTER — Encounter

## 2024-03-11 ENCOUNTER — Inpatient Hospital Stay (HOSPITAL_BASED_OUTPATIENT_CLINIC_OR_DEPARTMENT_OTHER): Admitting: Hematology and Oncology

## 2024-03-11 VITALS — BP 140/86 | HR 70 | Temp 98.2°F | Resp 14 | Wt 127.8 lb

## 2024-03-11 DIAGNOSIS — G893 Neoplasm related pain (acute) (chronic): Secondary | ICD-10-CM | POA: Diagnosis not present

## 2024-03-11 DIAGNOSIS — Z79899 Other long term (current) drug therapy: Secondary | ICD-10-CM | POA: Diagnosis not present

## 2024-03-11 DIAGNOSIS — C9000 Multiple myeloma not having achieved remission: Secondary | ICD-10-CM | POA: Diagnosis not present

## 2024-03-11 DIAGNOSIS — Z5112 Encounter for antineoplastic immunotherapy: Secondary | ICD-10-CM | POA: Insufficient documentation

## 2024-03-11 DIAGNOSIS — Z515 Encounter for palliative care: Secondary | ICD-10-CM | POA: Diagnosis not present

## 2024-03-11 LAB — CBC WITH DIFFERENTIAL (CANCER CENTER ONLY)
Abs Immature Granulocytes: 0.01 K/uL (ref 0.00–0.07)
Basophils Absolute: 0.1 K/uL (ref 0.0–0.1)
Basophils Relative: 1 %
Eosinophils Absolute: 0.2 K/uL (ref 0.0–0.5)
Eosinophils Relative: 5 %
HCT: 40.1 % (ref 36.0–46.0)
Hemoglobin: 14.3 g/dL (ref 12.0–15.0)
Immature Granulocytes: 0 %
Lymphocytes Relative: 12 %
Lymphs Abs: 0.6 K/uL — ABNORMAL LOW (ref 0.7–4.0)
MCH: 33.6 pg (ref 26.0–34.0)
MCHC: 35.7 g/dL (ref 30.0–36.0)
MCV: 94.4 fL (ref 80.0–100.0)
Monocytes Absolute: 0.3 K/uL (ref 0.1–1.0)
Monocytes Relative: 7 %
Neutro Abs: 3.8 K/uL (ref 1.7–7.7)
Neutrophils Relative %: 75 %
Platelet Count: 123 K/uL — ABNORMAL LOW (ref 150–400)
RBC: 4.25 MIL/uL (ref 3.87–5.11)
RDW: 13.2 % (ref 11.5–15.5)
WBC Count: 5 K/uL (ref 4.0–10.5)
nRBC: 0 % (ref 0.0–0.2)

## 2024-03-11 LAB — CMP (CANCER CENTER ONLY)
ALT: 18 U/L (ref 0–44)
AST: 17 U/L (ref 15–41)
Albumin: 4.1 g/dL (ref 3.5–5.0)
Alkaline Phosphatase: 53 U/L (ref 38–126)
Anion gap: 6 (ref 5–15)
BUN: 12 mg/dL (ref 8–23)
CO2: 27 mmol/L (ref 22–32)
Calcium: 8.7 mg/dL — ABNORMAL LOW (ref 8.9–10.3)
Chloride: 108 mmol/L (ref 98–111)
Creatinine: 0.68 mg/dL (ref 0.44–1.00)
GFR, Estimated: >60 mL/min (ref >60)
Glucose, Bld: 184 mg/dL — ABNORMAL HIGH (ref 70–99)
Potassium: 4.1 mmol/L (ref 3.5–5.1)
Sodium: 141 mmol/L (ref 135–145)
Total Bilirubin: 0.6 mg/dL (ref 0.0–1.2)
Total Protein: 5.9 g/dL — ABNORMAL LOW (ref 6.5–8.1)

## 2024-03-11 MED ORDER — DIPHENHYDRAMINE HCL 25 MG PO CAPS: 25.0000 mg | ORAL_CAPSULE | Freq: Once | ORAL | Status: DC

## 2024-03-11 MED ORDER — MONTELUKAST SODIUM 10 MG PO TABS: 10.0000 mg | ORAL_TABLET | Freq: Once | ORAL | Status: DC

## 2024-03-11 MED ORDER — DARATUMUMAB-HYALURONIDASE-FIHJ 1800-30000 MG-UT/15ML ~~LOC~~ SOLN
1800.0000 mg | Freq: Once | SUBCUTANEOUS | Status: AC
  Administered 2024-03-11: 1800 mg via SUBCUTANEOUS
  Filled 2024-03-11: qty 15

## 2024-03-11 MED ORDER — ACETAMINOPHEN 325 MG PO TABS: 650.0000 mg | ORAL_TABLET | Freq: Once | ORAL | Status: DC

## 2024-03-11 NOTE — Progress Notes (Addendum)
 PGY2 Oncology Resident Pharmacy Encounter   Rounded with Dr. Federico. Saw patient in clinic today accompanied by their daughter.  Intervention: - Patient is receiving Zometa  3.3 mg every 12 weeks. There last dose was 01/08/2024. They are due for their next dose the week of August 24. Per Dr. Federico it is ok to schedule the dose to be given with their infusions scheduled 04/13/2024.  - Plan modified to change date and messaged sent to MD regarding dose.  - Treatment plan adjusted to correct dates for each cycle. Modification to cycle 15 for correct cycle length (28 days) to start 04/13/2024.    Addendum 03/16/2024 - Per Dr. Federico keeping Zometa  dosing in treatment plan at 4 mg. Renal function to be evaluated before receiving each dose.   Thank you for allowing pharmacy to participate in this patient's care.  Alfonso MARLA Buys, PharmD Pharmacy Resident  03/11/2024 1:29 PM

## 2024-03-11 NOTE — Patient Instructions (Signed)

## 2024-03-11 NOTE — Progress Notes (Signed)
 [facility] [phone]   [phone]  PROGRESS NOTE  Patient Care Team: Verena Mems, MD as PCP - General (Family Medicine) Pickenpack-Cousar, Fannie SAILOR, NP as Nurse Practitioner (Nurse Practitioner)  Hematological/Oncological History # Plasmacytoma of Thoracic Spine  # IgG Kappa Multiple myeloma 09/22/2022: Patient underwent MRI thoracic spine which showed acute or subacute T7, T10, and T11 compression fractures 10/10/2022: biopsy of compression fracture of T11 reveals a plasmacytoma 11/13/2022: establish care with Dr. Federico  12/06/2022: bone marrow biopsy showed plasma cell myeloma involving 80% of the marrow.  12/24/2022: Cycle 1 Day 1 of Velcade /Dex 01/14/2023: Cycle 2 Day 1 of Velcade /Dex 02/04/2023: Cycle 3 Day 1 of Velcade /Dex 02/26/2023: Cycle 4 Day 1 of Velcade /Dex HELD due to ocular toxicity 03/04/2023: Cycle 1 Day 1 of Dara/Dex  04/01/2023: Cycle 2 Day 1 of Dara/Dex  04/29/2023: Cycle 3 Day 1 of Dara/Dex  05/27/2023: Cycle 4 Day 1 of Dara/Dex  06/24/2023: Cycle 5 Day 1 of Dara/Dex  07/23/2023: Cycle 6 Day 1 of Dara/Rev/Dex  08/20/2023: Cycle 7 Day 1 of Dara/Rev/Dex  09/18/2023 Cycle 8 Day 1 of Dara/Rev/Dex  10/16/2023: Cycle 9 Day 1 of Dara/Rev/Dex  11/13/2023: Cycle 10 Day 1 of Dara/Rev/Dex  12/11/2023: Cycle 11 Day 1 of Dara/Rev/Dex  01/08/2024: Cycle 12 Day 1 of Dara/Rev/Dex   Interval History:  Alice Anthony 83 y.o. female with medical history significant for newly diagnosed IgG kappa multiple myeloma who presents for a follow up visit. The patient's last visit was on 02/05/2024.  In the interim since the last visit she has continued on Dara/Rev/Dex therapy.   On exam today Alice Anthony reports she has been well overall in the interim since her last visit 1 month ago.  She reports her appetite and energy are both quite good.  She reports that the pain in her back is tolerable.  She reports that the it has worsened recently and thinks it may be  due to the weather.  She reports that she is almost done with her current cycle of Revlimid .  She notes that she has been having some trouble with clenching her teeth and her dentist is currently making her a mouthguard.  She reports that she has been sleeping well.  She otherwise denies any fevers, chills, sweats.  She is not having any nausea, vomiting, or diarrhea.  Overall she feels well and is willing and able to continue on Darzalex , Revlimid , and dexamethasone  at this time.  Full 10 point ROS is otherwise negative.   MEDICAL HISTORY:  Past Medical History:  Diagnosis Date   Anxiety    Arthritis    Asthma    Hx: of   Cancer (HCC)    Hx: of skin cancer   Diverticulosis    GERD (gastroesophageal reflux disease)    Hx: of   History of kidney stones    Hyperlipemia    Hypertension    Pre-diabetes     SURGICAL HISTORY: Past Surgical History:  Procedure Laterality Date   ABDOMINAL SURGERY  09/19/2012   Ruptured Diverticuli, Ostomy Placed.   BREAST BIOPSY  06/06/2012   breast biopsy   CATARACT EXTRACTION W/ INTRAOCULAR LENS IMPLANT Bilateral    COLONOSCOPY  03/06/2012   COLOSTOMY CLOSURE  12/25/2012   Dr Vanderbilt   COLOSTOMY CLOSURE N/A 12/25/2012   Procedure: COLOSTOMY CLOSURE;  Surgeon: [name], MD;  Location: [facility] OR;  Service: General;  Laterality: N/A;   EXTRACORPOREAL SHOCK WAVE LITHOTRIPSY Left 11/17/2018   Procedure:  EXTRACORPOREAL SHOCK WAVE LITHOTRIPSY (ESWL);  Surgeon: Sherrilee Belvie CROME, MD;  Location: [facility] ORS;  Service: Urology;  Laterality: Left;   EYE SURGERY  10/19/1998   Hx: of cyst removed from left eye   KYPHOPLASTY N/A 10/10/2022   Procedure: KYPHOPLASTY Thoracic seven , Thoracic ten,Thoracic eleven;  Surgeon: [name], MD;  Location: [facility] OR;  Service: Neurosurgery;  Laterality: N/A;   LAPAROTOMY N/A 09/19/2012   Procedure: EXPLORATORY LAPAROTOMY, sigmoid colectomy;  Surgeon: [name], MD;  Location: [facility];  Service: General;   Laterality: N/A;   SIGMOIDOSCOPY  01/11/1999   TONSILLECTOMY     TRIGGER FINGER RELEASE  12/25/2010   TUBAL LIGATION  10/16/1985    SOCIAL HISTORY: Social History   Socioeconomic History   Marital status: Widowed    Spouse name: Not on file   Number of children: Not on file   Years of education: Not on file   Highest education level: Not on file  Occupational History   Not on file  Tobacco Use   Smoking status: Never   Smokeless tobacco: Never  Vaping Use   Vaping status: Never Used  Substance and Sexual Activity   Alcohol use: No   Drug use: No   Sexual activity: Not Currently  Other Topics Concern   Not on file  Social History Narrative   Not on file   Social Drivers of Health   Financial Resource Strain: Not on file  Food Insecurity: Not on file  Transportation Needs: Not on file  Physical Activity: Not on file  Stress: Not on file  Social Connections: Not on file  Intimate Partner Violence: Not on file    FAMILY HISTORY: Family History  Problem Relation Age of Onset   Diabetes Father    Alzheimer's disease Father    Basal cell carcinoma Mother        Metastatic    ALLERGIES:  is allergic to hydrocodone , vioxx [rofecoxib], codeine, doxycycline hyclate, latex, and revlimid  [lenalidomide ].  MEDICATIONS:  Current Outpatient Medications  Medication Sig Dispense Refill   cycloSPORINE (RESTASIS) 0.05 % ophthalmic emulsion 1 drop 2 (two) times daily.     acyclovir  (ZOVIRAX ) 400 MG tablet Take 1 tablet (400 mg total) by mouth 2 (two) times daily. 180 tablet 0   ALPRAZolam  (XANAX ) 0.25 MG tablet Take 0.25 mg by mouth 3 (three) times daily as needed.     amLODipine  (NORVASC ) 5 MG tablet Take 5 mg by mouth daily.     aspirin  81 MG tablet Take 81 mg by mouth daily.     cetirizine (ZYRTEC) 10 MG tablet Take 10 mg by mouth daily.     Cholecalciferol (VITAMIN D3) 2000 UNITS capsule Take 2,000 Units by mouth daily.     dexamethasone  (DECADRON ) 4 MG tablet Take 40 mg  (10 tablets) by mouth once a week. 40 tablet 3   dextromethorphan-guaiFENesin  (TUSSIN DM) 10-100 MG/5ML liquid Take 10 mLs by mouth every 4 (four) hours as needed for cough.     fluticasone  (FLONASE ) 50 MCG/ACT nasal spray Place 2 sprays into the nose at bedtime.     hydrOXYzine  (ATARAX ) 25 MG tablet Take 25 mg by mouth at bedtime.     Insulin  Regular Human (NOVOLIN R FLEXPEN IJ) Inject 10 Units as directed as needed. 10 units when blood sugar is 200 or above     lenalidomide  (REVLIMID ) 5 MG capsule TAKE 1 CAPSULE BY MOUTH 1 TIME A DAY FOR 21 DAYS ON THEN 7 DAYS OFF 21  capsule 0   metoCLOPramide  (REGLAN ) 10 MG tablet Take 1 tablet (10 mg total) by mouth every 8 (eight) hours as needed for nausea. 30 tablet 1   montelukast  (SINGULAIR ) 10 MG tablet Take 10 mg by mouth daily.     Probiotic Product (ALIGN PO) Take by mouth daily.     simvastatin  (ZOCOR ) 20 MG tablet Take 20 mg by mouth every evening.     traMADol  (ULTRAM ) 50 MG tablet TAKE 1 TO 2 TABLETS BY MOUTH EVERY 4 HOURS AS NEEDED 90 tablet 0   No current facility-administered medications for this visit.    REVIEW OF SYSTEMS:   Constitutional: ( - ) fevers, ( - )  chills , ( - ) night sweats Eyes: ( - ) blurriness of vision, ( - ) double vision, ( - ) watery eyes Ears, nose, mouth, throat, and face: ( - ) mucositis, ( - ) sore throat Respiratory: ( - ) cough, ( - ) dyspnea, ( - ) wheezes Cardiovascular: ( - ) palpitation, ( - ) chest discomfort, ( - ) lower extremity swelling Gastrointestinal:  ( - ) nausea, ( - ) heartburn, ( - ) change in bowel habits Skin: ( - ) abnormal skin rashes Lymphatics: ( - ) new lymphadenopathy, ( - ) easy bruising Neurological: ( - ) numbness, ( - ) tingling, ( - ) new weaknesses Behavioral/Psych: ( - ) mood change, ( - ) new changes  All other systems were reviewed with the patient and are negative.  PHYSICAL EXAMINATION: ECOG PERFORMANCE STATUS: 1 - Symptomatic but completely ambulatory  Vitals:    03/11/24 1151  BP: (!) 140/86  Pulse: 70  Resp: 14  Temp: 98.2 F (36.8 C)  SpO2: 100%   Filed Weights   03/11/24 1151  Weight: 127 lb 12.8 oz (58 kg)    GENERAL: Well-appearing elderly Caucasian female, alert, no distress and comfortable SKIN: skin color, texture, turgor are normal, no rashes or significant lesions EYES: conjunctiva are pink and non-injected, sclera clear.  LUNGS: clear to auscultation and percussion with normal breathing effort HEART: regular rate & rhythm and no murmurs and no lower extremity edema Musculoskeletal: no cyanosis of digits and no clubbing  PSYCH: alert & oriented x 3, fluent speech NEURO: no focal motor/sensory deficits  LABORATORY DATA:  I have reviewed the data as listed    Latest Ref Rng & Units 03/11/2024   11:36 AM 02/05/2024   10:47 AM 01/08/2024    2:23 PM  CBC  WBC 4.0 - 10.5 K/uL 5.0  5.0  5.5   Hemoglobin 12.0 - 15.0 g/dL 85.6  85.6  84.5   Hematocrit 36.0 - 46.0 % 40.1  40.7  42.9   Platelets 150 - 400 K/uL 123  171  182        Latest Ref Rng & Units 03/11/2024   11:36 AM 02/05/2024   10:47 AM 01/08/2024    2:23 PM  CMP  Glucose 70 - 99 mg/dL 815  842  759   BUN 8 - 23 mg/dL 12  13  18    Creatinine 0.44 - 1.00 mg/dL 9.31  9.26  8.65   Sodium 135 - 145 mmol/L 141  142  139   Potassium 3.5 - 5.1 mmol/L 4.1  3.9  4.3   Chloride 98 - 111 mmol/L 108  107  106   CO2 22 - 32 mmol/L 27  29  25    Calcium  8.9 - 10.3 mg/dL 8.7  9.3  9.3   Total Protein 6.5 - 8.1 g/dL 5.9  6.2  6.7   Total Bilirubin 0.0 - 1.2 mg/dL 0.6  0.6  0.6   Alkaline Phos 38 - 126 U/L 53  52  54   AST 15 - 41 U/L 17  17  19    ALT 0 - 44 U/L 18  18  18      Lab Results  Component Value Date   MPROTEIN Not Observed 02/05/2024   MPROTEIN 0.1 (H) 01/08/2024   MPROTEIN Not Observed 12/11/2023   Lab Results  Component Value Date   KPAFRELGTCHN 51.3 (H) 02/05/2024   KPAFRELGTCHN 45.0 (H) 01/08/2024   KPAFRELGTCHN 48.4 (H) 12/11/2023   LAMBDASER 3.7 (L) 02/05/2024    LAMBDASER 2.9 (L) 01/08/2024   LAMBDASER 3.3 (L) 12/11/2023   KAPLAMBRATIO 13.86 (H) 02/05/2024   KAPLAMBRATIO 15.52 (H) 01/08/2024   KAPLAMBRATIO 14.67 (H) 12/11/2023   Lab Results  Component Value Date   MPROTEIN Not Observed 02/05/2024   MPROTEIN 0.1 (H) 01/08/2024   MPROTEIN Not Observed 12/11/2023   Lab Results  Component Value Date   KPAFRELGTCHN 51.3 (H) 02/05/2024   KPAFRELGTCHN 45.0 (H) 01/08/2024   KPAFRELGTCHN 48.4 (H) 12/11/2023   LAMBDASER 3.7 (L) 02/05/2024   LAMBDASER 2.9 (L) 01/08/2024   LAMBDASER 3.3 (L) 12/11/2023   KAPLAMBRATIO 13.86 (H) 02/05/2024   KAPLAMBRATIO 15.52 (H) 01/08/2024   KAPLAMBRATIO 14.67 (H) 12/11/2023     RADIOGRAPHIC STUDIES: No results found.  ASSESSMENT & PLAN Alice Anthony is a 83 y.o.  female with medical history significant for IgG kappa multiple myeloma who presents for a follow up visit.   # IgG Kappa Multiple Myeloma # Plasmacytoma of Spine  -- Patient diagnosed with plasmacytoma of the spine, subsequent bone marrow biopsy confirmed an IgG kappa multiple myeloma involving the bone marrow --start of Velcade /Dex Cycle 1 Day 1 on 12/24/2022. --Due to ocular toxicity, recommended to discontinue Velcade  therapy and switch to Daratumumab .  --Started Revlimid  on 07/17/2023 at 10 mg PO daily. Dose reduced to 5 mg PO daily on 10/30/2023 due to rash.  Plan: --Due for Cycle 14, Day 1 of Dara/Rev/Dex --Tolerating lower dose of Revlimid  without any new toxicities. (5 mg PO daily x 21 days with 7 days off) --Labs today show WBC 5.0, hemoglobin 14.3, MCV 94.4, platelets 123.  Creatinine and LFTs are normal.  --Last M protein 0.1 on 01/08/2024 with kappa free light chains stable at 51.3 -- Return to clinic in 4 weeks with continued Dara/Rev/Dex.    #Ocular toxicity/Stye --Secondary to Velcade  therapy --Used Moxifloxacin  drops with improvement  #Supportive Care -- chemotherapy education complete -- port placement not required   --  zofran  8mg  q8H PRN and compazine  10mg  PO q6H for nausea -- acyclovir  400mg  PO BID for VCZ prophylaxis -- Recieved dental clearance prior to the start of Xgeva/Zometa . First dose on 01/07/2023. Last dose on 01/08/2024. Next dose in September 2025 -- Tramadol  50 mg every 6 hours as needed for pain control.  Currently taking 2 daily.  No orders of the defined types were placed in this encounter.  All questions were answered. The patient knows to call the clinic with any problems, questions or concerns.  I have spent a total of 30 minutes minutes of face-to-face and non-face-to-face time, preparing to see the patient, performing a medically appropriate examination, counseling and educating the patient,  documenting clinical information in the electronic health record,and care coordination.   Norleen IVAR Kidney, MD Department of Hematology/Oncology   [facility] Phone: [phone] Pager: [phone] Email: [email or web address]   03/11/2024 8:36 PM

## 2024-03-12 ENCOUNTER — Other Ambulatory Visit: Payer: Self-pay | Admitting: Hematology and Oncology

## 2024-03-12 LAB — KAPPA/LAMBDA LIGHT CHAINS
Kappa free light chain: 33.6 mg/L — ABNORMAL HIGH (ref 3.3–19.4)
Kappa, lambda light chain ratio: 9.08 — ABNORMAL HIGH (ref 0.26–1.65)
Lambda free light chains: 3.7 mg/L — ABNORMAL LOW (ref 5.7–26.3)

## 2024-03-13 LAB — MULTIPLE MYELOMA PANEL, SERUM
Albumin SerPl Elph-Mcnc: 3.5 g/dL (ref 2.9–4.4)
Albumin/Glob SerPl: 1.8 — ABNORMAL HIGH (ref 0.7–1.7)
Alpha 1: 0.2 g/dL (ref 0.0–0.4)
Alpha2 Glob SerPl Elph-Mcnc: 0.7 g/dL (ref 0.4–1.0)
B-Globulin SerPl Elph-Mcnc: 0.8 g/dL (ref 0.7–1.3)
Gamma Glob SerPl Elph-Mcnc: 0.3 g/dL — ABNORMAL LOW (ref 0.4–1.8)
Globulin, Total: 2 g/dL — ABNORMAL LOW (ref 2.2–3.9)
IgA: 19 mg/dL — ABNORMAL LOW (ref 64–422)
IgG (Immunoglobin G), Serum: 297 mg/dL — ABNORMAL LOW (ref 586–1602)
IgM (Immunoglobulin M), Srm: 26 mg/dL (ref 26–217)
M Protein SerPl Elph-Mcnc: 0.1 g/dL — ABNORMAL HIGH
Total Protein ELP: 5.5 g/dL — ABNORMAL LOW (ref 6.0–8.5)

## 2024-04-01 ENCOUNTER — Ambulatory Visit

## 2024-04-01 ENCOUNTER — Ambulatory Visit: Admitting: Hematology and Oncology

## 2024-04-01 ENCOUNTER — Other Ambulatory Visit

## 2024-04-03 ENCOUNTER — Other Ambulatory Visit: Payer: Self-pay | Admitting: *Deleted

## 2024-04-03 ENCOUNTER — Other Ambulatory Visit: Payer: Self-pay | Admitting: Hematology and Oncology

## 2024-04-03 MED ORDER — LENALIDOMIDE 5 MG PO CAPS: 5.0000 mg | ORAL_CAPSULE | Freq: Every day | ORAL | 0 refills | Status: DC

## 2024-04-08 ENCOUNTER — Ambulatory Visit: Admitting: Hematology and Oncology

## 2024-04-08 ENCOUNTER — Ambulatory Visit

## 2024-04-08 ENCOUNTER — Other Ambulatory Visit

## 2024-04-13 ENCOUNTER — Inpatient Hospital Stay (HOSPITAL_BASED_OUTPATIENT_CLINIC_OR_DEPARTMENT_OTHER): Admitting: Nurse Practitioner

## 2024-04-13 ENCOUNTER — Encounter: Payer: Self-pay | Admitting: Nurse Practitioner

## 2024-04-13 ENCOUNTER — Encounter: Payer: Self-pay | Admitting: Hematology and Oncology

## 2024-04-13 ENCOUNTER — Inpatient Hospital Stay

## 2024-04-13 ENCOUNTER — Inpatient Hospital Stay: Admitting: Hematology and Oncology

## 2024-04-13 ENCOUNTER — Inpatient Hospital Stay: Attending: Hematology and Oncology

## 2024-04-13 VITALS — BP 135/70 | HR 64 | Temp 97.5°F | Resp 17 | Wt 127.4 lb

## 2024-04-13 DIAGNOSIS — C9000 Multiple myeloma not having achieved remission: Secondary | ICD-10-CM

## 2024-04-13 DIAGNOSIS — Z515 Encounter for palliative care: Secondary | ICD-10-CM

## 2024-04-13 DIAGNOSIS — R530 Neoplastic (malignant) related fatigue: Secondary | ICD-10-CM

## 2024-04-13 DIAGNOSIS — Z79899 Other long term (current) drug therapy: Secondary | ICD-10-CM | POA: Diagnosis not present

## 2024-04-13 DIAGNOSIS — Z5112 Encounter for antineoplastic immunotherapy: Secondary | ICD-10-CM | POA: Insufficient documentation

## 2024-04-13 DIAGNOSIS — G893 Neoplasm related pain (acute) (chronic): Secondary | ICD-10-CM

## 2024-04-13 LAB — CBC WITH DIFFERENTIAL (CANCER CENTER ONLY)
Abs Immature Granulocytes: 0.01 K/uL (ref 0.00–0.07)
Basophils Absolute: 0 K/uL (ref 0.0–0.1)
Basophils Relative: 1 %
Eosinophils Absolute: 0 K/uL (ref 0.0–0.5)
Eosinophils Relative: 0 %
HCT: 40.1 % (ref 36.0–46.0)
Hemoglobin: 14.4 g/dL (ref 12.0–15.0)
Immature Granulocytes: 0 %
Lymphocytes Relative: 8 %
Lymphs Abs: 0.4 K/uL — ABNORMAL LOW (ref 0.7–4.0)
MCH: 33.3 pg (ref 26.0–34.0)
MCHC: 35.9 g/dL (ref 30.0–36.0)
MCV: 92.6 fL (ref 80.0–100.0)
Monocytes Absolute: 0.1 K/uL (ref 0.1–1.0)
Monocytes Relative: 1 %
Neutro Abs: 4 K/uL (ref 1.7–7.7)
Neutrophils Relative %: 90 %
Platelet Count: 170 K/uL (ref 150–400)
RBC: 4.33 MIL/uL (ref 3.87–5.11)
RDW: 13 % (ref 11.5–15.5)
WBC Count: 4.5 K/uL (ref 4.0–10.5)
nRBC: 0 % (ref 0.0–0.2)

## 2024-04-13 LAB — CMP (CANCER CENTER ONLY)
ALT: 14 U/L (ref 0–44)
AST: 14 U/L — ABNORMAL LOW (ref 15–41)
Albumin: 4.3 g/dL (ref 3.5–5.0)
Alkaline Phosphatase: 72 U/L (ref 38–126)
Anion gap: 7 (ref 5–15)
BUN: 13 mg/dL (ref 8–23)
CO2: 27 mmol/L (ref 22–32)
Calcium: 8.8 mg/dL — ABNORMAL LOW (ref 8.9–10.3)
Chloride: 108 mmol/L (ref 98–111)
Creatinine: 0.68 mg/dL (ref 0.44–1.00)
GFR, Estimated: >60 mL/min (ref >60)
Glucose, Bld: 229 mg/dL — ABNORMAL HIGH (ref 70–99)
Potassium: 4 mmol/L (ref 3.5–5.1)
Sodium: 142 mmol/L (ref 135–145)
Total Bilirubin: 0.5 mg/dL (ref 0.0–1.2)
Total Protein: 6.2 g/dL — ABNORMAL LOW (ref 6.5–8.1)

## 2024-04-13 MED ORDER — ZOLEDRONIC ACID 4 MG/5ML IV CONC
3.3000 mg | Freq: Once | INTRAVENOUS | Status: AC
  Administered 2024-04-13: 3.3 mg via INTRAVENOUS
  Filled 2024-04-13: qty 4.13

## 2024-04-13 MED ORDER — DARATUMUMAB-HYALURONIDASE-FIHJ 1800-30000 MG-UT/15ML ~~LOC~~ SOLN
1800.0000 mg | Freq: Once | SUBCUTANEOUS | Status: AC
  Administered 2024-04-13: 1800 mg via SUBCUTANEOUS
  Filled 2024-04-13: qty 15

## 2024-04-13 MED ORDER — SODIUM CHLORIDE 0.9 % IV SOLN: INTRAVENOUS | Status: DC

## 2024-04-13 NOTE — Progress Notes (Addendum)
 Okay to reduce Zometa  dose today and in therapy plan to 3.3 mg per Dr. Federico.  Harlene Nasuti, PharmD Oncology Infusion Pharmacist 04/13/2024 3:51 PM

## 2024-04-13 NOTE — Patient Instructions (Addendum)
 [facility] - A DEPT OF [facility]  Discharge Instructions: Thank you for choosing [facility] to provide your oncology and hematology care.   If you have a lab appointment with the Cancer Center, please go directly to the Cancer Center and check in at the registration area.   Wear comfortable clothing and clothing appropriate for easy access to any Portacath or PICC line.   We strive to give you quality time with your provider. You may need to reschedule your appointment if you arrive late (15 or more minutes).  Arriving late affects you and other patients whose appointments are after yours.  Also, if you miss three or more appointments without notifying the office, you may be dismissed from the clinic at the provider's discretion.      For prescription refill requests, have your pharmacy contact our office and allow 72 hours for refills to be completed.    Today you received the following chemotherapy and/or immunotherapy agents: Darzalex  faspro & Zometa     To help prevent nausea and vomiting after your treatment, we encourage you to take your nausea medication as directed.  BELOW ARE SYMPTOMS THAT SHOULD BE REPORTED IMMEDIATELY: *FEVER GREATER THAN 100.4 F (38 C) OR HIGHER *CHILLS OR SWEATING *NAUSEA AND VOMITING THAT IS NOT CONTROLLED WITH YOUR NAUSEA MEDICATION *UNUSUAL SHORTNESS OF BREATH *UNUSUAL BRUISING OR BLEEDING *URINARY PROBLEMS (pain or burning when urinating, or frequent urination) *BOWEL PROBLEMS (unusual diarrhea, constipation, pain near the anus) TENDERNESS IN MOUTH AND THROAT WITH OR WITHOUT PRESENCE OF ULCERS (sore throat, sores in mouth, or a toothache) UNUSUAL RASH, SWELLING OR PAIN  UNUSUAL VAGINAL DISCHARGE OR ITCHING   Items with * indicate a potential emergency and should be followed up as soon as possible or go to the Emergency Department if any problems should occur.  Please show the CHEMOTHERAPY ALERT CARD or  IMMUNOTHERAPY ALERT CARD at check-in to the Emergency Department and triage nurse.  Should you have questions after your visit or need to cancel or reschedule your appointment, please contact [facility] - A DEPT OF [facility]  Dept: [phone]  and follow the prompts.  Office hours are 8:00 a.m. to 4:30 p.m. Monday - Friday. Please note that voicemails left after 4:00 p.m. may not be returned until the following business day.  We are closed weekends and major holidays. You have access to a nurse at all times for urgent questions. Please call the main number to the clinic Dept: [phone] and follow the prompts.   For any non-urgent questions, you may also contact your provider using MyChart. We now offer e-Visits for anyone 24 and older to request care online for non-urgent symptoms. For details visit [email or web address].   Also download the MyChart app! Go to the app store, search MyChart, open the app, select , and log in with your MyChart username and password.   Zoledronic  Acid Injection (Cancer) What is this medication? ZOLEDRONIC  ACID (ZOE le dron ik AS id) treats high calcium  levels in the blood caused by cancer. It may also be used with chemotherapy to treat weakened bones caused by cancer. It works by slowing down the release of calcium  from bones. This lowers calcium  levels in your blood. It also makes your bones stronger and less likely to break (fracture). It belongs to a group of medications called bisphosphonates. This medicine may be used for other purposes; ask your health  care provider or pharmacist if you have questions. COMMON BRAND NAME(S): Zometa , Zometa  Powder What should I tell my care team before I take this medication? They need to know if you have any of these conditions: Dehydration Dental disease Kidney disease Liver disease Low levels of calcium  in the blood Lung or breathing disease, such as asthma Receiving  steroids, such as dexamethasone  or prednisone An unusual or allergic reaction to zoledronic  acid, other medications, foods, dyes, or preservatives Pregnant or trying to get pregnant Breast-feeding How should I use this medication? This medication is injected into a vein. It is given by your care team in a hospital or clinic setting. Talk to your care team about the use of this medication in children. Special care may be needed. Overdosage: If you think you have taken too much of this medicine contact a poison control center or emergency room at once. NOTE: This medicine is only for you. Do not share this medicine with others. What if I miss a dose? Keep appointments for follow-up doses. It is important not to miss your dose. Call your care team if you are unable to keep an appointment. What may interact with this medication? Certain antibiotics given by injection Diuretics, such as bumetanide, furosemide NSAIDs, medications for pain and inflammation, such as ibuprofen or naproxen Teriparatide Thalidomide This list may not describe all possible interactions. Give your health care provider a list of all the medicines, herbs, non-prescription drugs, or dietary supplements you use. Also tell them if you smoke, drink alcohol, or use illegal drugs. Some items may interact with your medicine. What should I watch for while using this medication? Visit your care team for regular checks on your progress. It may be some time before you see the benefit from this medication. Some people who take this medication have severe bone, joint, or muscle pain. This medication may also increase your risk for jaw problems or a broken thigh bone. Tell your care team right away if you have severe pain in your jaw, bones, joints, or muscles. Tell you care team if you have any pain that does not go away or that gets worse. Tell your dentist and dental surgeon that you are taking this medication. You should not have major  dental surgery while on this medication. See your dentist to have a dental exam and fix any dental problems before starting this medication. Take good care of your teeth while on this medication. Make sure you see your dentist for regular follow-up appointments. You should make sure you get enough calcium  and vitamin D  while you are taking this medication. Discuss the foods you eat and the vitamins you take with your care team. Check with your care team if you have severe diarrhea, nausea, and vomiting, or if you sweat a lot. The loss of too much body fluid may make it dangerous for you to take this medication. You may need bloodwork while taking this medication. Talk to your care team if you wish to become pregnant or think you might be pregnant. This medication can cause serious birth defects. What side effects may I notice from receiving this medication? Side effects that you should report to your care team as soon as possible: Allergic reactions--skin rash, itching, hives, swelling of the face, lips, tongue, or throat Kidney injury--decrease in the amount of urine, swelling of the ankles, hands, or feet Low calcium  level--muscle pain or cramps, confusion, tingling, or numbness in the hands or feet Osteonecrosis of the jaw--pain, swelling,  or redness in the mouth, numbness of the jaw, poor healing after dental work, unusual discharge from the mouth, visible bones in the mouth Severe bone, joint, or muscle pain Side effects that usually do not require medical attention (report to your care team if they continue or are bothersome): Constipation Fatigue Fever Loss of appetite Nausea Stomach pain This list may not describe all possible side effects. Call your doctor for medical advice about side effects. You may report side effects to FDA at 1-800-FDA-1088. Where should I keep my medication? This medication is given in a hospital or clinic. It will not be stored at home. NOTE: This sheet is a  summary. It may not cover all possible information. If you have questions about this medicine, talk to your doctor, pharmacist, or health care provider.  2024 Elsevier/Gold Standard (2021-09-15 00:00:00)

## 2024-04-13 NOTE — Progress Notes (Signed)
 Palliative Medicine [facility]  [phone] [phone]   Name: Alice Anthony Date: 04/13/2024 MRN: [id number]  DOB: 1940/10/26  Patient Care Team: Verena Mems, MD as PCP - General (Family Medicine) Pickenpack-Cousar, Fannie SAILOR, NP as Nurse Practitioner (Nurse Practitioner)   INTERVAL HISTORY: Alice Anthony is a 83 y.o. female with oncologic medical history including recent diagnosis of multiple myeloma (12/2022), DM, and posterior vitreous detachment of left and right eyes. Palliative ask to see for symptom management and goals of care   SOCIAL HISTORY:     reports that she has never smoked. She has never used smokeless tobacco. She reports that she does not drink alcohol and does not use drugs.  ADVANCE DIRECTIVES:  Advanced directives on file  CODE STATUS: Full code  PAST MEDICAL HISTORY: Past Medical History:  Diagnosis Date   Anxiety    Arthritis    Asthma    Hx: of   Cancer (HCC)    Hx: of skin cancer   Diverticulosis    GERD (gastroesophageal reflux disease)    Hx: of   History of kidney stones    Hyperlipemia    Hypertension    Pre-diabetes     ALLERGIES:  is allergic to hydrocodone , vioxx [rofecoxib], codeine, doxycycline hyclate, latex, and revlimid  [lenalidomide ].  MEDICATIONS:  Current Outpatient Medications  Medication Sig Dispense Refill   acyclovir  (ZOVIRAX ) 400 MG tablet Take 1 tablet by mouth twice daily 180 tablet 0   ALPRAZolam  (XANAX ) 0.25 MG tablet Take 0.25 mg by mouth 3 (three) times daily as needed.     amLODipine  (NORVASC ) 5 MG tablet Take 5 mg by mouth daily.     aspirin  81 MG tablet Take 81 mg by mouth daily.     cetirizine (ZYRTEC) 10 MG tablet Take 10 mg by mouth daily.     Cholecalciferol (VITAMIN D3) 2000 UNITS capsule Take 2,000 Units by mouth daily.     cycloSPORINE (RESTASIS) 0.05 % ophthalmic emulsion 1 drop 2 (two) times daily.     dexamethasone  (DECADRON ) 4 MG tablet Take 40 mg (10  tablets) by mouth once a week. 40 tablet 3   dextromethorphan-guaiFENesin  (TUSSIN DM) 10-100 MG/5ML liquid Take 10 mLs by mouth every 4 (four) hours as needed for cough.     fluticasone  (FLONASE ) 50 MCG/ACT nasal spray Place 2 sprays into the nose at bedtime.     hydrOXYzine  (ATARAX ) 25 MG tablet Take 25 mg by mouth at bedtime.     Insulin  Regular Human (NOVOLIN R FLEXPEN IJ) Inject 10 Units as directed as needed. 10 units when blood sugar is 200 or above     lenalidomide  (REVLIMID ) 5 MG capsule Take 1 capsule (5 mg total) by mouth daily. [employer] Auth # [id number]   Date Obtained 04/03/24 Take for 21 days then none for 7 days. 21 capsule 0   metoCLOPramide  (REGLAN ) 10 MG tablet Take 1 tablet (10 mg total) by mouth every 8 (eight) hours as needed for nausea. 30 tablet 1   montelukast  (SINGULAIR ) 10 MG tablet Take 10 mg by mouth daily.     Probiotic Product (ALIGN PO) Take by mouth daily.     simvastatin  (ZOCOR ) 20 MG tablet Take 20 mg by mouth every evening.     traMADol  (ULTRAM ) 50 MG tablet TAKE 1 TO 2 TABLETS BY MOUTH EVERY 4 HOURS AS NEEDED 90 tablet 0   No current facility-administered medications for this visit.   Facility-Administered Medications Ordered in  Other Visits  Medication Dose Route Frequency Provider Last Rate Last Admin   daratumumab -hyaluronidase -fihj (DARZALEX  FASPRO) 1800-30000 MG-UT/15ML chemo SQ injection 1,800 mg  1,800 mg Subcutaneous Once Dorsey, John T IV, MD        VITAL SIGNS: There were no vitals taken for this visit. There were no vitals filed for this visit.  Estimated body mass index is 24.07 kg/m as calculated from the following:   Height as of 11/13/23: 5' 1 (1.549 m).   Weight as of an earlier encounter on 04/13/24: 127 lb 6 oz (57.8 kg).  Assessment NAD RRR Normal breathing pattern  AAO x3  PERFORMANCE STATUS (ECOG) : 2 - Symptomatic, <50% confined to bed  Discussed the use of AI scribe software for clinical note transcription with the patient, who  gave verbal consent to proceed.  History of Present Illness Alice Anthony is an 83 year old female who was seen during her infusion for follow-up. She is accompanied by her daughter. No acute distress. Occasional fatigue however despite this she continues to remain active doing things around her home. Denies concerns for nausea, vomiting, constipation, or diarrhea.   Ms. Tipping takes two tramadol  once a day for pain management . Pain is well managed. Does not require around the clock use of Tramadol . No adjustments to regiment.   All symptoms well managed. All questions answered and support provided.   Goals of Care 5/20- We discussed her current illness and what it means in the larger context of her on-going co-morbidities. Natural disease trajectory and expectations were discussed.   Patient and daughter are realistic in their understanding of current illness. Is remaining hopeful for stability and improve quality of life.   We discussed Her current illness and what it means in the larger context of Her on-going co-morbidities. Natural disease trajectory and expectations were discussed.  I discussed the importance of continued conversation with family and their medical providers regarding overall plan of care and treatment options, ensuring decisions are within the context of the patients values and GOCs. Assessment & Plan Pain management with Tramadol  and Tylenol  Chronic pain managed with as needed tramadol .  Generally requiring once a day.  Discussed safe concurrent use and dosing flexibility. - Educated on combined use of Tylenol  Arthritis and Tramadol . - Advised Tramadol  every 4 hours PRN for severe pain. - Encouraged trial of Tylenol  Arthritis. - We will continue to closely monitor and adjust regimen as needed.  Constipation due to medication Constipation likely from medication. Managed with medication balance, but bowel habits vary.  No active medical problems  discussed No active medical problems were discussed. She did not report constipation or diarrhea, and no medication refills were needed.   Follow-up Follow-up in 3-4 weeks.  Sooner if needed.   Patient expressed understanding and was in agreement with this plan. She also understands that She can call the clinic at any time with any questions, concerns, or complaints.   Any controlled substances utilized were prescribed in the context of palliative care. PDMP has been reviewed.   Visit consisted of counseling and education dealing with the complex and emotionally intense issues of symptom management and palliative care in the setting of serious and potentially life-threatening illness.  Levon Borer, AGPCNP-BC  Palliative Medicine Team/[facility]

## 2024-04-13 NOTE — Progress Notes (Unsigned)
 [facility] Telephone:(336) [phone]   Fax:([phone]  PROGRESS NOTE  Patient Care Team: Verena Mems, MD as PCP - General (Family Medicine) Pickenpack-Cousar, Fannie SAILOR, NP as Nurse Practitioner (Nurse Practitioner)  Hematological/Oncological History # Plasmacytoma of Thoracic Spine  # IgG Kappa Multiple myeloma 09/22/2022: Patient underwent MRI thoracic spine which showed acute or subacute T7, T10, and T11 compression fractures 10/10/2022: biopsy of compression fracture of T11 reveals a plasmacytoma 11/13/2022: establish care with Dr. Federico  12/06/2022: bone marrow biopsy showed plasma cell myeloma involving 80% of the marrow.  12/24/2022: Cycle 1 Day 1 of Velcade /Dex 01/14/2023: Cycle 2 Day 1 of Velcade /Dex 02/04/2023: Cycle 3 Day 1 of Velcade /Dex 02/26/2023: Cycle 4 Day 1 of Velcade /Dex HELD due to ocular toxicity 03/04/2023: Cycle 1 Day 1 of Dara/Dex  04/01/2023: Cycle 2 Day 1 of Dara/Dex  04/29/2023: Cycle 3 Day 1 of Dara/Dex  05/27/2023: Cycle 4 Day 1 of Dara/Dex  06/24/2023: Cycle 5 Day 1 of Dara/Dex  07/23/2023: Cycle 6 Day 1 of Dara/Rev/Dex  08/20/2023: Cycle 7 Day 1 of Dara/Rev/Dex  09/18/2023 Cycle 8 Day 1 of Dara/Rev/Dex  10/16/2023: Cycle 9 Day 1 of Dara/Rev/Dex  11/13/2023: Cycle 10 Day 1 of Dara/Rev/Dex  12/11/2023: Cycle 11 Day 1 of Dara/Rev/Dex  01/08/2024: Cycle 12 Day 1 of Dara/Rev/Dex   Interval History:  [name] 83 y.o. female with medical history significant for newly diagnosed IgG kappa multiple myeloma who presents for a follow up visit. The patient's last visit was on 03/11/2024.  In the interim since the last visit she has continued on Dara/Rev/Dex therapy.   On exam today Ms. Jolliff reports she has been well overall in the interim since our last visit.  She reports that she has been taking her Revlimid  5 mg p.o. as scheduled and is currently on her holiday week.  She reports that she takes tramadol  about twice per day.  She notes her pain  is under good control.  She reports her appetite can be off and on.  Otherwise she has had no recent infectious symptoms such as fevers, chills, sweats, nausea, vomiting or diarrhea.  She denies any runny nose, sore throat, cough.  Overall she feels well and is willing and able to continue on treatment at this time.   MEDICAL HISTORY:  Past Medical History:  Diagnosis Date   Anxiety    Arthritis    Asthma    Hx: of   Cancer (HCC)    Hx: of skin cancer   Diverticulosis    GERD (gastroesophageal reflux disease)    Hx: of   History of kidney stones    Hyperlipemia    Hypertension    Pre-diabetes     SURGICAL HISTORY: Past Surgical History:  Procedure Laterality Date   ABDOMINAL SURGERY  09/19/2012   Ruptured Diverticuli, Ostomy Placed.   BREAST BIOPSY  06/06/2012   breast biopsy   CATARACT EXTRACTION W/ INTRAOCULAR LENS IMPLANT Bilateral    COLONOSCOPY  03/06/2012   COLOSTOMY CLOSURE  12/25/2012   Dr Vanderbilt   COLOSTOMY CLOSURE N/A 12/25/2012   Procedure: COLOSTOMY CLOSURE;  Surgeon: [name], MD;  Location: [facility] OR;  Service: General;  Laterality: N/A;   EXTRACORPOREAL SHOCK WAVE LITHOTRIPSY Left 11/17/2018   Procedure: EXTRACORPOREAL SHOCK WAVE LITHOTRIPSY (ESWL);  Surgeon: Sherrilee Belvie CROME, MD;  Location: [facility] ORS;  Service: Urology;  Laterality: Left;   EYE SURGERY  10/19/1998   Hx: of cyst removed from left eye   KYPHOPLASTY  N/A 10/10/2022   Procedure: KYPHOPLASTY Thoracic seven , Thoracic ten,Thoracic eleven;  Surgeon: [name], MD;  Location: [facility] OR;  Service: Neurosurgery;  Laterality: N/A;   LAPAROTOMY N/A 09/19/2012   Procedure: EXPLORATORY LAPAROTOMY, sigmoid colectomy;  Surgeon: [name], MD;  Location: [facility] ORS;  Service: General;  Laterality: N/A;   SIGMOIDOSCOPY  01/11/1999   TONSILLECTOMY     TRIGGER FINGER RELEASE  12/25/2010   TUBAL LIGATION  10/16/1985    SOCIAL HISTORY: Social History   Socioeconomic History   Marital status:  Widowed    Spouse name: Not on file   Number of children: Not on file   Years of education: Not on file   Highest education level: Not on file  Occupational History   Not on file  Tobacco Use   Smoking status: Never   Smokeless tobacco: Never  Vaping Use   Vaping status: Never Used  Substance and Sexual Activity   Alcohol use: No   Drug use: No   Sexual activity: Not Currently  Other Topics Concern   Not on file  Social History Narrative   Not on file   Social Drivers of Health   Financial Resource Strain: Not on file  Food Insecurity: Not on file  Transportation Needs: Not on file  Physical Activity: Not on file  Stress: Not on file  Social Connections: Not on file  Intimate Partner Violence: Not on file    FAMILY HISTORY: Family History  Problem Relation Age of Onset   Diabetes Father    Alzheimer's disease Father    Basal cell carcinoma Mother        Metastatic    ALLERGIES:  is allergic to hydrocodone , vioxx [rofecoxib], codeine, doxycycline hyclate, latex, and revlimid  [lenalidomide ].  MEDICATIONS:  Current Outpatient Medications  Medication Sig Dispense Refill   acyclovir  (ZOVIRAX ) 400 MG tablet Take 1 tablet by mouth twice daily 180 tablet 0   ALPRAZolam  (XANAX ) 0.25 MG tablet Take 0.25 mg by mouth 3 (three) times daily as needed.     amLODipine  (NORVASC ) 5 MG tablet Take 5 mg by mouth daily.     aspirin  81 MG tablet Take 81 mg by mouth daily.     cetirizine (ZYRTEC) 10 MG tablet Take 10 mg by mouth daily.     Cholecalciferol (VITAMIN D3) 2000 UNITS capsule Take 2,000 Units by mouth daily.     cycloSPORINE (RESTASIS) 0.05 % ophthalmic emulsion 1 drop 2 (two) times daily.     dexamethasone  (DECADRON ) 4 MG tablet Take 40 mg (10 tablets) by mouth once a week. 40 tablet 3   dextromethorphan-guaiFENesin  (TUSSIN DM) 10-100 MG/5ML liquid Take 10 mLs by mouth every 4 (four) hours as needed for cough.     fluticasone  (FLONASE ) 50 MCG/ACT nasal spray Place 2 sprays  into the nose at bedtime.     hydrOXYzine  (ATARAX ) 25 MG tablet Take 25 mg by mouth at bedtime.     Insulin  Regular Human (NOVOLIN R FLEXPEN IJ) Inject 10 Units as directed as needed. 10 units when blood sugar is 200 or above     lenalidomide  (REVLIMID ) 5 MG capsule Take 1 capsule (5 mg total) by mouth daily. [employer] Auth # [id number]   Date Obtained 04/03/24 Take for 21 days then none for 7 days. 21 capsule 0   metoCLOPramide  (REGLAN ) 10 MG tablet Take 1 tablet (10 mg total) by mouth every 8 (eight) hours as needed for nausea. 30 tablet 1   montelukast  (SINGULAIR ) 10 MG  tablet Take 10 mg by mouth daily.     Probiotic Product (ALIGN PO) Take by mouth daily.     simvastatin  (ZOCOR ) 20 MG tablet Take 20 mg by mouth every evening.     traMADol  (ULTRAM ) 50 MG tablet TAKE 1 TO 2 TABLETS BY MOUTH EVERY 4 HOURS AS NEEDED 90 tablet 0   No current facility-administered medications for this visit.   Facility-Administered Medications Ordered in Other Visits  Medication Dose Route Frequency Provider Last Rate Last Admin   daratumumab -hyaluronidase -fihj (DARZALEX  FASPRO) 1800-30000 MG-UT/15ML chemo SQ injection 1,800 mg  1,800 mg Subcutaneous Once Federico Norleen ONEIDA MADISON, MD        REVIEW OF SYSTEMS:   Constitutional: ( - ) fevers, ( - )  chills , ( - ) night sweats Eyes: ( - ) blurriness of vision, ( - ) double vision, ( - ) watery eyes Ears, nose, mouth, throat, and face: ( - ) mucositis, ( - ) sore throat Respiratory: ( - ) cough, ( - ) dyspnea, ( - ) wheezes Cardiovascular: ( - ) palpitation, ( - ) chest discomfort, ( - ) lower extremity swelling Gastrointestinal:  ( - ) nausea, ( - ) heartburn, ( - ) change in bowel habits Skin: ( - ) abnormal skin rashes Lymphatics: ( - ) new lymphadenopathy, ( - ) easy bruising Neurological: ( - ) numbness, ( - ) tingling, ( - ) new weaknesses Behavioral/Psych: ( - ) mood change, ( - ) new changes  All other systems were reviewed with the patient and are  negative.  PHYSICAL EXAMINATION: ECOG PERFORMANCE STATUS: 1 - Symptomatic but completely ambulatory  There were no vitals filed for this visit.  There were no vitals filed for this visit.   GENERAL: Well-appearing elderly Caucasian female, alert, no distress and comfortable SKIN: skin color, texture, turgor are normal, no rashes or significant lesions EYES: conjunctiva are pink and non-injected, sclera clear.  LUNGS: clear to auscultation and percussion with normal breathing effort HEART: regular rate & rhythm and no murmurs and no lower extremity edema Musculoskeletal: no cyanosis of digits and no clubbing  PSYCH: alert & oriented x 3, fluent speech NEURO: no focal motor/sensory deficits  LABORATORY DATA:  I have reviewed the data as listed    Latest Ref Rng & Units 04/13/2024    1:56 PM 03/11/2024   11:36 AM 02/05/2024   10:47 AM  CBC  WBC 4.0 - 10.5 K/uL 4.5  5.0  5.0   Hemoglobin 12.0 - 15.0 g/dL 85.5  85.6  85.6   Hematocrit 36.0 - 46.0 % 40.1  40.1  40.7   Platelets 150 - 400 K/uL 170  123  171        Latest Ref Rng & Units 04/13/2024    1:56 PM 03/11/2024   11:36 AM 02/05/2024   10:47 AM  CMP  Glucose 70 - 99 mg/dL 770  815  842   BUN 8 - 23 mg/dL 13  12  13    Creatinine 0.44 - 1.00 mg/dL 9.31  9.31  9.26   Sodium 135 - 145 mmol/L 142  141  142   Potassium 3.5 - 5.1 mmol/L 4.0  4.1  3.9   Chloride 98 - 111 mmol/L 108  108  107   CO2 22 - 32 mmol/L 27  27  29    Calcium  8.9 - 10.3 mg/dL 8.8  8.7  9.3   Total Protein 6.5 - 8.1 g/dL 6.2  5.9  6.2  Total Bilirubin 0.0 - 1.2 mg/dL 0.5  0.6  0.6   Alkaline Phos 38 - 126 U/L 72  53  52   AST 15 - 41 U/L 14  17  17    ALT 0 - 44 U/L 14  18  18      Lab Results  Component Value Date   MPROTEIN 0.1 (H) 03/11/2024   MPROTEIN Not Observed 02/05/2024   MPROTEIN 0.1 (H) 01/08/2024   Lab Results  Component Value Date   KPAFRELGTCHN 33.6 (H) 03/11/2024   KPAFRELGTCHN 51.3 (H) 02/05/2024   KPAFRELGTCHN 45.0 (H) 01/08/2024    LAMBDASER 3.7 (L) 03/11/2024   LAMBDASER 3.7 (L) 02/05/2024   LAMBDASER 2.9 (L) 01/08/2024   KAPLAMBRATIO 9.08 (H) 03/11/2024   KAPLAMBRATIO 13.86 (H) 02/05/2024   KAPLAMBRATIO 15.52 (H) 01/08/2024   Lab Results  Component Value Date   MPROTEIN 0.1 (H) 03/11/2024   MPROTEIN Not Observed 02/05/2024   MPROTEIN 0.1 (H) 01/08/2024   Lab Results  Component Value Date   KPAFRELGTCHN 33.6 (H) 03/11/2024   KPAFRELGTCHN 51.3 (H) 02/05/2024   KPAFRELGTCHN 45.0 (H) 01/08/2024   LAMBDASER 3.7 (L) 03/11/2024   LAMBDASER 3.7 (L) 02/05/2024   LAMBDASER 2.9 (L) 01/08/2024   KAPLAMBRATIO 9.08 (H) 03/11/2024   KAPLAMBRATIO 13.86 (H) 02/05/2024   KAPLAMBRATIO 15.52 (H) 01/08/2024     RADIOGRAPHIC STUDIES: No results found.  ASSESSMENT & PLAN CHINYERE GALIANO is a 83 y.o.  female with medical history significant for IgG kappa multiple myeloma who presents for a follow up visit.   # IgG Kappa Multiple Myeloma # Plasmacytoma of Spine  -- Patient diagnosed with plasmacytoma of the spine, subsequent bone marrow biopsy confirmed an IgG kappa multiple myeloma involving the bone marrow --start of Velcade /Dex Cycle 1 Day 1 on 12/24/2022. --Due to ocular toxicity, recommended to discontinue Velcade  therapy and switch to Daratumumab .  --Started Revlimid  on 07/17/2023 at 10 mg PO daily. Dose reduced to 5 mg PO daily on 10/30/2023 due to rash.  Plan: --Due for Cycle 15, Day 1 of Dara/Rev/Dex --Tolerating lower dose of Revlimid  without any new toxicities. (5 mg PO daily x 21 days with 7 days off) --Labs today show WBC 4.5, Hgb 14.4, MCV 92.6, Plt 170.  Creatinine and LFTs are normal.  --Last M protein 0.1 on 03/11/2024 with kappa free light chains at 32.4 with a ratio of 12.96 -- Return to clinic in 4 weeks with continued Dara/Rev/Dex.    #Ocular toxicity/Stye --Secondary to Velcade  therapy --Used Moxifloxacin  drops with improvement  #Supportive Care -- chemotherapy education complete -- port placement  not required   -- zofran  8mg  q8H PRN and compazine  10mg  PO q6H for nausea -- acyclovir  400mg  PO BID for VCZ prophylaxis -- Recieved dental clearance prior to the start of Xgeva/Zometa . First dose on 01/07/2023. Last dose on 01/08/2024. Next dose in September 2025 -- Tramadol  50 mg every 6 hours as needed for pain control.  Currently taking 2 daily.  No orders of the defined types were placed in this encounter.  All questions were answered. The patient knows to call the clinic with any problems, questions or concerns.  I have spent a total of 30 minutes minutes of face-to-face and non-face-to-face time, preparing to see the patient, performing a medically appropriate examination, counseling and educating the patient,  documenting clinical information in the electronic health record,and care coordination.   Norleen IVAR Kidney, MD Department of Hematology/Oncology [facility] Phone: [phone] Pager: [phone]  Email: [email or web address]   04/13/2024 3:31 PM

## 2024-04-13 NOTE — Progress Notes (Signed)
 Pt states she has already taken singulair , tylenol , and benadryl  premeds at home prior to coming to infusion.

## 2024-04-14 LAB — KAPPA/LAMBDA LIGHT CHAINS
Kappa free light chain: 32.4 mg/L — ABNORMAL HIGH (ref 3.3–19.4)
Kappa, lambda light chain ratio: 12.96 — ABNORMAL HIGH (ref 0.26–1.65)
Lambda free light chains: 2.5 mg/L — ABNORMAL LOW (ref 5.7–26.3)

## 2024-04-16 LAB — MULTIPLE MYELOMA PANEL, SERUM
Albumin SerPl Elph-Mcnc: 3.6 g/dL (ref 2.9–4.4)
Albumin/Glob SerPl: 1.6 (ref 0.7–1.7)
Alpha 1: 0.3 g/dL (ref 0.0–0.4)
Alpha2 Glob SerPl Elph-Mcnc: 0.8 g/dL (ref 0.4–1.0)
B-Globulin SerPl Elph-Mcnc: 0.9 g/dL (ref 0.7–1.3)
Gamma Glob SerPl Elph-Mcnc: 0.3 g/dL — ABNORMAL LOW (ref 0.4–1.8)
Globulin, Total: 2.3 g/dL (ref 2.2–3.9)
IgA: 28 mg/dL — ABNORMAL LOW (ref 64–422)
IgG (Immunoglobin G), Serum: 329 mg/dL — ABNORMAL LOW (ref 586–1602)
IgM (Immunoglobulin M), Srm: 21 mg/dL — ABNORMAL LOW (ref 26–217)
M Protein SerPl Elph-Mcnc: 0.1 g/dL — ABNORMAL HIGH
Total Protein ELP: 5.9 g/dL — ABNORMAL LOW (ref 6.0–8.5)

## 2024-04-19 ENCOUNTER — Encounter: Payer: Self-pay | Admitting: Hematology and Oncology

## 2024-04-22 ENCOUNTER — Other Ambulatory Visit: Payer: Self-pay | Admitting: Nurse Practitioner

## 2024-04-22 DIAGNOSIS — G893 Neoplasm related pain (acute) (chronic): Secondary | ICD-10-CM

## 2024-04-22 DIAGNOSIS — Z515 Encounter for palliative care: Secondary | ICD-10-CM

## 2024-04-22 DIAGNOSIS — C9000 Multiple myeloma not having achieved remission: Secondary | ICD-10-CM

## 2024-05-04 ENCOUNTER — Other Ambulatory Visit: Payer: Self-pay | Admitting: Hematology and Oncology

## 2024-05-04 ENCOUNTER — Other Ambulatory Visit: Payer: Self-pay | Admitting: *Deleted

## 2024-05-04 MED ORDER — LENALIDOMIDE 5 MG PO CAPS: 5.0000 mg | ORAL_CAPSULE | Freq: Every day | ORAL | 0 refills | Status: DC

## 2024-05-13 ENCOUNTER — Inpatient Hospital Stay (HOSPITAL_BASED_OUTPATIENT_CLINIC_OR_DEPARTMENT_OTHER): Admitting: Nurse Practitioner

## 2024-05-13 ENCOUNTER — Inpatient Hospital Stay: Attending: Hematology and Oncology

## 2024-05-13 ENCOUNTER — Inpatient Hospital Stay

## 2024-05-13 ENCOUNTER — Inpatient Hospital Stay (HOSPITAL_BASED_OUTPATIENT_CLINIC_OR_DEPARTMENT_OTHER): Admitting: Physician Assistant

## 2024-05-13 ENCOUNTER — Encounter: Payer: Self-pay | Admitting: Nurse Practitioner

## 2024-05-13 VITALS — BP 156/76 | HR 76 | Temp 97.7°F | Resp 18 | Wt 128.5 lb

## 2024-05-13 DIAGNOSIS — Z5112 Encounter for antineoplastic immunotherapy: Secondary | ICD-10-CM | POA: Diagnosis not present

## 2024-05-13 DIAGNOSIS — C9000 Multiple myeloma not having achieved remission: Secondary | ICD-10-CM | POA: Diagnosis not present

## 2024-05-13 DIAGNOSIS — R530 Neoplastic (malignant) related fatigue: Secondary | ICD-10-CM

## 2024-05-13 DIAGNOSIS — G893 Neoplasm related pain (acute) (chronic): Secondary | ICD-10-CM | POA: Diagnosis not present

## 2024-05-13 DIAGNOSIS — Z515 Encounter for palliative care: Secondary | ICD-10-CM

## 2024-05-13 DIAGNOSIS — Z79899 Other long term (current) drug therapy: Secondary | ICD-10-CM | POA: Diagnosis not present

## 2024-05-13 LAB — CMP (CANCER CENTER ONLY)
ALT: 16 U/L (ref 0–44)
AST: 17 U/L (ref 15–41)
Albumin: 4.3 g/dL (ref 3.5–5.0)
Alkaline Phosphatase: 68 U/L (ref 38–126)
Anion gap: 5 (ref 5–15)
BUN: 13 mg/dL (ref 8–23)
CO2: 28 mmol/L (ref 22–32)
Calcium: 9.4 mg/dL (ref 8.9–10.3)
Chloride: 108 mmol/L (ref 98–111)
Creatinine: 0.73 mg/dL (ref 0.44–1.00)
GFR, Estimated: >60 mL/min (ref >60)
Glucose, Bld: 198 mg/dL — ABNORMAL HIGH (ref 70–99)
Potassium: 4.2 mmol/L (ref 3.5–5.1)
Sodium: 141 mmol/L (ref 135–145)
Total Bilirubin: 0.6 mg/dL (ref 0.0–1.2)
Total Protein: 6.4 g/dL — ABNORMAL LOW (ref 6.5–8.1)

## 2024-05-13 LAB — CBC WITH DIFFERENTIAL (CANCER CENTER ONLY)
Abs Immature Granulocytes: 0 K/uL (ref 0.00–0.07)
Basophils Absolute: 0.1 K/uL (ref 0.0–0.1)
Basophils Relative: 1 %
Eosinophils Absolute: 0.2 K/uL (ref 0.0–0.5)
Eosinophils Relative: 4 %
HCT: 41.8 % (ref 36.0–46.0)
Hemoglobin: 14.7 g/dL (ref 12.0–15.0)
Immature Granulocytes: 0 %
Lymphocytes Relative: 19 %
Lymphs Abs: 0.9 K/uL (ref 0.7–4.0)
MCH: 33 pg (ref 26.0–34.0)
MCHC: 35.2 g/dL (ref 30.0–36.0)
MCV: 93.7 fL (ref 80.0–100.0)
Monocytes Absolute: 0.3 K/uL (ref 0.1–1.0)
Monocytes Relative: 7 %
Neutro Abs: 3.2 K/uL (ref 1.7–7.7)
Neutrophils Relative %: 69 %
Platelet Count: 165 K/uL (ref 150–400)
RBC: 4.46 MIL/uL (ref 3.87–5.11)
RDW: 13.2 % (ref 11.5–15.5)
WBC Count: 4.6 K/uL (ref 4.0–10.5)
nRBC: 0 % (ref 0.0–0.2)

## 2024-05-13 MED ORDER — DARATUMUMAB-HYALURONIDASE-FIHJ 1800-30000 MG-UT/15ML ~~LOC~~ SOLN
1800.0000 mg | Freq: Once | SUBCUTANEOUS | Status: AC
  Administered 2024-05-13: 1800 mg via SUBCUTANEOUS
  Filled 2024-05-13: qty 15

## 2024-05-13 NOTE — Patient Instructions (Signed)

## 2024-05-13 NOTE — Progress Notes (Signed)
 Pt states she took all premeds, including steroids, prior to arrival.

## 2024-05-13 NOTE — Progress Notes (Signed)
 [facility] Telephone:(336) [phone]   Fax:(336) [phone]  PROGRESS NOTE  Patient Care Team: Verena Mems, MD as PCP - General (Family Medicine) Pickenpack-Cousar, Fannie SAILOR, NP as Nurse Practitioner (Nurse Practitioner)  Hematological/Oncological History # Plasmacytoma of Thoracic Spine  # IgG Kappa Multiple myeloma 09/22/2022: Patient underwent MRI thoracic spine which showed acute or subacute T7, T10, and T11 compression fractures 10/10/2022: biopsy of compression fracture of T11 reveals a plasmacytoma 11/13/2022: establish care with Dr. Federico  12/06/2022: bone marrow biopsy showed plasma cell myeloma involving 80% of the marrow.  12/24/2022: Cycle 1 Day 1 of Velcade /Dex 01/14/2023: Cycle 2 Day 1 of Velcade /Dex 02/04/2023: Cycle 3 Day 1 of Velcade /Dex 02/26/2023: Cycle 4 Day 1 of Velcade /Dex HELD due to ocular toxicity 03/04/2023: Cycle 1 Day 1 of Dara/Dex  04/01/2023: Cycle 2 Day 1 of Dara/Dex  04/29/2023: Cycle 3 Day 1 of Dara/Dex  05/27/2023: Cycle 4 Day 1 of Dara/Dex  06/24/2023: Cycle 5 Day 1 of Dara/Dex  07/23/2023: Cycle 6 Day 1 of Dara/Rev/Dex  08/20/2023: Cycle 7 Day 1 of Dara/Rev/Dex  09/18/2023 Cycle 8 Day 1 of Dara/Rev/Dex  10/16/2023: Cycle 9 Day 1 of Dara/Rev/Dex  11/13/2023: Cycle 10 Day 1 of Dara/Rev/Dex  12/11/2023: Cycle 11 Day 1 of Dara/Rev/Dex  01/08/2024: Cycle 12 Day 1 of Dara/Rev/Dex  02/05/2024: Cycle 13 Day 1 of Dara/Rev/Dex  03/11/2024: Cycle 14 Day 1 of Dara/Rev/Dex  04/13/2024: Cycle 15 Day 1 of Dara/Rev/Dex  05/13/2024: Cycle 16 Day 1 of Dara/Rev/Dex   Interval History:  Alice Anthony 83 y.o. female with medical history significant for newly diagnosed IgG kappa multiple myeloma who presents for a follow up visit. The patient's last visit was on 04/13/2024.  In the interim since the last visit she has continued on Dara/Rev/Dex therapy.   On exam today Alice Anthony reports she has been well overall in the interim since our last visit.  She has  noticed more anxiety lately with no known trigger.  She adds that there has been some construction in her neighborhood which has caused excessive noise.  She leans on her family including her daughters for additional support.   She reports her energy and appetite are unchanged.  She denies nausea, vomiting or abdominal pain.  She does have intermittent episodes of diarrhea mainly after her Zometa  infusion.  She has chronic low back pain with her current pain medication that improves severity of the pain currently.  She denies fevers, chills, night sweats, shortness of breath, chest pain or cough.  She has no other complaints.  Rest of the 10 point ROS as below.   Overall she feels well and is willing and able to continue on treatment at this time.   MEDICAL HISTORY:  Past Medical History:  Diagnosis Date   Anxiety    Arthritis    Asthma    Hx: of   Cancer (HCC)    Hx: of skin cancer   Diverticulosis    GERD (gastroesophageal reflux disease)    Hx: of   History of kidney stones    Hyperlipemia    Hypertension    Pre-diabetes     SURGICAL HISTORY: Past Surgical History:  Procedure Laterality Date   ABDOMINAL SURGERY  09/19/2012   Ruptured Diverticuli, Ostomy Placed.   BREAST BIOPSY  06/06/2012   breast biopsy   CATARACT EXTRACTION W/ INTRAOCULAR LENS IMPLANT Bilateral    COLONOSCOPY  03/06/2012   COLOSTOMY CLOSURE  12/25/2012   Dr Vanderbilt   COLOSTOMY CLOSURE  N/A 12/25/2012   Procedure: COLOSTOMY CLOSURE;  Surgeon: [name], MD;  Location: [facility] OR;  Service: General;  Laterality: N/A;   EXTRACORPOREAL SHOCK WAVE LITHOTRIPSY Left 11/17/2018   Procedure: EXTRACORPOREAL SHOCK WAVE LITHOTRIPSY (ESWL);  Surgeon: Sherrilee Belvie CROME, MD;  Location: [facility] ORS;  Service: Urology;  Laterality: Left;   EYE SURGERY  10/19/1998   Hx: of cyst removed from left eye   KYPHOPLASTY N/A 10/10/2022   Procedure: KYPHOPLASTY Thoracic seven , Thoracic ten,Thoracic eleven;  Surgeon: [name], MD;  Location: [facility] OR;  Service: Neurosurgery;  Laterality: N/A;   LAPAROTOMY N/A 09/19/2012   Procedure: EXPLORATORY LAPAROTOMY, sigmoid colectomy;  Surgeon: [name], MD;  Location: [facility] ORS;  Service: General;  Laterality: N/A;   SIGMOIDOSCOPY  01/11/1999   TONSILLECTOMY     TRIGGER FINGER RELEASE  12/25/2010   TUBAL LIGATION  10/16/1985    SOCIAL HISTORY: Social History   Socioeconomic History   Marital status: Widowed    Spouse name: Not on file   Number of children: Not on file   Years of education: Not on file   Highest education level: Not on file  Occupational History   Not on file  Tobacco Use   Smoking status: Never   Smokeless tobacco: Never  Vaping Use   Vaping status: Never Used  Substance and Sexual Activity   Alcohol use: No   Drug use: No   Sexual activity: Not Currently  Other Topics Concern   Not on file  Social History Narrative   Not on file   Social Drivers of Health   Financial Resource Strain: Not on file  Food Insecurity: Not on file  Transportation Needs: Not on file  Physical Activity: Not on file  Stress: Not on file  Social Connections: Not on file  Intimate Partner Violence: Not on file    FAMILY HISTORY: Family History  Problem Relation Age of Onset   Diabetes Father    Alzheimer's disease Father    Basal cell carcinoma Mother        Metastatic    ALLERGIES:  is allergic to hydrocodone , vioxx [rofecoxib], codeine, doxycycline hyclate, latex, and revlimid  [lenalidomide ].  MEDICATIONS:  Current Outpatient Medications  Medication Sig Dispense Refill   acyclovir  (ZOVIRAX ) 400 MG tablet Take 1 tablet by mouth twice daily 180 tablet 0   ALPRAZolam  (XANAX ) 0.25 MG tablet Take 0.25 mg by mouth 3 (three) times daily as needed.     amLODipine  (NORVASC ) 5 MG tablet Take 5 mg by mouth daily.     aspirin  81 MG tablet Take 81 mg by mouth daily.     cetirizine (ZYRTEC) 10 MG tablet Take 10 mg by mouth daily.      Cholecalciferol (VITAMIN D3) 2000 UNITS capsule Take 2,000 Units by mouth daily.     cycloSPORINE (RESTASIS) 0.05 % ophthalmic emulsion 1 drop 2 (two) times daily.     dexamethasone  (DECADRON ) 4 MG tablet Take 40 mg (10 tablets) by mouth once a week. 40 tablet 3   dextromethorphan-guaiFENesin  (TUSSIN DM) 10-100 MG/5ML liquid Take 10 mLs by mouth every 4 (four) hours as needed for cough.     fluticasone  (FLONASE ) 50 MCG/ACT nasal spray Place 2 sprays into the nose at bedtime.     hydrOXYzine  (ATARAX ) 25 MG tablet Take 25 mg by mouth at bedtime.     Insulin  Regular Human (NOVOLIN R FLEXPEN IJ) Inject 10 Units as directed as needed. 10 units when blood sugar is 200  or above     lenalidomide  (REVLIMID ) 5 MG capsule Take 1 capsule (5 mg total) by mouth daily. Bertrum Barrows # [id number]   Date Obtained 05/04/24 Take for 21 days then none for 7 days. 21 capsule 0   metoCLOPramide  (REGLAN ) 10 MG tablet Take 1 tablet (10 mg total) by mouth every 8 (eight) hours as needed for nausea. 30 tablet 1   montelukast  (SINGULAIR ) 10 MG tablet Take 10 mg by mouth daily.     Probiotic Product (ALIGN PO) Take by mouth daily.     simvastatin  (ZOCOR ) 20 MG tablet Take 20 mg by mouth every evening.     traMADol  (ULTRAM ) 50 MG tablet TAKE 1 TO 2 TABLETS BY MOUTH EVERY 4 HOURS AS NEEDED 90 tablet 0   No current facility-administered medications for this visit.    REVIEW OF SYSTEMS:   Constitutional: ( - ) fevers, ( - )  chills , ( - ) night sweats Eyes: ( - ) blurriness of vision, ( - ) double vision, ( - ) watery eyes Ears, nose, mouth, throat, and face: ( - ) mucositis, ( - ) sore throat Respiratory: ( - ) cough, ( - ) dyspnea, ( - ) wheezes Cardiovascular: ( - ) palpitation, ( - ) chest discomfort, ( - ) lower extremity swelling Gastrointestinal:  ( - ) nausea, ( - ) heartburn, ( - ) change in bowel habits Skin: ( - ) abnormal skin rashes Lymphatics: ( - ) new lymphadenopathy, ( - ) easy bruising Neurological: ( - )  numbness, ( - ) tingling, ( - ) new weaknesses Behavioral/Psych: ( - ) mood change, ( - ) new changes  All other systems were reviewed with the patient and are negative.  PHYSICAL EXAMINATION: ECOG PERFORMANCE STATUS: 1 - Symptomatic but completely ambulatory  Vitals:   05/13/24 1154  BP: (!) 156/76  Pulse: 76  Resp: 18  Temp: 97.7 F (36.5 C)  SpO2: 100%    Filed Weights   05/13/24 1154  Weight: 128 lb 8 oz (58.3 kg)     GENERAL: Well-appearing elderly Caucasian female, alert, no distress and comfortable SKIN: skin color, texture, turgor are normal, no rashes or significant lesions EYES: conjunctiva are pink and non-injected, sclera clear.  LUNGS: clear to auscultation and percussion with normal breathing effort HEART: regular rate & rhythm and no murmurs and no lower extremity edema Musculoskeletal: no cyanosis of digits and no clubbing  PSYCH: alert & oriented x 3, fluent speech NEURO: no focal motor/sensory deficits  LABORATORY DATA:  I have reviewed the data as listed    Latest Ref Rng & Units 05/13/2024   11:10 AM 04/13/2024    1:56 PM 03/11/2024   11:36 AM  CBC  WBC 4.0 - 10.5 K/uL 4.6  4.5  5.0   Hemoglobin 12.0 - 15.0 g/dL 85.2  85.5  85.6   Hematocrit 36.0 - 46.0 % 41.8  40.1  40.1   Platelets 150 - 400 K/uL 165  170  123        Latest Ref Rng & Units 04/13/2024    1:56 PM 03/11/2024   11:36 AM 02/05/2024   10:47 AM  CMP  Glucose 70 - 99 mg/dL 770  815  842   BUN 8 - 23 mg/dL 13  12  13    Creatinine 0.44 - 1.00 mg/dL 9.31  9.31  9.26   Sodium 135 - 145 mmol/L 142  141  142   Potassium 3.5 - 5.1  mmol/L 4.0  4.1  3.9   Chloride 98 - 111 mmol/L 108  108  107   CO2 22 - 32 mmol/L 27  27  29    Calcium  8.9 - 10.3 mg/dL 8.8  8.7  9.3   Total Protein 6.5 - 8.1 g/dL 6.2  5.9  6.2   Total Bilirubin 0.0 - 1.2 mg/dL 0.5  0.6  0.6   Alkaline Phos 38 - 126 U/L 72  53  52   AST 15 - 41 U/L 14  17  17    ALT 0 - 44 U/L 14  18  18      Lab Results  Component Value Date    MPROTEIN 0.1 (H) 04/13/2024   MPROTEIN 0.1 (H) 03/11/2024   MPROTEIN Not Observed 02/05/2024   Lab Results  Component Value Date   KPAFRELGTCHN 32.4 (H) 04/13/2024   KPAFRELGTCHN 33.6 (H) 03/11/2024   KPAFRELGTCHN 51.3 (H) 02/05/2024   LAMBDASER 2.5 (L) 04/13/2024   LAMBDASER 3.7 (L) 03/11/2024   LAMBDASER 3.7 (L) 02/05/2024   KAPLAMBRATIO 12.96 (H) 04/13/2024   KAPLAMBRATIO 9.08 (H) 03/11/2024   KAPLAMBRATIO 13.86 (H) 02/05/2024   Lab Results  Component Value Date   MPROTEIN 0.1 (H) 04/13/2024   MPROTEIN 0.1 (H) 03/11/2024   MPROTEIN Not Observed 02/05/2024   Lab Results  Component Value Date   KPAFRELGTCHN 32.4 (H) 04/13/2024   KPAFRELGTCHN 33.6 (H) 03/11/2024   KPAFRELGTCHN 51.3 (H) 02/05/2024   LAMBDASER 2.5 (L) 04/13/2024   LAMBDASER 3.7 (L) 03/11/2024   LAMBDASER 3.7 (L) 02/05/2024   KAPLAMBRATIO 12.96 (H) 04/13/2024   KAPLAMBRATIO 9.08 (H) 03/11/2024   KAPLAMBRATIO 13.86 (H) 02/05/2024     RADIOGRAPHIC STUDIES: No results found.  ASSESSMENT & PLAN Alice Anthony is a 83 y.o.  female with medical history significant for IgG kappa multiple myeloma who presents for a follow up visit.   # IgG Kappa Multiple Myeloma # Plasmacytoma of Spine  -- Patient diagnosed with plasmacytoma of the spine, subsequent bone marrow biopsy confirmed an IgG kappa multiple myeloma involving the bone marrow --start of Velcade /Dex Cycle 1 Day 1 on 12/24/2022. --Due to ocular toxicity, recommended to discontinue Velcade  therapy and switch to Daratumumab .  --Started Revlimid  on 07/17/2023 at 10 mg PO daily. Dose reduced to 5 mg PO daily on 10/30/2023 due to rash.  Plan: --Due for Cycle 16, Day 1 of Dara/Rev/Dex --Tolerating lower dose of Revlimid  without any new toxicities. (5 mg PO daily x 21 days with 7 days off) --Labs today show WBC 4.6, Hgb 14.7, MCV 93.7, Plt 165..  Creatinine and LFTs are normal.  --Last M protein 0.1 on 04/13/2024 with kappa free light chains at 32.4 with a ratio  of 12.96 --Proceed with treatment without any dose modifications. -- Return to clinic in 4 weeks with continued Dara/Rev/Dex.    #Ocular toxicity/Stye --Secondary to Velcade  therapy --Used Moxifloxacin  drops with improvement  #Supportive Care -- chemotherapy education complete -- port placement not required   -- zofran  8mg  q8H PRN and compazine  10mg  PO q6H for nausea -- acyclovir  400mg  PO BID for VCZ prophylaxis -- Recieved dental clearance prior to the start of Xgeva/Zometa . First dose on 01/07/2023. Last dose on 04/13/2024. Next dose in December 2025 -- Tramadol  50 mg every 6 hours as needed for pain control.   No orders of the defined types were placed in this encounter.  All questions were answered. The patient knows to call the clinic with any problems, questions or concerns.  I  have spent a total of 30 minutes minutes of face-to-face and non-face-to-face time, preparing to see the patient, performing a medically appropriate examination, counseling and educating the patient,  documenting clinical information in the electronic health record,and care coordination.   Johnston Police PA-C Dept of Hematology and Oncology [facility] Phone: [phone]   05/13/2024 12:13 PM

## 2024-05-13 NOTE — Progress Notes (Signed)
 Palliative Medicine [facility]  Telephone:(336) [phone] Fax:(336) [phone]   Name: Alice Anthony Date: 05/13/2024 MRN: [id number]  DOB: 09-25-1940  Patient Care Team: Verena Mems, MD as PCP - General (Family Medicine) Pickenpack-Cousar, Fannie SAILOR, NP as Nurse Practitioner (Nurse Practitioner)   INTERVAL HISTORY: Alice Anthony is a 83 y.o. female with oncologic medical history including recent diagnosis of multiple myeloma (12/2022), DM, and posterior vitreous detachment of left and right eyes. Palliative ask to see for symptom management and goals of care   SOCIAL HISTORY:     reports that she has never smoked. She has never used smokeless tobacco. She reports that she does not drink alcohol and does not use drugs.  ADVANCE DIRECTIVES:  Advanced directives on file  CODE STATUS: Full code  PAST MEDICAL HISTORY: Past Medical History:  Diagnosis Date   Anxiety    Arthritis    Asthma    Hx: of   Cancer (HCC)    Hx: of skin cancer   Diverticulosis    GERD (gastroesophageal reflux disease)    Hx: of   History of kidney stones    Hyperlipemia    Hypertension    Pre-diabetes     ALLERGIES:  is allergic to hydrocodone , vioxx [rofecoxib], codeine, doxycycline hyclate, latex, and revlimid  [lenalidomide ].  MEDICATIONS:  Current Outpatient Medications  Medication Sig Dispense Refill   acyclovir  (ZOVIRAX ) 400 MG tablet Take 1 tablet by mouth twice daily 180 tablet 0   ALPRAZolam  (XANAX ) 0.25 MG tablet Take 0.25 mg by mouth 3 (three) times daily as needed.     amLODipine  (NORVASC ) 5 MG tablet Take 5 mg by mouth daily.     aspirin  81 MG tablet Take 81 mg by mouth daily.     cetirizine (ZYRTEC) 10 MG tablet Take 10 mg by mouth daily.     Cholecalciferol (VITAMIN D3) 2000 UNITS capsule Take 2,000 Units by mouth daily.     cycloSPORINE (RESTASIS) 0.05 % ophthalmic emulsion 1 drop 2 (two) times daily.     dexamethasone  (DECADRON ) 4 MG tablet Take 40 mg (10  tablets) by mouth once a week. 40 tablet 3   dextromethorphan-guaiFENesin  (TUSSIN DM) 10-100 MG/5ML liquid Take 10 mLs by mouth every 4 (four) hours as needed for cough.     fluticasone  (FLONASE ) 50 MCG/ACT nasal spray Place 2 sprays into the nose at bedtime.     hydrOXYzine  (ATARAX ) 25 MG tablet Take 25 mg by mouth at bedtime.     Insulin  Regular Human (NOVOLIN R FLEXPEN IJ) Inject 10 Units as directed as needed. 10 units when blood sugar is 200 or above     lenalidomide  (REVLIMID ) 5 MG capsule Take 1 capsule (5 mg total) by mouth daily. Bertrum Barrows # [id number]   Date Obtained 05/04/24 Take for 21 days then none for 7 days. 21 capsule 0   metoCLOPramide  (REGLAN ) 10 MG tablet Take 1 tablet (10 mg total) by mouth every 8 (eight) hours as needed for nausea. 30 tablet 1   montelukast  (SINGULAIR ) 10 MG tablet Take 10 mg by mouth daily.     Probiotic Product (ALIGN PO) Take by mouth daily.     simvastatin  (ZOCOR ) 20 MG tablet Take 20 mg by mouth every evening.     traMADol  (ULTRAM ) 50 MG tablet TAKE 1 TO 2 TABLETS BY MOUTH EVERY 4 HOURS AS NEEDED 90 tablet 0   No current facility-administered medications for this visit.   Facility-Administered Medications Ordered in  Other Visits  Medication Dose Route Frequency Provider Last Rate Last Admin   daratumumab -hyaluronidase -fihj (DARZALEX  FASPRO) 1800-30000 MG-UT/15ML chemo SQ injection 1,800 mg  1,800 mg Subcutaneous Once Dorsey, John T IV, MD        VITAL SIGNS: There were no vitals taken for this visit. There were no vitals filed for this visit.  Estimated body mass index is 24.28 kg/m as calculated from the following:   Height as of 11/13/23: 5' 1 (1.549 m).   Weight as of an earlier encounter on 05/13/24: 128 lb 8 oz (58.3 kg).  Assessment NAD RRR Normal breathing pattern  AAO x3  PERFORMANCE STATUS (ECOG) : 2 - Symptomatic, <50% confined to bed  Discussed the use of AI scribe software for clinical note transcription with the patient, who  gave verbal consent to proceed.  History of Present Illness Alice Anthony is an 83 year old female who was seen during her infusion for follow-up. She is accompanied by her daughter. No acute distress. Occasional fatigue however despite this she continues to remain active doing things around her home. Denies concerns for nausea, vomiting, constipation, or diarrhea. Appetite is good. She maintains a stable weight of 127 pounds for the past year consistently. Today she surprisingly 128lbs on the scales today.    Ms. Wiesen takes 1-2 tramadol  once a day for pain management ass needed. She experiences ongoing back pain daily, which radiates down her legs. Reports questionable need to follow-up with orthopedic however she is currently trying to manage her current health challenges and unsure if she is ready to consider other additions to this. Her daughter and I discussed pain management and listening to her body. Patient is aware she may take additional Tramadol  as needed however somewhat reluctant. Education provided on use of one tablet pending level of pain intensity.   We will continue to closely monitor and support.  All questions answered and support provided.  Goals of Care 5/20- We discussed her current illness and what it means in the larger context of her on-going co-morbidities. Natural disease trajectory and expectations were discussed.   Patient and daughter are realistic in their understanding of current illness. Is remaining hopeful for stability and improve quality of life.   We discussed Her current illness and what it means in the larger context of Her on-going co-morbidities. Natural disease trajectory and expectations were discussed.  I discussed the importance of continued conversation with family and their medical providers regarding overall plan of care and treatment options, ensuring decisions are within the context of the patients values and GOCs. Assessment &  Plan Pain management with Tramadol  and Tylenol  Chronic pain managed with as needed tramadol .  Generally requiring once a day.  Discussed safe concurrent use and dosing flexibility. - Educated on combined use of Tylenol  Arthritis and Tramadol . - Advised Tramadol  every 4 hours PRN for severe pain. - Encouraged trial of Tylenol  Arthritis. - We will continue to closely monitor and adjust regimen as needed. - Continue current pain management regimen. - Encourage as-needed medication use for effective pain control. - Monitor pain levels and adjust medication as necessary.  Constipation due to medication Constipation likely from medication. Managed with medication balance, but bowel habits vary.  No active medical problems discussed No active medical problems were discussed. She did not report constipation or diarrhea, and no medication refills were needed.   Follow-up Follow-up in 3-4 weeks.  Sooner if needed.   Patient expressed understanding and was in agreement  with this plan. She also understands that She can call the clinic at any time with any questions, concerns, or complaints.   Any controlled substances utilized were prescribed in the context of palliative care. PDMP has been reviewed.   Visit consisted of counseling and education dealing with the complex and emotionally intense issues of symptom management and palliative care in the setting of serious and potentially life-threatening illness.  Levon Borer, AGPCNP-BC  Palliative Medicine Team/[facility]

## 2024-05-14 LAB — KAPPA/LAMBDA LIGHT CHAINS
Kappa free light chain: 27.8 mg/L — ABNORMAL HIGH (ref 3.3–19.4)
Kappa, lambda light chain ratio: 7.13 — ABNORMAL HIGH (ref 0.26–1.65)
Lambda free light chains: 3.9 mg/L — ABNORMAL LOW (ref 5.7–26.3)

## 2024-05-18 LAB — MULTIPLE MYELOMA PANEL, SERUM
Albumin SerPl Elph-Mcnc: 3.7 g/dL (ref 2.9–4.4)
Albumin/Glob SerPl: 1.8 — ABNORMAL HIGH (ref 0.7–1.7)
Alpha 1: 0.2 g/dL (ref 0.0–0.4)
Alpha2 Glob SerPl Elph-Mcnc: 0.7 g/dL (ref 0.4–1.0)
B-Globulin SerPl Elph-Mcnc: 0.8 g/dL (ref 0.7–1.3)
Gamma Glob SerPl Elph-Mcnc: 0.3 g/dL — ABNORMAL LOW (ref 0.4–1.8)
Globulin, Total: 2.1 g/dL — ABNORMAL LOW (ref 2.2–3.9)
IgA: 24 mg/dL — ABNORMAL LOW (ref 64–422)
IgG (Immunoglobin G), Serum: 322 mg/dL — ABNORMAL LOW (ref 586–1602)
IgM (Immunoglobulin M), Srm: 20 mg/dL — ABNORMAL LOW (ref 26–217)
M Protein SerPl Elph-Mcnc: 0.1 g/dL — ABNORMAL HIGH
Total Protein ELP: 5.8 g/dL — ABNORMAL LOW (ref 6.0–8.5)

## 2024-05-21 ENCOUNTER — Telehealth: Payer: Self-pay | Admitting: *Deleted

## 2024-05-21 ENCOUNTER — Telehealth: Payer: Self-pay | Admitting: Hematology and Oncology

## 2024-05-21 NOTE — Telephone Encounter (Signed)
 Received call from pt's daughter, [name].  She states her mother is having trouble with her eyes again. She states her mom wakes up with sty-like area on her eyelids that she can express pus from. Advised that she needs to see her eye doctor as soon asd possible. Also advised that I would let Dr. Federico know of this situation.

## 2024-05-22 DIAGNOSIS — H0288A Meibomian gland dysfunction right eye, upper and lower eyelids: Secondary | ICD-10-CM | POA: Diagnosis not present

## 2024-06-01 ENCOUNTER — Other Ambulatory Visit: Payer: Self-pay | Admitting: *Deleted

## 2024-06-01 ENCOUNTER — Other Ambulatory Visit: Payer: Self-pay | Admitting: Hematology and Oncology

## 2024-06-01 DIAGNOSIS — R7303 Prediabetes: Secondary | ICD-10-CM | POA: Diagnosis not present

## 2024-06-01 DIAGNOSIS — F411 Generalized anxiety disorder: Secondary | ICD-10-CM | POA: Diagnosis not present

## 2024-06-01 DIAGNOSIS — C9000 Multiple myeloma not having achieved remission: Secondary | ICD-10-CM | POA: Diagnosis not present

## 2024-06-01 DIAGNOSIS — Z0000 Encounter for general adult medical examination without abnormal findings: Secondary | ICD-10-CM | POA: Diagnosis not present

## 2024-06-01 DIAGNOSIS — Z13220 Encounter for screening for lipoid disorders: Secondary | ICD-10-CM | POA: Diagnosis not present

## 2024-06-01 MED ORDER — LENALIDOMIDE 5 MG PO CAPS: 5.0000 mg | ORAL_CAPSULE | Freq: Every day | ORAL | 0 refills | Status: DC

## 2024-06-05 ENCOUNTER — Other Ambulatory Visit: Payer: Self-pay | Admitting: Nurse Practitioner

## 2024-06-05 DIAGNOSIS — C9000 Multiple myeloma not having achieved remission: Secondary | ICD-10-CM

## 2024-06-05 DIAGNOSIS — Z515 Encounter for palliative care: Secondary | ICD-10-CM

## 2024-06-05 DIAGNOSIS — G893 Neoplasm related pain (acute) (chronic): Secondary | ICD-10-CM

## 2024-06-05 MED ORDER — TRAMADOL HCL 50 MG PO TABS: 50.0000 mg | ORAL_TABLET | ORAL | 0 refills | Status: DC | PRN

## 2024-06-08 NOTE — Progress Notes (Signed)
 [facility] [phone]   [phone]  PROGRESS NOTE  Patient Care Team: Verena Mems, MD as PCP - General (Family Medicine) Pickenpack-Cousar, Fannie SAILOR, NP as Nurse Practitioner (Nurse Practitioner)  Hematological/Oncological History # Plasmacytoma of Thoracic Spine  # IgG Kappa Multiple myeloma 09/22/2022: Patient underwent MRI thoracic spine which showed acute or subacute T7, T10, and T11 compression fractures 10/10/2022: biopsy of compression fracture of T11 reveals a plasmacytoma 11/13/2022: establish care with Dr. Federico  12/06/2022: bone marrow biopsy showed plasma cell myeloma involving 80% of the marrow.  12/24/2022: Cycle 1 Day 1 of Velcade /Dex 01/14/2023: Cycle 2 Day 1 of Velcade /Dex 02/04/2023: Cycle 3 Day 1 of Velcade /Dex 02/26/2023: Cycle 4 Day 1 of Velcade /Dex HELD due to ocular toxicity 03/04/2023: Cycle 1 Day 1 of Dara/Dex  04/01/2023: Cycle 2 Day 1 of Dara/Dex  04/29/2023: Cycle 3 Day 1 of Dara/Dex  05/27/2023: Cycle 4 Day 1 of Dara/Dex  06/24/2023: Cycle 5 Day 1 of Dara/Dex  07/23/2023: Cycle 6 Day 1 of Dara/Rev/Dex  08/20/2023: Cycle 7 Day 1 of Dara/Rev/Dex  09/18/2023 Cycle 8 Day 1 of Dara/Rev/Dex  10/16/2023: Cycle 9 Day 1 of Dara/Rev/Dex  11/13/2023: Cycle 10 Day 1 of Dara/Rev/Dex  12/11/2023: Cycle 11 Day 1 of Dara/Rev/Dex  01/08/2024: Cycle 12 Day 1 of Dara/Rev/Dex   Interval History:  Alice Anthony 83 y.o. female with medical history significant for newly diagnosed IgG kappa multiple myeloma who presents for a follow up visit. The patient's last visit was on 05/13/2024.  In the interim since the last visit she has continued on Dara/Rev/Dex therapy.   On exam today Alice Anthony reports she has had no major side effects as a result of her treatment.  She does not have any numbness or tingling of her fingers and toes.  She reports her appetite remains good and she is quite hungry.  She does occasionally stub her toes but reports that  she is otherwise quite well.  She is enjoying the cooler weather and has had no issues with fevers, chills, sweats.  She is looking forward towards getting the flu shot.  She reports that she has always gotten the flu shot every season.  She notes that she otherwise has been at her baseline level of health with no fevers, chills, sweats, nausea, vomiting or diarrhea.  She denies any runny nose, sore throat, cough.  She is willing and able to proceed with her chemotherapy treatment at this time.   MEDICAL HISTORY:  Past Medical History:  Diagnosis Date   Anxiety    Arthritis    Asthma    Hx: of   Cancer (HCC)    Hx: of skin cancer   Diverticulosis    GERD (gastroesophageal reflux disease)    Hx: of   History of kidney stones    Hyperlipemia    Hypertension    Pre-diabetes     SURGICAL HISTORY: Past Surgical History:  Procedure Laterality Date   ABDOMINAL SURGERY  09/19/2012   Ruptured Diverticuli, Ostomy Placed.   BREAST BIOPSY  06/06/2012   breast biopsy   CATARACT EXTRACTION W/ INTRAOCULAR LENS IMPLANT Bilateral    COLONOSCOPY  03/06/2012   COLOSTOMY CLOSURE  12/25/2012   Dr Vanderbilt   COLOSTOMY CLOSURE N/A 12/25/2012   Procedure: COLOSTOMY CLOSURE;  Surgeon: [name], MD;  Location: [facility] OR;  Service: General;  Laterality: N/A;   EXTRACORPOREAL SHOCK WAVE LITHOTRIPSY Left 11/17/2018   Procedure: EXTRACORPOREAL SHOCK WAVE LITHOTRIPSY (ESWL);  Surgeon:  McKenzie, Belvie CROME, MD;  Location: [facility] ORS;  Service: Urology;  Laterality: Left;   EYE SURGERY  10/19/1998   Hx: of cyst removed from left eye   KYPHOPLASTY N/A 10/10/2022   Procedure: KYPHOPLASTY Thoracic seven , Thoracic ten,Thoracic eleven;  Surgeon: [name], MD;  Location: [facility] OR;  Service: Neurosurgery;  Laterality: N/A;   LAPAROTOMY N/A 09/19/2012   Procedure: EXPLORATORY LAPAROTOMY, sigmoid colectomy;  Surgeon: [name], MD;  Location: [facility]S;  Service: General;  Laterality: N/A;   SIGMOIDOSCOPY   01/11/1999   TONSILLECTOMY     TRIGGER FINGER RELEASE  12/25/2010   TUBAL LIGATION  10/16/1985    SOCIAL HISTORY: Social History   Socioeconomic History   Marital status: Widowed    Spouse name: Not on file   Number of children: Not on file   Years of education: Not on file   Highest education level: Not on file  Occupational History   Not on file  Tobacco Use   Smoking status: Never   Smokeless tobacco: Never  Vaping Use   Vaping status: Never Used  Substance and Sexual Activity   Alcohol use: No   Drug use: No   Sexual activity: Not Currently  Other Topics Concern   Not on file  Social History Narrative   Not on file   Social Drivers of Health   Financial Resource Strain: Not on file  Food Insecurity: Not on file  Transportation Needs: Not on file  Physical Activity: Not on file  Stress: Not on file  Social Connections: Not on file  Intimate Partner Violence: Not on file    FAMILY HISTORY: Family History  Problem Relation Age of Onset   Diabetes Father    Alzheimer's disease Father    Basal cell carcinoma Mother        Metastatic    ALLERGIES:  is allergic to hydrocodone , vioxx [rofecoxib], codeine, doxycycline hyclate, latex, and revlimid  [lenalidomide ].  MEDICATIONS:  Current Outpatient Medications  Medication Sig Dispense Refill   acyclovir  (ZOVIRAX ) 400 MG tablet Take 1 tablet by mouth twice daily 180 tablet 0   ALPRAZolam  (XANAX ) 0.25 MG tablet Take 0.25 mg by mouth 3 (three) times daily as needed.     amLODipine  (NORVASC ) 5 MG tablet Take 5 mg by mouth daily.     aspirin  81 MG tablet Take 81 mg by mouth daily.     cetirizine (ZYRTEC) 10 MG tablet Take 10 mg by mouth daily.     Cholecalciferol (VITAMIN D3) 2000 UNITS capsule Take 2,000 Units by mouth daily.     cycloSPORINE (RESTASIS) 0.05 % ophthalmic emulsion 1 drop 2 (two) times daily.     dexamethasone  (DECADRON ) 4 MG tablet Take 40 mg (10 tablets) by mouth once a week. 40 tablet 3    dextromethorphan-guaiFENesin  (TUSSIN DM) 10-100 MG/5ML liquid Take 10 mLs by mouth every 4 (four) hours as needed for cough.     fluticasone  (FLONASE ) 50 MCG/ACT nasal spray Place 2 sprays into the nose at bedtime.     hydrOXYzine  (ATARAX ) 25 MG tablet Take 25 mg by mouth at bedtime.     Insulin  Regular Human (NOVOLIN R FLEXPEN IJ) Inject 10 Units as directed as needed. 10 units when blood sugar is 200 or above     lenalidomide  (REVLIMID ) 5 MG capsule Take 1 capsule (5 mg total) by mouth daily. [employer] Auth # [id number]   Date Obtained 06/01/24 Take for 21 days then none for 7 days. 21 capsule 0  metoCLOPramide  (REGLAN ) 10 MG tablet Take 1 tablet (10 mg total) by mouth every 8 (eight) hours as needed for nausea. 30 tablet 1   montelukast  (SINGULAIR ) 10 MG tablet Take 10 mg by mouth daily.     Probiotic Product (ALIGN PO) Take by mouth daily.     simvastatin  (ZOCOR ) 20 MG tablet Take 20 mg by mouth every evening.     traMADol  (ULTRAM ) 50 MG tablet Take 1-2 tablets (50-100 mg total) by mouth every 4 (four) hours as needed. 90 tablet 0   No current facility-administered medications for this visit.    REVIEW OF SYSTEMS:   Constitutional: ( - ) fevers, ( - )  chills , ( - ) night sweats Eyes: ( - ) blurriness of vision, ( - ) double vision, ( - ) watery eyes Ears, nose, mouth, throat, and face: ( - ) mucositis, ( - ) sore throat Respiratory: ( - ) cough, ( - ) dyspnea, ( - ) wheezes Cardiovascular: ( - ) palpitation, ( - ) chest discomfort, ( - ) lower extremity swelling Gastrointestinal:  ( - ) nausea, ( - ) heartburn, ( - ) change in bowel habits Skin: ( - ) abnormal skin rashes Lymphatics: ( - ) new lymphadenopathy, ( - ) easy bruising Neurological: ( - ) numbness, ( - ) tingling, ( - ) new weaknesses Behavioral/Psych: ( - ) mood change, ( - ) new changes  All other systems were reviewed with the patient and are negative.  PHYSICAL EXAMINATION: ECOG PERFORMANCE STATUS: 1 - Symptomatic but  completely ambulatory  There were no vitals filed for this visit.  There were no vitals filed for this visit.   GENERAL: Well-appearing elderly Caucasian female, alert, no distress and comfortable SKIN: skin color, texture, turgor are normal, no rashes or significant lesions EYES: conjunctiva are pink and non-injected, sclera clear.  LUNGS: clear to auscultation and percussion with normal breathing effort HEART: regular rate & rhythm and no murmurs and no lower extremity edema Musculoskeletal: no cyanosis of digits and no clubbing  PSYCH: alert & oriented x 3, fluent speech NEURO: no focal motor/sensory deficits  LABORATORY DATA:  I have reviewed the data as listed    Latest Ref Rng & Units 06/09/2024   10:50 AM 05/13/2024   11:10 AM 04/13/2024    1:56 PM  CBC  WBC 4.0 - 10.5 K/uL 4.4  4.6  4.5   Hemoglobin 12.0 - 15.0 g/dL 84.7  85.2  85.5   Hematocrit 36.0 - 46.0 % 43.4  41.8  40.1   Platelets 150 - 400 K/uL 154  165  170        Latest Ref Rng & Units 06/09/2024   10:50 AM 05/13/2024   11:10 AM 04/13/2024    1:56 PM  CMP  Glucose 70 - 99 mg/dL 863  801  770   BUN 8 - 23 mg/dL 14  13  13    Creatinine 0.44 - 1.00 mg/dL 9.19  9.26  9.31   Sodium 135 - 145 mmol/L 141  141  142   Potassium 3.5 - 5.1 mmol/L 4.2  4.2  4.0   Chloride 98 - 111 mmol/L 106  108  108   CO2 22 - 32 mmol/L 28  28  27    Calcium  8.9 - 10.3 mg/dL 9.2  9.4  8.8   Total Protein 6.5 - 8.1 g/dL 6.7  6.4  6.2   Total Bilirubin 0.0 - 1.2 mg/dL 0.6  0.6  0.5   Alkaline Phos 38 - 126 U/L 56  68  72   AST 15 - 41 U/L 18  17  14    ALT 0 - 44 U/L 17  16  14      Lab Results  Component Value Date   MPROTEIN 0.1 (H) 05/13/2024   MPROTEIN 0.1 (H) 04/13/2024   MPROTEIN 0.1 (H) 03/11/2024   Lab Results  Component Value Date   KPAFRELGTCHN 27.8 (H) 05/13/2024   KPAFRELGTCHN 32.4 (H) 04/13/2024   KPAFRELGTCHN 33.6 (H) 03/11/2024   LAMBDASER 3.9 (L) 05/13/2024   LAMBDASER 2.5 (L) 04/13/2024   LAMBDASER 3.7 (L)  03/11/2024   KAPLAMBRATIO 7.13 (H) 05/13/2024   KAPLAMBRATIO 12.96 (H) 04/13/2024   KAPLAMBRATIO 9.08 (H) 03/11/2024   Lab Results  Component Value Date   MPROTEIN 0.1 (H) 05/13/2024   MPROTEIN 0.1 (H) 04/13/2024   MPROTEIN 0.1 (H) 03/11/2024   Lab Results  Component Value Date   KPAFRELGTCHN 27.8 (H) 05/13/2024   KPAFRELGTCHN 32.4 (H) 04/13/2024   KPAFRELGTCHN 33.6 (H) 03/11/2024   LAMBDASER 3.9 (L) 05/13/2024   LAMBDASER 2.5 (L) 04/13/2024   LAMBDASER 3.7 (L) 03/11/2024   KAPLAMBRATIO 7.13 (H) 05/13/2024   KAPLAMBRATIO 12.96 (H) 04/13/2024   KAPLAMBRATIO 9.08 (H) 03/11/2024     RADIOGRAPHIC STUDIES: No results found.  ASSESSMENT & PLAN Alice Anthony is a 83 y.o.  female with medical history significant for IgG kappa multiple myeloma who presents for a follow up visit.   # IgG Kappa Multiple Myeloma # Plasmacytoma of Spine  -- Patient diagnosed with plasmacytoma of the spine, subsequent bone marrow biopsy confirmed an IgG kappa multiple myeloma involving the bone marrow --start of Velcade /Dex Cycle 1 Day 1 on 12/24/2022. --Due to ocular toxicity, recommended to discontinue Velcade  therapy and switch to Daratumumab .  --Started Revlimid  on 07/17/2023 at 10 mg PO daily. Dose reduced to 5 mg PO daily on 10/30/2023 due to rash.  Plan: --Due for Cycle 15, Day 1 of Dara/Rev/Dex --Tolerating lower dose of Revlimid  without any new toxicities. (5 mg PO daily x 21 days with 7 days off) --Labs today show WBC 4.4, Hgb 15.2, MCV 94.8, Plt 154.  Creatinine and LFTs are normal.  --Last M protein 0.1 on 05/13/2024 with kappa 27.8, lambda 3.9, ratio 7.13  -- Return to clinic in 4 weeks with continued Dara/Rev/Dex.    #Ocular toxicity/Stye --Secondary to Velcade  therapy --Used Moxifloxacin  drops with improvement  #Supportive Care -- chemotherapy education complete -- port placement not required   -- zofran  8mg  q8H PRN and compazine  10mg  PO q6H for nausea -- acyclovir  400mg  PO BID for  VCZ prophylaxis -- Recieved dental clearance prior to the start of Zometa . First dose on 01/07/2023. Last dose on 04/13/2024. Next dose in Dec 2025.  -- Tramadol  50 mg every 6 hours as needed for pain control.  Currently taking 2 daily.  No orders of the defined types were placed in this encounter.  All questions were answered. The patient knows to call the clinic with any problems, questions or concerns.  I have spent a total of 30 minutes minutes of face-to-face and non-face-to-face time, preparing to see the patient, performing a medically appropriate examination, counseling and educating the patient,  documenting clinical information in the electronic health record,and care coordination.   Norleen IVAR Kidney, MD Department of Hematology/Oncology [facility] Phone: [phone] Pager: [phone] Email: [email or web address]   06/09/2024 12:20 PM

## 2024-06-09 ENCOUNTER — Inpatient Hospital Stay: Admitting: Nurse Practitioner

## 2024-06-09 ENCOUNTER — Other Ambulatory Visit: Payer: Self-pay | Admitting: *Deleted

## 2024-06-09 ENCOUNTER — Inpatient Hospital Stay

## 2024-06-09 ENCOUNTER — Inpatient Hospital Stay: Admitting: Hematology and Oncology

## 2024-06-09 ENCOUNTER — Encounter: Payer: Self-pay | Admitting: Nurse Practitioner

## 2024-06-09 ENCOUNTER — Inpatient Hospital Stay: Attending: Hematology and Oncology

## 2024-06-09 ENCOUNTER — Other Ambulatory Visit: Payer: Self-pay

## 2024-06-09 VITALS — BP 168/81 | HR 75 | Temp 97.5°F | Resp 16

## 2024-06-09 DIAGNOSIS — C9000 Multiple myeloma not having achieved remission: Secondary | ICD-10-CM | POA: Insufficient documentation

## 2024-06-09 DIAGNOSIS — E099 Drug or chemical induced diabetes mellitus without complications: Secondary | ICD-10-CM

## 2024-06-09 DIAGNOSIS — R530 Neoplastic (malignant) related fatigue: Secondary | ICD-10-CM

## 2024-06-09 DIAGNOSIS — Z515 Encounter for palliative care: Secondary | ICD-10-CM | POA: Diagnosis not present

## 2024-06-09 DIAGNOSIS — R7303 Prediabetes: Secondary | ICD-10-CM

## 2024-06-09 DIAGNOSIS — G893 Neoplasm related pain (acute) (chronic): Secondary | ICD-10-CM | POA: Diagnosis not present

## 2024-06-09 DIAGNOSIS — Z79620 Long term (current) use of immunosuppressive biologic: Secondary | ICD-10-CM | POA: Insufficient documentation

## 2024-06-09 DIAGNOSIS — K5903 Drug induced constipation: Secondary | ICD-10-CM | POA: Diagnosis not present

## 2024-06-09 DIAGNOSIS — Z5112 Encounter for antineoplastic immunotherapy: Secondary | ICD-10-CM | POA: Diagnosis present

## 2024-06-09 LAB — CMP (CANCER CENTER ONLY)
ALT: 17 U/L (ref 0–44)
AST: 18 U/L (ref 15–41)
Albumin: 4.6 g/dL (ref 3.5–5.0)
Alkaline Phosphatase: 56 U/L (ref 38–126)
Anion gap: 7 (ref 5–15)
BUN: 14 mg/dL (ref 8–23)
CO2: 28 mmol/L (ref 22–32)
Calcium: 9.2 mg/dL (ref 8.9–10.3)
Chloride: 106 mmol/L (ref 98–111)
Creatinine: 0.8 mg/dL (ref 0.44–1.00)
GFR, Estimated: >60 mL/min (ref >60)
Glucose, Bld: 136 mg/dL — ABNORMAL HIGH (ref 70–99)
Potassium: 4.2 mmol/L (ref 3.5–5.1)
Sodium: 141 mmol/L (ref 135–145)
Total Bilirubin: 0.6 mg/dL (ref 0.0–1.2)
Total Protein: 6.7 g/dL (ref 6.5–8.1)

## 2024-06-09 LAB — CBC WITH DIFFERENTIAL (CANCER CENTER ONLY)
Abs Immature Granulocytes: 0.02 K/uL (ref 0.00–0.07)
Basophils Absolute: 0.1 K/uL (ref 0.0–0.1)
Basophils Relative: 1 %
Eosinophils Absolute: 0 K/uL (ref 0.0–0.5)
Eosinophils Relative: 1 %
HCT: 43.4 % (ref 36.0–46.0)
Hemoglobin: 15.2 g/dL — ABNORMAL HIGH (ref 12.0–15.0)
Immature Granulocytes: 1 %
Lymphocytes Relative: 13 %
Lymphs Abs: 0.6 K/uL — ABNORMAL LOW (ref 0.7–4.0)
MCH: 33.2 pg (ref 26.0–34.0)
MCHC: 35 g/dL (ref 30.0–36.0)
MCV: 94.8 fL (ref 80.0–100.0)
Monocytes Absolute: 0.2 K/uL (ref 0.1–1.0)
Monocytes Relative: 4 %
Neutro Abs: 3.5 K/uL (ref 1.7–7.7)
Neutrophils Relative %: 80 %
Platelet Count: 154 K/uL (ref 150–400)
RBC: 4.58 MIL/uL (ref 3.87–5.11)
RDW: 13 % (ref 11.5–15.5)
WBC Count: 4.4 K/uL (ref 4.0–10.5)
nRBC: 0 % (ref 0.0–0.2)

## 2024-06-09 LAB — HEMOGLOBIN A1C
Hgb A1c MFr Bld: 5.8 % — ABNORMAL HIGH (ref 4.8–5.6)
Mean Plasma Glucose: 119.76 mg/dL

## 2024-06-09 LAB — TSH: TSH: 1.11 u[IU]/mL (ref 0.350–4.500)

## 2024-06-09 MED ORDER — DARATUMUMAB-HYALURONIDASE-FIHJ 1800-30000 MG-UT/15ML ~~LOC~~ SOLN
1800.0000 mg | Freq: Once | SUBCUTANEOUS | Status: AC
  Administered 2024-06-09: 1800 mg via SUBCUTANEOUS
  Filled 2024-06-09: qty 15

## 2024-06-09 NOTE — Progress Notes (Signed)
 Patient took PO steroids and all pre-medications at home today. No need to release from tx plan.

## 2024-06-09 NOTE — Patient Instructions (Signed)

## 2024-06-09 NOTE — Progress Notes (Signed)
 Bernardino Potters, RN reports pt did see her eye doctor for sxs of stye (see 05/21/24 phone note).  She did not get any abx eye gtts.  She continues on Restasis & is managing w/ warm compresses.  She has ongoing itching.  Willies Laviolette, Pharm.D., CPP 06/09/2024@12 :01 PM

## 2024-06-09 NOTE — Progress Notes (Signed)
 Palliative Medicine [facility]  Telephone:([phone] [phone]) [phone]   Name: Alice Anthony Date: 06/09/2024 MRN: [id number]  DOB: 02-02-1941  Patient Care Team: Verena Mems, MD as PCP - General (Family Medicine) Pickenpack-Cousar, Fannie SAILOR, NP as Nurse Practitioner (Nurse Practitioner)   INTERVAL HISTORY: Alice Anthony is a 83 y.o. female with oncologic medical history including recent diagnosis of multiple myeloma (12/2022), DM, and posterior vitreous detachment of left and right eyes. Palliative ask to see for symptom management and goals of care   SOCIAL HISTORY:     reports that she has never smoked. She has never used smokeless tobacco. She reports that she does not drink alcohol and does not use drugs.  ADVANCE DIRECTIVES:  Advanced directives on file  CODE STATUS: Full code  PAST MEDICAL HISTORY: Past Medical History:  Diagnosis Date   Anxiety    Arthritis    Asthma    Hx: of   Cancer (HCC)    Hx: of skin cancer   Diverticulosis    GERD (gastroesophageal reflux disease)    Hx: of   History of kidney stones    Hyperlipemia    Hypertension    Pre-diabetes     ALLERGIES:  is allergic to hydrocodone , vioxx [rofecoxib], codeine, doxycycline hyclate, latex, and revlimid  [lenalidomide ].  MEDICATIONS:  Current Outpatient Medications  Medication Sig Dispense Refill   acyclovir  (ZOVIRAX ) 400 MG tablet Take 1 tablet by mouth twice daily 180 tablet 0   ALPRAZolam  (XANAX ) 0.25 MG tablet Take 0.25 mg by mouth 3 (three) times daily as needed.     amLODipine  (NORVASC ) 5 MG tablet Take 5 mg by mouth daily.     aspirin  81 MG tablet Take 81 mg by mouth daily.     cetirizine (ZYRTEC) 10 MG tablet Take 10 mg by mouth daily.     Cholecalciferol (VITAMIN D3) 2000 UNITS capsule Take 2,000 Units by mouth daily.     cycloSPORINE (RESTASIS) 0.05 % ophthalmic emulsion 1 drop 2 (two) times daily.     dexamethasone  (DECADRON ) 4 MG tablet Take 40 mg (10  tablets) by mouth once a week. 40 tablet 3   dextromethorphan-guaiFENesin  (TUSSIN DM) 10-100 MG/5ML liquid Take 10 mLs by mouth every 4 (four) hours as needed for cough.     fluticasone  (FLONASE ) 50 MCG/ACT nasal spray Place 2 sprays into the nose at bedtime.     hydrOXYzine  (ATARAX ) 25 MG tablet Take 25 mg by mouth at bedtime.     Insulin  Regular Human (NOVOLIN R FLEXPEN IJ) Inject 10 Units as directed as needed. 10 units when blood sugar is 200 or above     lenalidomide  (REVLIMID ) 5 MG capsule Take 1 capsule (5 mg total) by mouth daily. [employer] Auth # [id number]   Date Obtained 06/01/24 Take for 21 days then none for 7 days. 21 capsule 0   metoCLOPramide  (REGLAN ) 10 MG tablet Take 1 tablet (10 mg total) by mouth every 8 (eight) hours as needed for nausea. 30 tablet 1   montelukast  (SINGULAIR ) 10 MG tablet Take 10 mg by mouth daily.     Probiotic Product (ALIGN PO) Take by mouth daily.     simvastatin  (ZOCOR ) 20 MG tablet Take 20 mg by mouth every evening.     traMADol  (ULTRAM ) 50 MG tablet Take 1-2 tablets (50-100 mg total) by mouth every 4 (four) hours as needed. 90 tablet 0   No current facility-administered medications for this visit.    VITAL  SIGNS: There were no vitals taken for this visit. There were no vitals filed for this visit.  Estimated body mass index is 24.28 kg/m as calculated from the following:   Height as of 11/13/23: 5' 1 (1.549 m).   Weight as of 05/13/24: 128 lb 8 oz (58.3 kg).  Assessment NAD RRR Normal breathing pattern  AAO x3  PERFORMANCE STATUS (ECOG) : 2 - Symptomatic, <50% confined to bed  Discussed the use of AI scribe software for clinical note transcription with the patient, who gave verbal consent to proceed.  History of Present Illness Alice Anthony is an 83 year old female who was seen during her infusion for follow-up. She is accompanied by her daughter. Denies concerns for nausea, vomiting, or diarrhea. Occasional constipation controlled  with stool softeners. Appetite is good. Weight is stable at 128lbs. Continues to take things one day at a time. Ongoing fatigue however she tries to remain active as possible. Does require rest breaks.   She experiences significant back pain, particularly after walking up a steep driveway at her daughter's house over the weekend. The pain is severe enough to impact her ability to walk comfortably, and she describes her back as feeling weak during this activity.  She typically manages her pain with medication taken in the morning to alleviate discomfort. However, she does not take it around the clock. We discussed the goal of her being comfortable and medication being readily available as needed to prevent uncontrolled pain or distress. Her recent pain episode has left her quite uncomfortable for several days however she has not increased her use of pain medication. Her daughter expresses concerns of patient hurting and not medicating herself in this setting. Patient aware she can take her Tramadol  every 4 hours as needed 1-2 tablets pending level of pain severity.   The recent incident was the first time she felt stressed due to her back pain, as she had been more active than usual that day. She wants to continue being active and participating in family activities despite the pain.  All questions answered and support provided.   Goals of Care 5/20- We discussed her current illness and what it means in the larger context of her on-going co-morbidities. Natural disease trajectory and expectations were discussed.   Patient and daughter are realistic in their understanding of current illness. Is remaining hopeful for stability and improve quality of life.   We discussed Her current illness and what it means in the larger context of Her on-going co-morbidities. Natural disease trajectory and expectations were discussed.  I discussed the importance of continued conversation with family and their medical  providers regarding overall plan of care and treatment options, ensuring decisions are within the context of the patients values and GOCs. Assessment & Plan Pain management with Tramadol  and Tylenol  Chronic pain managed with as needed tramadol .  Recent pain crisis exacerbated by physical activity, particularly walking uphill. Pain is severe enough to require increased medication use. No acute injury reported, but possible muscle strain or pinching suspected. Pain management is situational and not taken around the clock. Generally requiring once a day.   - Educated on pain management and comfort with safety in mid. - Continue Tramadol  50-100mg  every 4 hours PRN for moderate-severe pain. - Encouraged patient take medication with her when outside of the house in case of pain episode.  - We will continue to closely monitor and adjust regimen as needed.  Constipation due to medication Constipation likely from  medication. Managed with medication balance, but bowel habits vary.  Follow-up Follow-up in 3-4 weeks.  Sooner if needed.   Patient expressed understanding and was in agreement with this plan. She also understands that She can call the clinic at any time with any questions, concerns, or complaints.   Any controlled substances utilized were prescribed in the context of palliative care. PDMP has been reviewed.   Visit consisted of counseling and education dealing with the complex and emotionally intense issues of symptom management and palliative care in the setting of serious and potentially life-threatening illness.  Levon Borer, AGPCNP-BC  Palliative Medicine Team/[facility]

## 2024-06-10 ENCOUNTER — Inpatient Hospital Stay

## 2024-06-10 ENCOUNTER — Ambulatory Visit

## 2024-06-10 ENCOUNTER — Other Ambulatory Visit

## 2024-06-10 ENCOUNTER — Ambulatory Visit: Admitting: Hematology and Oncology

## 2024-06-10 LAB — KAPPA/LAMBDA LIGHT CHAINS
Kappa free light chain: 23.5 mg/L — ABNORMAL HIGH (ref 3.3–19.4)
Kappa, lambda light chain ratio: 6.71 — ABNORMAL HIGH (ref 0.26–1.65)
Lambda free light chains: 3.5 mg/L — ABNORMAL LOW (ref 5.7–26.3)

## 2024-06-10 LAB — T4: T4, Total: 8.2 ug/dL (ref 4.5–12.0)

## 2024-06-13 ENCOUNTER — Other Ambulatory Visit: Payer: Self-pay | Admitting: Hematology and Oncology

## 2024-06-14 ENCOUNTER — Encounter: Payer: Self-pay | Admitting: Hematology and Oncology

## 2024-06-15 LAB — MULTIPLE MYELOMA PANEL, SERUM
Albumin SerPl Elph-Mcnc: 3.9 g/dL (ref 2.9–4.4)
Albumin/Glob SerPl: 1.7 (ref 0.7–1.7)
Alpha 1: 0.3 g/dL (ref 0.0–0.4)
Alpha2 Glob SerPl Elph-Mcnc: 0.8 g/dL (ref 0.4–1.0)
B-Globulin SerPl Elph-Mcnc: 0.9 g/dL (ref 0.7–1.3)
Gamma Glob SerPl Elph-Mcnc: 0.4 g/dL (ref 0.4–1.8)
Globulin, Total: 2.4 g/dL (ref 2.2–3.9)
IgA: 24 mg/dL — ABNORMAL LOW (ref 64–422)
IgG (Immunoglobin G), Serum: 352 mg/dL — ABNORMAL LOW (ref 586–1602)
IgM (Immunoglobulin M), Srm: 21 mg/dL — ABNORMAL LOW (ref 26–217)
M Protein SerPl Elph-Mcnc: 0.1 g/dL — ABNORMAL HIGH
Total Protein ELP: 6.3 g/dL (ref 6.0–8.5)

## 2024-06-23 DIAGNOSIS — E119 Type 2 diabetes mellitus without complications: Secondary | ICD-10-CM | POA: Diagnosis not present

## 2024-06-23 DIAGNOSIS — H43813 Vitreous degeneration, bilateral: Secondary | ICD-10-CM | POA: Diagnosis not present

## 2024-06-23 DIAGNOSIS — D3131 Benign neoplasm of right choroid: Secondary | ICD-10-CM | POA: Diagnosis not present

## 2024-06-25 ENCOUNTER — Other Ambulatory Visit: Payer: Self-pay | Admitting: Hematology and Oncology

## 2024-06-26 ENCOUNTER — Other Ambulatory Visit: Payer: Self-pay | Admitting: Hematology and Oncology

## 2024-07-05 NOTE — Progress Notes (Unsigned)
 [facility] Telephone:(336) [phone]   Fax:([phone]  PROGRESS NOTE  Patient Care Team: Verena Mems, MD as PCP - General (Family Medicine) Pickenpack-Cousar, Fannie SAILOR, NP as Nurse Practitioner (Nurse Practitioner)  Hematological/Oncological History # Plasmacytoma of Thoracic Spine  # IgG Kappa Multiple myeloma 09/22/2022: Patient underwent MRI thoracic spine which showed acute or subacute T7, T10, and T11 compression fractures 10/10/2022: biopsy of compression fracture of T11 reveals a plasmacytoma 11/13/2022: establish care with Dr. Federico  12/06/2022: bone marrow biopsy showed plasma cell myeloma involving 80% of the marrow.  12/24/2022: Cycle 1 Day 1 of Velcade /Dex 01/14/2023: Cycle 2 Day 1 of Velcade /Dex 02/04/2023: Cycle 3 Day 1 of Velcade /Dex 02/26/2023: Cycle 4 Day 1 of Velcade /Dex HELD due to ocular toxicity 03/04/2023: Cycle 1 Day 1 of Dara/Dex  04/01/2023: Cycle 2 Day 1 of Dara/Dex  04/29/2023: Cycle 3 Day 1 of Dara/Dex  05/27/2023: Cycle 4 Day 1 of Dara/Dex  06/24/2023: Cycle 5 Day 1 of Dara/Dex  07/23/2023: Cycle 6 Day 1 of Dara/Rev/Dex  08/20/2023: Cycle 7 Day 1 of Dara/Rev/Dex  09/18/2023 Cycle 8 Day 1 of Dara/Rev/Dex  10/16/2023: Cycle 9 Day 1 of Dara/Rev/Dex  11/13/2023: Cycle 10 Day 1 of Dara/Rev/Dex  12/11/2023: Cycle 11 Day 1 of Dara/Rev/Dex  01/08/2024: Cycle 12 Day 1 of Dara/Rev/Dex   Interval History:  Alice Anthony 83 y.o. female with medical history significant for newly diagnosed IgG kappa multiple myeloma who presents for a follow up visit. The patient's last visit was on 06/09/2024.  In the interim since the last visit she has continued on Dara/Rev/Dex therapy.   On exam today [name] is accompanied by her daughter.  She reports she had a great Thanksgiving with ham and turkey.  She gained about 1 pound during of that holiday.  She reports that she also recently had a ham biscuit and had facial swelling.  She reports that ever the wise  everything has been steady.  She has had a major changes in her health and reports her energy levels are good.  Her appetite remains strong.  She notes that she has not had any trouble with runny nose, sore throat, cough.  She denies any fevers, chills, sweats.  She tolerating her Darzalex  shots well with no major side effects.  Overall she feels well and has no other questions concerns or complaints today.  A full 10 point ROS is otherwise negative.   MEDICAL HISTORY:  Past Medical History:  Diagnosis Date   Anxiety    Arthritis    Asthma    Hx: of   Cancer (HCC)    Hx: of skin cancer   Diverticulosis    GERD (gastroesophageal reflux disease)    Hx: of   History of kidney stones    Hyperlipemia    Hypertension    Pre-diabetes     SURGICAL HISTORY: Past Surgical History:  Procedure Laterality Date   ABDOMINAL SURGERY  09/19/2012   Ruptured Diverticuli, Ostomy Placed.   BREAST BIOPSY  06/06/2012   breast biopsy   CATARACT EXTRACTION W/ INTRAOCULAR LENS IMPLANT Bilateral    COLONOSCOPY  03/06/2012   COLOSTOMY CLOSURE  12/25/2012   Dr Vanderbilt   COLOSTOMY CLOSURE N/A 12/25/2012   Procedure: COLOSTOMY CLOSURE;  Surgeon: [name], MD;  Location: [facility] OR;  Service: General;  Laterality: N/A;   EXTRACORPOREAL SHOCK WAVE LITHOTRIPSY Left 11/17/2018   Procedure: EXTRACORPOREAL SHOCK WAVE LITHOTRIPSY (ESWL);  Surgeon: Sherrilee Belvie CROME, MD;  Location: [facility]S;  Service: Urology;  Laterality: Left;   EYE SURGERY  10/19/1998   Hx: of cyst removed from left eye   KYPHOPLASTY N/A 10/10/2022   Procedure: KYPHOPLASTY Thoracic seven , Thoracic ten,Thoracic eleven;  Surgeon: [name], MD;  Location: [facility] OR;  Service: Neurosurgery;  Laterality: N/A;   LAPAROTOMY N/A 09/19/2012   Procedure: EXPLORATORY LAPAROTOMY, sigmoid colectomy;  Surgeon: [name], MD;  Location: [facility];  Service: General;  Laterality: N/A;   SIGMOIDOSCOPY  01/11/1999   TONSILLECTOMY     TRIGGER  FINGER RELEASE  12/25/2010   TUBAL LIGATION  10/16/1985    SOCIAL HISTORY: Social History   Socioeconomic History   Marital status: Widowed    Spouse name: Not on file   Number of children: Not on file   Years of education: Not on file   Highest education level: Not on file  Occupational History   Not on file  Tobacco Use   Smoking status: Never   Smokeless tobacco: Never  Vaping Use   Vaping status: Never Used  Substance and Sexual Activity   Alcohol use: No   Drug use: No   Sexual activity: Not Currently  Other Topics Concern   Not on file  Social History Narrative   Not on file   Social Drivers of Health   Financial Resource Strain: Not on file  Food Insecurity: Not on file  Transportation Needs: Not on file  Physical Activity: Not on file  Stress: Not on file  Social Connections: Not on file  Intimate Partner Violence: Not on file    FAMILY HISTORY: Family History  Problem Relation Age of Onset   Diabetes Father    Alzheimer's disease Father    Basal cell carcinoma Mother        Metastatic    ALLERGIES:  is allergic to hydrocodone , vioxx [rofecoxib], codeine, doxycycline hyclate, latex, and revlimid  [lenalidomide ].  MEDICATIONS:  Current Outpatient Medications  Medication Sig Dispense Refill   acyclovir  (ZOVIRAX ) 400 MG tablet Take 1 tablet by mouth twice daily 180 tablet 0   ALPRAZolam  (XANAX ) 0.25 MG tablet Take 0.25 mg by mouth 3 (three) times daily as needed.     amLODipine  (NORVASC ) 5 MG tablet Take 5 mg by mouth daily.     aspirin  81 MG tablet Take 81 mg by mouth daily.     cetirizine (ZYRTEC) 10 MG tablet Take 10 mg by mouth daily.     Cholecalciferol (VITAMIN D3) 2000 UNITS capsule Take 2,000 Units by mouth daily.     cycloSPORINE (RESTASIS) 0.05 % ophthalmic emulsion 1 drop 2 (two) times daily.     dexamethasone  (DECADRON ) 4 MG tablet Take 40 mg (10 tablets) by mouth once a week. 40 tablet 3   dextromethorphan-guaiFENesin  (TUSSIN DM) 10-100  MG/5ML liquid Take 10 mLs by mouth every 4 (four) hours as needed for cough.     fluticasone  (FLONASE ) 50 MCG/ACT nasal spray Place 2 sprays into the nose at bedtime.     hydrOXYzine  (ATARAX ) 25 MG tablet Take 25 mg by mouth at bedtime.     Insulin  Regular Human (NOVOLIN R FLEXPEN IJ) Inject 10 Units as directed as needed. 10 units when blood sugar is 200 or above     lenalidomide  (REVLIMID ) 5 MG capsule TAKE 1 CAPSULE BY MOUTH 1 TIME A DAY FOR 21 DAYS ON THEN 7 DAYS OFF 21 capsule 0   metoCLOPramide  (REGLAN ) 10 MG tablet Take 1 tablet (10 mg total) by mouth every 8 (eight) hours  as needed for nausea. 30 tablet 1   montelukast  (SINGULAIR ) 10 MG tablet Take 1 tablet (10 mg total) by mouth daily. 30 tablet 2   Probiotic Product (ALIGN PO) Take by mouth daily.     simvastatin  (ZOCOR ) 20 MG tablet Take 20 mg by mouth every evening.     traMADol  (ULTRAM ) 50 MG tablet Take 1-2 tablets (50-100 mg total) by mouth every 4 (four) hours as needed. 90 tablet 0   No current facility-administered medications for this visit.   Facility-Administered Medications Ordered in Other Visits  Medication Dose Route Frequency Provider Last Rate Last Admin   0.9 %  sodium chloride  infusion   Intravenous Continuous Federico Norleen ONEIDA MADISON, MD   Stopped at 07/06/24 1606    REVIEW OF SYSTEMS:   Constitutional: ( - ) fevers, ( - )  chills , ( - ) night sweats Eyes: ( - ) blurriness of vision, ( - ) double vision, ( - ) watery eyes Ears, nose, mouth, throat, and face: ( - ) mucositis, ( - ) sore throat Respiratory: ( - ) cough, ( - ) dyspnea, ( - ) wheezes Cardiovascular: ( - ) palpitation, ( - ) chest discomfort, ( - ) lower extremity swelling Gastrointestinal:  ( - ) nausea, ( - ) heartburn, ( - ) change in bowel habits Skin: ( - ) abnormal skin rashes Lymphatics: ( - ) new lymphadenopathy, ( - ) easy bruising Neurological: ( - ) numbness, ( - ) tingling, ( - ) new weaknesses Behavioral/Psych: ( - ) mood change, ( - ) new  changes  All other systems were reviewed with the patient and are negative.  PHYSICAL EXAMINATION: ECOG PERFORMANCE STATUS: 1 - Symptomatic but completely ambulatory  There were no vitals filed for this visit.  There were no vitals filed for this visit.   GENERAL: Well-appearing elderly Caucasian female, alert, no distress and comfortable SKIN: skin color, texture, turgor are normal, no rashes or significant lesions EYES: conjunctiva are pink and non-injected, sclera clear.  LUNGS: clear to auscultation and percussion with normal breathing effort HEART: regular rate & rhythm and no murmurs and no lower extremity edema Musculoskeletal: no cyanosis of digits and no clubbing  PSYCH: alert & oriented x 3, fluent speech NEURO: no focal motor/sensory deficits  LABORATORY DATA:  I have reviewed the data as listed    Latest Ref Rng & Units 07/06/2024    1:45 PM 06/09/2024   10:50 AM 05/13/2024   11:10 AM  CBC  WBC 4.0 - 10.5 K/uL 4.9  4.4  4.6   Hemoglobin 12.0 - 15.0 g/dL 84.9  84.7  85.2   Hematocrit 36.0 - 46.0 % 42.3  43.4  41.8   Platelets 150 - 400 K/uL 148  154  165        Latest Ref Rng & Units 07/06/2024    1:45 PM 06/09/2024   10:50 AM 05/13/2024   11:10 AM  CMP  Glucose 70 - 99 mg/dL 812  863  801   BUN 8 - 23 mg/dL 12  14  13    Creatinine 0.44 - 1.00 mg/dL 9.13  9.19  9.26   Sodium 135 - 145 mmol/L 141  141  141   Potassium 3.5 - 5.1 mmol/L 3.5  4.2  4.2   Chloride 98 - 111 mmol/L 105  106  108   CO2 22 - 32 mmol/L 25  28  28    Calcium  8.9 - 10.3 mg/dL 9.3  9.2  9.4   Total Protein 6.5 - 8.1 g/dL 6.4  6.7  6.4   Total Bilirubin 0.0 - 1.2 mg/dL 0.4  0.6  0.6   Alkaline Phos 38 - 126 U/L 68  56  68   AST 15 - 41 U/L 23  18  17    ALT 0 - 44 U/L 22  17  16      Lab Results  Component Value Date   MPROTEIN 0.1 (H) 06/09/2024   MPROTEIN 0.1 (H) 05/13/2024   MPROTEIN 0.1 (H) 04/13/2024   Lab Results  Component Value Date   KPAFRELGTCHN 23.5 (H) 06/09/2024    KPAFRELGTCHN 27.8 (H) 05/13/2024   KPAFRELGTCHN 32.4 (H) 04/13/2024   LAMBDASER 3.5 (L) 06/09/2024   LAMBDASER 3.9 (L) 05/13/2024   LAMBDASER 2.5 (L) 04/13/2024   KAPLAMBRATIO 6.71 (H) 06/09/2024   KAPLAMBRATIO 7.13 (H) 05/13/2024   KAPLAMBRATIO 12.96 (H) 04/13/2024   Lab Results  Component Value Date   MPROTEIN 0.1 (H) 06/09/2024   MPROTEIN 0.1 (H) 05/13/2024   MPROTEIN 0.1 (H) 04/13/2024   Lab Results  Component Value Date   KPAFRELGTCHN 23.5 (H) 06/09/2024   KPAFRELGTCHN 27.8 (H) 05/13/2024   KPAFRELGTCHN 32.4 (H) 04/13/2024   LAMBDASER 3.5 (L) 06/09/2024   LAMBDASER 3.9 (L) 05/13/2024   LAMBDASER 2.5 (L) 04/13/2024   KAPLAMBRATIO 6.71 (H) 06/09/2024   KAPLAMBRATIO 7.13 (H) 05/13/2024   KAPLAMBRATIO 12.96 (H) 04/13/2024     RADIOGRAPHIC STUDIES: No results found.  ASSESSMENT & PLAN Alice Anthony is a 83 y.o.  female with medical history significant for IgG kappa multiple myeloma who presents for a follow up visit.   # IgG Kappa Multiple Myeloma # Plasmacytoma of Spine  -- Patient diagnosed with plasmacytoma of the spine, subsequent bone marrow biopsy confirmed an IgG kappa multiple myeloma involving the bone marrow --start of Velcade /Dex Cycle 1 Day 1 on 12/24/2022. --Due to ocular toxicity, recommended to discontinue Velcade  therapy and switch to Daratumumab .  --Started Revlimid  on 07/17/2023 at 10 mg PO daily. Dose reduced to 5 mg PO daily on 10/30/2023 due to rash.  Plan: --Due for Cycle 18, Day 1 of Dara/Rev/Dex --Tolerating lower dose of Revlimid  without any new toxicities. (5 mg PO daily x 21 days with 7 days off) --Labs today show WBC 4.9, Hgb 15.0, MCV 95.1, Plt 148.  Creatinine and LFTs are normal.  --Last M protein 0.1 on 06/09/2024 with kappa 23.5, lambda 3.5, ratio 6.71  -- Return to clinic in 4 weeks with continued Dara/Rev/Dex.    #Ocular toxicity/Stye --Secondary to Velcade  therapy --Used Moxifloxacin  drops with improvement  #Supportive Care --  chemotherapy education complete -- port placement not required   -- zofran  8mg  q8H PRN and compazine  10mg  PO q6H for nausea -- acyclovir  400mg  PO BID for VCZ prophylaxis -- Recieved dental clearance prior to the start of Zometa . First dose on 01/07/2023. Last dose on 04/13/2024. Next dose in Dec 2025.  -- Tramadol  50 mg every 6 hours as needed for pain control.  Currently taking 2 daily.  No orders of the defined types were placed in this encounter.  All questions were answered. The patient knows to call the clinic with any problems, questions or concerns.  I have spent a total of 30 minutes minutes of face-to-face and non-face-to-face time, preparing to see the patient, performing a medically appropriate examination, counseling and educating the patient,  documenting clinical information in the electronic health record,and care coordination.   Norleen IVAR Kidney, MD  Department of Hematology/Oncology [facility] Phone: [phone] Pager: [phone] Email: [email or web address]   07/06/2024 4:35 PM

## 2024-07-06 ENCOUNTER — Inpatient Hospital Stay (HOSPITAL_BASED_OUTPATIENT_CLINIC_OR_DEPARTMENT_OTHER): Admitting: Nurse Practitioner

## 2024-07-06 ENCOUNTER — Inpatient Hospital Stay

## 2024-07-06 ENCOUNTER — Inpatient Hospital Stay: Admitting: Hematology and Oncology

## 2024-07-06 ENCOUNTER — Inpatient Hospital Stay: Attending: Hematology and Oncology

## 2024-07-06 VITALS — BP 156/71 | HR 70 | Temp 98.0°F | Resp 16 | Ht 61.0 in | Wt 128.8 lb

## 2024-07-06 DIAGNOSIS — G893 Neoplasm related pain (acute) (chronic): Secondary | ICD-10-CM

## 2024-07-06 DIAGNOSIS — C9000 Multiple myeloma not having achieved remission: Secondary | ICD-10-CM

## 2024-07-06 DIAGNOSIS — R530 Neoplastic (malignant) related fatigue: Secondary | ICD-10-CM | POA: Diagnosis not present

## 2024-07-06 DIAGNOSIS — Z5112 Encounter for antineoplastic immunotherapy: Secondary | ICD-10-CM | POA: Insufficient documentation

## 2024-07-06 DIAGNOSIS — Z79899 Other long term (current) drug therapy: Secondary | ICD-10-CM | POA: Insufficient documentation

## 2024-07-06 DIAGNOSIS — Z515 Encounter for palliative care: Secondary | ICD-10-CM

## 2024-07-06 LAB — CMP (CANCER CENTER ONLY)
ALT: 22 U/L (ref 0–44)
AST: 23 U/L (ref 15–41)
Albumin: 4.5 g/dL (ref 3.5–5.0)
Alkaline Phosphatase: 68 U/L (ref 38–126)
Anion gap: 11 (ref 5–15)
BUN: 12 mg/dL (ref 8–23)
CO2: 25 mmol/L (ref 22–32)
Calcium: 9.3 mg/dL (ref 8.9–10.3)
Chloride: 105 mmol/L (ref 98–111)
Creatinine: 0.86 mg/dL (ref 0.44–1.00)
GFR, Estimated: >60 mL/min (ref >60)
Glucose, Bld: 187 mg/dL — ABNORMAL HIGH (ref 70–99)
Potassium: 3.5 mmol/L (ref 3.5–5.1)
Sodium: 141 mmol/L (ref 135–145)
Total Bilirubin: 0.4 mg/dL (ref 0.0–1.2)
Total Protein: 6.4 g/dL — ABNORMAL LOW (ref 6.5–8.1)

## 2024-07-06 LAB — CBC WITH DIFFERENTIAL (CANCER CENTER ONLY)
Abs Immature Granulocytes: 0.01 K/uL (ref 0.00–0.07)
Basophils Absolute: 0 K/uL (ref 0.0–0.1)
Basophils Relative: 1 %
Eosinophils Absolute: 0 K/uL (ref 0.0–0.5)
Eosinophils Relative: 0 %
HCT: 42.3 % (ref 36.0–46.0)
Hemoglobin: 15 g/dL (ref 12.0–15.0)
Immature Granulocytes: 0 %
Lymphocytes Relative: 10 %
Lymphs Abs: 0.5 K/uL — ABNORMAL LOW (ref 0.7–4.0)
MCH: 33.7 pg (ref 26.0–34.0)
MCHC: 35.5 g/dL (ref 30.0–36.0)
MCV: 95.1 fL (ref 80.0–100.0)
Monocytes Absolute: 0.1 K/uL (ref 0.1–1.0)
Monocytes Relative: 2 %
Neutro Abs: 4.3 K/uL (ref 1.7–7.7)
Neutrophils Relative %: 87 %
Platelet Count: 148 K/uL — ABNORMAL LOW (ref 150–400)
RBC: 4.45 MIL/uL (ref 3.87–5.11)
RDW: 13.2 % (ref 11.5–15.5)
WBC Count: 4.9 K/uL (ref 4.0–10.5)
nRBC: 0 % (ref 0.0–0.2)

## 2024-07-06 MED ORDER — ZOLEDRONIC ACID 4 MG/5ML IV CONC
3.3000 mg | Freq: Once | INTRAVENOUS | Status: AC
  Administered 2024-07-06: 3.3 mg via INTRAVENOUS
  Filled 2024-07-06: qty 4.13

## 2024-07-06 MED ORDER — MONTELUKAST SODIUM 10 MG PO TABS: 10.0000 mg | ORAL_TABLET | Freq: Every day | ORAL | 2 refills | Status: AC

## 2024-07-06 MED ORDER — DARATUMUMAB-HYALURONIDASE-FIHJ 1800-30000 MG-UT/15ML ~~LOC~~ SOLN
1800.0000 mg | Freq: Once | SUBCUTANEOUS | Status: AC
  Administered 2024-07-06: 1800 mg via SUBCUTANEOUS
  Filled 2024-07-06: qty 15

## 2024-07-06 MED ORDER — DEXAMETHASONE 4 MG PO TABS: ORAL_TABLET | ORAL | 3 refills | Status: AC

## 2024-07-06 MED ORDER — SODIUM CHLORIDE 0.9 % IV SOLN: INTRAVENOUS | Status: DC

## 2024-07-06 NOTE — Patient Instructions (Signed)
 [facility] - A DEPT OF [facility]  Discharge Instructions: Thank you for choosing [facility] to provide your oncology and hematology care.   If you have a lab appointment with the Cancer Center, please go directly to the Cancer Center and check in at the registration area.   Wear comfortable clothing and clothing appropriate for easy access to any Portacath or PICC line.   We strive to give you quality time with your provider. You may need to reschedule your appointment if you arrive late (15 or more minutes).  Arriving late affects you and other patients whose appointments are after yours.  Also, if you miss three or more appointments without notifying the office, you may be dismissed from the clinic at the provider's discretion.      For prescription refill requests, have your pharmacy contact our office and allow 72 hours for refills to be completed.    Today you received the following chemotherapy and/or immunotherapy agents zometa  and darzalex  faspro      To help prevent nausea and vomiting after your treatment, we encourage you to take your nausea medication as directed.  BELOW ARE SYMPTOMS THAT SHOULD BE REPORTED IMMEDIATELY: *FEVER GREATER THAN 100.4 F (38 C) OR HIGHER *CHILLS OR SWEATING *NAUSEA AND VOMITING THAT IS NOT CONTROLLED WITH YOUR NAUSEA MEDICATION *UNUSUAL SHORTNESS OF BREATH *UNUSUAL BRUISING OR BLEEDING *URINARY PROBLEMS (pain or burning when urinating, or frequent urination) *BOWEL PROBLEMS (unusual diarrhea, constipation, pain near the anus) TENDERNESS IN MOUTH AND THROAT WITH OR WITHOUT PRESENCE OF ULCERS (sore throat, sores in mouth, or a toothache) UNUSUAL RASH, SWELLING OR PAIN  UNUSUAL VAGINAL DISCHARGE OR ITCHING   Items with * indicate a potential emergency and should be followed up as soon as possible or go to the Emergency Department if any problems should occur.  Please show the CHEMOTHERAPY ALERT CARD  or IMMUNOTHERAPY ALERT CARD at check-in to the Emergency Department and triage nurse.  Should you have questions after your visit or need to cancel or reschedule your appointment, please contact [facility] - A DEPT OF [facility]  Dept: [phone]  and follow the prompts.  Office hours are 8:00 a.m. to 4:30 p.m. Monday - Friday. Please note that voicemails left after 4:00 p.m. may not be returned until the following business day.  We are closed weekends and major holidays. You have access to a nurse at all times for urgent questions. Please call the main number to the clinic Dept: [phone] and follow the prompts.   For any non-urgent questions, you may also contact your provider using MyChart. We now offer e-Visits for anyone 47 and older to request care online for non-urgent symptoms. For details visit [email or web address].   Also download the MyChart app! Go to the app store, search MyChart, open the app, select [place], and log in with your MyChart username and password.

## 2024-07-06 NOTE — Progress Notes (Unsigned)
 Palliative Medicine [facility]  Telephone:(336) [phone] Fax:(336) [phone]   Name: Alice Anthony Date: 07/06/2024 MRN: [id number]  DOB: [birth year]  Patient Care Team: Verena Mems, MD as PCP - General (Family Medicine) Pickenpack-Cousar, Fannie SAILOR, NP as Nurse Practitioner (Nurse Practitioner)   INTERVAL HISTORY: CAROLIE MCILRATH is a 83 y.o. female with oncologic medical history including recent diagnosis of multiple myeloma (12/2022), DM, and posterior vitreous detachment of left and right eyes. Palliative ask to see for symptom management and goals of care   SOCIAL HISTORY:     reports that she has never smoked. She has never used smokeless tobacco. She reports that she does not drink alcohol and does not use drugs.  ADVANCE DIRECTIVES:  Advanced directives on file  CODE STATUS: Full code  PAST MEDICAL HISTORY: Past Medical History:  Diagnosis Date   Anxiety    Arthritis    Asthma    Hx: of   Cancer (HCC)    Hx: of skin cancer   Diverticulosis    GERD (gastroesophageal reflux disease)    Hx: of   History of kidney stones    Hyperlipemia    Hypertension    Pre-diabetes     ALLERGIES:  is allergic to hydrocodone , vioxx [rofecoxib], codeine, doxycycline hyclate, latex, and revlimid  [lenalidomide ].  MEDICATIONS:  Current Outpatient Medications  Medication Sig Dispense Refill   acyclovir  (ZOVIRAX ) 400 MG tablet Take 1 tablet by mouth twice daily 180 tablet 0   ALPRAZolam  (XANAX ) 0.25 MG tablet Take 0.25 mg by mouth 3 (three) times daily as needed.     amLODipine  (NORVASC ) 5 MG tablet Take 5 mg by mouth daily.     aspirin  81 MG tablet Take 81 mg by mouth daily.     cetirizine (ZYRTEC) 10 MG tablet Take 10 mg by mouth daily.     Cholecalciferol (VITAMIN D3) 2000 UNITS capsule Take 2,000 Units by mouth daily.     cycloSPORINE (RESTASIS) 0.05 % ophthalmic emulsion 1 drop 2 (two) times daily.     dexamethasone  (DECADRON ) 4 MG tablet Take 40 mg (10  tablets) by mouth once a week. 40 tablet 3   dextromethorphan-guaiFENesin  (TUSSIN DM) 10-100 MG/5ML liquid Take 10 mLs by mouth every 4 (four) hours as needed for cough.     fluticasone  (FLONASE ) 50 MCG/ACT nasal spray Place 2 sprays into the nose at bedtime.     hydrOXYzine  (ATARAX ) 25 MG tablet Take 25 mg by mouth at bedtime.     Insulin  Regular Human (NOVOLIN R FLEXPEN IJ) Inject 10 Units as directed as needed. 10 units when blood sugar is 200 or above     lenalidomide  (REVLIMID ) 5 MG capsule TAKE 1 CAPSULE BY MOUTH 1 TIME A DAY FOR 21 DAYS ON THEN 7 DAYS OFF 21 capsule 0   metoCLOPramide  (REGLAN ) 10 MG tablet Take 1 tablet (10 mg total) by mouth every 8 (eight) hours as needed for nausea. 30 tablet 1   montelukast  (SINGULAIR ) 10 MG tablet Take 1 tablet (10 mg total) by mouth daily. 30 tablet 2   Probiotic Product (ALIGN PO) Take by mouth daily.     simvastatin  (ZOCOR ) 20 MG tablet Take 20 mg by mouth every evening.     traMADol  (ULTRAM ) 50 MG tablet Take 1-2 tablets (50-100 mg total) by mouth every 4 (four) hours as needed. 90 tablet 0   No current facility-administered medications for this visit.    VITAL SIGNS: There were no vitals taken  for this visit. There were no vitals filed for this visit.  Estimated body mass index is 24.33 kg/m as calculated from the following:   Height as of an earlier encounter on 07/06/24: 5' 1 (1.549 m).   Weight as of an earlier encounter on 07/06/24: 128 lb 12 oz (58.4 kg).  Assessment NAD RRR Normal breathing pattern  AAO x3  PERFORMANCE STATUS (ECOG) : 2 - Symptomatic, <50% confined to bed  Discussed the use of AI scribe software for clinical note transcription with the patient, who gave verbal consent to proceed.  History of Present Illness Boluwatife Mutchler is an 83 year old female who was seen during her infusion for follow-up. She is accompanied by her daughter. No acute distress. Denies concerns for nausea, vomiting, or diarrhea.  Occasional constipation controlled with stool softeners. Appetite is good. Weight is stable at 128lbs. Continues to take things one day at a time. Recently enjoyed her Thanksgiving holiday with family.   Ms. Hoge reports her pain is controlled on current regimen. She is not taking around the clock. Will continue Tramadol  as needed for her pain. No adjustments at this time.   All questions answered and support provided.   Goals of Care 5/20- We discussed her current illness and what it means in the larger context of her on-going co-morbidities. Natural disease trajectory and expectations were discussed.   Patient and daughter are realistic in their understanding of current illness. Is remaining hopeful for stability and improve quality of life.   We discussed Her current illness and what it means in the larger context of Her on-going co-morbidities. Natural disease trajectory and expectations were discussed.  I discussed the importance of continued conversation with family and their medical providers regarding overall plan of care and treatment options, ensuring decisions are within the context of the patients values and GOCs. Assessment & Plan Pain management with Tramadol  and Tylenol  Chronic pain managed with as needed tramadol .  Pain management is situational and not taken around the clock. Generally requiring once a day.   - Educated on pain management and comfort with safety in mid. - Continue Tramadol  50-100mg  every 4 hours PRN for moderate-severe pain. - Encouraged patient take medication with her when outside of the house in case of pain episode.  - We will continue to closely monitor and adjust regimen as needed.  Constipation due to medication Constipation likely from medication. Managed with medication balance, but bowel habits vary.  Follow-up Follow-up in 3-4 weeks.  Sooner if needed.   Patient expressed understanding and was in agreement with this plan. She also  understands that She can call the clinic at any time with any questions, concerns, or complaints.   Any controlled substances utilized were prescribed in the context of palliative care. PDMP has been reviewed.   Visit consisted of counseling and education dealing with the complex and emotionally intense issues of symptom management and palliative care in the setting of serious and potentially life-threatening illness.  Levon Borer, AGPCNP-BC  Palliative Medicine Team/[facility]

## 2024-07-07 ENCOUNTER — Encounter: Payer: Self-pay | Admitting: Nurse Practitioner

## 2024-07-07 ENCOUNTER — Encounter: Payer: Self-pay | Admitting: Hematology and Oncology

## 2024-07-07 LAB — KAPPA/LAMBDA LIGHT CHAINS
Kappa free light chain: 19.9 mg/L — ABNORMAL HIGH (ref 3.3–19.4)
Kappa, lambda light chain ratio: 5.85 — ABNORMAL HIGH (ref 0.26–1.65)
Lambda free light chains: 3.4 mg/L — ABNORMAL LOW (ref 5.7–26.3)

## 2024-07-07 MED ORDER — TRAMADOL HCL 50 MG PO TABS: 50.0000 mg | ORAL_TABLET | ORAL | 0 refills | Status: AC | PRN

## 2024-07-08 ENCOUNTER — Inpatient Hospital Stay

## 2024-07-08 ENCOUNTER — Inpatient Hospital Stay: Admitting: Physician Assistant

## 2024-07-08 LAB — MULTIPLE MYELOMA PANEL, SERUM
Albumin SerPl Elph-Mcnc: 3.8 g/dL (ref 2.9–4.4)
Albumin/Glob SerPl: 2 — ABNORMAL HIGH (ref 0.7–1.7)
Alpha 1: 0.2 g/dL (ref 0.0–0.4)
Alpha2 Glob SerPl Elph-Mcnc: 0.7 g/dL (ref 0.4–1.0)
B-Globulin SerPl Elph-Mcnc: 0.8 g/dL (ref 0.7–1.3)
Gamma Glob SerPl Elph-Mcnc: 0.3 g/dL — ABNORMAL LOW (ref 0.4–1.8)
Globulin, Total: 2 g/dL — ABNORMAL LOW (ref 2.2–3.9)
IgA: 25 mg/dL — ABNORMAL LOW (ref 64–422)
IgG (Immunoglobin G), Serum: 364 mg/dL — ABNORMAL LOW (ref 586–1602)
IgM (Immunoglobulin M), Srm: 20 mg/dL — ABNORMAL LOW (ref 26–217)
M Protein SerPl Elph-Mcnc: 0.1 g/dL — ABNORMAL HIGH
Total Protein ELP: 5.8 g/dL — ABNORMAL LOW (ref 6.0–8.5)

## 2024-07-14 ENCOUNTER — Telehealth: Payer: Self-pay

## 2024-07-14 NOTE — Telephone Encounter (Signed)
 TC returned to patient. Pt requested her primary care provider on My Chart be changed to Dr Alm Heads, MD ,( phone [phone]) as her former PCP has moved out of the country. Change implemented on patient's profile.  Pt also requested labs from last visit faxed to Dr Heads. Confirmed fax number with Dr Coretta office and lab results from 07/06/24 successfully faxed.

## 2024-07-22 ENCOUNTER — Other Ambulatory Visit: Payer: Self-pay | Admitting: *Deleted

## 2024-07-22 ENCOUNTER — Other Ambulatory Visit: Payer: Self-pay | Admitting: Hematology and Oncology

## 2024-07-22 MED ORDER — LENALIDOMIDE 5 MG PO CAPS: 5.0000 mg | ORAL_CAPSULE | Freq: Every day | ORAL | 0 refills | Status: DC

## 2024-08-05 ENCOUNTER — Inpatient Hospital Stay

## 2024-08-05 ENCOUNTER — Inpatient Hospital Stay: Admitting: Hematology and Oncology

## 2024-08-11 NOTE — Progress Notes (Unsigned)
 " [facility] [phone]   [phone]  PROGRESS NOTE  Patient Care Team: Remonia Alm PARAS, MD as PCP - General (Family Medicine) Pickenpack-Cousar, Fannie SAILOR, NP as Nurse Practitioner (Nurse Practitioner)  Hematological/Oncological History # Plasmacytoma of Thoracic Spine  # IgG Kappa Multiple myeloma 09/22/2022: Patient underwent MRI thoracic spine which showed acute or subacute T7, T10, and T11 compression fractures 10/10/2022: biopsy of compression fracture of T11 reveals a plasmacytoma 11/13/2022: establish care with Dr. Federico  12/06/2022: bone marrow biopsy showed plasma cell myeloma involving 80% of the marrow.  12/24/2022: Cycle 1 Day 1 of Velcade /Dex 01/14/2023: Cycle 2 Day 1 of Velcade /Dex 02/04/2023: Cycle 3 Day 1 of Velcade /Dex 02/26/2023: Cycle 4 Day 1 of Velcade /Dex HELD due to ocular toxicity 03/04/2023: Cycle 1 Day 1 of Dara/Dex  04/01/2023: Cycle 2 Day 1 of Dara/Dex  04/29/2023: Cycle 3 Day 1 of Dara/Dex  05/27/2023: Cycle 4 Day 1 of Dara/Dex  06/24/2023: Cycle 5 Day 1 of Dara/Dex  07/23/2023: Cycle 6 Day 1 of Dara/Rev/Dex  08/20/2023: Cycle 7 Day 1 of Dara/Rev/Dex  09/18/2023 Cycle 8 Day 1 of Dara/Rev/Dex  10/16/2023: Cycle 9 Day 1 of Dara/Rev/Dex  11/13/2023: Cycle 10 Day 1 of Dara/Rev/Dex  12/11/2023: Cycle 11 Day 1 of Dara/Rev/Dex  01/08/2024: Cycle 12 Day 1 of Dara/Rev/Dex   Interval History:  Alice Anthony 84 y.o. female with medical history significant for newly diagnosed IgG kappa multiple myeloma who presents for a follow up visit. The patient's last visit was on 07/06/2024.  In the interim since the last visit she has continued on Dara/Rev/Dex therapy.   On exam today Alice Anthony is accompanied by her daughter today.  She reports that this year has been so far so good.  She reports that after she takes her lenalidomide  she passes out about 15 minutes later.  She like to take it before bed because it helps make her very sleepy.  She  reports other than that her energy levels and appetite are strong.  She reports she has a little bump on her pinky finger is unsure how it developed.  She reports is not itchy or painful.  She reports that she has had no issues with runny nose, sore throat, cough.  She denies any fevers, chills, sweats.  She was seen previously by palliative care who refilled her Reglan .  Overall she reports she feels well and is tolerating her treatment without difficulty.  She is willing and able to continue this time.  A full 10 point ROS is otherwise negative.   MEDICAL HISTORY:  Past Medical History:  Diagnosis Date   Anxiety    Arthritis    Asthma    Hx: of   Cancer (HCC)    Hx: of skin cancer   Diverticulosis    GERD (gastroesophageal reflux disease)    Hx: of   History of kidney stones    Hyperlipemia    Hypertension    Pre-diabetes     SURGICAL HISTORY: Past Surgical History:  Procedure Laterality Date   ABDOMINAL SURGERY  09/19/2012   Ruptured Diverticuli, Ostomy Placed.   BREAST BIOPSY  06/06/2012   breast biopsy   CATARACT EXTRACTION W/ INTRAOCULAR LENS IMPLANT Bilateral    COLONOSCOPY  03/06/2012   COLOSTOMY CLOSURE  12/25/2012   Dr Vanderbilt   COLOSTOMY CLOSURE N/A 12/25/2012   Procedure: COLOSTOMY CLOSURE;  Surgeon: [name], MD;  Location: [facility] OR;  Service: General;  Laterality: N/A;   EXTRACORPOREAL  SHOCK WAVE LITHOTRIPSY Left 11/17/2018   Procedure: EXTRACORPOREAL SHOCK WAVE LITHOTRIPSY (ESWL);  Surgeon: Sherrilee Belvie CROME, MD;  Location: [facility];  Service: Urology;  Laterality: Left;   EYE SURGERY  10/19/1998   Hx: of cyst removed from left eye   KYPHOPLASTY N/A 10/10/2022   Procedure: KYPHOPLASTY Thoracic seven , Thoracic ten,Thoracic eleven;  Surgeon: [name], MD;  Location: [facility] OR;  Service: Neurosurgery;  Laterality: N/A;   LAPAROTOMY N/A 09/19/2012   Procedure: EXPLORATORY LAPAROTOMY, sigmoid colectomy;  Surgeon: [name], MD;  Location: [facility];   Service: General;  Laterality: N/A;   SIGMOIDOSCOPY  01/11/1999   TONSILLECTOMY     TRIGGER FINGER RELEASE  12/25/2010   TUBAL LIGATION  10/16/1985    SOCIAL HISTORY: Social History   Socioeconomic History   Marital status: Widowed    Spouse name: Not on file   Number of children: Not on file   Years of education: Not on file   Highest education level: Not on file  Occupational History   Not on file  Tobacco Use   Smoking status: Never   Smokeless tobacco: Never  Vaping Use   Vaping status: Never Used  Substance and Sexual Activity   Alcohol use: No   Drug use: No   Sexual activity: Not Currently  Other Topics Concern   Not on file  Social History Narrative   Not on file   Social Drivers of Health   Tobacco Use: Low Risk (08/12/2024)   Patient History    Smoking Tobacco Use: Never    Smokeless Tobacco Use: Never    Passive Exposure: Not on file  Financial Resource Strain: Not on file  Food Insecurity: Not on file  Transportation Needs: Not on file  Physical Activity: Not on file  Stress: Not on file  Social Connections: Not on file  Intimate Partner Violence: Not on file  Depression (PHQ2-9): Low Risk (07/06/2024)   Depression (PHQ2-9)    PHQ-2 Score: 0  Alcohol Screen: Not on file  Housing: Not on file  Utilities: Not on file  Health Literacy: Not on file    FAMILY HISTORY: Family History  Problem Relation Age of Onset   Diabetes Father    Alzheimer's disease Father    Basal cell carcinoma Mother        Metastatic    ALLERGIES:  is allergic to hydrocodone , vioxx [rofecoxib], codeine, doxycycline hyclate, latex, and revlimid  [lenalidomide ].  MEDICATIONS:  Current Outpatient Medications  Medication Sig Dispense Refill   acyclovir  (ZOVIRAX ) 400 MG tablet Take 1 tablet by mouth twice daily 180 tablet 0   ALPRAZolam  (XANAX ) 0.25 MG tablet Take 0.25 mg by mouth 3 (three) times daily as needed.     amLODipine  (NORVASC ) 5 MG tablet Take 5 mg by mouth  daily.     aspirin  81 MG tablet Take 81 mg by mouth daily.     cetirizine (ZYRTEC) 10 MG tablet Take 10 mg by mouth daily.     Cholecalciferol (VITAMIN D3) 2000 UNITS capsule Take 2,000 Units by mouth daily.     cycloSPORINE (RESTASIS) 0.05 % ophthalmic emulsion 1 drop 2 (two) times daily.     dexamethasone  (DECADRON ) 4 MG tablet Take 40 mg (10 tablets) by mouth once a week. 40 tablet 3   dextromethorphan-guaiFENesin  (TUSSIN DM) 10-100 MG/5ML liquid Take 10 mLs by mouth every 4 (four) hours as needed for cough.     fluticasone  (FLONASE ) 50 MCG/ACT nasal spray Place 2 sprays into the  nose at bedtime.     hydrOXYzine  (ATARAX ) 25 MG tablet Take 25 mg by mouth at bedtime.     Insulin  Regular Human (NOVOLIN R FLEXPEN IJ) Inject 10 Units as directed as needed. 10 units when blood sugar is 200 or above     lenalidomide  (REVLIMID ) 5 MG capsule Take 1 capsule (5 mg total) by mouth daily. Auth #  [id number]    Date Obtained 07/22/2024 Take 1 capsule daily x 21 days then none x 7 days 21 capsule 0   metoCLOPramide  (REGLAN ) 10 MG tablet Take 1 tablet (10 mg total) by mouth every 8 (eight) hours as needed for nausea. 30 tablet 1   montelukast  (SINGULAIR ) 10 MG tablet Take 1 tablet (10 mg total) by mouth daily. 30 tablet 2   Probiotic Product (ALIGN PO) Take by mouth daily.     simvastatin  (ZOCOR ) 20 MG tablet Take 20 mg by mouth every evening.     traMADol  (ULTRAM ) 50 MG tablet Take 1-2 tablets (50-100 mg total) by mouth every 4 (four) hours as needed. 90 tablet 0   No current facility-administered medications for this visit.    REVIEW OF SYSTEMS:   Constitutional: ( - ) fevers, ( - )  chills , ( - ) night sweats Eyes: ( - ) blurriness of vision, ( - ) double vision, ( - ) watery eyes Ears, nose, mouth, throat, and face: ( - ) mucositis, ( - ) sore throat Respiratory: ( - ) cough, ( - ) dyspnea, ( - ) wheezes Cardiovascular: ( - ) palpitation, ( - ) chest discomfort, ( - ) lower extremity  swelling Gastrointestinal:  ( - ) nausea, ( - ) heartburn, ( - ) change in bowel habits Skin: ( - ) abnormal skin rashes Lymphatics: ( - ) new lymphadenopathy, ( - ) easy bruising Neurological: ( - ) numbness, ( - ) tingling, ( - ) new weaknesses Behavioral/Psych: ( - ) mood change, ( - ) new changes  All other systems were reviewed with the patient and are negative.  PHYSICAL EXAMINATION: ECOG PERFORMANCE STATUS: 1 - Symptomatic but completely ambulatory  Vitals:   08/12/24 1158  BP: (!) 152/80  Pulse: 94  Resp: 16  Temp: 97.6 F (36.4 C)  SpO2: 97%    Filed Weights   08/12/24 1158  Weight: 128 lb 14.4 oz (58.5 kg)     GENERAL: Well-appearing elderly Caucasian female, alert, no distress and comfortable SKIN: skin color, texture, turgor are normal, no rashes or significant lesions EYES: conjunctiva are pink and non-injected, sclera clear.  LUNGS: clear to auscultation and percussion with normal breathing effort HEART: regular rate & rhythm and no murmurs and no lower extremity edema Musculoskeletal: no cyanosis of digits and no clubbing  PSYCH: alert & oriented x 3, fluent speech NEURO: no focal motor/sensory deficits  LABORATORY DATA:  I have reviewed the data as listed    Latest Ref Rng & Units 08/12/2024   11:26 AM 07/06/2024    1:45 PM 06/09/2024   10:50 AM  CBC  WBC 4.0 - 10.5 K/uL 5.0  4.9  4.4   Hemoglobin 12.0 - 15.0 g/dL 84.7  84.9  84.7   Hematocrit 36.0 - 46.0 % 41.9  42.3  43.4   Platelets 150 - 400 K/uL 146  148  154        Latest Ref Rng & Units 08/12/2024   11:26 AM 07/06/2024    1:45 PM 06/09/2024   10:50 AM  CMP  Glucose 70 - 99 mg/dL 756  812  863   BUN 8 - 23 mg/dL 14  12  14    Creatinine 0.44 - 1.00 mg/dL 9.18  9.13  9.19   Sodium 135 - 145 mmol/L 141  141  141   Potassium 3.5 - 5.1 mmol/L 4.2  3.5  4.2   Chloride 98 - 111 mmol/L 105  105  106   CO2 22 - 32 mmol/L 25  25  28    Calcium  8.9 - 10.3 mg/dL 9.1  9.3  9.2   Total Protein 6.5 - 8.1  g/dL 6.6  6.4  6.7   Total Bilirubin 0.0 - 1.2 mg/dL 0.6  0.4  0.6   Alkaline Phos 38 - 126 U/L 72  68  56   AST 15 - 41 U/L 24  23  18    ALT 0 - 44 U/L 22  22  17      Lab Results  Component Value Date   MPROTEIN 0.1 (H) 07/06/2024   MPROTEIN 0.1 (H) 06/09/2024   MPROTEIN 0.1 (H) 05/13/2024   Lab Results  Component Value Date   KPAFRELGTCHN 19.9 (H) 07/06/2024   KPAFRELGTCHN 23.5 (H) 06/09/2024   KPAFRELGTCHN 27.8 (H) 05/13/2024   LAMBDASER 3.4 (L) 07/06/2024   LAMBDASER 3.5 (L) 06/09/2024   LAMBDASER 3.9 (L) 05/13/2024   KAPLAMBRATIO 5.85 (H) 07/06/2024   KAPLAMBRATIO 6.71 (H) 06/09/2024   KAPLAMBRATIO 7.13 (H) 05/13/2024   Lab Results  Component Value Date   MPROTEIN 0.1 (H) 07/06/2024   MPROTEIN 0.1 (H) 06/09/2024   MPROTEIN 0.1 (H) 05/13/2024   Lab Results  Component Value Date   KPAFRELGTCHN 19.9 (H) 07/06/2024   KPAFRELGTCHN 23.5 (H) 06/09/2024   KPAFRELGTCHN 27.8 (H) 05/13/2024   LAMBDASER 3.4 (L) 07/06/2024   LAMBDASER 3.5 (L) 06/09/2024   LAMBDASER 3.9 (L) 05/13/2024   KAPLAMBRATIO 5.85 (H) 07/06/2024   KAPLAMBRATIO 6.71 (H) 06/09/2024   KAPLAMBRATIO 7.13 (H) 05/13/2024     RADIOGRAPHIC STUDIES: No results found.  ASSESSMENT & PLAN Alice Anthony is a 84 y.o.  female with medical history significant for IgG kappa multiple myeloma who presents for a follow up visit.   # IgG Kappa Multiple Myeloma # Plasmacytoma of Spine  -- Patient diagnosed with plasmacytoma of the spine, subsequent bone marrow biopsy confirmed an IgG kappa multiple myeloma involving the bone marrow --start of Velcade /Dex Cycle 1 Day 1 on 12/24/2022. --Due to ocular toxicity, recommended to discontinue Velcade  therapy and switch to Daratumumab .  --Started Revlimid  on 07/17/2023 at 10 mg PO daily. Dose reduced to 5 mg PO daily on 10/30/2023 due to rash.  Plan: --Due for Cycle 19, Day 1 of Dara/Rev/Dex --Tolerating lower dose of Revlimid  without any new toxicities. (5 mg PO daily x 21  days with 7 days off) --Labs today show WBC 5.0, Hgb 15.2, MCV 93.1, Plt 146. Creatinine and LFTs are normal.  --Last M protein 0.1 on 06/09/2024 with kappa 23.5, lambda 3.5, ratio 6.71  -- Return to clinic in 4 weeks with continued Dara/Rev/Dex.    #Ocular toxicity/Stye --Secondary to Velcade  therapy --Used Moxifloxacin  drops with improvement  #Supportive Care -- chemotherapy education complete -- port placement not required   -- zofran  8mg  q8H PRN and compazine  10mg  PO q6H for nausea -- acyclovir  400mg  PO BID for VCZ prophylaxis -- Recieved dental clearance prior to the start of Zometa . First dose on 01/07/2023. Last dose on 07/06/2024. Next dose in March 2026 -- Tramadol  50  mg every 6 hours as needed for pain control.  Currently taking 2 daily.  No orders of the defined types were placed in this encounter.  All questions were answered. The patient knows to call the clinic with any problems, questions or concerns.  I have spent a total of 30 minutes minutes of face-to-face and non-face-to-face time, preparing to see the patient, performing a medically appropriate examination, counseling and educating the patient,  documenting clinical information in the electronic health record,and care coordination.   Norleen IVAR Kidney, MD Department of Hematology/Oncology [facility] Phone: [phone] Pager: [phone] Email: [email or web address]   08/12/2024 8:22 PM "

## 2024-08-12 ENCOUNTER — Encounter: Payer: Self-pay | Admitting: Nurse Practitioner

## 2024-08-12 ENCOUNTER — Inpatient Hospital Stay: Attending: Hematology and Oncology

## 2024-08-12 ENCOUNTER — Inpatient Hospital Stay

## 2024-08-12 ENCOUNTER — Inpatient Hospital Stay: Admitting: Nurse Practitioner

## 2024-08-12 ENCOUNTER — Inpatient Hospital Stay: Admitting: Hematology and Oncology

## 2024-08-12 VITALS — Ht 59.0 in

## 2024-08-12 VITALS — BP 152/80 | HR 94 | Temp 97.6°F | Resp 16 | Ht 61.0 in | Wt 128.9 lb

## 2024-08-12 DIAGNOSIS — R530 Neoplastic (malignant) related fatigue: Secondary | ICD-10-CM

## 2024-08-12 DIAGNOSIS — Z79899 Other long term (current) drug therapy: Secondary | ICD-10-CM | POA: Insufficient documentation

## 2024-08-12 DIAGNOSIS — Z5112 Encounter for antineoplastic immunotherapy: Secondary | ICD-10-CM | POA: Diagnosis present

## 2024-08-12 DIAGNOSIS — C9000 Multiple myeloma not having achieved remission: Secondary | ICD-10-CM | POA: Diagnosis not present

## 2024-08-12 DIAGNOSIS — Z515 Encounter for palliative care: Secondary | ICD-10-CM

## 2024-08-12 DIAGNOSIS — G893 Neoplasm related pain (acute) (chronic): Secondary | ICD-10-CM | POA: Diagnosis not present

## 2024-08-12 DIAGNOSIS — R110 Nausea: Secondary | ICD-10-CM

## 2024-08-12 LAB — CBC WITH DIFFERENTIAL (CANCER CENTER ONLY)
Abs Immature Granulocytes: 0.01 K/uL (ref 0.00–0.07)
Basophils Absolute: 0.1 K/uL (ref 0.0–0.1)
Basophils Relative: 2 %
Eosinophils Absolute: 0 K/uL (ref 0.0–0.5)
Eosinophils Relative: 0 %
HCT: 41.9 % (ref 36.0–46.0)
Hemoglobin: 15.2 g/dL — ABNORMAL HIGH (ref 12.0–15.0)
Immature Granulocytes: 0 %
Lymphocytes Relative: 10 %
Lymphs Abs: 0.5 K/uL — ABNORMAL LOW (ref 0.7–4.0)
MCH: 33.8 pg (ref 26.0–34.0)
MCHC: 36.3 g/dL — ABNORMAL HIGH (ref 30.0–36.0)
MCV: 93.1 fL (ref 80.0–100.0)
Monocytes Absolute: 0.2 K/uL (ref 0.1–1.0)
Monocytes Relative: 3 %
Neutro Abs: 4.3 K/uL (ref 1.7–7.7)
Neutrophils Relative %: 85 %
Platelet Count: 146 K/uL — ABNORMAL LOW (ref 150–400)
RBC: 4.5 MIL/uL (ref 3.87–5.11)
RDW: 13.1 % (ref 11.5–15.5)
WBC Count: 5 K/uL (ref 4.0–10.5)
nRBC: 0 % (ref 0.0–0.2)

## 2024-08-12 LAB — CMP (CANCER CENTER ONLY)
ALT: 22 U/L (ref 0–44)
AST: 24 U/L (ref 15–41)
Albumin: 4.7 g/dL (ref 3.5–5.0)
Alkaline Phosphatase: 72 U/L (ref 38–126)
Anion gap: 11 (ref 5–15)
BUN: 14 mg/dL (ref 8–23)
CO2: 25 mmol/L (ref 22–32)
Calcium: 9.1 mg/dL (ref 8.9–10.3)
Chloride: 105 mmol/L (ref 98–111)
Creatinine: 0.81 mg/dL (ref 0.44–1.00)
GFR, Estimated: >60 mL/min
Glucose, Bld: 243 mg/dL — ABNORMAL HIGH (ref 70–99)
Potassium: 4.2 mmol/L (ref 3.5–5.1)
Sodium: 141 mmol/L (ref 135–145)
Total Bilirubin: 0.6 mg/dL (ref 0.0–1.2)
Total Protein: 6.6 g/dL (ref 6.5–8.1)

## 2024-08-12 MED ORDER — MONTELUKAST SODIUM 10 MG PO TABS
10.0000 mg | ORAL_TABLET | Freq: Once | ORAL | Status: DC
  Filled 2024-08-12: qty 1

## 2024-08-12 MED ORDER — DARATUMUMAB-HYALURONIDASE-FIHJ 1800-30000 MG-UT/15ML ~~LOC~~ SOLN
1800.0000 mg | Freq: Once | SUBCUTANEOUS | Status: AC
  Administered 2024-08-12: 1800 mg via SUBCUTANEOUS
  Filled 2024-08-12: qty 15

## 2024-08-12 MED ORDER — METOCLOPRAMIDE HCL 10 MG PO TABS: 10.0000 mg | ORAL_TABLET | Freq: Three times a day (TID) | ORAL | 1 refills | Status: AC | PRN

## 2024-08-12 MED ORDER — ACETAMINOPHEN 325 MG PO TABS
650.0000 mg | ORAL_TABLET | Freq: Once | ORAL | Status: DC
  Filled 2024-08-12: qty 2

## 2024-08-12 MED ORDER — DIPHENHYDRAMINE HCL 25 MG PO CAPS
25.0000 mg | ORAL_CAPSULE | Freq: Once | ORAL | Status: DC
  Filled 2024-08-12: qty 1

## 2024-08-12 NOTE — Patient Instructions (Signed)

## 2024-08-12 NOTE — Progress Notes (Signed)
 "    Palliative Medicine [facility]  [phone] [phone]   Name: Alice Anthony Date: 08/12/2024 MRN: [id number]  DOB: Nov 30, 1940  Patient Care Team: Remonia Alm PARAS, MD as PCP - General (Family Medicine) Pickenpack-Cousar, Fannie SAILOR, NP as Nurse Practitioner (Nurse Practitioner)   INTERVAL HISTORY: Alice Anthony is a 84 y.o. female with oncologic medical history including recent diagnosis of multiple myeloma (12/2022), DM, and posterior vitreous detachment of left and right eyes. Palliative ask to see for symptom management and goals of care   SOCIAL HISTORY:     reports that she has never smoked. She has never used smokeless tobacco. She reports that she does not drink alcohol and does not use drugs.  ADVANCE DIRECTIVES:  Advanced directives on file  CODE STATUS: Full code  PAST MEDICAL HISTORY: Past Medical History:  Diagnosis Date   Anxiety    Arthritis    Asthma    Hx: of   Cancer (HCC)    Hx: of skin cancer   Diverticulosis    GERD (gastroesophageal reflux disease)    Hx: of   History of kidney stones    Hyperlipemia    Hypertension    Pre-diabetes     ALLERGIES:  is allergic to hydrocodone , vioxx [rofecoxib], codeine, doxycycline hyclate, latex, and revlimid  [lenalidomide ].  MEDICATIONS:  Current Outpatient Medications  Medication Sig Dispense Refill   acyclovir  (ZOVIRAX ) 400 MG tablet Take 1 tablet by mouth twice daily 180 tablet 0   ALPRAZolam  (XANAX ) 0.25 MG tablet Take 0.25 mg by mouth 3 (three) times daily as needed.     amLODipine  (NORVASC ) 5 MG tablet Take 5 mg by mouth daily.     aspirin  81 MG tablet Take 81 mg by mouth daily.     cetirizine (ZYRTEC) 10 MG tablet Take 10 mg by mouth daily.     Cholecalciferol (VITAMIN D3) 2000 UNITS capsule Take 2,000 Units by mouth daily.     cycloSPORINE (RESTASIS) 0.05 % ophthalmic emulsion 1 drop 2 (two) times daily.     dexamethasone  (DECADRON ) 4 MG tablet Take 40 mg (10  tablets) by mouth once a week. 40 tablet 3   dextromethorphan-guaiFENesin  (TUSSIN DM) 10-100 MG/5ML liquid Take 10 mLs by mouth every 4 (four) hours as needed for cough.     fluticasone  (FLONASE ) 50 MCG/ACT nasal spray Place 2 sprays into the nose at bedtime.     hydrOXYzine  (ATARAX ) 25 MG tablet Take 25 mg by mouth at bedtime.     Insulin  Regular Human (NOVOLIN R FLEXPEN IJ) Inject 10 Units as directed as needed. 10 units when blood sugar is 200 or above     lenalidomide  (REVLIMID ) 5 MG capsule Take 1 capsule (5 mg total) by mouth daily. Auth #  [id number]    Date Obtained 07/22/2024 Take 1 capsule daily x 21 days then none x 7 days 21 capsule 0   metoCLOPramide  (REGLAN ) 10 MG tablet Take 1 tablet (10 mg total) by mouth every 8 (eight) hours as needed for nausea. 30 tablet 1   montelukast  (SINGULAIR ) 10 MG tablet Take 1 tablet (10 mg total) by mouth daily. 30 tablet 2   Probiotic Product (ALIGN PO) Take by mouth daily.     simvastatin  (ZOCOR ) 20 MG tablet Take 20 mg by mouth every evening.     traMADol  (ULTRAM ) 50 MG tablet Take 1-2 tablets (50-100 mg total) by mouth every 4 (four) hours as needed. 90 tablet 0   No  current facility-administered medications for this visit.   Facility-Administered Medications Ordered in Other Visits  Medication Dose Route Frequency Provider Last Rate Last Admin   acetaminophen  (TYLENOL ) tablet 650 mg  650 mg Oral Once Dorsey, John T IV, MD       diphenhydrAMINE  (BENADRYL ) capsule 25 mg  25 mg Oral Once Dorsey, John T IV, MD       montelukast  (SINGULAIR ) tablet 10 mg  10 mg Oral Once Dorsey, John T IV, MD        VITAL SIGNS: There were no vitals taken for this visit. There were no vitals filed for this visit.  Estimated body mass index is 26.03 kg/m as calculated from the following:   Height as of an earlier encounter on 08/12/24: 4' 11 (1.499 m).   Weight as of an earlier encounter on 08/12/24: 128 lb 14.4 oz (58.5 kg).  Assessment NAD RRR Normal breathing  pattern Small blister to right ring finger AAO x3  PERFORMANCE STATUS (ECOG) : 2 - Symptomatic, <50% confined to bed  Discussed the use of AI scribe software for clinical note transcription with the patient, who gave verbal consent to proceed.  History of Present Illness Alice Anthony is an 84 year old female with multiple myeloma who was seen during her infusion. She is accompanied by her daughter. No acute distress noted. Patient shares she enjoyed spending time with family over the recent holidays. She was able to cook a meal for her family of 9 although this did cause an increase in her discomfort and fatigue.   She experiences nausea, which she associates with family and holiday stress. She has not taken her nausea medication, Reglan , in a long time. She recalls taking 15 pills before a recent visit, including premedications like Singulair , Benadryl , Tylenol , and a steroid, as premedication to her treatment. She attributes some of her nausea to her large pill burden. No active vomiting. Feelings of nausea is intermittent and not daily. We discussed use of Reglan  as needed. We will send in a refill to her pharmacy to have on hand.   Regarding pain management, she takes tramadol  as needed, typically one or two tablets per day, but sometimes goes without it for extended periods. She is concerned about the medication affecting her liver, although she acknowledges it does not contain Tylenol . She does not always feel pain, but when she does, she tries to manage it by resting or reading, rather than taking medication. Her daughter expresses concerns of her mother's discomfort at times due to hesitancy of medication use. Education provided on use of Tramadol  as needed to aid in decrease in pain and sustainability of her quality of life as she speaks to limiting her ability to perform certain activities or follow-thru due to discomfort bringing her to a haut. Patient verbalizes understanding  and appreciation of all efforts to managing and lessening her symptom burden.   She mentions a past issue with a bump on her finger, which was not painful. Area to finger observed. Encouraged patient and daughter to continue monitoring with parameters of when to contact office.   All questions answered and support provided.   Goals of Care 5/20- We discussed her current illness and what it means in the larger context of her on-going co-morbidities. Natural disease trajectory and expectations were discussed.   Patient and daughter are realistic in their understanding of current illness. Is remaining hopeful for stability and improve quality of life.   We discussed Her  current illness and what it means in the larger context of Her on-going co-morbidities. Natural disease trajectory and expectations were discussed.  I discussed the importance of continued conversation with family and their medical providers regarding overall plan of care and treatment options, ensuring decisions are within the context of the patients values and GOCs. Assessment & Plan Cancer Related Pain Management Management is challenging due to concerns about medication side effects and efficacy. Tramadol  is prescribed but not consistently taken as directed. She reports that tramadol  does not provide significant relief and prefers not to take it at times despite pain. Pain is intermittent and not always present, and she manages it by resting or changing positions. Emphasis is placed on taking medication as prescribed to maximize its effectiveness and improve quality of life given reports of limited activity due to her pain crisis at times.  - Encouraged taking tramadol  as prescribed when in pain. - Discussed the importance of medication adherence to manage pain effectively. -Prescribed Tramadol  50-100mg  every 4-6 hours as needed for moderate to severe pain.   Nausea Intermittent nausea, possibly exacerbated by family stress and  medication use. Previous prescription for Reglan  was not taken due to misunderstanding of its use. Nausea may also be related to taking multiple medications and stress during the holidays. - Prescribed Reglan  10mg  every 8 hours as needed for nausea. - Educated on the use of Reglan  for nausea management.  Follow-Up I will see patient back in 3-4 weeks. Sooner if needed.    Patient expressed understanding and was in agreement with this plan. She also understands that She can call the clinic at any time with any questions, concerns, or complaints.   Any controlled substances utilized were prescribed in the context of palliative care. PDMP has been reviewed.   I personally spent a total of 45 minutes in the care of the patient today including preparing to see the patient, getting/reviewing separately obtained history, performing a medically appropriate exam/evaluation, counseling and educating, placing orders, and documenting clinical information in the EHR. Visit consisted of counseling and education dealing with the complex and emotionally intense issues of symptom management and palliative care in the setting of serious and potentially life-threatening illness.  Levon Borer, AGPCNP-BC  Palliative Medicine Team/[facility]  "

## 2024-08-13 LAB — KAPPA/LAMBDA LIGHT CHAINS
Kappa free light chain: 21.3 mg/L — ABNORMAL HIGH (ref 3.3–19.4)
Kappa, lambda light chain ratio: 6.87 — ABNORMAL HIGH (ref 0.26–1.65)
Lambda free light chains: 3.1 mg/L — ABNORMAL LOW (ref 5.7–26.3)

## 2024-08-14 LAB — MULTIPLE MYELOMA PANEL, SERUM
Albumin SerPl Elph-Mcnc: 3.9 g/dL (ref 2.9–4.4)
Albumin/Glob SerPl: 1.8 — ABNORMAL HIGH (ref 0.7–1.7)
Alpha 1: 0.2 g/dL (ref 0.0–0.4)
Alpha2 Glob SerPl Elph-Mcnc: 0.7 g/dL (ref 0.4–1.0)
B-Globulin SerPl Elph-Mcnc: 0.9 g/dL (ref 0.7–1.3)
Gamma Glob SerPl Elph-Mcnc: 0.4 g/dL (ref 0.4–1.8)
Globulin, Total: 2.2 g/dL (ref 2.2–3.9)
IgA: 23 mg/dL — ABNORMAL LOW (ref 64–422)
IgG (Immunoglobin G), Serum: 369 mg/dL — ABNORMAL LOW (ref 586–1602)
IgM (Immunoglobulin M), Srm: 19 mg/dL — ABNORMAL LOW (ref 26–217)
M Protein SerPl Elph-Mcnc: 0.1 g/dL — ABNORMAL HIGH
Total Protein ELP: 6.1 g/dL (ref 6.0–8.5)

## 2024-08-17 ENCOUNTER — Other Ambulatory Visit: Payer: Self-pay | Admitting: Hematology and Oncology

## 2024-08-17 ENCOUNTER — Other Ambulatory Visit: Payer: Self-pay | Admitting: *Deleted

## 2024-08-17 DIAGNOSIS — C9000 Multiple myeloma not having achieved remission: Secondary | ICD-10-CM

## 2024-08-17 MED ORDER — LENALIDOMIDE 5 MG PO CAPS: 5.0000 mg | ORAL_CAPSULE | Freq: Every day | ORAL | 0 refills | Status: AC

## 2024-08-28 ENCOUNTER — Telehealth: Payer: Self-pay

## 2024-08-28 NOTE — Telephone Encounter (Signed)
" °  [facility] is updated with info  [name], CPhT [place]  [facility] On[facility] Pharmacy Patient Advocate Specialist [facility] [place] Phone: [phone]  Fax: [phone] [email or web address]    "

## 2024-09-08 ENCOUNTER — Other Ambulatory Visit: Payer: Self-pay | Admitting: Hematology and Oncology

## 2024-09-08 ENCOUNTER — Telehealth: Payer: Self-pay

## 2024-09-09 ENCOUNTER — Inpatient Hospital Stay

## 2024-09-09 ENCOUNTER — Inpatient Hospital Stay: Admitting: Physician Assistant

## 2024-09-09 ENCOUNTER — Inpatient Hospital Stay: Admitting: Nurse Practitioner

## 2024-09-15 ENCOUNTER — Inpatient Hospital Stay: Attending: Hematology and Oncology

## 2024-09-15 ENCOUNTER — Inpatient Hospital Stay

## 2024-09-15 ENCOUNTER — Inpatient Hospital Stay: Admitting: Physician Assistant

## 2024-10-07 ENCOUNTER — Inpatient Hospital Stay

## 2024-10-07 ENCOUNTER — Inpatient Hospital Stay: Admitting: Hematology and Oncology

## 2024-10-07 ENCOUNTER — Inpatient Hospital Stay: Attending: Hematology and Oncology
